# Patient Record
Sex: Female | Born: 1940
Health system: Southern US, Community
[De-identification: ages and names within clinical notes are randomized; demographics above are authoritative.]

## PROBLEM LIST (undated history)

## (undated) DIAGNOSIS — Z860101 Personal history of adenomatous and serrated colon polyps: Secondary | ICD-10-CM

## (undated) DIAGNOSIS — K5010 Crohn's disease of large intestine without complications: Secondary | ICD-10-CM

## (undated) DIAGNOSIS — Z85820 Personal history of malignant melanoma of skin: Secondary | ICD-10-CM

## (undated) DIAGNOSIS — M1990 Unspecified osteoarthritis, unspecified site: Secondary | ICD-10-CM

## (undated) DIAGNOSIS — T148XXA Other injury of unspecified body region, initial encounter: Secondary | ICD-10-CM

## (undated) DIAGNOSIS — K859 Acute pancreatitis without necrosis or infection, unspecified: Secondary | ICD-10-CM

## (undated) DIAGNOSIS — K625 Hemorrhage of anus and rectum: Secondary | ICD-10-CM

## (undated) DIAGNOSIS — H269 Unspecified cataract: Secondary | ICD-10-CM

## (undated) DIAGNOSIS — I779 Disorder of arteries and arterioles, unspecified: Secondary | ICD-10-CM

## (undated) DIAGNOSIS — D126 Benign neoplasm of colon, unspecified: Secondary | ICD-10-CM

## (undated) DIAGNOSIS — I219 Acute myocardial infarction, unspecified: Secondary | ICD-10-CM

## (undated) DIAGNOSIS — F32A Depression, unspecified: Secondary | ICD-10-CM

## (undated) DIAGNOSIS — J449 Chronic obstructive pulmonary disease, unspecified: Secondary | ICD-10-CM

## (undated) DIAGNOSIS — F329 Major depressive disorder, single episode, unspecified: Secondary | ICD-10-CM

## (undated) DIAGNOSIS — I2510 Atherosclerotic heart disease of native coronary artery without angina pectoris: Secondary | ICD-10-CM

## (undated) DIAGNOSIS — Z86010 Personal history of colonic polyps: Secondary | ICD-10-CM

## (undated) DIAGNOSIS — G459 Transient cerebral ischemic attack, unspecified: Secondary | ICD-10-CM

## (undated) DIAGNOSIS — J338 Other polyp of sinus: Secondary | ICD-10-CM

## (undated) DIAGNOSIS — I10 Essential (primary) hypertension: Secondary | ICD-10-CM

## (undated) DIAGNOSIS — I82409 Acute embolism and thrombosis of unspecified deep veins of unspecified lower extremity: Secondary | ICD-10-CM

## (undated) DIAGNOSIS — Z8619 Personal history of other infectious and parasitic diseases: Secondary | ICD-10-CM

## (undated) DIAGNOSIS — Z8719 Personal history of other diseases of the digestive system: Secondary | ICD-10-CM

## (undated) DIAGNOSIS — M810 Age-related osteoporosis without current pathological fracture: Secondary | ICD-10-CM

## (undated) DIAGNOSIS — E782 Mixed hyperlipidemia: Secondary | ICD-10-CM

## (undated) DIAGNOSIS — K760 Fatty (change of) liver, not elsewhere classified: Secondary | ICD-10-CM

## (undated) DIAGNOSIS — F419 Anxiety disorder, unspecified: Secondary | ICD-10-CM

## (undated) DIAGNOSIS — I739 Peripheral vascular disease, unspecified: Secondary | ICD-10-CM

## (undated) HISTORY — DX: Age-related osteoporosis without current pathological fracture: M81.0

## (undated) HISTORY — DX: Personal history of adenomatous and serrated colon polyps: Z86.0101

## (undated) HISTORY — DX: Acute embolism and thrombosis of unspecified deep veins of unspecified lower extremity: I82.409

## (undated) HISTORY — DX: Essential (primary) hypertension: I10

## (undated) HISTORY — DX: Personal history of malignant melanoma of skin: Z85.820

## (undated) HISTORY — DX: Other injury of unspecified body region, initial encounter: T14.8XXA

## (undated) HISTORY — PX: CATARACT EXTRACTION: SUR2

## (undated) HISTORY — DX: Hemorrhage of anus and rectum: K62.5

## (undated) HISTORY — DX: Fatty (change of) liver, not elsewhere classified: K76.0

## (undated) HISTORY — DX: Chronic obstructive pulmonary disease, unspecified: J44.9

## (undated) HISTORY — DX: Acute myocardial infarction, unspecified: I21.9

## (undated) HISTORY — DX: Personal history of other diseases of the digestive system: Z87.19

## (undated) HISTORY — DX: Other polyp of sinus: J33.8

## (undated) HISTORY — DX: Mixed hyperlipidemia: E78.2

## (undated) HISTORY — DX: Transient cerebral ischemic attack, unspecified: G45.9

## (undated) HISTORY — DX: Atherosclerotic heart disease of native coronary artery without angina pectoris: I25.10

## (undated) HISTORY — DX: Acute pancreatitis without necrosis or infection, unspecified: K85.90

## (undated) HISTORY — DX: Personal history of other infectious and parasitic diseases: Z86.19

## (undated) HISTORY — DX: Major depressive disorder, single episode, unspecified: F32.9

## (undated) HISTORY — DX: Crohn's disease of large intestine without complications: K50.10

## (undated) HISTORY — DX: Unspecified osteoarthritis, unspecified site: M19.90

## (undated) HISTORY — DX: Personal history of colonic polyps: Z86.010

## (undated) HISTORY — DX: Depression, unspecified: F32.A

## (undated) HISTORY — DX: Anxiety disorder, unspecified: F41.9

## (undated) HISTORY — DX: Benign neoplasm of colon, unspecified: D12.6

## (undated) HISTORY — DX: Peripheral vascular disease, unspecified: I73.9

## (undated) HISTORY — DX: Unspecified cataract: H26.9

## (undated) HISTORY — DX: Disorder of arteries and arterioles, unspecified: I77.9

---

## 1994-08-26 HISTORY — PX: OTHER SURGICAL HISTORY: SHX169

## 2002-07-12 ENCOUNTER — Other Ambulatory Visit: Admission: RE | Admit: 2002-07-12 | Discharge: 2002-07-12 | Payer: Self-pay | Admitting: Obstetrics & Gynecology

## 2003-06-20 ENCOUNTER — Inpatient Hospital Stay (HOSPITAL_COMMUNITY): Admission: AD | Admit: 2003-06-20 | Discharge: 2003-06-25 | Payer: Self-pay | Admitting: Cardiology

## 2003-06-21 ENCOUNTER — Encounter: Payer: Self-pay | Admitting: Cardiology

## 2003-06-22 ENCOUNTER — Encounter: Payer: Self-pay | Admitting: Cardiology

## 2004-09-19 ENCOUNTER — Ambulatory Visit: Payer: Self-pay | Admitting: Cardiology

## 2004-10-03 ENCOUNTER — Ambulatory Visit: Payer: Self-pay | Admitting: Internal Medicine

## 2004-10-11 ENCOUNTER — Ambulatory Visit: Payer: Self-pay | Admitting: Internal Medicine

## 2004-10-26 ENCOUNTER — Encounter: Admission: RE | Admit: 2004-10-26 | Discharge: 2004-10-26 | Payer: Self-pay | Admitting: Orthopaedic Surgery

## 2004-11-04 ENCOUNTER — Encounter: Admission: RE | Admit: 2004-11-04 | Discharge: 2004-11-04 | Payer: Self-pay | Admitting: Orthopaedic Surgery

## 2005-03-25 ENCOUNTER — Ambulatory Visit: Payer: Self-pay | Admitting: Cardiology

## 2005-04-17 ENCOUNTER — Ambulatory Visit: Payer: Self-pay | Admitting: Internal Medicine

## 2005-04-23 ENCOUNTER — Ambulatory Visit: Payer: Self-pay | Admitting: Cardiology

## 2005-05-02 ENCOUNTER — Ambulatory Visit: Payer: Self-pay | Admitting: Cardiology

## 2005-05-03 ENCOUNTER — Ambulatory Visit: Payer: Self-pay | Admitting: Cardiology

## 2005-05-08 ENCOUNTER — Ambulatory Visit: Payer: Self-pay | Admitting: Internal Medicine

## 2005-05-15 ENCOUNTER — Ambulatory Visit: Payer: Self-pay | Admitting: Cardiology

## 2005-05-15 ENCOUNTER — Ambulatory Visit: Payer: Self-pay | Admitting: Internal Medicine

## 2005-07-12 ENCOUNTER — Ambulatory Visit: Payer: Self-pay | Admitting: Cardiology

## 2005-11-20 ENCOUNTER — Ambulatory Visit: Payer: Self-pay | Admitting: Internal Medicine

## 2005-11-28 ENCOUNTER — Ambulatory Visit: Payer: Self-pay | Admitting: Cardiology

## 2006-05-07 ENCOUNTER — Ambulatory Visit: Payer: Self-pay | Admitting: Internal Medicine

## 2006-06-20 ENCOUNTER — Ambulatory Visit: Payer: Self-pay | Admitting: Internal Medicine

## 2006-07-28 ENCOUNTER — Ambulatory Visit: Payer: Self-pay | Admitting: Cardiology

## 2006-07-30 ENCOUNTER — Ambulatory Visit: Payer: Self-pay | Admitting: Internal Medicine

## 2006-08-12 ENCOUNTER — Ambulatory Visit: Payer: Self-pay

## 2006-10-07 ENCOUNTER — Ambulatory Visit: Payer: Self-pay | Admitting: Internal Medicine

## 2006-11-04 ENCOUNTER — Ambulatory Visit: Payer: Self-pay | Admitting: Internal Medicine

## 2006-12-31 ENCOUNTER — Ambulatory Visit: Payer: Self-pay | Admitting: Vascular Surgery

## 2007-01-21 ENCOUNTER — Ambulatory Visit: Payer: Self-pay | Admitting: Internal Medicine

## 2007-05-19 ENCOUNTER — Ambulatory Visit: Payer: Self-pay | Admitting: Internal Medicine

## 2007-05-19 LAB — CONVERTED CEMR LAB
Basophils Absolute: 0 10*3/uL (ref 0.0–0.1)
Eosinophils Relative: 0.9 % (ref 0.0–5.0)
HCT: 34.5 % — ABNORMAL LOW (ref 36.0–46.0)
MCHC: 35.1 g/dL (ref 30.0–36.0)
Monocytes Absolute: 0.1 10*3/uL — ABNORMAL LOW (ref 0.2–0.7)
Monocytes Relative: 1 % — ABNORMAL LOW (ref 3.0–11.0)
Neutro Abs: 10.2 10*3/uL — ABNORMAL HIGH (ref 1.4–7.7)
Neutrophils Relative %: 85.5 % — ABNORMAL HIGH (ref 43.0–77.0)
RBC: 3.96 M/uL (ref 3.87–5.11)
WBC: 11.9 10*3/uL — ABNORMAL HIGH (ref 4.5–10.5)

## 2007-06-10 ENCOUNTER — Ambulatory Visit: Payer: Self-pay | Admitting: Internal Medicine

## 2007-07-09 ENCOUNTER — Encounter: Payer: Self-pay | Admitting: Internal Medicine

## 2007-07-09 ENCOUNTER — Ambulatory Visit: Payer: Self-pay | Admitting: Internal Medicine

## 2007-07-30 ENCOUNTER — Ambulatory Visit: Payer: Self-pay | Admitting: Cardiology

## 2007-07-30 ENCOUNTER — Encounter: Payer: Self-pay | Admitting: Cardiology

## 2007-08-12 ENCOUNTER — Ambulatory Visit: Payer: Self-pay | Admitting: Internal Medicine

## 2007-08-13 ENCOUNTER — Encounter: Payer: Self-pay | Admitting: Cardiology

## 2007-08-14 ENCOUNTER — Ambulatory Visit: Payer: Self-pay | Admitting: Cardiology

## 2007-09-03 ENCOUNTER — Ambulatory Visit: Payer: Self-pay | Admitting: Cardiology

## 2007-09-09 ENCOUNTER — Ambulatory Visit: Payer: Self-pay | Admitting: Vascular Surgery

## 2007-10-27 DIAGNOSIS — G459 Transient cerebral ischemic attack, unspecified: Secondary | ICD-10-CM | POA: Insufficient documentation

## 2007-11-02 ENCOUNTER — Ambulatory Visit: Payer: Self-pay | Admitting: Internal Medicine

## 2008-01-28 ENCOUNTER — Ambulatory Visit: Payer: Self-pay | Admitting: Internal Medicine

## 2008-02-29 ENCOUNTER — Ambulatory Visit: Payer: Self-pay | Admitting: Cardiology

## 2008-03-09 ENCOUNTER — Ambulatory Visit: Payer: Self-pay | Admitting: Vascular Surgery

## 2008-05-11 ENCOUNTER — Ambulatory Visit: Payer: Self-pay | Admitting: Internal Medicine

## 2008-05-17 ENCOUNTER — Telehealth: Payer: Self-pay | Admitting: Internal Medicine

## 2008-08-02 ENCOUNTER — Telehealth: Payer: Self-pay | Admitting: Internal Medicine

## 2008-08-04 ENCOUNTER — Telehealth: Payer: Self-pay | Admitting: Internal Medicine

## 2008-09-21 ENCOUNTER — Encounter: Payer: Self-pay | Admitting: Cardiology

## 2008-09-21 ENCOUNTER — Ambulatory Visit: Payer: Self-pay | Admitting: Vascular Surgery

## 2008-10-06 ENCOUNTER — Encounter: Payer: Self-pay | Admitting: Cardiology

## 2008-10-19 ENCOUNTER — Encounter: Admission: RE | Admit: 2008-10-19 | Discharge: 2008-10-19 | Payer: Self-pay | Admitting: Rheumatology

## 2008-10-19 ENCOUNTER — Encounter: Payer: Self-pay | Admitting: Cardiology

## 2008-10-31 ENCOUNTER — Ambulatory Visit: Payer: Self-pay | Admitting: Cardiology

## 2008-10-31 ENCOUNTER — Encounter: Payer: Self-pay | Admitting: Cardiology

## 2008-10-31 DIAGNOSIS — F17200 Nicotine dependence, unspecified, uncomplicated: Secondary | ICD-10-CM | POA: Insufficient documentation

## 2008-10-31 DIAGNOSIS — I6523 Occlusion and stenosis of bilateral carotid arteries: Secondary | ICD-10-CM | POA: Insufficient documentation

## 2008-10-31 DIAGNOSIS — E782 Mixed hyperlipidemia: Secondary | ICD-10-CM | POA: Insufficient documentation

## 2008-11-23 ENCOUNTER — Encounter: Payer: Self-pay | Admitting: Internal Medicine

## 2008-12-07 ENCOUNTER — Telehealth: Payer: Self-pay | Admitting: Internal Medicine

## 2008-12-26 ENCOUNTER — Ambulatory Visit: Payer: Self-pay | Admitting: Internal Medicine

## 2008-12-26 LAB — CONVERTED CEMR LAB
Lymphs Abs: 1.9 10*3/uL (ref 0.7–4.0)
Monocytes Relative: 8.7 % (ref 3.0–12.0)
Neutro Abs: 3.5 10*3/uL (ref 1.4–7.7)
Neutrophils Relative %: 56.7 % (ref 43.0–77.0)
Platelets: 231 10*3/uL (ref 150.0–400.0)

## 2009-03-22 ENCOUNTER — Ambulatory Visit: Payer: Self-pay | Admitting: Vascular Surgery

## 2009-03-22 ENCOUNTER — Encounter (INDEPENDENT_AMBULATORY_CARE_PROVIDER_SITE_OTHER): Payer: Self-pay | Admitting: *Deleted

## 2009-04-21 ENCOUNTER — Telehealth: Payer: Self-pay | Admitting: Internal Medicine

## 2009-05-02 ENCOUNTER — Telehealth: Payer: Self-pay | Admitting: Internal Medicine

## 2009-05-02 ENCOUNTER — Ambulatory Visit: Payer: Self-pay | Admitting: Gastroenterology

## 2009-05-02 ENCOUNTER — Encounter: Payer: Self-pay | Admitting: Physician Assistant

## 2009-05-02 DIAGNOSIS — J449 Chronic obstructive pulmonary disease, unspecified: Secondary | ICD-10-CM

## 2009-05-03 LAB — CONVERTED CEMR LAB
AST: 25 units/L (ref 0–37)
Albumin: 3.7 g/dL (ref 3.5–5.2)
Basophils Absolute: 0 10*3/uL (ref 0.0–0.1)
Basophils Relative: 0.1 % (ref 0.0–3.0)
Creatinine, Ser: 1.6 mg/dL — ABNORMAL HIGH (ref 0.4–1.2)
Eosinophils Absolute: 0 10*3/uL (ref 0.0–0.7)
Eosinophils Relative: 0.7 % (ref 0.0–5.0)
HCT: 44.8 % (ref 36.0–46.0)
Lymphocytes Relative: 15 % (ref 12.0–46.0)
Lymphs Abs: 1.1 10*3/uL (ref 0.7–4.0)
MCV: 96.2 fL (ref 78.0–100.0)
Platelets: 199 10*3/uL (ref 150.0–400.0)
Potassium: 3.7 meq/L (ref 3.5–5.1)
RBC: 4.65 M/uL (ref 3.87–5.11)
RDW: 13 % (ref 11.5–14.6)
Sodium: 134 meq/L — ABNORMAL LOW (ref 135–145)
Total Protein: 7.8 g/dL (ref 6.0–8.3)
WBC: 7 10*3/uL (ref 4.5–10.5)

## 2009-05-11 ENCOUNTER — Ambulatory Visit (HOSPITAL_COMMUNITY): Admission: RE | Admit: 2009-05-11 | Discharge: 2009-05-11 | Payer: Self-pay | Admitting: Ophthalmology

## 2009-05-15 ENCOUNTER — Ambulatory Visit: Payer: Self-pay | Admitting: Internal Medicine

## 2009-06-07 ENCOUNTER — Ambulatory Visit: Payer: Self-pay | Admitting: Cardiology

## 2009-06-07 DIAGNOSIS — I2510 Atherosclerotic heart disease of native coronary artery without angina pectoris: Secondary | ICD-10-CM

## 2009-06-08 ENCOUNTER — Ambulatory Visit (HOSPITAL_COMMUNITY): Admission: RE | Admit: 2009-06-08 | Discharge: 2009-06-08 | Payer: Self-pay | Admitting: Ophthalmology

## 2009-06-20 ENCOUNTER — Encounter: Payer: Self-pay | Admitting: Cardiology

## 2009-06-26 ENCOUNTER — Encounter (INDEPENDENT_AMBULATORY_CARE_PROVIDER_SITE_OTHER): Payer: Self-pay | Admitting: *Deleted

## 2009-09-18 ENCOUNTER — Ambulatory Visit: Payer: Self-pay | Admitting: Vascular Surgery

## 2009-09-18 ENCOUNTER — Telehealth: Payer: Self-pay | Admitting: Internal Medicine

## 2009-10-04 ENCOUNTER — Telehealth (INDEPENDENT_AMBULATORY_CARE_PROVIDER_SITE_OTHER): Payer: Self-pay | Admitting: *Deleted

## 2009-11-13 ENCOUNTER — Telehealth (INDEPENDENT_AMBULATORY_CARE_PROVIDER_SITE_OTHER): Payer: Self-pay | Admitting: *Deleted

## 2009-11-30 ENCOUNTER — Encounter: Payer: Self-pay | Admitting: Cardiology

## 2009-12-05 ENCOUNTER — Encounter (INDEPENDENT_AMBULATORY_CARE_PROVIDER_SITE_OTHER): Payer: Self-pay | Admitting: *Deleted

## 2009-12-06 ENCOUNTER — Ambulatory Visit: Payer: Self-pay | Admitting: Cardiology

## 2009-12-26 ENCOUNTER — Telehealth: Payer: Self-pay | Admitting: Internal Medicine

## 2010-04-25 ENCOUNTER — Ambulatory Visit: Payer: Self-pay | Admitting: Vascular Surgery

## 2010-04-26 ENCOUNTER — Encounter: Payer: Self-pay | Admitting: Cardiology

## 2010-05-02 ENCOUNTER — Telehealth: Payer: Self-pay | Admitting: Internal Medicine

## 2010-05-04 ENCOUNTER — Encounter (INDEPENDENT_AMBULATORY_CARE_PROVIDER_SITE_OTHER): Payer: Self-pay | Admitting: *Deleted

## 2010-05-31 ENCOUNTER — Encounter: Payer: Self-pay | Admitting: Cardiology

## 2010-06-05 ENCOUNTER — Encounter (INDEPENDENT_AMBULATORY_CARE_PROVIDER_SITE_OTHER): Payer: Self-pay | Admitting: *Deleted

## 2010-06-05 ENCOUNTER — Ambulatory Visit: Payer: Self-pay | Admitting: Cardiology

## 2010-06-07 ENCOUNTER — Encounter: Payer: Self-pay | Admitting: Cardiology

## 2010-06-07 ENCOUNTER — Ambulatory Visit: Payer: Self-pay | Admitting: Cardiology

## 2010-06-13 ENCOUNTER — Ambulatory Visit: Payer: Self-pay | Admitting: Internal Medicine

## 2010-06-28 ENCOUNTER — Ambulatory Visit: Payer: Self-pay | Admitting: Cardiology

## 2010-08-01 ENCOUNTER — Telehealth: Payer: Self-pay | Admitting: Internal Medicine

## 2010-09-26 ENCOUNTER — Encounter: Payer: Self-pay | Admitting: Cardiology

## 2010-09-26 HISTORY — PX: TOTAL KNEE ARTHROPLASTY: SHX125

## 2010-09-27 NOTE — Assessment & Plan Note (Signed)
Summary: f/u//med refills--ch.    History of Present Illness Visit Type: Follow-up Visit Primary GI MD: Delfin Edis MD Primary Provider: Dr. Matthias Hughs Requesting Provider: n/a Chief Complaint: F/u for crohn's/colitis. Pt denies any GI complaints  History of Present Illness:   This is a 70 year old white female with Crohn's disease since 1998 involving her colon. Her last colonoscopy in November 2008 showed colitis and an adenomatous polyp in the left colon. She had gastroenteritis in September 2010 which responded to Flagyl and Cipro. Patient is allergic to 6 MP. She has coronary artery disease on  Plavix. She also has internal carotid disease. She denies any diarrhea, abdominal pain or rectal bleeding. She is currently on sulfasalazine 500 mg 3 tablets twice a day and folic acid 1 mg a day. She takes Robinul Forte 2 mg p.r.n. cramps.   GI Review of Systems      Denies abdominal pain, acid reflux, belching, bloating, chest pain, dysphagia with liquids, dysphagia with solids, heartburn, loss of appetite, nausea, vomiting, vomiting blood, weight loss, and  weight gain.        Denies anal fissure, black tarry stools, change in bowel habit, constipation, diarrhea, diverticulosis, fecal incontinence, heme positive stool, hemorrhoids, irritable bowel syndrome, jaundice, light color stool, liver problems, rectal bleeding, and  rectal pain.    Current Medications (verified): 1)  Aspirin 81 Mg  Tbec (Aspirin) .... .qdtab 2)  Plavix 75 Mg  Tabs (Clopidogrel Bisulfate) .... Take 1 Tablet By Mouth Once A Day 3)  Toprol Xl 25 Mg  Tb24 (Metoprolol Succinate) .... Take 1 Tablet By Mouth Once A Day 4)  Crestor 5 Mg Tabs (Rosuvastatin Calcium) .... One Tablet By Mouth Once Daily 5)  Folic Acid 1 Mg Tabs (Folic Acid) .... Take 1 Tablet By Mouth Once A Day. 6)  Fluoxetine Hcl 40 Mg Caps (Fluoxetine Hcl) .... One Tablet By Mouth Once Daily 7)  Caltrate 600+d Plus 600-400 Mg-Unit  Chew (Calcium  Carbonate-Vit D-Min) .... Once Daily 8)  Vitamin B-6 100 Mg  Tabs (Pyridoxine Hcl) .... Once Daily 9)  Sulfasalazine 500 Mg Tbec (Sulfasalazine) .... Take 3 Tablets By Mouth Two Times A Day (Pharmacy-Please D/c Prescription For Asacol) 10)  Hydrocodone-Acetaminophen 10-650 Mg Tabs (Hydrocodone-Acetaminophen) .... 2-4 By Mouth Once Daily 11)  Fish Oil Double Strength 1200 Mg Caps (Omega-3 Fatty Acids) .... Take 1 Tablet By Mouth Once A Day 12)  Celebrex 200 Mg Caps (Celecoxib) .... Take 1 Tablet By Mouth Once A Day As Needed Bad Days Only 13)  Alprazolam 0.5 Mg Tabs (Alprazolam) .... Take 1/2-1 Tablet By Mouth At Bedtime As Needed 14)  Osteo Bi-Flex Adv Triple St  Tabs (Misc Natural Products) .... Take 1 Tablet By Mouth Once A Day 15)  Nitrostat 0.4 Mg Subl (Nitroglycerin) .Marland Kitchen.. 1 Tablet Under Tongue At Onset of Chest Pain; You May Repeat Every 5 Minutes For Up To 3 Doses. 16)  Robinul-Forte 2 Mg Tabs (Glycopyrrolate) .... As Needed  Allergies (verified): 1)  ! * Mercaptopurine 2)  ! Penicillin  Past History:  Past Medical History: Osteoarthritis Sinus polyp C O P D Colonic polyps Hyperlipidemia Carotid disease - 77-41% RICA and LICA (2/87), Dr. Oneida Alar Myocardial Infarction - IMI 10/04 SALMONELLA INFECTION (ICD-003.9) CROHN'S DISEASE-LARGE INTESTINE (ICD-555.1) RECTAL BLEEDING (ICD-569.3) DIARRHEA (ICD-787.91) ABDOMINAL PAIN -GENERALIZED (ICD-789.07) NAUSEA ALONE (ICD-787.02) TOBACCO ABUSE (ICD-305.1) CAROTID ARTERY DISEASE (ICD-433.10) Hx of CVA (STROKE) (ICD-434.91) PANCREATITIS, ACUTE, HX OF (ICD-V12.70) COLONIC POLYPS (ICD-211.3) FATTY LIVER DISEASE (ICD-571.8) RECTAL BLEEDING (ICD-569.3) CAD (ICD-414.00)  POLYARTHRALGIA (ICD-719.49) APHTHOUS ULCERS (ICD-528.2) COLITIS (ICD-558.9) OSTEOARTHRITIS (ICD-715.90) TIA (ICD-435.9) Hypertension  Past Surgical History: Reviewed history from 12/06/2009 and no changes required. Melanoma resection - 1996 Cataract Surgery - left  Eye  Family History: Reviewed history from 12/06/2009 and no changes required. Family History of Diabetes: Mother No FH of Colon Cancer  Social History: Retired Alcohol Use - no Daily Caffeine Use Tobacco Use - Yes.   Review of Systems       The patient complains of arthritis/joint pain.  The patient denies allergy/sinus, anemia, anxiety-new, back pain, blood in urine, breast changes/lumps, change in vision, confusion, cough, coughing up blood, depression-new, fainting, fatigue, fever, headaches-new, hearing problems, heart murmur, heart rhythm changes, itching, menstrual pain, muscle pains/cramps, night sweats, nosebleeds, pregnancy symptoms, shortness of breath, skin rash, sleeping problems, sore throat, swelling of feet/legs, swollen lymph glands, thirst - excessive , urination - excessive , urination changes/pain, urine leakage, vision changes, and voice change.         Pertinent positive and negative review of systems were noted in the above HPI. All other ROS was otherwise negative.   Vital Signs:  Patient profile:   70 year old female Height:      66 inches Weight:      236 pounds BMI:     38.23 BSA:     2.15 Pulse rate:   64 / minute Pulse rhythm:   regular BP sitting:   110 / 60  (left arm) Cuff size:   regular  Vitals Entered By: Hope Pigeon CMA (June 13, 2010 1:58 PM)  Physical Exam  Eyes:  PERRLA, no icterus. Mouth:  No deformity or lesions, dentition normal.   Impression & Recommendations:  Problem # 1:  CROHN'S DISEASE-LARGE INTESTINE (ICD-555.1) Patient has Crohn's colitis in symptomatic remission. She is to continue sulfasalazine 3 g a day and folic acid 1 mg a day. We will refill her Robinul Forte 2 mg p.r.n. A recall colonoscopy will be due in November 2013. Start Diflucan 100 mg p.o. q.d. for vaginal itching. Pyridium 100 mg 3 times a day for 2 days for urinary frequency  Problem # 2:  SALMONELLA INFECTION (ICD-003.9) This is not an active  problem.  Problem # 3:  PANCREATITIS, ACUTE, HX OF (ICD-V12.70) pancreatitis was caused  by 6-MP. She no longer takes it  Patient Instructions: 1)  Please pick up your diflucan 100 mg 1 tablet daily x 3 days. 2)  Pick up Pyridium 100 mg. Take 1 tablet by mouth three times a day x 3 days. 3)  Pick up your sulfasalazine. We have given you 10 refills. 4)  We have given you a prescription to take to your pharmacy for glycopyrolate. 5)  The medication list was reviewed and reconciled.  All changed / newly prescribed medications were explained.  A complete medication list was provided to the patient / caregiver. Prescriptions: PYRIDIUM 100 MG TABS (PHENAZOPYRIDINE HCL) Take 1 tablet by mouth three times a day x 3 days  #9 tablets x 0   Entered by:   Madlyn Frankel CMA (AAMA)   Authorized by:   Lafayette Dragon MD   Signed by:   Madlyn Frankel CMA ([place]) on 06/13/2010   Method used:   Electronically to        [facility] (retail)       [address]       [place], [place]  [address]  Ph: [phone]       Fax: [phone]   RxID:   [id number] DIFLUCAN 100 MG TABS (FLUCONAZOLE) Take 1 tablet by mouth once a day x 3 days  #3 x 0   Entered by:   Madlyn Frankel CMA (AAMA)   Authorized by:   Lafayette Dragon MD   Signed by:   Madlyn Frankel CMA ([place]) on 06/13/2010   Method used:   Electronically to        [facility] (retail)       [address]       [place], [place]  [address]       Ph: [phone]       Fax: [phone]   RxID:   [id number] ROBINUL-FORTE 2 MG TABS (GLYCOPYRROLATE) Take 1 tablet by mouth two times a day  #60 x 2   Entered by:   Madlyn Frankel CMA ([place])   Authorized by:   Lafayette Dragon MD   Signed by:   Madlyn Frankel CMA ([place]) on 06/13/2010   Method used:   Print then Give to Patient   RxID:   [id number] SULFASALAZINE 500 MG TBEC (SULFASALAZINE) Take 3 tablets by mouth two times a day   #180 x 10   Entered by:   Madlyn Frankel CMA ([place])   Authorized by:   Lafayette Dragon MD   Signed by:   Madlyn Frankel CMA ([place]) on 06/13/2010   Method used:   Electronically to        [facility] (retail)       [address]       [place], [place]  [address]       Ph: [phone]       Fax: [phone]   RxID:   [id number]

## 2010-09-27 NOTE — Assessment & Plan Note (Signed)
Summary: 6 MO FU PER APRIL REMINDER-SRS      Allergies Added:   Visit Type:  Follow-up Primary Provider:  Dr. Matthias Hughs   History of Present Illness: 70 year old Shelley [name] presents for a followup visit. She reports no significant angina. She continues to smoke cigarettes and we talked about smoking cessation strategies today.  Followup labs from 7 April reveal AST 19, ALT 12, total cholesterol 150, triglycerides104, LDL 59, HDL 70. She is tolerating low-dose Crestor.  Last ischemic assessment was via adenosine Cardiolite in December 2008, revealing LVEF Shelley% with normal wall motion, medium partially reversible apical to basal inferior defect consistent with scar and peri-infarct ischemia.  She does report recent trouble with cold and allergies. Otherwise no unusual shortness of breath.  Preventive Screening-Counseling & Management  Alcohol-Tobacco     Smoking Status: current     Smoking Cessation Counseling: yes     Packs/Day: <1/2 PPD  Current Medications (verified): 1)  Aspirin 81 Mg  Tbec (Aspirin) .... .qdtab 2)  Plavix 75 Mg  Tabs (Clopidogrel Bisulfate) .... Take 1 Tablet By Mouth Once A Day 3)  Toprol Xl 25 Mg  Tb24 (Metoprolol Succinate) .... Take 1 Tablet By Mouth Once A Day 4)  Crestor 10 Mg Tabs (Rosuvastatin Calcium) .... Take One Tablet By Mouth Every Other Day 5)  Folic Acid 1 Mg Tabs (Folic Acid) .... Take 1 Tablet By Mouth Once A Day. 6)  Fluoxetine Hcl 40 Mg Caps (Fluoxetine Hcl) .... One Tablet By Mouth Once Daily 7)  Caltrate 600+d Plus 600-400 Mg-Unit  Chew (Calcium Carbonate-Vit D-Min) .... Once Daily 8)  Vitamin B-6 100 Mg  Tabs (Pyridoxine Hcl) .... Once Daily 9)  Sulfasalazine 500 Mg Tabs (Sulfasalazine) .... Take 3 Tablets By Mouth Twice A Day 10)  Hydrocodone-Acetaminophen 10-650 Mg Tabs (Hydrocodone-Acetaminophen) .... 2-4 By Mouth Once Daily 11)  Fish Oil 1000 Mg Caps (Omega-3 Fatty Acids) .... Take 1 Tablet By Mouth Once A Day 12)  Celebrex 200 Mg Caps  (Celecoxib) .... Take 1 Tablet By Mouth Once A Day 13)  Alprazolam 0.5 Mg Tabs (Alprazolam) .... Take 1/2-1 Tablet By Mouth At Bedtime As Needed  Allergies (verified): 1)  ! * Mercaptopurine 2)  ! Penicillin  Comments:  Nurse/Medical Assistant: The patient is currently on medications but does not know the name or dosage at this time. Instructed to contact our office with details. Will update medication list at that time. Patient stated all meds are the same.  Past History:  Social History: Last updated: 12/06/2009 Alcohol Use - no Daily Caffeine Use Tobacco Use - Yes.   Past Medical History: CAD - DES RCA 10/04 Crohn's colitis Osteoarthritis Previous TIA Sinus polyp C O P D Colonic polyps Hyperlipidemia Carotid disease - 56-43% RICA and LICA (3/29), Dr. Oneida Alar Myocardial Infarction - IMI 10/04  Past Surgical History: Melanoma resection - 1996 Cataract Surgery - left Eye  Family History: Family History of Diabetes: Mother No FH of Colon Cancer  Social History: Alcohol Use - no Daily Caffeine Use Tobacco Use - Yes.  Packs/Day:  <1/2 PPD  Review of Systems  The patient denies anorexia, fever, chest pain, syncope, peripheral edema, prolonged cough, melena, and hematochezia.         Otherwise reviewed and negative.  Vital Signs:  Patient profile:   70 year old female Height:      66 inches Weight:      236 pounds O2 Sat:      96 % Pulse rate:  60 / minute BP sitting:   123 / 73  (left arm) Cuff size:   large  Vitals Entered By: Georgina Peer (December 06, 2009 2:28 PM)  Physical Exam  Additional Exam:  Comfortable in no acute distress. HEENT: Conjuctivae and lids normal, oropharynx clear with moist mucosa. Neck: Supple, no elevated JVP, soft right carotid bruit, no thyromegaly or tenderness. Lungs: Nonlabored breathing at rest. CTA without rales or wheezes. Cor: PMI nondisplaced. RRR, normal S1/S2. No pathologic systolic murmurs. No S3 or rub. Ext: No  CCE. Distal pulses 2+.  Skin: Warm and dry. Musculoskeletal: No gross deformities. Neuropsychiatric: Alert and oriented x3, affect appropriate.   EKG  Procedure date:  12/06/2009  Findings:      Normal sinus rhythm at 60 beats per minute with evidence of previous inferior wall infarct.  Impression & Recommendations:  Problem # 1:  CORONARY ATHEROSCLEROSIS, NATIVE VESSEL (ICD-414.01)  Symptomatically stable on medical therapy. Will continue observation, and plan followup in 6 months.  Her updated medication list for this problem includes:    Aspirin 81 Mg Tbec (Aspirin) ..... Marland Kitchenqdtab    Plavix 75 Mg Tabs (Clopidogrel bisulfate) .Marland Kitchen... Take 1 tablet by mouth once a day    Toprol Xl 25 Mg Tb24 (Metoprolol succinate) .Marland Kitchen... Take 1 tablet by mouth once a day  Orders: EKG w/ Interpretation (93[address])  Problem # 2:  CAROTID ARTERY DISEASE (ICD-433.10)  Follow with Dr. Oneida Alar, overall moderate.  Her updated medication list for this problem includes:    Aspirin 81 Mg Tbec (Aspirin) ..... Marland Kitchenqdtab    Plavix 75 Mg Tabs (Clopidogrel bisulfate) .Marland Kitchen... Take 1 tablet by mouth once a day  Problem # 3:  TOBACCO ABUSE (ICD-305.1)  We discussed smoking cessation today.  Problem # 4:  MIXED HYPERLIPIDEMIA (ICD-272.2)  LDL is at goal with normal liver function tests on low-dose Crestor. The patient is tolerating this well.  Her updated medication list for this problem includes:    Crestor 10 Mg Tabs (Rosuvastatin calcium) .Marland Kitchen... Take one tablet by mouth every other day  Patient Instructions: 1)  Labs:  FLP/LFT just before next visit  2)  Follow up in  6 months

## 2010-09-27 NOTE — Progress Notes (Signed)
Summary: Medication   Phone Note Call from Patient Call back at Home Phone [phone]   Caller: Patient Call For: Dr. [name] Reason for Call: Talk to Nurse Summary of Call: Needs her Folic Acid sent to [facility] Initial call taken by: Webb Laws,  August 01, 2010 11:07 AM  Follow-up for Phone Call        prescription sent. Follow-up by: Madlyn Frankel CMA (AAMA),  August 01, 2010 11:18 AM    Prescriptions: FOLIC ACID 1 MG TABS (FOLIC ACID) Take 1 tablet by mouth once a day.  #90 x 1   Entered by:   Madlyn Frankel CMA (AAMA)   Authorized by:   Lafayette Dragon MD   Signed by:   [place] ([place]) on 08/01/2010   Method used:   Electronically to        [facility] (retail)       [address]       [place], [place]  [address]       Ph: [phone]       Fax: [phone]   RxID:   [id number]

## 2010-09-27 NOTE — Assessment & Plan Note (Signed)
Summary: 3 WK F/U PER REMINDER-JM    Visit Type:  Follow-up Primary Provider:  Dr. Matthias Hughs   History of Present Illness: 70 year old woman presents for followup. I saw her back in October. Followup stress testing was arranged, outlined below. She has evidence of inferior wall scar consistent with her previous infarct, although no progressive ischemia. LVEF 50%. Discussed this today.  At this point would recommend continued medical therapy and observation for symptom control. We talked about trying to exercise although she is limited with arthritic pain. No other changes at this time.  Preventive Screening-Counseling & Management  Alcohol-Tobacco     Smoking Status: current     Smoking Cessation Counseling: yes     Packs/Day: 1/2 PPD  Allergies: 1)  ! * Mercaptopurine 2)  ! Penicillin  Past History:  Social History: Last updated: 06/13/2010 Retired Alcohol Use - no Daily Caffeine Use Tobacco Use - Yes.   Past Medical History: Osteoarthritis Sinus polyp C O P D Colonic polyps Hyperlipidemia Carotid disease - 16-10% RICA and LICA (9/60), Dr. Oneida Alar Myocardial Infarction - IMI 10/04 Crohn's disease History of rectal bleeding History of pancreatitis Colonic polyps Fatty liver disease Aphthous ulcers TIA Hypertension  Review of Systems  The patient denies anorexia, fever, weight loss, chest pain, syncope, dyspnea on exertion, melena, and hematochezia.         Otherwise reviewed and negative except as outlined.  Vital Signs:  Patient profile:   70 year old female Height:      66 inches Weight:      237 pounds Pulse rate:   59 / minute BP sitting:   114 / 69  (left arm) Cuff size:   large  Vitals Entered By: Georgina Peer (June 28, 2010 9:40 AM)  Physical Exam  Additional Exam:  Comfortable in no acute distress. HEENT: Conjuctivae and lids normal, oropharynx clear with moist mucosa. Neck: Supple, no elevated JVP, soft right carotid bruit, no  thyromegaly or tenderness. Lungs: Nonlabored breathing at rest. CTA without rales or wheezes. Cor: PMI nondisplaced. RRR, normal S1/S2. No pathologic systolic murmurs. No S3 or rub. Ext: No CCE. Distal pulses 2+.  Skin: Warm and dry. Musculoskeletal: No gross deformities. Neuropsychiatric: Alert and oriented x3, affect appropriate.   Nuclear Study  Procedure date:  06/07/2010  Findings:      Lexiscan Cardiolite without diagnostic ST segment changes. LVEF 50% with global hypokinesis. Fixed mid to basal inferior defect consistent with scar, although no frank ischemia.  Impression & Recommendations:  Problem # 1:  CORONARY ATHEROSCLEROSIS, NATIVE VESSEL (ICD-414.01)  Plan to continue medical therapy at this point, with recent Cardiolite showing no active ischemia. Followup scheduled for 6 months.  Her updated medication list for this problem includes:    Aspirin 81 Mg Tbec (Aspirin) ..... Marland Kitchenqdtab    Plavix 75 Mg Tabs (Clopidogrel bisulfate) .Marland Kitchen... Take 1 tablet by mouth once a day    Toprol Xl 25 Mg Tb24 (Metoprolol succinate) .Marland Kitchen... Take 1 tablet by mouth once a day    Nitrostat 0.4 Mg Subl (Nitroglycerin) .Marland Kitchen... 1 tablet under tongue at onset of chest pain; you may repeat every 5 minutes for up to 3 doses.  Problem # 2:  HYPERLIPIDEMIA (AVW-098.4)  Continue Crestor.  Her updated medication list for this problem includes:    Crestor 5 Mg Tabs (Rosuvastatin calcium) ..... One tablet by mouth once daily  Problem # 3:  TOBACCO ABUSE (ICD-305.1)  Continue to discuss smoking cessation.  Patient Instructions: 1)  Your physician wants you to follow-up in: 6 months. You will receive a reminder letter in the mail one-two months in advance. If you don't receive a letter, please call our office to schedule the follow-up appointment. 2)  Your physician recommends that you continue on your current medications as directed. Please refer to the Current Medication list given to you  today. Prescriptions: TOPROL XL 25 MG  TB24 (METOPROLOL SUCCINATE) Take 1 tablet by mouth once a day  #30 x 11   Entered by:   Gurney Maxin, RN, BSN   Authorized by:   Beckie Salts, MD, [facility]   Signed by:   Gurney Maxin, RN, BSN on 06/28/2010   Method used:   Electronically to        [facility] (retail)       [address]       [place], [place]  [address]       Ph: [phone]       Fax: [phone]   RxID:   [id number]

## 2010-09-27 NOTE — Progress Notes (Signed)
Summary: Medication refill  Medications Added FOLIC ACID 1 MG TABS (FOLIC ACID) Take 1 tablet by mouth once a day. SULFASALAZINE 500 MG TBEC (SULFASALAZINE) Take 2 tablets by mouth two times a day (pharmacy-please d/c prescription for asacol)       Phone Note Call from Patient Call back at [facility] Phone [phone]   Caller: Patient Call For: Dr. [name] Reason for Call: Refill Medication Summary of Call: Needs refills sent to 2 diffrent pharmacies...Marland KitchenMarland KitchenFolic Acid [facility] [place] [phone]....Marland KitchenMarland KitchenSulfasalazine 519[phone]10 Initial call taken by: CWebb Laws  May 02, 2010 12:01 PM  Follow-up for Phone Call        Dr BOlevia Perches Patient requests refills on her sulfasalazine and folic acid. However, she called back in May to say that she was having a rash from the sulfasalazine so we switched her to asacol and had her stop the folic acid. She states that she has since been to the dermatologist who does not think that the rash was from the sulfasalazine but rather from "making too many antihistamines in the body." She states she was given a topical medication and the rash resolved soon after. She is scheduled for an office visit on 05-30-10. She would like to switch back to sulfasalazine due to the cost. Are you comfortable with her switching back or would you like to see her in the office first to discuss? Follow-up by: DMadlyn FrankelCMA (Deborra Medina,  May 02, 2010 1:43 PM  Additional Follow-up for Phone Call Additional follow up Details #1::        It is OK for her to switch back to Folic acid 1 mg by mouth once daily, azulfadine 500 mg 2 by mouth two times a day  Additional Follow-up by: DLafayette DragonMD,  May 02, 2010 5:10 PM    Additional Follow-up for Phone Call Additional follow up Details #2::    Prescriptions sent to pharmacies of patient choice. Follow-up by: DMadlyn FrankelCMA (AAMA),  May 03, 2010 8:25  AM  New/Updated Medications: FOLIC ACID 1 MG TABS (FOLIC ACID) Take 1 tablet by mouth once a day. SULFASALAZINE 500 MG TBEC (SULFASALAZINE) Take 2 tablets by mouth two times a day (pharmacy-please d/c prescription for asacol) Prescriptions: SULFASALAZINE 500 MG TBEC (SULFASALAZINE) Take 2 tablets by mouth two times a day (pharmacy-please d/c prescription for asacol)  #120 x 0   Entered by:   DMadlyn FrankelCMA (AAMA)   Authorized by:   DLafayette DragonMD   Signed by:   DViera [place](AC[place] on 05/03/2010   Method used:   [facility]        [facility]       [address] [place]  [address]      Ph: [phone]      Fax: [phone]  RxID:   [id number] ACID 1 MG TABS (FOLIC ACID) Take 1 tablet by mouth once a day.  #90 x 0   Entered by:   DMadlyn FrankelCMA (AAMA)   Authorized by:   DLafayette DragonMD   Signed by:   DHutch[place](AB[place] on 05/03/2010   Method used:   [facility]        [facility]       [address]  [place], [place]  [address]       Ph: [phone]       Fax: [phone]   RxID:   [id number]

## 2010-09-27 NOTE — Progress Notes (Signed)
Summary: Unable to afford Crestor  Medications Added CRESTOR 10 MG TABS (ROSUVASTATIN CALCIUM) Take one tablet by mouth every other day       Phone Note Call from Patient Call back at [facility] Phone [phone]   Summary of Call: Spoke with pt who is asking for prescription for Crestor 57m once daily be called into her pharmacy. She states she take 1/4 tablet daily. Notified pt that our records indicate she is taking 560monce daily so we will not be able to send in prescription for 2046mablets with instructions to take daily. Pt states "I won't take it then." She states she has tried simvastatin in the past but wasn't able to take this. She states, however, that she cannot remember why she was unable to take the simvastatin. She has Medicare and will not qualify for the Lipitor $4 co-pay card. Initial call taken by: JenGurney MaxinN, BSN,  October 04, 2009 11:19 AM  Follow-up for Phone Call        Could she take Crestor 10 mg every other day?  We could write for that. Follow-up by: SamBeckie SaltsD, FACNorth Adams Regional HospitalFebruary  9, 2011 11:26 AM  Additional Follow-up for Phone Call Additional follow up Details #1::        Left message to call back on machine. JenGurney MaxinN, BSN  October 05, 2009 11:56 AM Pt notified and agreeable.  Additional Follow-up by: JenGurney MaxinN, BSN,  October 05, 2009 4:35 PM    New/Updated Medications: CRESTOR 10 MG TABS (ROSUVASTATIN CALCIUM) Take one tablet by mouth every other day Prescriptions: CRESTOR 10 MG TABS (ROSUVASTATIN CALCIUM) Take one tablet by mouth every other day  #15 x 6   Entered by:   JenGurney MaxinN, BSN   Authorized by:   SamBeckie SaltsD, FACHeart Of The Rockies Regional Medical CenterSigned by:   JenGurney MaxinN, BSN on 10/05/2009   Method used:   [facility]        [facility]       [id number]    [place]  [id number]    Ph: [phone]    Fax: [phone]:    [id number]

## 2010-09-27 NOTE — Progress Notes (Signed)
Summary: Question regarding labs   Phone Note Call from Patient Call back at Home Phone [phone]   Summary of Call: Pt left message on voicemail asking for return call stating she has appt with Dr. Domenic Polite on 4/13. She states he usually wants labs done before her appts and wants to know if she needs to do this. Last FLP/LFT done in October. She states she is only occasionally taking her Crestor. Notified pt to have FLP/LFT done a couple of days before her appt with Dr. Domenic Polite. Pt verbalized understanding.  Initial call taken by: Gurney Maxin, RN, BSN,  November 13, 2009 4:27 PM

## 2010-09-27 NOTE — Miscellaneous (Signed)
Summary: Orders Update  Clinical Lists Changes  Orders: Added new Test order of T-Lipid Profile [phone]) - Signed Added new Test order of T-Hepatic Function [phone]) - Signed

## 2010-09-27 NOTE — Progress Notes (Signed)
Summary: TRIAGE-RASH/ITCHING   Phone Note Call from Patient Call back at Home Phone [phone]   Caller: Patient Call For: Dr. [name] Reason for Call: Talk to Nurse Summary of Call: pt thinks she is having a reaction to Sulfasalazine... would like to be switched back to Asacol Initial call taken by: Lucien Mons,  Dec 26, 2009 9:59 AM  Follow-up for Phone Call        Last OV 05-15-09. Has been on Sulfasalazine "For a long time" Since last week she has a rash and itching on her arms. Pt. feels this is a reaction to the Sulfasalazine, states the same thing happened several years ago. Wants to be switched to Asacol.  [place] PLEASE ADVISE  Follow-up by: Vivia Ewing LPN,  Dec 26, 628 16:01 AM  Additional Follow-up for Phone Call Additional follow up Details #1::        OK, Start Asacal 443m, #240, 2 by mouth three times a day or 3 by mouth two times a day, 6 refills. She can stop taking the Folic acid. Additional Follow-up by: DLafayette DragonMD,  Dec 26, 2009 12:47 PM     Appended Document: Med Update Above MD orders reviewed with patient. Pt. instructed to call back as needed.    Clinical Lists Changes  Medications: Changed medication from SULFASALAZINE 500 MG TABS (SULFASALAZINE) Take 3 tablets by mouth twice a day to ASACOL 400 MG  TBEC (MESALAMINE) Take 2 three times daily or 3 two times daily. - Signed Rx of ASACOL 400 MG  TBEC (MESALAMINE) Take 2 three times daily or 3 two times daily.;  #240 x 6;  Signed;  Entered by: DVivia EwingLPN;  Authorized by: DLafayette DragonMD;  Method used: [facility] [place] [facility] [address] [place]  [address] Ph: [phone] Fax: [phone]   Prescriptions: ASACOL 400 MG  TBEC (MESALAMINE) Take 2 three times daily or 3 two times daily.  #240 x 6   Entered by:   DVivia EwingLPN   Authorized by:   DLafayette DragonMD   Signed by:   DVivia EwingLPN on 031/51/7616  Method used:   Electronically to         [facility]       [address]      [place]  [address]      Ph: [phone]      Fax: [phone]  RxID:   [id number]

## 2010-09-27 NOTE — Progress Notes (Signed)
Summary: refill several meds  Medications Added FOLIC ACID 1 MG TABS (FOLIC ACID) Take 1 tablet by mouth once a day. CELEBREX 200 MG CAPS (CELECOXIB) Take 1 tablet by mouth once a day ALPRAZOLAM 0.5 MG TABS (ALPRAZOLAM) Take 1/2-1 tablet by mouth at bedtime as needed       Phone Note Call from Patient Call back at [facility] Phone [phone]   Caller: Patient Call For: Dr. [name] Reason for Call: Refill Medication, Talk to Nurse Summary of Call: pt would like refills on these four drugs... Folic Acid 40m - [facility] [place] Celebrex  2046m - [facility] Alprazolam - [facility] celebrex - [facility] pt wanted to be sure that the correct pharmacies were called regarding the correct drug... [facility]  pt also wanted to know if Dr. BrOlevia Perchesants to see her in February, that Dr. BrOlevia Perchessually sees her every 6 months Initial call taken by: AlLucien Mons September 18, 2009 9:15 AM  Follow-up for Phone Call        Per Dr BrOlevia Perchesokay to send all medications. Follow-up by: DoAwilda BillMA (ADeborra Medina  September 18, 2009 9:37 AM    New/Updated Medications: FOLIC ACID 1 MG TABS (FOLIC ACID) Take 1 tablet by mouth once a day. CELEBREX 200 MG CAPS (CELECOXIB) Take 1 tablet by mouth once a day ALPRAZOLAM 0.5 MG TABS (ALPRAZOLAM) Take 1/2-1 tablet by mouth at bedtime as needed Prescriptions: ALPRAZOLAM 0.5 MG TABS (ALPRAZOLAM) Take 1/2-1 tablet by mouth at bedtime as needed  #30 x 1   Entered by:   DoAwilda BillMA (AAN[place]  Authorized by:   DoLafayette DragonD   Signed by:   DoAwilda BillMA (AAKohleron 09/18/2009   Method used:   Printed then faxed to ...       [facility]* (retail)       [place]  [address]     Ph: [phone]     Fax: [phone] RxID:   [id number] 200 MG CAPS (CELECOXIB) Take 1 tablet by mouth once a day  #30 x 6   Entered by:   DoAwilda BillMA (AAM[place] Authorized by:   DoLafayette DragonD   Signed by:   DoAwilda BillMA (AAHopelandon 09/18/2009   Method used:   [facility]        [facility]       [place]  [address]     Ph: [phone]     Fax: [phone] RxID:   [id number] ACID 1 MG TABS (FOLIC ACID) Take 1 tablet by mouth once a day.  #90 x 1   Entered by:   DoAwilda BillMA (AA[place]  Authorized by:   DoLafayette DragonD   Signed by:   DoAwilda BillMA (AABentleyvilleon 09/18/2009   Method used:   [facility] to        [facility]       [address]     [place]  [address]  Ph: [phone]       Fax: [phone]   RxID:   [id number] FOLIC ACID 1 MG TABS (FOLIC ACID) Take 1 tablet by mouth once a day.  #90 x 1   Entered by:   Awilda Bill CMA ([place])   Authorized by:   Lafayette Dragon MD   Signed by:   Awilda Bill CMA ([place]) on 09/18/2009   Method used:   Electronically to        [facility] (retail)       [place], [place]  [address]       Ph: [phone]       Fax: [phone]   RxID:   [id number] SULFASALAZINE 500 MG TABS (SULFASALAZINE) Take 3 tablets by mouth twice a day  #180 x 6   Entered by:   Awilda Bill CMA ([place])   Authorized by:   Lafayette Dragon MD   Signed by:   Awilda Bill CMA ([place]) on 09/18/2009   Method used:   Electronically to        [facility] (retail)       [address]       [place], [place]  [address]       Ph: [phone]       Fax: [phone]   RxID:   [id number]  of note: rx for folic acid d/c'ed at [facility]. It was [facility] to [facility]. Awilda Bill CMA Deborra Medina)  September 18, 2009 9:42 AM

## 2010-09-27 NOTE — Letter (Signed)
Summary: Risk analyst at [facility]. [address]   [place], [place] [address]   Phone: [phone]  Fax: [phone]        December 05, 2009 MRN: [id number]    [place], [place]  [address]    Dear Ms. Mallie Mussel,  Your test ordered by Rande Lawman has been reviewed by your physician (or physician assistant) and was found to be normal or stable. Your physician (or physician assistant) felt no changes were needed at this time.  ____ Echocardiogram  ____ Cardiac Stress Test  __X__ Lab Work-Liver function labs are good and LDL (bad cholesterol) is at goal.  ____ Peripheral vascular study of arms, legs or neck  ____ CT scan or X-ray  ____ Lung or Breathing test  ____ Other:   Thank you.   Gurney Maxin, RN, BSN    Bryon Lions, M.D., F.A.C.C. Maceo Pro, M.D., F.A.C.C. Cammy Copa, M.D., F.A.C.C. Vonda Antigua, M.D., F.A.C.C. Vita Barley, M.D., F.A.C.C. Mare Ferrari, M.D., F.A.C.C. Emi Belfast, PA-C

## 2010-09-27 NOTE — Assessment & Plan Note (Signed)
Summary: 6 MO FU PR OCT REMINDER  Medications Added CRESTOR 10 MG TABS (ROSUVASTATIN CALCIUM) Take one-fourth  tablet by mouth every day CRESTOR 10 MG TABS (ROSUVASTATIN CALCIUM) Take 1 tab by mouth at bedtime SULFASALAZINE 500 MG TBEC (SULFASALAZINE) Take 3 tablets by mouth two times a day (pharmacy-please d/c prescription for asacol) FISH OIL DOUBLE STRENGTH 1200 MG CAPS (OMEGA-3 FATTY ACIDS) Take 1 tablet by mouth once a day CELEBREX 200 MG CAPS (CELECOXIB) Take 1 tablet by mouth once a day as needed bad days only OSTEO BI-FLEX ADV TRIPLE ST  TABS (MISC NATURAL PRODUCTS) Take 1 tablet by mouth once a day NITROSTAT 0.4 MG SUBL (NITROGLYCERIN) 1 tablet under tongue at onset of chest pain; you may repeat every 5 minutes for up to 3 doses.      Allergies Added:   Visit Type:  Follow-up Primary Provider:  Dr. Matthias Hughs   History of Present Illness: 70 year old woman presents for follow-up. She was seen back in April. She reports occasional right ear and shoulder discomfort, although relates it possibly to a fall that she had injuring that side several months ago. Symptoms are somewhat reminiscent of her prior angina however. Last stress testing is reviewed below. She has not used any sublingual nitroglycerin, and does not endorse any specific exertional chest discomfort or unusual shortness of breath.  Labs from 6 October showed normal AST 23, ALT 16, cholesterol 131, triglycerides 105, HDL 66, LDL 44. Most recent carotid Dopplers are noted below. We discussed these today.  She reports compliance with her medications, taking Crestor at very low dose, essentially 2.5 mg daily.  We also discussed smoking cessation strategies today. She has tried to quit in the past and failed. She has not tried any prior medical therapy such as nicotine therapies or Wellbutrin SR. She is not at the point of considering another quit date. We also discussed the [employer].  She reports being under a lot of  stress.   Preventive Screening-Counseling & Management  Alcohol-Tobacco     Smoking Status: current     Smoking Cessation Counseling: yes     Packs/Day: 1/2 PPD  Current Medications (verified): 1)  Aspirin 81 Mg  Tbec (Aspirin) .... .qdtab 2)  Plavix 75 Mg  Tabs (Clopidogrel Bisulfate) .... Take 1 Tablet By Mouth Once A Day 3)  Toprol Xl 25 Mg  Tb24 (Metoprolol Succinate) .... Take 1 Tablet By Mouth Once A Day 4)  Crestor 10 Mg Tabs (Rosuvastatin Calcium) .... Take One-Fourth  Tablet By Mouth Every Day 5)  Folic Acid 1 Mg Tabs (Folic Acid) .... Take 1 Tablet By Mouth Once A Day. 6)  Fluoxetine Hcl 40 Mg Caps (Fluoxetine Hcl) .... One Tablet By Mouth Once Daily 7)  Caltrate 600+d Plus 600-400 Mg-Unit  Chew (Calcium Carbonate-Vit D-Min) .... Once Daily 8)  Vitamin B-6 100 Mg  Tabs (Pyridoxine Hcl) .... Once Daily 9)  Sulfasalazine 500 Mg Tbec (Sulfasalazine) .... Take 3 Tablets By Mouth Two Times A Day (Pharmacy-Please D/c Prescription For Asacol) 10)  Hydrocodone-Acetaminophen 10-650 Mg Tabs (Hydrocodone-Acetaminophen) .... 2-4 By Mouth Once Daily 11)  Fish Oil Double Strength 1200 Mg Caps (Omega-3 Fatty Acids) .... Take 1 Tablet By Mouth Once A Day 12)  Celebrex 200 Mg Caps (Celecoxib) .... Take 1 Tablet By Mouth Once A Day As Needed Bad Days Only 13)  Alprazolam 0.5 Mg Tabs (Alprazolam) .... Take 1/2-1 Tablet By Mouth At Bedtime As Needed 14)  Osteo Bi-Flex Adv Triple  St  Tabs (Misc Natural Products) .... Take 1 Tablet By Mouth Once A Day  Allergies (verified): 1)  ! * Mercaptopurine 2)  ! Penicillin  Comments:  Nurse/Medical Assistant: The patient's medication list and allergies were reviewed with the patient and were updated in the Medication and Allergy Lists.  Past History:  Past Medical History: Last updated: 12/06/2009 CAD - DES RCA 10/04 Crohn's colitis Osteoarthritis Previous TIA Sinus polyp C O P D Colonic polyps Hyperlipidemia Carotid disease - 88-41% RICA  and LICA (6/60), Dr. Oneida Alar Myocardial Infarction - IMI 10/04  Social History: Last updated: 12/06/2009 Alcohol Use - no Daily Caffeine Use Tobacco Use - Yes.   Social History: Packs/Day:  1/2 PPD  Review of Systems  The patient denies anorexia, fever, weight loss, chest pain, syncope, dyspnea on exertion, peripheral edema, headaches, melena, hematochezia, and severe indigestion/heartburn.         Otherwise reviewed and negative except as outlined above.  Vital Signs:  Patient profile:   70 year old female Height:      66 inches Weight:      236 pounds BMI:     38.23 Pulse rate:   61 / minute BP sitting:   103 / 67  (left arm) Cuff size:   large  Vitals Entered By: Shelley [name] (June 05, 2010 1:40 PM)  Nutrition Counseling: Patient's BMI is greater than 25 and therefore counseled on weight management options.  Physical Exam  Additional Exam:  Comfortable in no acute distress. HEENT: Conjuctivae and lids normal, oropharynx clear with moist mucosa. Neck: Supple, no elevated JVP, soft right carotid bruit, no thyromegaly or tenderness. Lungs: Nonlabored breathing at rest. CTA without rales or wheezes. Cor: PMI nondisplaced. RRR, normal S1/S2. No pathologic systolic murmurs. No S3 or rub. Ext: No CCE. Distal pulses 2+.  Skin: Warm and dry. Musculoskeletal: No gross deformities. Neuropsychiatric: Alert and oriented x3, affect appropriate.   Carotid Doppler  Procedure date:  04/25/2010  Findings:      IMPRESSION:   1. Doppler velocity suggests a 60%-79% stenosis of the right proximal       internal carotid artery and a 40%-59% stenosis of the left proximal       to mid internal carotid artery.   2. No significant change in Doppler velocities when compared to       previous exams.      ___________________________________________   Shelley Oto Fields, MD  Prior Report Reviewed for Nuclear Study:  Findings: 08/13/2007 [place] Cardiolite:  No diagnostic ST  segment changes during adenosine infusion. Left ventricular ejection fraction of 52%. Partially reversible inferior wall defect suggestive of scar with peri-infarct ischemia.  EKG  Procedure date:  06/05/2010  Findings:      Sinus rhythm at 63 beats per minute with decreased R-wave progression anteriorly, inferior Q waves as well.  Impression & Recommendations:  Problem # 1:  CORONARY ATHEROSCLEROSIS, NATIVE VESSEL (ICD-414.01)  We discussed proceeding with a followup stress test, specifically a Lexiscan cardiolite on medical therapy. Last evaluation was in 2008. She is having some symptoms, although fairly atypical. Followup office visit scheduled for review. Refill for nitroglycerine given.  Her updated medication list for this problem includes:    Aspirin 81 Mg Tbec (Aspirin) ..... Marland Kitchenqdtab    Plavix 75 Mg Tabs (Clopidogrel bisulfate) .Marland Kitchen... Take 1 tablet by mouth once a day    Toprol Xl 25 Mg Tb24 (Metoprolol succinate) .Marland Kitchen... Take 1 tablet by mouth once a day  Problem #  2:  TOBACCO ABUSE (ICD-305.1)  We again discussed smoking cessation, as reviewed above.  Problem # 3:  HYPERLIPIDEMIA (OHY-073.4)  Well controlled.  Refill for Crestor provided.  Her updated medication list for this problem includes:    Crestor 10 Mg Tabs (Rosuvastatin calcium) .Marland Kitchen... Take 1 tab by mouth at bedtime  Her updated medication list for this problem includes:    Crestor 10 Mg Tabs (Rosuvastatin calcium) .Marland Kitchen... Take 1 tab by mouth at bedtime  Problem # 4:  CAROTID ARTERY DISEASE (ICD-433.10)  Followed by Dr. Oneida Alar.  Her updated medication list for this problem includes:    Aspirin 81 Mg Tbec (Aspirin) ..... Marland Kitchenqdtab    Plavix 75 Mg Tabs (Clopidogrel bisulfate) .Marland Kitchen... Take 1 tablet by mouth once a day  Her updated medication list for this problem includes:    Aspirin 81 Mg Tbec (Aspirin) ..... Marland Kitchenqdtab    Plavix 75 Mg Tabs (Clopidogrel bisulfate) .Marland Kitchen... Take 1 tablet by mouth once a day  Other  Orders: EKG w/ Interpretation (93000) Nuclear Med (Nuc Med)  Patient Instructions: 1)  Follow up appt with Dr. Domenic Polite on Thursday, November 3rd at 9:20am. 2)  Crestor 56m by mouth at bedtime. 3)  Your physician recommended you take 1 tablet (or 1 spray) under tongue at onset of chest pain; you may repeat every 5 minutes for up to 3 doses. If 3 or more doses are required, call 911 and proceed to the ER immediately. 4)  Your physician has requested that you have an LAgricultural consultant  For further information please visit [email or web address]  Please follow instruction sheet, as given. Prescriptions: NITROSTAT 0.4 MG SUBL (NITROGLYCERIN) 1 tablet under tongue at onset of chest pain; you may repeat every 5 minutes for up to 3 doses.  #25 x 3   Entered by:   JGurney Maxin RN, BSN   Authorized by:   SBeckie Salts MD, F[facility]  Signed by:   JGurney Maxin RN, BSN on 06/05/2010   Method used:   [facility]ronically to        [facility]       [address]  [address]      Ph: [phone]      Fax: [phone]  RxID:   [id number] 10 MG TABS (ROSUVASTATIN CALCIUM) Take 1 tab by mouth at bedtime  #30 x 6   Entered by:   JGurney Maxin RN, BSN   Authorized by:   SBeckie Salts MD, FD[facility]  Signed by:   JGurney Maxin RN, BSN on 06/05/2010   Method used:   [facility]ronically to        [facility]       [address]      [place]  [address]      Ph: [phone]      Fax: [phone]  RxID:   [id number]

## 2010-09-27 NOTE — Letter (Signed)
Summary: Lexiscan or Dobutamine Adult nurse at [facility]. [address]   [place], [place] [address]   Phone: [phone]  Fax: [phone]      Calypso or Dobutamine Cardiolite Strss Test    Edmonia Lynch  Appointment Date:_  Appointment Time:_  Your doctor has ordered a CARDIOLITE STRESS TEST using a medication to stimulate exercise so that you will not have to walk on the treadmill to determine the condition of your heart during stress. If you take blood pressure medication, ask your doctor if you should take it the day of your test. You should not have anything to eat or drink at least 4 hours before your test is scheduled, and no caffeine, including decaffeinated tea and coffee, chocolate, and soft drinks for 24 hours before your test.  You will need to register at the Outpatient/Main Entrance at the hospital 15 minutes before your appointment time. It is a good idea to bring a copy of your order with you. They will direct you to the Diagnostic Imaging (Radiology) Department.  You will be asked to undress from the waist up and given a hospital gown to wear, so dress comfortably from the waist down for example: Sweat pants, shorts, or skirt Rubber soled lace up shoes (tennis shoes)  Plan on about three hours from registration to release from the hospital   You may take all of your medications with water the morning of your test.

## 2010-10-03 ENCOUNTER — Encounter (HOSPITAL_COMMUNITY)
Admission: RE | Admit: 2010-10-03 | Discharge: 2010-10-03 | Disposition: A | Discharge status: Home / Self Care | Payer: Medicare Other | Source: Ambulatory Visit | Attending: Orthopaedic Surgery | Admitting: Orthopaedic Surgery

## 2010-10-03 ENCOUNTER — Other Ambulatory Visit (HOSPITAL_COMMUNITY): Payer: Self-pay | Admitting: Orthopaedic Surgery

## 2010-10-03 DIAGNOSIS — Z01811 Encounter for preprocedural respiratory examination: Secondary | ICD-10-CM

## 2010-10-03 DIAGNOSIS — Z01818 Encounter for other preprocedural examination: Secondary | ICD-10-CM | POA: Insufficient documentation

## 2010-10-03 DIAGNOSIS — Z01812 Encounter for preprocedural laboratory examination: Secondary | ICD-10-CM | POA: Insufficient documentation

## 2010-10-03 LAB — CBC
MCH: 31.8 pg (ref 26.0–34.0)
MCV: 97.1 fL (ref 78.0–100.0)
Platelets: 233 10*3/uL (ref 150–400)
RDW: 13.9 % (ref 11.5–15.5)
WBC: 7.7 10*3/uL (ref 4.0–10.5)

## 2010-10-03 LAB — DIFFERENTIAL
Eosinophils Absolute: 0.2 10*3/uL (ref 0.0–0.7)
Eosinophils Relative: 2 % (ref 0–5)
Lymphs Abs: 2.2 10*3/uL (ref 0.7–4.0)

## 2010-10-03 LAB — URINALYSIS, ROUTINE W REFLEX MICROSCOPIC
Bilirubin Urine: NEGATIVE
Hgb urine dipstick: NEGATIVE
Ketones, ur: NEGATIVE mg/dL
Specific Gravity, Urine: 1.013 (ref 1.005–1.030)
Urine Glucose, Fasting: NEGATIVE mg/dL
pH: 7 (ref 5.0–8.0)

## 2010-10-03 LAB — COMPREHENSIVE METABOLIC PANEL
ALT: 18 U/L (ref 0–35)
AST: 22 U/L (ref 0–37)
Alkaline Phosphatase: 64 U/L (ref 39–117)
CO2: 27 mEq/L (ref 19–32)
Chloride: 103 mEq/L (ref 96–112)
GFR calc Af Amer: >60 mL/min (ref >60)
GFR calc non Af Amer: >60 mL/min (ref >60)
Sodium: 139 mEq/L (ref 135–145)
Total Bilirubin: 0.6 mg/dL (ref 0.3–1.2)

## 2010-10-03 LAB — TYPE AND SCREEN

## 2010-10-03 NOTE — Letter (Signed)
Summary: Pre-Op Clearance-Dr. Durward Fortes [facility]  Pre-Op [place]   Imported By: Gurney Maxin, RN, BSN 09/26/2010 10:24:28  _____________________________________________________________________  External Attachment:    Type:   Image     Comment:   External Document  Appended Document: Pre-Op Clearance-Dr. Christain [place] Preoperative cardiac clearance is requested prior to elective right total knee replacement. Please refer to my most recent office visit from November 2011, at which time the patient was clinically stable. She had just recently in fact undergone a followup Cardiolite which demonstrated evidence of inferior wall scar consistent with previous infarct, although no progressive ischemia, LVEF 50%. If she remains clinically stable, she should not require any additional cardiac testing and should be able to proceed with planned surgery at an acceptable cardiac risk. Note that she is on both aspirin and Plavix. These medicines could be held temporarily around the time of surgery. She had a drug-eluting stent placed in the RCA back in 2004.  Appended Document: Pre-Op Clearance-Dr. Durward Fortes [facility] Faxed with a copy of last office note to [facility]

## 2010-10-04 LAB — URINE CULTURE: Colony Count: 30000

## 2010-10-09 ENCOUNTER — Inpatient Hospital Stay (HOSPITAL_COMMUNITY)
Admission: RE | Admit: 2010-10-09 | Discharge: 2010-10-12 | DRG: 470 | Disposition: A | Discharge status: Skilled Nursing Facility | Payer: Medicare Other | Source: Ambulatory Visit | Attending: Orthopaedic Surgery | Admitting: Orthopaedic Surgery

## 2010-10-09 DIAGNOSIS — K5090 Crohn's disease, unspecified, without complications: Secondary | ICD-10-CM | POA: Diagnosis present

## 2010-10-09 DIAGNOSIS — IMO0002 Reserved for concepts with insufficient information to code with codable children: Principal | ICD-10-CM | POA: Diagnosis present

## 2010-10-09 DIAGNOSIS — I6529 Occlusion and stenosis of unspecified carotid artery: Secondary | ICD-10-CM | POA: Diagnosis present

## 2010-10-09 DIAGNOSIS — E669 Obesity, unspecified: Secondary | ICD-10-CM | POA: Diagnosis present

## 2010-10-09 DIAGNOSIS — Z8673 Personal history of transient ischemic attack (TIA), and cerebral infarction without residual deficits: Secondary | ICD-10-CM

## 2010-10-09 DIAGNOSIS — Z01812 Encounter for preprocedural laboratory examination: Secondary | ICD-10-CM

## 2010-10-09 DIAGNOSIS — Z6838 Body mass index (BMI) 38.0-38.9, adult: Secondary | ICD-10-CM

## 2010-10-09 DIAGNOSIS — E871 Hypo-osmolality and hyponatremia: Secondary | ICD-10-CM | POA: Diagnosis present

## 2010-10-09 DIAGNOSIS — Z9861 Coronary angioplasty status: Secondary | ICD-10-CM

## 2010-10-09 DIAGNOSIS — M1710 Unilateral primary osteoarthritis, unspecified knee: Principal | ICD-10-CM | POA: Diagnosis present

## 2010-10-09 DIAGNOSIS — D62 Acute posthemorrhagic anemia: Secondary | ICD-10-CM | POA: Diagnosis present

## 2010-10-09 DIAGNOSIS — I252 Old myocardial infarction: Secondary | ICD-10-CM

## 2010-10-09 DIAGNOSIS — Z01818 Encounter for other preprocedural examination: Secondary | ICD-10-CM

## 2010-10-09 DIAGNOSIS — I658 Occlusion and stenosis of other precerebral arteries: Secondary | ICD-10-CM | POA: Diagnosis present

## 2010-10-09 LAB — URINALYSIS, MICROSCOPIC ONLY
Leukocytes, UA: NEGATIVE
Protein, ur: NEGATIVE mg/dL
Urobilinogen, UA: 0.2 mg/dL (ref 0.0–1.0)

## 2010-10-10 LAB — CBC
HCT: 32.1 % — ABNORMAL LOW (ref 36.0–46.0)
MCH: 31.5 pg (ref 26.0–34.0)
MCV: 98.2 fL (ref 78.0–100.0)
Platelets: 190 10*3/uL (ref 150–400)
RBC: 3.27 MIL/uL — ABNORMAL LOW (ref 3.87–5.11)

## 2010-10-10 LAB — BASIC METABOLIC PANEL WITH GFR
BUN: 11 mg/dL (ref 6–23)
CO2: 28 meq/L (ref 19–32)
Calcium: 8.1 mg/dL — ABNORMAL LOW (ref 8.4–10.5)
Chloride: 101 meq/L (ref 96–112)
Creatinine, Ser: 0.79 mg/dL (ref 0.4–1.2)
GFR calc non Af Amer: >60 mL/min
Glucose, Bld: 126 mg/dL — ABNORMAL HIGH (ref 70–99)
Potassium: 4.6 meq/L (ref 3.5–5.1)
Sodium: 133 meq/L — ABNORMAL LOW (ref 135–145)

## 2010-10-10 LAB — URINE CULTURE
Colony Count: NO GROWTH
Culture  Setup Time: 201202141839
Culture: NO GROWTH

## 2010-10-11 DIAGNOSIS — M79609 Pain in unspecified limb: Secondary | ICD-10-CM

## 2010-10-11 LAB — BASIC METABOLIC PANEL
CO2: 26 mEq/L (ref 19–32)
Chloride: 100 mEq/L (ref 96–112)
GFR calc Af Amer: >60 mL/min (ref >60)
Potassium: 3.7 mEq/L (ref 3.5–5.1)
Sodium: 133 mEq/L — ABNORMAL LOW (ref 135–145)

## 2010-10-11 LAB — CBC
Hemoglobin: 9.7 g/dL — ABNORMAL LOW (ref 12.0–15.0)
MCH: 31.4 pg (ref 26.0–34.0)
RBC: 3.09 MIL/uL — ABNORMAL LOW (ref 3.87–5.11)
WBC: 11.1 10*3/uL — ABNORMAL HIGH (ref 4.0–10.5)

## 2010-10-12 LAB — BASIC METABOLIC PANEL
Calcium: 8.6 mg/dL (ref 8.4–10.5)
GFR calc Af Amer: >60 mL/min (ref >60)
GFR calc non Af Amer: >60 mL/min (ref >60)
Glucose, Bld: 105 mg/dL — ABNORMAL HIGH (ref 70–99)
Sodium: 136 mEq/L (ref 135–145)

## 2010-10-12 LAB — CBC
HCT: 27.5 % — ABNORMAL LOW (ref 36.0–46.0)
Hemoglobin: 9.1 g/dL — ABNORMAL LOW (ref 12.0–15.0)
MCH: 31.5 pg (ref 26.0–34.0)
MCV: 95.2 fL (ref 78.0–100.0)
Platelets: 183 10*3/uL (ref 150–400)
RBC: 2.89 MIL/uL — ABNORMAL LOW (ref 3.87–5.11)
RDW: 13.4 % (ref 11.5–15.5)
WBC: 9.3 10*3/uL (ref 4.0–10.5)

## 2010-10-23 NOTE — Discharge Summary (Signed)
NAME:  Shelley [name], Shelley [name]             ACCOUNT NO.:  [id number]  MEDICAL RECORD NO.:  [id number]           PATIENT TYPE:  I  LOCATION:  9485                         FACILITY:  [facility]  PHYSICIAN:  Vonna Kotyk. Yulieth Carrender, M.D.DATE OF BIRTH:  [birth year]  DATE OF ADMISSION:  10/09/2010 DATE OF DISCHARGE:  10/12/2010                        DISCHARGE SUMMARY - REFERRING   ADMISSION DIAGNOSIS:  Osteoarthritis of the right knee.  DISCHARGE DIAGNOSES: 1. Osteoarthritis of the right knee. 2. Acute blood loss anemia. 3. Hyponatremia. 4. OA left knee. 5. History of myocardial infarction with stents. 6. Atherosclerotic cardiovascular disease of the carotids 7. History of Crohn's. 8. History of cerebrovascular accident. 9. Obesity.  PROCEDURE:  Right total knee arthroplasty.  HISTORY:  Shelley [name] is a 70 year old white female with chronic bilateral knee pain, right greater than left.  She has had symptoms dating back to as last 6 years.  At that time, she was told that she needed to have a total joint replacement, but she has suffered with this until most recently.  Now she is having significant pain in the right knee, which is moderately severe.  She must use a cane for ambulation. She has failed conservative treatment including cortisone, Hyalgan, and limited activity.  She has bone-on-bone OA radiographically.  She is indicated for right total knee arthroplasty.  HOSPITAL COURSE:  A 70 year old white female, admitted October 09, 2010, after appropriate laboratory studies were obtained as well as 20 grams of Ancef IV on-call to the operating room.  She was taken to the operating room where she underwent a right total knee arthroplasty by Dr. Joni Fears assisted by Dr. Biagio Borg, PA-C.  This involved a DePuy LCS standard plus femoral component #3 with a rotating keel tibial tray, a 12.5-mm polyethylene bridging bearing, and metal-backed three peg rotating patella, and all  components were secured with polymethylmethacrylate.  She tolerated the procedure well.  She was placed on Dilaudid reduced dose PCA pump.  Started on Xarelto 10 mg p.o. daily starting at 7 a.m. on October 10, 2010.  SCDs were ordered and thigh-high TED hose were used.  Foley was placed intraoperatively.  CPM was placed postoperatively 0 to 60 degrees for 8 hours per day increasing by 5 degrees per day.  She is to be 50% partial weightbearing.  Consultation with PT and Social Service was ordered. She was allowed out of bed to a chair the following day.  She was weaned off her PCA and the IV was saline locked.  She was weaned off her O2 keeping sats greater than 92%.  Cepacol throat lozenges was used intermittently for sore throat status post intubation.  Nicotine patch was also used.  On the October 11, 2010, her dressing was changed.  Her wound was in good repair and no signs of infection.  She did have some calf pain.  A Doppler was ordered and was negative.  Remainder of her hospital course was uneventful and she is being discharged on October 12, 2010, to return back to the office in follow up.  LABORATORY DATA:  Laboratory studies reveals admission hemoglobin 13.4, hematocrit 40.9%.  White count 7,1100 and  platelets were 233,000. Hemoglobin on October 11, 2010, was 9.7, hematocrit 30.1%, white count 11,100 and platelets was 196,000.  Preop sodium was 139, potassium 4.8, chloride 103, CO2 of 27, glucose 74, BUN 9, creatinine 0.76.  GFR was greater than 60.  Bilirubin was 0.6, alk phos 64, SGOT 22, SGPT 18, total protein 6.6, albumin 4.0, and calcium was 9.7.  Her sodium on October 11, 2010, was 133, potassium 3.7, chloride 100, CO2 of 26, glucose 133, BUN 8, creatinine 0.69, GFR was greater than 60, calcium was 8.5.  Urinalysis preoperatively was benign; however, on culture, there was 30,000 colonies of multi-bacterial morphotypes.  At that time, a Foley was placed, urine  revealed squamous, 0 to 2 white, and 0 to 2 red, and there was no growth on her urine culture that day.  Blood type was O+.  Antibody screen negative.  Radiographic studies revealed no active disease on chest x-ray.  There is chronic pulmonary scarring.  DISCHARGE INSTRUCTIONS:  She can have diet as tolerated as she had prior to her hospitalization.  No lifting or driving for 6 weeks.  She may shower without dressing once there is no drainage.  She is not to wash over the wound.  If drainage remains, the wound should be covered with plastic wrapper then shower.  She is to be 50% weightbearing as taught in physical therapy.  Use a walker as instructed.  She is to use her TED hose for at least 3 weeks mainly on the right leg.  She may remove at night time for sleeping.  She needs to follow back up in the office on October 24, 2010, and an appointment must be made by calling [phone] and she will be seen by Dr. Biagio Borg, PA.  Dressing may be changed on a daily basis starting on Saturday after discharge.  Use a sterile 4 x 4 gauze and then apply the TED hose.  May clean the incision with alcohol prior to redressing.  She is to use CPM from 0  to 70 degrees for 8 hours per day.  She may increase by 5 to 10 degrees per day as tolerated.  She will need to use CPM for at least 3 weeks.  She is not put a pillow under the knee, but place it under the heel causing extension.  She is to have physical therapy as per protocol for range of motion and strengthening.  Med reconciliation sheet has been printed and will follow her to the facility.  She was discharged in improved condition.     Mike Craze Petrarca, P.A.-C.   ______________________________ Vonna Kotyk. Durward Fortes, M.D.    BDP/MEDQ  D:  10/11/2010  T:  10/11/2010  Job:  [id number]  Electronically Signed by Biagio Borg P.A.-C. on 10/16/2010 11:22:21 AM Electronically Signed by Joni Fears M.D. on 10/23/2010 11:16:50 AM

## 2010-10-23 NOTE — Op Note (Signed)
NAME:  Shelley [name], Shelley [name]             ACCOUNT NO.:  [id number]  MEDICAL RECORD NO.:  [id number]           PATIENT TYPE:  I  LOCATION:  2876                         FACILITY:  [facility]  PHYSICIAN:  Vonna Kotyk. Navpreet Szczygiel, M.D.DATE OF BIRTH:  07/18/1941  DATE OF PROCEDURE:  10/09/2010 DATE OF DISCHARGE:                              OPERATIVE REPORT   PREOPERATIVE DIAGNOSES: 1. End-stage osteoarthritis, right knee. 2. Obesity (body mass index 38).  POSTOPERATIVE DIAGNOSES: 1. End-stage osteoarthritis, right knee. 2. Obesity (body mass index 38).  PROCEDURE:  Right total knee replacement.  SURGEON:  Vonna Kotyk. Durward Fortes, MD  ASSISTANT:  Mike Craze. Petrarca, PA-C  ANESTHESIA:  General with supplemental femoral nerve block.  COMPLICATIONS:  None.  COMPONENTS:  DePuy LCS standard plus femoral component #3, rotating keeled tibial tray, a 12.5-mm polyethylene bridging bearing, a metal backed three-peg rotating patella.  Components were secured with polymethyl methacrylate.  PROCEDURE:  Shelley [name] was met in the holding area, identified and marked the right knee as the appropriate operative site.  She received preoperative femoral nerve block.  The patient was then transported to room #1, placed under general orotracheal anesthesia without difficulty.  Nursing staff inserted a Foley catheter.  Urine was dark yellow.  We did obtain a specimen for culture and sensitivity.  Preoperatively, she had 30,000 colonies of bacteria that was asymptomatic.  Tourniquet was then applied to the right thigh.  The leg was prepped with Betadine scrub and DuraPrep.  From the tourniquet to the midfoot, sterile draping was performed.  The patient did receive Ancef preoperatively.  With extremity elevated, it was Esmarch exsanguinated with a proximal tourniquet to 350 mmHg.  The midline longitudinal incision was made centered about the patella, extending to the superior pouch of the tibial tubercle.  Via  sharp dissection, incision was carried down to subcutaneous tissue.  There was abundant adipose tissue that was incised, the first layer of capsule was incised in the midline.  A medial parapatellar incision was then made through the deep capsule with the Bovie.  The joint was entered.  There was a clear yellow joint effusion.  Patella was then everted to 180 degrees laterally and knee flexed to 90 degrees.  There were large osteophytes along the medial and lateral femoral condyle.  There was moderate chronic and somewhat semi-acute synovitis. Synovectomy was performed, but there was very poor skin turgor and tissue consistency, I could easily peel the synovium from the underlying adipose tissue.  There was almost complete loss of articular cartilage along the medial compartment.  There was a preoperative varus but it was not fixed.  I could easily correct the knee in neutral at that point.  We measured a standard plus femoral component.  Initial cut was made transversely in the proximal tibia with the external tibial guide.  Subsequent cuts were then made on the femur using a 12.5-mm flexion/extension gap.  A 4-degree distal femoral valgus cut was made. MCL and LCL remained intact throughout the procedure.  Lamina spreaders were then placed along the medial and lateral compartments and medial, lateral meniscectomy was performed.  ACL and PCL were removed  with the Bovie.  Osteophytes posteriorly removed with a three-quarter inch curved osteotome.  There was a Baker cyst posteriorly.  I did debride the synovium and decompress any fluid.  The finishing jig was then applied to the femur, tapered cuts were performed followed by the centralized femoral peg holes.  Retractor was then placed about the tibia, it was advanced anteriorly and measured a #3 rotating keeled component.  The trial jig was then applied, the central hole made followed by the keeled cut.  With the tibial jig in  place, the 12.5-mm bridging bearing was then inserted followed by the standard plus femoral component.  Through a full range of motion, I had full extension, no opening with varus or valgus stress. With flexion, there was no malrotation of the tibial tray.  The patella was then prepared by removing 10 mm of bone, leaving about 12.5 mm of patella thickness.  The three-pegged jig was applied, three holes made, and the trial patella applied.  It was then reduced and through a full range of motion was not unstable.  The trial components were then removed.  The joint was copiously irrigated with saline solution.  The final components were then secured with polymethyl methacrylate.  I initially applied the metallic tibial tray.  Extraneous methacrylate was removed from its periphery.  The surface was cleaned and the 12.5-mm bridging bearing was applied.  The standard plus femoral component was then secured with polymethyl methacrylate.  Knee was placed in extension, and extraneous methacrylate was removed from around the periphery of both components.  The patella was then applied with methacrylate and patella jig.  The wound was again irrigated with saline solution.  The deep capsule was infiltrated with 0.25% Marcaine with epinephrine.  Sterile bulky dressing was applied.  The tourniquet was deflated.  Gross bleeders were Bovie coagulated. Bone wax was applied to bleeding bone surface, I had a nice dry field. Wound was again irrigated with saline solution.  The deep capsule was closed with interrupted #1 Ethibond, superficial capsule with a running 0 Vicryl, subcu with 2-0 Vicryl and 3-0 Monocryl.  The sterile bulky dressing was applied followed by the patient's support stocking.  The patient tolerated the procedure well without complications.     Vonna Kotyk. Durward Fortes, M.D.     PWW/MEDQ  D:  10/09/2010  T:  10/10/2010  Job:  [id number]  Electronically Signed by Joni Fears M.D.  on 10/23/2010 11:16:55 AM

## 2010-10-25 ENCOUNTER — Other Ambulatory Visit: Payer: Self-pay

## 2010-11-29 ENCOUNTER — Other Ambulatory Visit: Payer: Self-pay

## 2010-11-30 LAB — BASIC METABOLIC PANEL
BUN: 22 mg/dL (ref 6–23)
GFR calc Af Amer: 51 mL/min — ABNORMAL LOW (ref >60)
GFR calc non Af Amer: 42 mL/min — ABNORMAL LOW (ref >60)
Potassium: 4.1 mEq/L (ref 3.5–5.1)
Sodium: 135 mEq/L (ref 135–145)

## 2010-11-30 LAB — HEMOGLOBIN AND HEMATOCRIT, BLOOD: Hemoglobin: 14 g/dL (ref 12.0–15.0)

## 2010-12-18 ENCOUNTER — Telehealth: Payer: Self-pay | Admitting: *Deleted

## 2010-12-18 DIAGNOSIS — E782 Mixed hyperlipidemia: Secondary | ICD-10-CM

## 2010-12-18 DIAGNOSIS — Z79899 Other long term (current) drug therapy: Secondary | ICD-10-CM

## 2010-12-18 NOTE — Telephone Encounter (Signed)
Pt left message asking for return call regarding possible labs before upcoming appt.  Spoke with pt who states she usually has labs done before appt. She thinks this is cholesterol but isn't sure if anything else is done. Pt has not had cholesterol done (according to our records) since October. She will do this before office visit.

## 2010-12-26 ENCOUNTER — Other Ambulatory Visit (INDEPENDENT_AMBULATORY_CARE_PROVIDER_SITE_OTHER): Payer: Medicare Other

## 2010-12-26 DIAGNOSIS — I6529 Occlusion and stenosis of unspecified carotid artery: Secondary | ICD-10-CM

## 2010-12-28 NOTE — Procedures (Unsigned)
CAROTID DUPLEX EXAM  INDICATION:  Follow up carotid disease.  HISTORY: Diabetes:  No. Cardiac:  No. Hypertension:  Yes. Smoking:  Yes. Previous Surgery:  No. CV History: Amaurosis Fugax No, Paresthesias No, Hemiparesis No.                                      RIGHT             LEFT Brachial systolic pressure:         140               152 Brachial Doppler waveforms:         WNL               WNL Vertebral direction of flow:        Antegrade         Antegrade DUPLEX VELOCITIES (cm/sec) CCA peak systolic                   113               79 ECA peak systolic                   87                444 ICA peak systolic                   314               619 ICA end diastolic                   107               74 PLAQUE MORPHOLOGY:                  Heterogenous      Heterogenous PLAQUE AMOUNT:                      Moderate-to-severe                  Moderate PLAQUE LOCATION:                    CCA/ICA           CCA/ECA/ICA  IMPRESSION: 1. High-end 60% to 79% right internal carotid artery stenosis.  This     is an increase in velocity since prior examination. 2. 60% to 79% left internal carotid artery stenosis.  This is an     increase in category since previous examination. 3. Antegrade vertebral arteries bilaterally. 4. Disease progression bilaterally since prior examination of     04/25/2010.  ___________________________________________ Jessy Oto. Fields, MD  LT/MEDQ  D:  12/26/2010  T:  12/26/2010  Job:  [id number]

## 2011-01-08 ENCOUNTER — Encounter: Payer: Self-pay | Admitting: *Deleted

## 2011-01-08 ENCOUNTER — Encounter: Payer: Self-pay | Admitting: Cardiology

## 2011-01-08 NOTE — Assessment & Plan Note (Signed)
[facility] OFFICE NOTE   ADRIANNAH, STEINKAMP                    MRN:          [id number]  DATE:09/03/2007                            DOB:          May 30, 1941    PRIMARY CARDIOLOGIST:  Dr. Johnny Bridge.   PRIMARY CARE PHYSICIAN:  Dr. Matthias Hughs.   REASON FOR VISIT:  One-month followup.   HISTORY OF PRESENT ILLNESS:  Ms. Bas is a 70 year old female patient  with a history of coronary disease who I saw on July 30, 2007 in  followup.  Please see that note for complete details.  Briefly, Ms.  Scholten had reported some interscapular back pain that she had had off  and on since her myocardial infarction in 2004.  This was without  change.  We set her up for an adenosine Cardiolite.  This revealed an EF  of 52% with normal wall motion and medium, partially reversible apical  basal inferior defect consistent with ischemia in addition to prior  myocardial infarction.  She also had followup carotid Dopplers  performed.  This revealed 50-70% right ICA stenosis and 50% left ICA  stenosis.  The results were faxed to Dr. Oneida Alar, who has seen her in the  past for evaluation of her carotid stenosis.  She had some lab work done  secondary to episodic diaphoresis.  A 24-hour urine was unremarkable for  pheochromocytoma.  Her TSH was normal and her CBC was essentially normal  except for a mildly elevated white count in a patient who is on chronic  prednisone.   The patient returns to the office today for followup.  She has had no  change in her symptoms.  In talking to her further, the patient notes  that she has continued to have this interscapular back pain off and on  since her myocardial infarction without any change what so ever.  She  denies significant dyspnea exertion.  Denies orthopnea, PND, pedal  edema.  Denies any syncope.  She denies any chest pain.  She denies any  exertional symptoms.  Her symptoms  last probably a minute or 2, but no  more.   CURRENT MEDICATIONS:  1. Plavix 75 mg daily.  2. Metoprolol ER 25 mg daily.  3. Aspirin 81 mg daily.  4. Prozac 20 mg daily.  5. Crestor 5 mg daily.  6. Prednisone 10 mg daily.  7. Fish oil 1200 mg daily.  8. Caltrate.  9. Doxycycline 200 mg daily.  10.Lialda 1.2 mg 2 tablets daily.   PHYSICAL EXAM:  She is a well-nourished developed female in no acute  distress.  Blood pressure 121/66, pulse 64, weight 249.6 pounds.  Oxygen saturation  98% on room air.  HEENT is normal.  NECK:  Without JVD at 90 degrees.  CARDIAC:  Normal S1, S2.  Regular rate and rhythm without murmur.  LUNGS:  Clear to auscultation bilaterally without wheezing, rhonchi or  rales.  ABDOMEN:  Soft, nontender.  EXTREMITIES:  Without edema.  NEUROLOGIC:  She is alert and oriented x3, Cranial nerves 2-12 grossly  intact.   IMPRESSION:  1.  Bilateral shoulder pain.  Suspect noncardiac.  2. Episodic diaphoresis.      a.     Resolved on doxycycline therapy for acute sinusitis.  3. Coronary artery disease.      a.     Status post inferior ST elevation myocardial infarction       October 2004 treated with a Taxus drug-eluting stent to the right       coronary artery.      b.     Low risk adenosine Cardiolite December 2008.  4. Good left ventricular function.  5. Chronic obstructive pulmonary disease.  6. Hyperlipidemia followed by Dr. Scotty Court.  7. Cerebrovascular disease with 50-70% right internal carotid artery      stenosis and 50% left internal carotid artery stenosis by carotid      Dopplers August 14, 2007.      a.     Followup pending with Dr. Oneida Alar.  8. Crohn's disease.   PLAN:  The patient presents to the office for followup.  I have reviewed  her Cardiolite study with Dr. Domenic Polite.  We have compared it to her  previous Cardiolite done in 2006.  There does not seem to be a  significant change and there has been no significant change her  symptoms.   Therefore, at this point we plan to proceed with medical  therapy.  I offered her the addition of low-dose antianginal but she  prefers to hold off on this for now.   She started on doxycycline recently for sinusitis.  She has not had a  further episodic diaphoresis.  Workup I initiated has been fairly  negative.  She can follow up Dr. Scotty Court for this.   She has been contacted by Dr. Oneida Alar' office and they plan to see her  back in followup for her cerebrovascular disease.  We will provide her  with a copy of the recent carotid Dopplers so that she can take those  into her appointment.   We will see her back in 6 months or sooner p.r.n.      Richardson Dopp, PA-C  Electronically Signed      Satira Sark, MD  Electronically Signed   SW/MedQ  DD: 09/03/2007  DT: 09/03/2007  Job #: [id number]   cc:   Mingo Amber. Oneida Alar, MD

## 2011-01-08 NOTE — Assessment & Plan Note (Signed)
[facility]                         GASTROENTEROLOGY OFFICE NOTE   NEEMA, BARREIRA                    MRN:          [id number]  DATE:11/02/2007                            DOB:          1941/04/21    HISTORY:  Ms. Zadrozny is a 70 year old white female with Crohn's colitis  since September 1998, a history of a melanoma, gastroesophageal reflux  disease, status post herpes zoster in the left chest in 2001.  She had  6MP-induced pancreatitis in 2003.  Her last colonoscopy in November  2008, showed active Crohn's disease with a small cecal polyp which was  removed.  The patient has been on a slow taper of prednisone by 2.5 mg  q.4 weeks.  She is currently down to 5 mg daily.  She will go to 2.5 mg  next weeks.  She also was put on Lialda 1.2 grams, four tablets daily,  but decreased this to three tablets daily because she saw one pill pass  through the stool.  The patient continues Plavix for her coronary artery  disease and arrhythmia and on aspirin 81 mg daily.  The patient has no  complaints today.  She denies rectal bleeding, abdominal pain or  diarrhea.   PHYSICAL EXAMINATION:  VITAL SIGNS:  Blood pressure 134/70, pulse 62,  weight 255 pounds.  GENERAL:  The patient is alert and oriented, in no distress.  LUNGS:  Clear to auscultation.  COR:  S1 normal.  S2 normal.  ABDOMEN:  Soft, nontender.   IMPRESSION:  A 70 year old white female with Crohn's colitis, currently  in remission, on a tapering dose of prednisone.   PLAN:  1. Continue the taper as suggested, 2.5 mg for four weeks, then 2.5 mg      every other day for four weeks, then discontinue.  2. Continue Lialda at three tab daily.  3. First visit in three months.  4. Refill for tramadol and prednisone.     [name]. [name], MD  Electronically Signed    DMB/MedQ  DD: 11/02/2007  DT: 11/02/2007  Job #: [id number]   cc:   Matthias Hughs

## 2011-01-08 NOTE — Assessment & Plan Note (Signed)
OFFICE VISIT   Shelley [name], Shelley [name]  DOB:  June 30, 1941                                       09/09/2007  KDPTE#:[id number]   The patient returns for followup today.  She was last seen in January  2008 for a symptomatic moderate carotid stenosis.  She recently had a  carotid duplex exam performed at [facility] in December of 2008.  This again showed a moderate carotid stenosis on the right side of 50-  70%.  The left side is approximately 50%.  She has reported no TIA,  amaurosis, or stroke symptoms in the past year.  She recently switched  her simvastatin to Crestor for cholesterol control.  Unfortunately, she  continues to smoke 1/2 to 1 pack of cigarettes per day.  She apparently  has been under some stress recently with some divorce proceedings and  has been unable to quit due to this excess stress.  She also has a  history of coronary artery disease and previous stenting.  She denies  history of diabetes or hypertension.  Medications continue to include  aspirin and Plavix.   PHYSICAL EXAM:  Blood pressure 139/69, pulse 64 and regular.  HEENT is  unremarkable.  She has 2+ carotid pulses without bruit.  Chest is clear  to auscultation.  Cardiac exam is regular rate and rhythm.  Abdomen is  soft, nontender, nondistended.  Obese.   The patient has a moderate carotid stenosis, which is essentially  unchanged over the last year, or she may have had some slight  progression.  Overall, she is asymptomatic.  I believe the best  management for her is continued risk factor modification.  She will  return in 6 months' time for repeat carotid duplex exam.  If, at that  time, her stenosis is unchanged, we may switch to once yearly carotid  duplex scans.   Jessy Oto. Fields, MD  Electronically Signed   CEF/MEDQ  D:  09/09/2007  T:  09/10/2007  Job:  [id number]   cc:   Isabella Stalling, MD,FACC

## 2011-01-08 NOTE — Assessment & Plan Note (Signed)
OFFICE VISIT   Shelley [name], Shelley [name]  DOB:  05/06/1941                                       03/09/2008  TAVWP#:[id number]   The patient is a 70 year old female that we have been following for  asymptomatic moderate carotid stenosis.  She continues to deny any  symptoms of TIA, amaurosis or stroke.  Unfortunately she continues to  smoke one pack of cigarettes per day.  She also complains of some  numbness in her feet at night time.  She denies any claudication  symptoms.  Atherosclerotic risk factors continue to include smoking and  coronary artery disease.  She also has a history of elevated  cholesterol.   MEDICATIONS:  1. Include aspirin 81 mg once a day.  2. Plavix 75 mg once a day.  3. Toprol 25 mg once a day.  4. Prozac 20 mg once a day.  5. Asacol 400 mg once a day.  6. Fish oil once a day.  7. Caltrate D once a day.  8. Vitamin B6 100 mg once a day.  9. Crestor 5 mg once a day.  10.Tramadol p.r.n.   PHYSICAL EXAMINATION:  Vital signs:  On physical exam today blood  pressure is 138/70 in the left arm, 148/57 in the right arm, pulse is 60  and regular.  HEENT:  Unremarkable.  She has 2+ carotid pulses without  bruit.  Chest:  Clear to auscultation.  Cardiac:  Regular rate and  rhythm.  Neurological:  Exam shows symmetric upper extremity and lower  extremity motor strength.  She has 2+ dorsalis pedis pulses in her feet  bilaterally.   As far as the patient's carotid disease she had a repeat carotid duplex  exam today which showed no significant change from over a year ago.  She  still has 60-80% right internal carotid stenosis and 40-60% left  internal carotid artery stenosis.  She needs a followup duplex exam in  six months' time.  Her feet numbness is most likely secondary to  neuropathy.  She has palpable pulses in both feet and I do not believe  this is related to arterial occlusive disease.  She is reassured  regarding this today.  I will  see her again in six months' time.   Jessy Oto. Fields, MD  Electronically Signed   CEF/MEDQ  D:  03/09/2008  T:  03/10/2008  Job:  [id number]

## 2011-01-08 NOTE — Assessment & Plan Note (Signed)
[place] OFFICE NOTE   LORETTE, PETERKIN                    MRN:          [id number]  DATE:01/21/2007                            DOB:          [birth year]    PROGRESS NOTE.   Ms. Egge is a 70 year old female with Crohn's colitis, last  colonoscopy September, 2003.  She is due for recall colonoscopy in  September, 2008.  She has just finished prednisone taper after  exacerbation of her colitis.  The last dose was 2.5 mg a day for 2 weeks  and this was discontinued 1 week ago.  She remains asymptomatic.  Her  bowel movements are formed.  She has about 2 or 3 bowel movements in the  mornings.  She denies crampy abdominal pain, she denies rectal bleeding.  Since the prednisone has been tapered off she has complained of  arthralgia for which she takes tramadol 50 mg a day and Motrin 200 mg  three times a day.  She has been going through a difficult divorce from  her husband who essentially abandoned her.  But despite of all the  stress she has done quite well, using fluoxetine 20 mg p.o. daily.   MEDICATIONS:  1. Asacol 400 mg three b.i.d.  2. Plavix 75 mg p.o. daily.  3. Toprol-XL 25 mg p.o. daily.  4. Crestor 5 mg p.o. daily.  5. Fish Oil.  6. Osteo Bi-Flex.  7. Motrin one p.o. t.i.d.   PHYSICAL EXAM:  Blood pressure 132/68, pulse 68, weight 250 pounds, last  weight was 246 pounds.  She was alert, oriented, no distress.  LUNGS:  Clear to auscultation.  COR:  Normal S1, normal S2.  ABDOMEN:  Obese, soft, nontender.  Normoactive bowel sounds, no  fullness.  Liver edge at costal margin.  RECTAL EXAM:  Not repeated.  EXTREMITIES:  No edema.   IMPRESSION:  A 70 year old white female with Crohn's colitis, currently  in symptomatic remission after a flare-up.  She responded to oral  prednisone.   PLAN:  1. Because of the expense of the Asacol, we will switch her to Lialda      1.2 g, she takes two  tablets every morning.  We gave her samples as      well as a discount card which she can use for 5 refills.  2. Colonoscopy, September, 2008; she will call to schedule.  3. Continue all other medications.   I would like to keep her on Plavix while she has colonoscopy.     [name]. [name], MD  Electronically Signed    DMB/MedQ  DD: 01/21/2007  DT: 01/21/2007  Job #: [id number]   cc:   Matthias Hughs

## 2011-01-08 NOTE — Procedures (Signed)
CAROTID DUPLEX EXAM   INDICATION:  Follow up known carotid artery disease.   HISTORY:  Diabetes:  No.  Cardiac:  No.  Hypertension:  No.  Smoking:  Yes.  Previous Surgery:  CV History:  Amaurosis Fugax No, Paresthesias No, Hemiparesis No.                                       RIGHT             LEFT  Brachial systolic pressure:         168               150  Brachial Doppler waveforms:         Biphasic          Biphasic  Vertebral direction of flow:        Antegrade         Antegrade  DUPLEX VELOCITIES (cm/sec)  CCA peak systolic                   89                244  ECA peak systolic                   167               975  ICA peak systolic                   285               300  ICA end diastolic                   74                55  PLAQUE MORPHOLOGY:                  Heterogenous      Heterogenous  PLAQUE AMOUNT:                      Moderate-to-severe                  Moderate  PLAQUE LOCATION:                    ICA, ECA          ICA, ECA   IMPRESSION:  1. 60-79% stenosis noted in bilateral internal carotid arteries.  2. Antegrade bilateral vertebral arteries.     ___________________________________________  Jessy Oto Fields, MD   MG/MEDQ  D:  09/18/2009  T:  09/19/2009  Job:  [id number]

## 2011-01-08 NOTE — Assessment & Plan Note (Signed)
[place] OFFICE NOTE   Shelley, [name]                    MRN:          [id number]  DATE:06/10/2007                            DOB:          11-04-1940    Shelley [name] is a 70 year old white female with Crohn's colitis.  She has  had 2 flairs up this year, both of them coincided with extreme stress.  First one when her husband asked her for separation, the second one when  they went to court about 4 weeks ago.  We have put her on prednisone 30  mg a day which she has decreased to 20 mg two weeks later and she is  currently starting on 15 mg a day.  Her symptoms have somewhat improved  but are not completely resolved.  She still have diffuse abdominal  tenderness and bloating.  The bleeding has stopped completely as well as  the diarrhea.   MEDICATIONS:  Lialda 1.2 grams two every morning and one every night.   PHYSICAL EXAMINATION:  Blood pressure 122/62, pulse 60 and weight 245  pounds.  She was alert and oriented, no distress.  LUNGS:  Clear to auscultation.  COR:  With normal S1, normal S2.  ABDOMEN:  Soft, obese with diffuse tenderness in the left lower quadrant  and also right lower quadrant.  Normoactive bowel sounds, no fluid wave.  Also some epigastric tenderness.  EXTREMITIES:  Trace edema with some hyperpigmentation of lower  extremities.   IMPRESSION:  A 70 year old white female with Crohn's colitis with recent  flair up, somewhat improved but not well.  She is due for colonoscopy,  last colon exam 2003.   PLAN:  1. Continue to taper prednisone down to 15 mg a day.  2. Lialda 1.2 grams two q.a.m.  3. Resume Alprazolam 0.25 mg daily.  4. Low residue diet.  5. Colonoscopy scheduled.  She is to stay on low residue diet.  We      will also do the colonoscopy while she is on Plavix.     [name]. [name], MD  Electronically Signed    DMB/MedQ  DD: 06/10/2007  DT: 06/11/2007  Job #:  [id number]   cc:   Matthias Hughs

## 2011-01-08 NOTE — Procedures (Signed)
CAROTID DUPLEX EXAM   INDICATION:  Follow up carotid artery disease.   HISTORY:  Diabetes:  No.  Cardiac:  CAD.  Hypertension:  No.  Smoking:  Yes.  Previous Surgery:  No.  CV History:  No.  Amaurosis Fugax No, Paresthesias No, Hemiparesis No.                                       RIGHT             LEFT  Brachial systolic pressure:         180               174  Brachial Doppler waveforms:         Normal            Normal  Vertebral direction of flow:        Antegrade         Antegrade  DUPLEX VELOCITIES (cm/sec)  CCA peak systolic                   71                72  ECA peak systolic                   101               69  ICA peak systolic                   238               902  ICA end diastolic                   64                38  PLAQUE MORPHOLOGY:                  Mixed             Mixed  PLAQUE AMOUNT:                      Moderate          Mild/moderate  PLAQUE LOCATION:                    ICA/CCA           ICA/CCA   IMPRESSION:  1. 60-79% stenosis of the right internal carotid artery.  2. 40-59% stenosis of the left internal carotid artery.  3. No significant change noted when compared to the previous      examination on 12/31/06.   ___________________________________________  Jessy Oto. Fields, MD   CH/MEDQ  D:  03/09/2008  T:  03/09/2008  Job:  [id number]

## 2011-01-08 NOTE — Assessment & Plan Note (Signed)
OFFICE VISIT   Shelley [name], Shelley [name]  DOB:  [birth year]                                       09/21/2008  ZNBVA#:[id number]   The patient is a 70 year old female who was last seen in July of 2009.  We have been following her for an asymptomatic moderate carotid  stenosis.  Her atherosclerotic risk factors continue to include obesity  and tobacco abuse.  She also has a history of coronary artery disease  and elevated cholesterol.   She continues to deny any symptoms of amaurosis, TIA or stroke.   Vital signs:  On physical exam today blood pressure is 119/70 in the  left arm, 121/74 in the right arm, pulse is 70 and regular.  Temperature  is 97.9.  HEENT:  Unremarkable.  She has 2+ carotid pulses.  There is no  bruit.  Chest:  Clear to auscultation.  Cardiac:  Regular rate and  rhythm.  Abdomen:  Soft, nontender, obese, no mass.   I again counseled the patient that she should stop smoking.  She  currently is on aspirin 81 mg once a day and Plavix 75 mg once a day.  I  believe she should continue these as well.  Her carotid duplex exam  today showed no significant change with a 60-80% right internal carotid  artery stenosis and a 40-60% left internal carotid artery stenosis.  I  believe risk factor modification is still the best management for her.  If she develops any symptoms we would consider carotid endarterectomy.  Otherwise we will continue carotid surveillance and repeat her carotid  duplex exam in six months' time.   Jessy Oto. Fields, MD  Electronically Signed   CEF/MEDQ  D:  09/22/2008  T:  09/22/2008  Job:  [id number]

## 2011-01-08 NOTE — Assessment & Plan Note (Signed)
[place]                         GASTROENTEROLOGY OFFICE NOTE   Shelley, [name]                    MRN:          [id number]  DATE:08/12/2007                            DOB:          April 16, 1941    SUBJECTIVE:  [name] is a 70 year old white female with Crohn's  colitis, confirmed on recent colonoscopy on July 09, 2007, which  showed multiple aphthous ulcers throughout the colon, with some bleeding  and granularity in the distribution from the rectum to the cecum.  She  has responded to low-dose prednisone 20 mg daily, which she has been  able to decrease to 10 mg daily.  She is asymptomatic.  Her bowel habits  are regular.  No bleeding.  No crampy abdominal pain.   MEDICATIONS:  1. Lialda 4.8 grams daily.  2. Prednisone 10 mg daily.   PHYSICAL EXAMINATION:  VITAL SIGNS:  Blood pressure 124/68, pulse 60,  weight 251 pounds.  The patient was not examined today.   IMPRESSION:  A 70 year old white female with Crohn's colitis, currently  asymptomatic.   PLAN:  1. Continue to take the prednisone by 2.5 mg q.4 weeks.  She should be      off her prednisone in about three months.  2. Continue Lialda 4.8 grams daily.   FOLLOWUP:  I will see her again in three months.     [name]. [name], MD  Electronically Signed    DMB/MedQ  DD: 08/12/2007  DT: 08/12/2007  Job #: [id number]   cc:   Matthias Hughs

## 2011-01-08 NOTE — Assessment & Plan Note (Signed)
[place] OFFICE NOTE   STEWART, PIMENTA                    MRN:          [id number]  DATE:07/30/2007                            DOB:          06-24-1941    PRIMARY CARDIOLOGIST:  Satira Sark, M.D.   PRIMARY CARE PHYSICIAN:  Matthias Hughs, M.D.   REASON FOR VISIT:  Annual followup.   HISTORY OF PRESENT ILLNESS:  [name] is a 70 year old female patient  with a history of coronary artery disease status post inferior ST  elevation myocardial infarction in October of 2004 treated with a Taxus  drug-eluting stent to the RCA.  She presents to the office today for  annual followup.   The patient notes today that she is concerned that she may have had  another heart attack.  She says that she has continued to have bilateral  shoulder and interscapular back pain off and on since her myocardial  infarction.  This continues to occur, and she is concerned by it.  It  has really not changed in its pattern since her myocardial infarction.  When she had her myocardial infarction, she had these symptoms and they  continued until she had angioplasty.  She has not had anything to this  degree since then.  The pain lasts only a couple of minutes.  It is  really not brought on by exertion, and there is no associated shortness  of breath, nausea, or diaphoresis.  She does note some right-sided jaw  discomfort associated with it.  She does note some chest discomfort when  she moves her arms a certain way as well.   She notes chronic dyspnea with exertion.  She attributes this to her  smoking.  She describes NYHA class IIB symptoms.  She is somewhat  limited by her knee arthritis.  She denies orthopnea, PND, or pedal  edema.  She denies any syncope.   The patient is also concerned about episodic diaphoresis.  She is  concerned that she may be going through menopause again.  She has  discussed this with Dr.  Scotty Court who has suggested that she would not be a  candidate for hormone replacement.  She does note some flushing and  lightheadedness with this.  It is uncertain whether or not she has had  palpitations.  She is unsure whether or not her blood pressure goes up  with this.   The patient did see Dr. Oneida Alar of vascular surgery back in January of  2008 for further evaluation of her cerebrovascular disease.  She has 60-  79% bilateral ICA stenosis by carotid Dopplers.  No apparent followup  has been made.  She denies any symptoms consistent with amaurosis fugax  or TIAs.   CURRENT MEDICATIONS:  1. Plavix 75 mg daily.  2. Metoprolol ER 25 mg daily.  3. Aspirin 81 mg daily.  4. Prozac 20 mg daily.  5. Crestor 5 mg daily.  6. Prednisone 10 mg daily.  7. Lialda 1.2 mg 2 tablets daily.  8. Fish oil.  9. Caltrate.  10.Tramadol p.r.n.  11.Nitroglycerin p.r.n.  ALLERGIES:  PENICILLIN.   PHYSICAL EXAMINATION:  GENERAL:  She is a well-developed, well-nourished  female in no acute distress.  VITAL SIGNS:  Blood pressure 117/61, pulse 60.  Weight 248 pounds.  HEENT:  Normal.  NECK:  Without JVD at 90 degrees.  CARDIAC:  Normal S1 and S2.  Regular rate and rhythm without murmurs.  LUNGS:  Faint expiratory wheezes noted diffusely.  Otherwise, clear  without rales.  ABDOMEN:  Soft, nontender.  EXTREMITIES:  Without edema.  NEUROLOGIC:  She is alert and oriented x3.  Cranial nerves II-XII  grossly intact.  ENDOCRINE:  Without thyromegaly.   Electrocardiogram reveals sinus rhythm at a rate of 63.  Normal axis.  Poor R wave progression.  No significant change since tracing done in  2006.   LABORATORY DATA:  Labs done at Dr. Rayna Sexton office in October of 2008  revealed a total cholesterol of 158, triglycerides 64, HDL 75, LDL 70.  Hemoglobin A1c 5.9.  Sodium 140, potassium 4, BUN 15, creatinine 0.76.  ALT 17.   IMPRESSION:  1. Bilateral shoulder pain and chest pain.  2. Episodic  diaphoresis.  3. Dyspnea.  4. Coronary artery disease.      a.     Status post inferior ST elevation myocardial infarction in       October of 2004 treated with a Taxus drug-eluting stent to the       right coronary artery.      b.     Residual coronary artery disease at the time of       catheterization:  30-40% mid left anterior descending artery, 40%       diagonal and second obtuse marginal, 30% proximal.      c.     Nonischemic adenosine Cardiolite with ejection fraction of       50-60% in September of 2006.  5. Preserved left ventricular function.  6. Chronic obstructive pulmonary disease with ongoing tobacco abuse.  7. Hyperlipidemia followed by Dr. Scotty Court.  8. Cereborvascular disease.      a.     60-79% bilateral internal carotid artery stenosis by       Dopplers done in December of 2007.  9. Osteoarthritis.  10.Crohn's disease.  11.Obesity.   PLAN:  The patient presents to the office today for followup.  She is  concerned about her bilateral shoulder pain, as this was a symptom she  had with her myocardial infarction.  It is atypical in that she has  continued to have this since her myocardial infarction without much  change.  It is possible but not probable that she has some microvascular  disease contributing to the symptoms.  She also has symptoms of episodic  diaphoresis that have recently begun.  She is also concerned about this.  She has dyspnea which is probably multifactorial and related to her  obesity, decreased exercise tolerance from her arthritis, and probable  underlying COPD.  She has cerebrovascular disease and needs followup on  that as well.  At this point in time, we plan to:  1. Schedule carotid Dopplers in our [facility] office to follow up on      her cerebrovascular disease.  She has routinely had these done in      our [place] office in the past.  We will await the results of      those and then decide on followup with Dr. Oneida Alar.  2. Schedule  an adenosine Myoview in our [place] office at the time  of her carotid Dopplers to rule out ischemia as the cause for some      of her symptoms.  3. Initiate some additional lab work with her episodic diaphoresis.      We will do a CBC and TSH and free T4.  We will also check a 24-hour      urine to rule out pheochromocytoma (this seems quite unlikely).  4. We will bring the patient back in followup in the next 3-4 weeks      with Dr. Johnny Bridge.  At that point in time, we could consider      initiating a long-acting nitrate or calcium channel blocker as an      antianginal to see if this will alleviate some of her symptoms,      depending on the results of her functional study.  We will also      make a decision regarding followup with Dr. Oneida Alar based upon the      results of her carotid Dopplers.  If the workup for her diaphoresis      is unrevealing, she will likely need to return to see Dr. Scotty Court or      a gynecologist.      Richardson Dopp, PA-C  Electronically Signed      Ernestine Mcmurray, MD,FACC  Electronically Signed   SW/MedQ  DD: 07/30/2007  DT: 07/30/2007  Job #: [id number]   cc:   Matthias Hughs

## 2011-01-08 NOTE — Procedures (Signed)
CAROTID DUPLEX EXAM   INDICATION:  Followup evaluation of known carotid artery disease.   HISTORY:  Diabetes:  No.  Cardiac:  Coronary artery disease.  Hypertension:  No.  Smoking:  Yes.  Previous Surgery:  No.  CV History:  The patient reports no cerebrovascular symptoms at this  time.  Previous duplex on 09/21/2008 revealed 60-79% right ICA stenosis  and a 40-59% left ICA stenosis.  The patient has a history of left arm  paresthesia in January of 2008.  Amaurosis Fugax No, Paresthesias No, Hemiparesis No                                       RIGHT             LEFT  Brachial systolic pressure:         152               160  Brachial Doppler waveforms:         Triphasic         Triphasic  Vertebral direction of flow:        Antegrade         Antegrade  DUPLEX VELOCITIES (cm/sec)  CCA peak systolic                   113               81  ECA peak systolic                   94                86  ICA peak systolic                   288               042  ICA end diastolic                   89                42  PLAQUE MORPHOLOGY:                  Mixed irregular   Calcified  irregular  PLAQUE AMOUNT:                      Moderate          Moderate  PLAQUE LOCATION:                    Proximal ICA      Proximal to mid  ICA   IMPRESSION:  1. 60-79% right ICA stenosis.  2. 40-59% left ICA stenosis.  3. No significant change from previous study performed 09/21/2008.   ___________________________________________  Jessy Oto. Fields, MD   MC/MEDQ  D:  03/22/2009  T:  03/22/2009  Job:  [id number]

## 2011-01-08 NOTE — Procedures (Signed)
CAROTID DUPLEX EXAM   INDICATION:  Follow up carotid artery disease.   HISTORY:  Diabetes:  No.  Cardiac:  CAD.  Hypertension:  No.  Smoking:  Yes.  Previous Surgery:  No.  CV History:  Current asymptomatic.  Amaurosis Fugax No, Paresthesias No, Hemiparesis No.                                       RIGHT             LEFT  Brachial systolic pressure:         178               160  Brachial Doppler waveforms:         Normal            Normal  Vertebral direction of flow:        Antegrade         Antegrade  DUPLEX VELOCITIES (cm/sec)  CCA peak systolic                   125               81  ECA peak systolic                   124               94  ICA peak systolic                   281               846  ICA end diastolic                   84                41  PLAQUE MORPHOLOGY:                  Mixed             Mixed  PLAQUE AMOUNT:                      Moderate          Moderate  PLAQUE LOCATION:                    ICA/ECA/CCA       ICA/ECA/CCA   IMPRESSION:  1. High-end 60-79% stenosis of the right internal carotid artery.  2. 40-59% stenosis of the left internal carotid artery.  3. No significant change noted when compared to the previous      examination on 03/08/08.   ___________________________________________  Jessy Oto. Fields, MD   CH/MEDQ  D:  09/21/2008  T:  09/21/2008  Job:  [id number]

## 2011-01-08 NOTE — Assessment & Plan Note (Signed)
[facility] OFFICE NOTE   DICY, SMIGEL                    MRN:          [id number]  DATE:02/29/2008                            DOB:          09-Nov-1940    PRIMARY CARE PHYSICIAN:  Dr. Matthias Hughs.   REASON FOR VISIT:  Routine followup.   HISTORY OF PRESENT ILLNESS:  Ms. Basley comes back in for a routine  visit.  She reports not having any problems with exertional chest pain  or back pain.  She has moderate bilateral carotid artery disease and is  due to see Dr. Oneida Alar for followup carotid duplex in the near future.  Her most significant stenosis was a 50-70% right internal carotid artery  stenosis in the past.  From a cardiac perspective, she is not reporting  any angina.  She asked me today about Crestor.  She stopped this  medicine and was switched to Pravachol for trial of a generic, although  did not tolerate this, with myalgias within a week.  She is interested  in going back on Crestor and had tolerated this well in the past.   ALLERGIES:  1. PENICILLIN.  2. STATIN intolerance including PRAVASTATIN and SIMVASTATIN at this      point.   MEDICATIONS:  1. Plavix 75 mg p.o. daily.  2. Metoprolol ER 25 mg p.o. daily.  3. Aspirin 81 mg p.o. daily.  4. Omega-3 supplements 1200 mg daily.  5. Caltrate 600 mg p.o. daily.  6. Doxycycline 100 mg p.o. daily.  7. Asacol 400 mg 4 pills p.o. t.i.d.  8. Fluoxetine 20 mg p.o. daily.  9. Vitamin B6 100 mg p.o. daily.  10.Sublingual nitroglycerin p.r.n.   REVIEW OF SYSTEMS:  As per the history of present illness.  She has NYHA  class II dyspnea on exertion.  She is not exercising regularly.  No  palpitations or syncope.  Otherwise negative.   PHYSICAL EXAMINATION:  VITAL SIGNS:  Blood pressure 165/74, heart rate  is 65, weight is 247 pounds.  GENERAL:  This is an obese woman in no acute distress.  HEENT:  Conjunctivae are normal.  Oropharynx is  clear.  NECK:  Supple.  No elevated jugular venous pressure.  No loud bruits.  No thyromegaly is noted.  LUNGS:  Clear with diminished breath sounds, nonlabored breathing at  rest.  CARDIAC:  Regular rate and rhythm.  No S3, gallop, or loud murmur.  ABDOMEN:  Soft, nontender.  Normoactive bowel sounds.  EXTREMITIES:  No significant pitting edema.  MUSCULOSKELETAL:  No kyphosis noted.  NEUROPSYCHIATRIC:  Patient is alert and oriented x3.  Affect is normal.   IMPRESSION AND RECOMMENDATIONS:  1. History of coronary artery disease, status post inferior wall      myocardial infarction in October 2004, treated with a drug-eluting      stent of the right coronary artery.  The patient remains on dual      antiplatelet therapy and is not having any angina at this time.      She had a Myoview study in December 2008, which was overall low  risk demonstrating a partially reversible inferior wall defect that      is being managed medically at this time with an ejection fraction      of 52% and good symptom control.  We will plan to see her back in 6      months in this respect.  2. Bilateral carotid artery disease, moderate, and followed by Dr.      Oneida Alar.  She is due to see him back in the near future for a      followup carotid artery duplex.  Most significant stenosis was a      50% to 70% right internal carotid artery stenosis.  She is on dual      antiplatelet therapy and statin therapy, although does continue to      smoke.  3. Hyperlipidemia.  We have planned to resume Crestor at 5 mg daily.      She will need a followup lipid profile and liver function tests in      12 weeks.     Satira Sark, MD  Electronically Signed    SGM/MedQ  DD: 02/29/2008  DT: 03/01/2008  Job #: [id number]   cc:   Matthias Hughs

## 2011-01-08 NOTE — Procedures (Signed)
CAROTID DUPLEX EXAM   INDICATION:  Carotid stenosis.   HISTORY:  Diabetes:  No.  Cardiac:  No.  Hypertension:  No.  Smoking:  Yes.  Previous Surgery:  No.  CV History:  Currently asymptomatic.  Amaurosis Fugax No, Paresthesias No, Hemiparesis No                                       RIGHT             LEFT  Brachial systolic pressure:         156               152  Brachial Doppler waveforms:         Normal            Normal  Vertebral direction of flow:        Antegrade         Antegrade  DUPLEX VELOCITIES (cm/sec)  CCA peak systolic                   100               75  ECA peak systolic                   93                83  ICA peak systolic                   224               446  ICA end diastolic                   66                46  PLAQUE MORPHOLOGY:                  Mixed             Mixed  PLAQUE AMOUNT:                      Moderate          Moderate  PLAQUE LOCATION:                    ICA / ECA / CCA   ICA / ECA / CCA   IMPRESSION:  1. Doppler velocity suggests a 60%-79% stenosis of the right proximal      internal carotid artery and a 40%-59% stenosis of the left proximal      to mid internal carotid artery.  2. No significant change in Doppler velocities when compared to      previous exams.   ___________________________________________  Jessy Oto Fields, MD   CH/MEDQ  D:  04/26/2010  T:  04/26/2010  Job:  [id number]

## 2011-01-10 ENCOUNTER — Ambulatory Visit: Payer: Medicare Other | Admitting: Cardiology

## 2011-01-10 ENCOUNTER — Ambulatory Visit (INDEPENDENT_AMBULATORY_CARE_PROVIDER_SITE_OTHER): Payer: Medicare Other | Admitting: Cardiology

## 2011-01-10 ENCOUNTER — Encounter: Payer: Self-pay | Admitting: Cardiology

## 2011-01-10 VITALS — BP 118/72 | HR 69 | Ht 66.0 in | Wt 233.0 lb

## 2011-01-10 DIAGNOSIS — E782 Mixed hyperlipidemia: Secondary | ICD-10-CM

## 2011-01-10 DIAGNOSIS — F17200 Nicotine dependence, unspecified, uncomplicated: Secondary | ICD-10-CM

## 2011-01-10 DIAGNOSIS — I10 Essential (primary) hypertension: Secondary | ICD-10-CM

## 2011-01-10 DIAGNOSIS — I6529 Occlusion and stenosis of unspecified carotid artery: Secondary | ICD-10-CM

## 2011-01-10 DIAGNOSIS — I2510 Atherosclerotic heart disease of native coronary artery without angina pectoris: Secondary | ICD-10-CM

## 2011-01-10 NOTE — Assessment & Plan Note (Signed)
Continue to work on smoking cessation.

## 2011-01-10 NOTE — Patient Instructions (Signed)
Continue all current medications. Your physician recommends that you go to the [facility] for lab work just before your next office visit for fasting lipid panel & liver function test.  A reminder will be mailed. Your physician wants you to follow up in: 6 months.  You will receive a reminder letter in the mail one-two months in advance.  If you don't receive a letter, please call our office to schedule the follow up appointment

## 2011-01-10 NOTE — Assessment & Plan Note (Signed)
Lipid numbers look actually quite good.

## 2011-01-10 NOTE — Assessment & Plan Note (Signed)
Symptomatically stable on medical therapy. Encouraged regular exercise as tolerated. Recent ischemic testing within the last year.

## 2011-01-10 NOTE — Assessment & Plan Note (Signed)
Bilateral moderate disease with continued followup per vascular surgery.

## 2011-01-10 NOTE — Progress Notes (Signed)
Clinical Summary Shelley [name] is a 70 y.o.female presenting for followup. Since last visit she underwent right total knee arthroplasty with Dr. Durward Fortes back in mid February. Discharge summary reviewed. Most recent cardiac testing is noted below.  Followup labs from May 11 showed AST 16, ALT 13, cholesterol 146, triglycerides 111, HDL 65, LDL 59. Reviewed these today.  She did spend a few weeks at the [facility] for rehabilitation, still doing home rehabilitation once a week. She uses a cane to ambulate. Reports gradual improvement in stamina.  Reports appetite has been decreased somewhat. She was able to stop smoking for 6 weeks, however resumed in the setting of psychosocial stress  Allergies  Allergen Reactions  . Mercaptopurine     REACTION: pancreatitis  . Penicillins     REACTION: rash  . Sulfa Antibiotics     Current outpatient prescriptions:aspirin 81 MG tablet, Take 81 mg by mouth daily.  , Disp: , Rfl: ;  Calcium 600-200 MG-UNIT per tablet, Take 1 tablet by mouth daily.  , Disp: , Rfl: ;  clopidogrel (PLAVIX) 75 MG tablet, Take 75 mg by mouth daily.  , Disp: , Rfl: ;  FLUoxetine (PROZAC) 40 MG capsule, Take 40 mg by mouth daily.  , Disp: , Rfl: ;  folic acid (FOLVITE) 1 MG tablet, Take 1 mg by mouth daily.  , Disp: , Rfl:  glycopyrrolate (ROBINUL) 2 MG tablet, Take 2 mg by mouth 2 (two) times daily as needed.  , Disp: , Rfl: ;  HYDROcodone-acetaminophen (LORCET) 10-650 MG per tablet, Take 1 tablet by mouth every 12 (twelve) hours as needed.  , Disp: , Rfl: ;  metoprolol succinate (TOPROL-XL) 25 MG 24 hr tablet, Take 25 mg by mouth daily.  , Disp: , Rfl: ;  Misc Natural Products (OSTEO BI-FLEX ADV TRIPLE ST) TABS, Take 1 tablet by mouth daily.  , Disp: , Rfl:  Omega-3 Fatty Acids (FISH OIL) 1200 MG CAPS, Take 1 capsule by mouth daily.  , Disp: , Rfl: ;  rosuvastatin (CRESTOR) 10 MG tablet, Take 2.5 mg by mouth daily.  , Disp: , Rfl: ;  sulfaSALAzine (AZULFIDINE) 500 MG  tablet, Take 1,500 mg by mouth 2 (two) times daily. , Disp: , Rfl: ;  DISCONTD: FLUoxetine (PROZAC) 10 MG capsule, Take 10 mg by mouth daily.  , Disp: , Rfl:  DISCONTD: glucosamine-chondroitin 500-400 MG tablet, Take 1 tablet by mouth daily.  , Disp: , Rfl: ;  DISCONTD: HYDROcodone-acetaminophen (VICODIN) 5-500 MG per tablet, Take 1 tablet by mouth every 6 (six) hours as needed.  , Disp: , Rfl: ;  DISCONTD: methocarbamol (ROBAXIN) 500 MG tablet, Take 500 mg by mouth 3 (three) times daily as needed.  , Disp: , Rfl:  DISCONTD: nicotine (NICODERM CQ - DOSED IN MG/24 HOURS) 14 mg/24hr patch, Place 1 patch onto the skin daily.  , Disp: , Rfl: ;  DISCONTD: oxyCODONE-acetaminophen (PERCOCET) 5-325 MG per tablet, Take 1 tablet by mouth every 4 (four) hours as needed.  , Disp: , Rfl: ;  DISCONTD: pyridOXINE (VITAMIN B-6) 100 MG tablet, Take 100 mg by mouth daily.  , Disp: , Rfl:  DISCONTD: rivaroxaban (XARELTO) 10 MG TABS tablet, Take 10 mg by mouth daily.  , Disp: , Rfl:   Past Medical History  Diagnosis Date  . COPD (chronic obstructive pulmonary disease)   . Osteoarthritis   . Sinus polyp   . Mixed hyperlipidemia   . Myocardial infarction     IMI 10/04  .  Crohn's disease   . Pancreatitis   . Rectal bleeding   . Hepatic steatosis   . History of oral aphthous ulcers   . Essential hypertension, benign   . TIA (transient ischemic attack)   . History of colonic polyps   . Carotid artery disease     64-40% RICA and LICA 3/47 - Dr. Oneida Alar  . Coronary atherosclerosis of native coronary artery     Managed medically    Social History Shelley [name] reports that she has been smoking Cigarettes.  She has a 20 pack-year smoking history. She has never used smokeless tobacco. Shelley [name] reports that she does not drink alcohol.  Review of Systems Otherwise reviewed and negative.  Physical Examination Filed Vitals:   01/10/11 1506  BP: 118/72  Pulse: 69   Comfortable in no acute distress.  HEENT:  Conjuctivae and lids normal, oropharynx clear with moist mucosa.  Neck: Supple, no elevated JVP, soft right carotid bruit, no thyromegaly or tenderness.  Lungs: Nonlabored breathing at rest. CTA without rales or wheezes.  Cor: PMI nondisplaced. RRR, normal S1/S2. No pathologic systolic murmurs. No S3 or rub.  Ext: No CCE. Distal pulses 2+.  Skin: Warm and dry.  Musculoskeletal: No gross deformities.  Neuropsychiatric: Alert and oriented x3, affect appropriate.   ECG Sinus rhythm with evidence of previous inferior wall infarct.  Studies Lexiscan Cardiolite 06/07/2010: No diagnostic ST segment changes. LVEF 50% with global hypokinesis. Fixed mid to basal inferior defect consistent with scar, no frank ischemia.  Problem List and Plan

## 2011-01-11 NOTE — Assessment & Plan Note (Signed)
[name] OFFICE NOTE   Shelley [name], Shelley [name]                      MRN:          [id number]  DATE:07/28/2006                            DOB:          1941/02/27    PRIMARY CARDIOLOGIST:  Dr. Johnny Bridge.   REASON FOR OFFICE VISIT:  Call to schedule six month followup.   Since last seen here in the clinic in April of this year by Roque Cash,  PA-C the patient continues to do well from a cardiovascular standpoint  with no interim development of signs/symptoms suggestive of unstable  angina pectoris. In fact she states that she has never had chest pain.  She does have history of myocardial infarction and states that this was  associated with bilateral shoulder discomfort. She has not had any such  exertional symptoms. The patient has become limited in her mobility.  However, secondary to development of severe arthritis of the knee she is  considering total knee replacement, but has not decided to do so at this  time.   The patient does have history of carotid artery disease and was to have  had followup carotid Dopplers as scheduled at time of her last office  visit. These were not scheduled, and she has not had any studies done  since September of last year at which time she was seen in followup by  Dr. Sabino Snipes. Continued medical management was recommended at that  time.   The patient was recently placed on low dose Zocor given the high cost of  Crestor, and this is being closely followed by Dr. Scotty Court. Her most  recent lipid profile of a few days ago shows an increase in LDL from 150  to 175 as well as an increase in total cholesterol from 204 to 252.   The patient also continues to smoke. She was given a prescription for  Zyban by Dr. Albertine Patricia last year, but never had the prescription filled. At  this time she is not interested in trying Chantix or other modalities.   CURRENT MEDICATIONS:  1.  Plavix.  2. Coated aspirin 81 daily.  3. Toprol XL 25 daily.  4. Prozac 20 daily.  5. Prednisone 2.5 daily.  6. Asacol 800 t.i.d.  7. Tramadol 50 b.i.d.  8. Simvastatin 20 daily.   PHYSICAL EXAMINATION:  VITAL SIGNS:  Blood pressure 142/80, pulse 64 and  regular, weight 253.  GENERAL:  A 70 year old female in no apparent distress.  NECK:  Palpable bilateral carotid pulses with bilateral bruits, no JVD.  LUNGS:  Faint late inspiratory crackles without wheezes.  HEART:  Regular rate and rhythm (S1, S2). No significant murmurs, rubs  or gallops.  ABDOMEN:  Protuberant, nontender.  EXTREMITIES:  Minimally palpable peripheral pulses with 1+ pedal edema  and venous varicosity noted.  NEUROLOGICAL:  No focal deficits.   IMPRESSION:  1. Coronary artery disease.      a.     Status post acute inferior myocardial infarction/[place]       stenting of right coronary artery October 2004.      b.  Preserved left ventricular function.      c.     Nonischemic adenosine stress Cardiolite with large inferior       defect suggestive of scar; EF 56% September 2006.  2. Chronic obstructive pulmonary disease/ongoing tobacco.  3. Hyperlipidemia.      a.     Followed by Dr. Scotty Court.  4. Moderate cerebrovascular disease.      a.     50% right ICA/60% left ICA stenosis by CT angiogram       September 2006.  5. Severe arthritis of the knees.  6. Crohn's disease.  7. Obesity.   PLAN:  1. Schedule followup Dopplers in [facility] with Dr. Sabino Snipes as      previously recommended.  2. Continue current medication regimen. We spoke at length regarding      whether or not to remain on Plavix, and the patient is more      comfortable to do so given her prior conversations with Dr. Johnny Bridge regarding the subject.  3. I discussed the importance of smoking cessation and suggested      trying Chantix; however, at this point in time the patient is not      prepared to stop smoking tobacco.  4.  Resume cardiology followup with Dr. Johnny Bridge with return visit      in one year.      Gene Serpe, PA-C  Electronically Signed      Ernestine Mcmurray, MD,FACC  Electronically Signed   GS/MedQ  DD: 07/28/2006  DT: 07/29/2006  Job #: [id number]   cc:   Matthias Hughs

## 2011-01-11 NOTE — H&P (Signed)
NAME:  Shelley [name], Shelley [name]                       ACCOUNT NO.:  [id number]   MEDICAL RECORD NO.:  [id number]                   PATIENT TYPE:  INP   LOCATION:  2304                                 FACILITY:  [facility]   PHYSICIAN:  Loretha Brasil. Lia Foyer, M.D.             DATE OF BIRTH:  [birth year]   DATE OF ADMISSION:  06/20/2003  DATE OF DISCHARGE:                                HISTORY & PHYSICAL   HISTORY:  Ms. Ostroff is a 70 year old female, with no previous history of  heart disease, who presents via transfer from [facility], for  evaluation and management of acute inferior myocardial infarction.   The patient presented with a two day history of bilateral scapular pain,  which developed Friday evening. She also noted some associated soreness in  the left chest, but otherwise denied any associated dyspnea or diaphoresis.  There was also some radiation to the jaws. Her symptoms were not exacerbated  by activity over the weekend. However, they continue to persist and she  decided to go to the emergency room on Sunday afternoon.   Admission electrocardiogram revealed ST coving in the inferior leads with  small T-waves. Initial CPK was elevated at 202 with a troponin I of 1.54.  The patient was treated with aspirin, beta blocker, intravenous  nitroglycerin, Lovenox, and Integrilin.   Following transfer this morning she developed nausea, vomiting, and a cool  sweat. She reported that her shoulder and jaw pain had resolved, but she  continued to have left chest soreness. A repeat electrocardiogram more  pronounced ST elevation in the inferior leads with the symmetric T-wave  inversion and 0.5-1 mm ST depression in leads v2 and v3.   ALLERGIES:  Penicillin, Ciprofloxacin.   MEDICATIONS PRIOR TO ADMISSION:  1. Asacol.  2. Celebrex.  3. Prozac.  4. Aspirin 325 mg daily.   PAST MEDICAL HISTORY:  1. Crohn's disease (followed by Dr. Delfin Edis).  2. Osteoarthritis.  3. Melanoma of  the back (status post resection).  4. History of pancreatitis.  5. Questionable history of TIA approximately five years ago.   REVIEW OF SYSTEMS:  Denies any recent exertional chest discomfort or dyspnea  over the past few months; denies orthopnea or paroxysmal nocturnal dyspnea.  Has occasional edema. Denies any previous history of myocardial infarction  or congestive heart failure. Has symptoms of acid reflux, but denies history  of peptic ulcer disease. Denies any overt upper or lower gastrointestinal  bleeding. Denies a history of hypertension. Reports a history of borderline  diabetes. Reports a history of dyslipidemia.   SOCIAL HISTORY:  Married. Two children. 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 Smokes an average of 10 cigarettes a  day since age16. Denies alcohol use.   FAMILY HISTORY:  Mother deceased age 42 - question secondary to MI; history  of diabetes. Father deceased age 76.   LABORATORY DATA:  WBC 10.6, hemoglobin  13.1, hematocrit 38, platelets 282,  sodium 142, potassium 4.0. Glucose 125, BUN 17, creatinine 0.8, INR 1.0.  Serial CPKs 202/28, 272/44. Troponin I 1.54, 1.59.   Chest x-ray [facility]):  Cardiomegaly; no congestive heart  failure.   PHYSICAL EXAMINATION:  VITAL SIGNS:  Blood pressure 95/45, pulse 70,  regular; respirations 18; temperature 97.1, SAO2 100% on 2 L.  GENERAL:  A 70 year old female in no apparent distress.  HEENT:  Normocephalic, atraumatic.  NECK:  Bilateral carotid pulses without bruits.  LUNGS:  Mild, late basilar crackles without wheezes.  HEART:  Regular rate and rhythm. (S1, S2). No murmurs, rubs, or gallops.  ABDOMEN:  Soft, nontender. Intact bowel sounds.  EXTREMITIES:  There are good bilateral femoral, dorsalis pedis pulses. No  bruits, no pedal edema.  NEUROLOGIC:  No focal deficit.   IMPRESSION:  A 70 year old female with no previous history of heart disease,  with multiple cardiac risk  factors, who now presents with evolving inferior  myocardial infarction. The patient has been placed on intravenous  nitroglycerin, Integrilin, Lovenox, aspirin, and beta blocker. She  continues, however, to have symptoms or persistent left chest soreness with  new onset nausea and vomiting, cold sweats, and weakness.   PLAN:  The patient will be transported directly to the cardiac  catheterization lab to undergo urgent diagnostic coronary angiography and  possible percutaneous coronary intervention. The risks/benefits of the  procedure were discussed and the patient is agreeable to proceed.      Gene Serpe, P.A. [facility]                      Thomas D. Lia Foyer, M.D.    GS/MEDQ  D:  06/20/2003  T:  06/20/2003  Job:  [id number]   cc:   Matthias Hughs  [address]  [place]  [place] [address]  Fax: [phone]

## 2011-01-11 NOTE — Assessment & Plan Note (Signed)
[name] OFFICE NOTE   MICKALA, Shelley                    MRN:          [id number]  DATE:07/30/2006                            DOB:          16-May-1941    [name] is a 70 year old white female with Crohn's colitis.  She is  currently in remission on tapering dose of prednisone.  She has been on  prednisone 2.5 mg a day now for 2 weeks, and she is ready discontinue  it.  She is also followed by Dr. Domenic Polite for coronary artery disease  status post MI.  She has hyperlipidemia.  We have discussed restarting  her Celebrex with Dr. Domenic Polite because of her polyarthralgia.  Her joint  pain seems to be the major problem, especially knees.  As the prednisone  is being tapered, her joint pains are flaring up again.  I have put her  on a trial basis on Ultram 50 mg every morning plus 2 Motrin in the  afternoon.  Patient does not feel this is satisfactory.  I had talked to  Dr. Domenic Polite on her last appointment, and we agreed that if the  combination of Ultram and ibuprofen does not work, that we will put her  back on Celebrex, which seems to have controlled her joint pains  effectively.  She will, in the future, need knee replacements.   Today, patient has no GI complaints.  Her stools are regular.  There has  been no blood.  No abdominal pain.  Level of energy is good.   Her GI medications include:  1. Asacol 400 mg twice a day.  2. Tramadol 50 mg p.o. q.a.m.  3. Aspirin 81 mg p.o. daily.  She is also on:  1. Plavix 75 mg daily.  2. Fluoxetine 20 mg p.o. daily.  3. Toprol XL 25 mg p.o. daily.  4. She has not had to use any of her [place]asa suppositories.  5. Prednisone 2.5 mg p.o. daily.   IMPRESSION:  A 70 year old white female with Crohn's colitis, currently  in remission.   PLAN:  1. Restart Celebrex 200 mg p.o. daily.  2. Colonoscopy in September 2008.  This will be 5-year interval.  3. Continue  Ultram 50 mg p.r.n.  4. Stop prednisone after patient has started taking the Celebrex.  I      would like for these      medications to overlap about 3 days.  5. I will see her again in 3 months.     Shelley [name]. [name], MD  Electronically Signed    DMB/MedQ  DD: 07/30/2006  DT: 07/30/2006  Job #: [id number]   cc:   Satira Sark, MD  Matthias Hughs

## 2011-01-11 NOTE — Assessment & Plan Note (Signed)
[place] OFFICE NOTE   Shelley, [name]                    MRN:          [id number]  DATE:10/07/2006                            DOB:          25-May-1941    Shelley [name] is a 70 year old white female with Crohn's colitis who has  had exacerbation of diarrhea in last several weeks.  Two of her family  members also have been having diarrhea and she is suspecting a viral  gastroenteritis being the cause of this diarrhea.  At the same time, she  has been under extreme stress.  Her husband of 40 years asked her to  write separation papers on January 14, which is 3 weeks ago and she has  been quite distressed about the whole thing.  She denies seeing any  blood per rectum but her stools have been very soft and occurring about  7 or 8 times a day, even twice at night.  She has not been eating well  and has lost about 6 pounds.   MEDICATIONS:  1. Asacol 400 mg 3 p.o. b.i.d.  2. Tramadol 50 mg daily.  3. Aspirin 81 mg p.o. daily.  4. Plavix 75 mg p.o. daily.  5. Fluoxetine 20 mg p.o. daily.  6. Toprol XL 25 mg p.o. daily.  7. Celebrex 200 mg p.o. daily.  8. Crestor 5 mg p.o. daily.   PHYSICAL EXAMINATION:  Blood pressure 118/62, pulse 60, weight 243  pounds.  She was mildly distressed, upset.  LUNGS:  Clear to auscultation.  CARDIAC:  Normal S1, S2.  ABDOMEN:  Obese, soft, tender in left lower quadrant and diffusely in  all quadrants.  Normoactive bowel sounds.  No rebound.  No palpable  mass.  Normal perianal area. Rectal tone was normal with tender anal  canal.  Stool was hemoccult negative.   IMPRESSION:  56. A 70 year old white female with history of Crohn's colitis, now      with exacerbation of diarrhea, in the setting of family members      having viral or bacterial gastroenteritis, rule out stress related      irritable bowel syndrome due to separation from her husband.  2. Rule out exacerbation  of Crohn's colitis.  The patient does not      want to get back on prednisone at this time and wants to try other      options.   PLAN:  1. Cipro 250 p.o. b.i.d. for 10 days.  2. Increase Asacol to 12 tablets a day.  Samples given.  3. Robinul Forte 2 mg p.o. b.i.d. as an anti-spasmodic.  4. Alprazolam .25 mg dispense 61 p.o. b.i.d. p.r.n.  5. If no improvement, consider use of steroids and also colonoscopy.      Last colon exam was done in September of 2003.     Shelley [name]. Shelley Perches, MD  Electronically Signed    DMB/MedQ  DD: 10/07/2006  DT: 10/07/2006  Job #: [id number]   cc:   Shelley [name]

## 2011-01-11 NOTE — Discharge Summary (Signed)
NAME:  Shelley [name], Shelley [name]                       ACCOUNT NO.:  [id number]   MEDICAL RECORD NO.:  [id number]                   PATIENT TYPE:  INP   LOCATION:  2019                                 FACILITY:  [facility]   PHYSICIAN:  Kirk Ruths, M.D.                DATE OF BIRTH:  1941/06/28   DATE OF ADMISSION:  06/20/2003  DATE OF DISCHARGE:  06/25/2003                           DISCHARGE SUMMARY - REFERRING   SUMMARY OF HISTORY:  Shelley [name] is a 70 year old white female who was  transferred from [facility] with chest discomfort.  She developed  bilateral scapular discomfort with radiation into her left chest and jaw,  which she describes as a soreness.  She denied any associated nausea,  diaphoresis, or shortness of breath, and since Friday night when the  symptoms started, the symptoms have waxed and waned.  She presented to the  emergency room at [facility] on October 24, at approximately 4 p.m.  secondary to symptoms.  Her EKG, at that time, showed evidence of an  inferior myocardial infarction with CK and troponin already elevated.  She  was treated with IV nitroglycerin, Lovenox, Integrilin, and morphine, and IV  Lopressor.  She continued to have residual soreness.   Her history is notable for Crohn's and she is followed by Dr. Delfin Edis,  as well as osteoarthritis, melanoma on her back, pancreatitis,  hyperlipidemia, tobacco use, and early family history.   LABORATORY DATA:  At [facility] initial H&H was 13.1 and 38.0 with  normal indices, platelets 282, WBC 10.6.  Sodium was 142, potassium 4.0, BUN  17, creatinine 0.8, glucose 125.  Initial total CK was 202 with an MB of  28.4, relative index of 14.0, and a troponin of 1.54.  The second CK was 272  with an MB of 44.4, relative index 16.3, and troponin 1.59.  On transfer to  [facility], TSH was 2.708, H&H was 11.8 and 36.3, normal indices, platelets of  233, WBC 9.6.  Sodium 135, potassium 4.8, BUN 21,  creatinine 0.7, glucose  128.  Hemoglobin A1c was 5.8.  Subsequent hematologies and chemistries were  essentially unchanged.  Subsequent CK total, MB, at [facility] was 2251  with an MB of 392.6, relative index 17.4.  Subsequent MBs and relative  indexes were declining.  Fasting lipids on October 26, showed a total  cholesterol 160, triglycerides 119, HDL 47, LDL 89.  Chest x-ray, at  [facility], showed stable cardiomegaly.  Echocardiogram showed EF of  40-50%, akinesis of the anteroseptal wall, mildly decreased LV function,  mild MAC with mild MR.   The EKGs at [facility] showed normal sinus rhythm, persistent inferior ST-  segment elevation with T-wave inversion and Q waves indicative of an  evolving inferior myocardial infarction.   HOSPITAL COURSE:  Shelley [name] was transferred from [facility] to [facility] for cardiac catheterization.  This was performed on October 25,  by  Dr. Lia Foyer.  According to Dr. Maren Beach progress note, her EF was 60% with  inferobasal akinesis, 2 to 3+ MR, 30-40% proximal LAD, 40% diagonal 1, 30%  circumflex.  She had a 100% proximal RCA lesion.  Dr. Lia Foyer, utilizing an  Express stenting x2, reduced the RCA lesion from 100% to 0% without  difficulty.  Dr. Lia Foyer initially noted that she should be on Plavix for  four weeks and ordered an echocardiogram.  On October 26, she had not had  any further discomfort and she felt much better.  Medications were adjusted.  Cardiac rehabilitation began assisting with education and ambulation.  Her  hypoglycemia was felt to be secondary to her myocardial infarction and her  hemoglobin A1c was within normal limits.  She did developed slight cough and  low grade fever; however, there was no evidence of myocardial rupture or  pericarditis.  Discharge planning was started.  She was transferred to  telemetry where she began ambulating.  After review on June 25, 2003, Dr.  Vicenta Aly felt that she  could be discharged home.   DISCHARGE DIAGNOSES:  1. Acute inferior myocardial infarction status post angioplasty stenting, as     previously described, to the proximal right coronary artery.  2. Tobacco use.  3. Hyperglycemia felt to be secondary to myocardial infarction.  4. History as previously described.   DISPOSITION:  Mr. Temkin is discharged home.   DISCHARGE MEDICATIONS:  Her new medications include a prescription for  Plavix 75 mg daily for nine months, per Dr. Vicenta Aly.  She was asked to  continue her coated aspirin 325 mg daily.  She also received new  prescriptions for:  1. Toprol XL 25 mg daily  2. Zocor 40 mg q.h.s.  3. Nitroglycerin as needed.   DISCHARGE INSTRUCTIONS:  Dr. Vicenta Aly told her not to take her Celebrex  unless she absolutely needed it.  She was given permission to continue her  Asacol and Prozac as previously.  She was advised no lifting, driving,  sexual activity, heavy exertion, or working until seen by the physician.  Maintain low-salt-fat-cholesterol diet, and if she had any problems with her  catheterization site to call.  She was advised no smoking or tobacco  products.  She will see Dr. Domenic Polite on November 15, at 11:30 a.m. for  followup.  At the time of followup, cardiac risk factor modification should  be reinforced.  She should be enrolled in Phase II of cardiac  rehabilitation.  Arrangements for a fasting lipid profile and LFTs, in  approximately 6-8 weeks, should be arranged since Zocor was initiated.       Sharyl Nimrod, P.A. [facility]                    Kirk Ruths, M.D.    EW/MEDQ  D:  06/25/2003  T:  06/25/2003  Job:  [id number]   cc:   Dr. Bryson Dames  [address], [address]  [place], [place]  [address]  [place]  [place] [address]  Fax: [phone]   Delfin Edis, M.D. [facility]

## 2011-01-11 NOTE — Assessment & Plan Note (Signed)
[facility] [place] OFFICE NOTE   AVELEEN, NEVERS                    MRN:          [id number]  DATE:05/07/2006                            DOB:          1941-06-15    Ms. Libman is a 70 year old white female with Crohn's colitis, initially  diagnosed in 1998.  She has frequent flare-ups of her disease.  Unfortunately, she developed pancreatitis after using 6-Mercaptopurine and  this had to be discontinued.  She also has coronary artery disease and has  been on Plavix and aspirin.  Ms. Oconnor has severe degenerative joint  disease of both knees and was, in the past, on Celebrex 200 mg twice a day,  but this was discontinued because of possible relationship to her heart  attacks.  In place of it, she started taking Motrin 800 mg 3-4 times a day.  After several months of taking it, she developed two episodes of severe  rectal bleeding, both this summer.  I was out of town and my office handled  this by sending her Canasa suppositories.  We have also put her on  prednisone 20 mg a day about three weeks ago, which seemed to have helped  the arthritic complaints as well as the Crohn's colitis, which has flared  up.  For the past three weeks, the diarrhea has slowed down significantly  and there has been no recurrence of the rectal bleeding.  The patient still  wishes she could be on Celebrex because she had to discontinue the Motrin  and now is dependent solely on the prednisone to control her arthritic  complaints.   PHYSICAL EXAM:  GENERAL:  Blood pressure 122/74, pulse 64, and weight 246  pounds.  She was alert and oriented, no distress.  LUNGS:  Clear to auscultation.  COR:  Normal S1 and S2.  ABDOMEN:  Soft with decreased muscle tone, tender in the left lower  quadrant.  Right lower and upper quadrants were unremarkable.  RECTAL:  Exam shows today soft, hemoccult-negative stool.   IMPRESSION:  72. A  70 year old white female with flare-up of Crohn's colitis, rectal      bleeding aggravated by anticoagulation with Plavix, aspirin, as well as      Motrin.  2. Coronary artery disease.  3. Intolerant to 6-Mercaptopurine.   PLAN:  1. Increase Asacol to 400 mg 6 tablets a day in 2 divided doses.  2. Continue prednisone now at 15 mg daily for two weeks, 10 mg daily for      two weeks, 5 mg daily until she sees me in about 6-8 weeks.  3. CBC and sed rate today.  4. I will discuss with Dr. Johnny Bridge the possibility of her going back      on Celebrex to avoid      using prednisone for control of her joint pains.  She cannot take      Motrin at this time because of the bleeding.  [name]. [name], MD   DMB/MedQ  DD:  05/07/2006  DT:  05/07/2006  Job #:  [id number]   cc:   Brantley Stage, MD

## 2011-01-11 NOTE — Assessment & Plan Note (Signed)
[facility] OFFICE NOTE   Shelley [name], Shelley [name]                    MRN:          [id number]  DATE:11/04/2006                            DOB:          29-Oct-1940    Shelley [name] is a 70 year old patient of Dr. Scotty Court with Crohn's colitis,  recent exacerbation, responding well to increased doses of prednisone  and mesalamine.  Since her last appointment four weeks ago, she has been  on prednisone 30 mg a day with complete resolution of all of her  symptoms, specifically diarrhea, bleeding and abdominal pain.  Her level  of energy has improved.  She has gained only three pounds.  She is ready  to taper  down  her prednisone.   MEDICATIONS:  1. Completed Cipro 250 p.o. b.i.d. for 10 days.  2. Asacol 400 mg, initially 4.8 grams per day, now down to 3.2 grams      per day.  3. Robinul Forte 2 mg.  Patient discontinued it, after her symptoms of      cramping resolved.  4. Alprazolam 0.25 mg, 1/2 tablet several times per week p.r.n.  5. Prednisone 30 mg daily.   PHYSICAL EXAMINATION:  VITAL SIGNS:  Blood pressure 130/70, pulse 60 and  weight 246 pounds.  GENERAL:  She was alert, oriented, in no distress.  Patient was not  examined today.   LABORATORY DATA:  Dr. Scotty Court did lab tests on October 13, 2006 showing  normal hemoglobin of 13.0, hematocrit 38.1, MC of 87.  Her sed rate at  that time was 40, likely related to activity of her colitis.  Her liver  function tests were normal.  Hemoglobin A1c was 6, and her cholesterol  total was 132 with a DL of 67 on Crestor 5 mg daily.   IMPRESSION:  A 70 year old white female with Crohn's colitis currently  under good control on a tapering dose of prednisone.   PLAN:  1. Continue to decrease her prednisone to 20 mg a day for two weeks,      15 mg for two weeks, and then by 5 mg every two weeks until 2-1/2      mg.  She should be off prednisone by mid-May 2008.  2.  Continue Asacol 3.2 grams per day, samples given.  3. Colonoscopy in September 2008.  I will see her again at the end of      May of this year.     Shelley [name]. Shelley Perches, MD  Electronically Signed    DMB/MedQ  DD: 11/04/2006  DT: 11/04/2006  Job #: [id number]   cc:   Matthias Hughs

## 2011-01-11 NOTE — Cardiovascular Report (Signed)
NAME:  Shelley [name], Shelley [name]                       ACCOUNT NO.:  [id number]   MEDICAL RECORD NO.:  [id number]                   PATIENT TYPE:  INP   LOCATION:  2304                                 FACILITY:  [facility]   PHYSICIAN:  Loretha Brasil. Lia Foyer, M.D.             DATE OF BIRTH:  06/06/1941   DATE OF PROCEDURE:  06/20/2003  DATE OF DISCHARGE:                              CARDIAC CATHETERIZATION   INDICATIONS:  Shelley [name] is a pleasant 70 year old woman who presents with  chest pain.  This started on Friday night.  She ultimately came into the  emergency room on Sunday night.  Enzymes were positive and she had some  inferior Qs with ST elevation residually.  She had discomfort radiating to  the back.  She now has nausea.  She was transferred for urgent evaluation.  She was seen in the surgical intensive care unit and cardiac catheterization  recommended.   PROCEDURE:  1. Left heart catheterization.  2. Selective coronary arteriography.  3. Selective left ventriculography.  4. Temporary transvenous pacemaker insertion.  5. Percutaneous coronary intervention of the right coronary artery.   DESCRIPTION OF PROCEDURE:  The patient was brought to the catheter  laboratory and prepped and draped in the usual fashion.  Through an anterior  puncture the right femoral artery was easily entered.  6-French sheath was  then placed.  Views of the left and right coronary arteries were obtained in  multiple angiographic projections.  Ventriculography was performed in the  RAO projection.  I then reviewed the films in detail with Eustace Quail, M.D.  It was our recommendation that despite the time frame there appeared to be  some motion of the inferior wall with also some mitral regurgitation.  It  was felt that an attempt at percutaneous intervention should be undertaken  since the left system did not appear to be severely diseased.  Reperfusion  therapy was then recommended using percutaneous  intervention.  The 6-French  sheath was exchanged for a 7-French sheath.  A femoral vein sheath was  placed and temporary venous pacer placed in the inferior vena cava for  backup pacing if necessary.  A JR4 guiding catheter was utilized and heparin  was given according to the protocol.  The patient was previously on  eptifibatide.  A JR4 with side holes was then used.  A 0.014 high-torque  floppy was taken down to the lesion, but would not cross completely.  With a  2 mm balloon the wire went down distally and reperfusion was achieved.  Inflations were performed with a 2 mm balloon and 2.5 mm balloon.  Subsequently, a 2.75 mm Taxus non drug-eluting stent was placed in the mid  right coronary.  We placed a non drug-eluting stent largely because the  patient has had some recent bleeding related to her Crohn's disease.  She  has some intermittently bleeding and has not been severe but there was  concern about whether or not she would be able to tolerate a long duration  of Plavix therapy.  Based on this the lesion was then stented.  There  appeared to be some disease just beyond the distal stent site so a second  overlapping stent was placed.  The initial stent was post dilated with 3 mm  Quantum Maverick balloon.  In the distal stent the distal stent was deployed  and then the balloon pulled back slightly and then taken up to 17  atmospheres.  There was marked improvement in the appearance of the artery.  We did give some intracoronary verapamil to improve distal perfusion.  Also,  some intracoronary nitroglycerin was administered.  The patient tolerated  the procedure well.  There was some bradycardia with a heart rate of about  50.  There was also some AV block and therefore some atropine was  administered.  We elected to put the temporary pacing wire in as backup  pacing in case the patient needed it.  Subsequently, the pacing wire and the  femoral sheaths were sewn into place.  The  patient was taken to the holding  area in satisfactory clinical condition.   HEMODYNAMIC DATA:  1. Central aorta 116/70, mean 89.  2. Left ventricle 136/32.  3. No aortic to left ventricular gradient on pullback across the aortic     valve.   ANGIOGRAPHIC DATA:  1. Ventriculography was performed in the RAO projection.  The inferobasal     segment appeared to be severely hypokinetic.  However, ejection fraction     was calculated at 60%.  There was 2-3+ mitral regurgitation.  2. The left main coronary artery was free of critical disease.  3. The LAD courses to the apex and there is about 30-40% mid LAD     irregularity.  There is also about 40% involvement of the diagonal.     Neither of these appear to be critical.  4. The circumflex provides two major marginal branches, an atrial     circumflex, and a distal posterolateral branch.  The second marginal     perhaps has 30% proximal narrowing.  5. The right coronary artery is totally occluded in its mid portion.     Following reperfusion therapy this is reduced to 0% residual luminal     narrowing.  There is mild distal irregularity.  PDA and posterolateral     branch are both quite large.   CONCLUSIONS:  1. Acute inferior myocardial infarction greater than 24 hours.  2. Secondary mitral regurgitation thought to be due to ischemic mitral     regurgitation possibly due to papillary muscle dysfunction.  3. Total occlusion of the right coronary artery with successful reperfusion     therapy.   DISPOSITION:  The patient will be treated with aspirin and Plavix.  Cardiac  rehabilitation will be recommended.  I have explained to the patient that  given her delayed presentation that the risks and complications are  increased.  We will also need to monitor the severity of her mitral  regurgitation to see if this improves.  Whether this is ischemic or related  to infarcted papillary muscle will likely determine improvement.  The patient  will need close follow-up and we will be arranging this with Satira Sark, M.D. [facility] in [place].  Loretha Brasil. Lia Foyer, M.D.    TDS/MEDQ  D:  06/20/2003  T:  06/20/2003  Job:  [id number]   cc:   Rory Percy  [place] [address]  Fax: [phone]   Delfin Edis, M.D. [place]  [place]  [place] [address]  Fax: [phone]   Satira Sark, M.D. [facility]

## 2011-01-11 NOTE — Assessment & Plan Note (Signed)
[facility] [place] OFFICE NOTE   JAMACIA, JESTER                    MRN:          [id number]  DATE:06/20/2006                            DOB:          02/13/1941    Mrs. Obi is a 70 year old white female with Crohn's colitis who has  severe degenerative joint disease of her knees. She also has coronary artery  disease, status post diaphragmatic MI and bare metal stent placement in  2004.  She had a flare up of rectal bleeding and colitis after taking large  doses of Motrin for her knee pain after discontinuation of the Celebrex.  The Celebrex was stopped to avoid cardiovascular problems.  She has been on  Isocal 400 mg up to 12 tablets a day and has been able to cut back to six  tablets a day to maintain remission.  Currently has no GI symptoms of  diarrhea, abdominal pain or rectal bleeding.  She came to discuss whether  she could get back on Celebrex.   I have spoken to Dr. Domenic Polite about the risks of cardiovascular disease with  Celebrex which is slightly increased but the exact risk is not known.  It is  preferable not to use Celebrex in patients with ischemic heart disease.  We  have several other options with Mrs. Loveall.  One is to keep her on low dose  prednisone.  She is currently on 7.5 mg a day and is ready to go down to 5  mg in the next two weeks.  Then she will decrease to alternating 5 mg and  2.5 mg a day.  Her knees have been doing quite well on the low dose  prednisone and it is an option for her to day on it.  Also I have given her  a prescription for Ultram 50 mg to be taken on p.r.n. basis on rainy days  when her knees are really bothering her when she has to use a cane.  The  other possibility would be switch her from Asacol to Azulfidine which has  some arthritic benefits.  However, she had a rash with Azulfidine in the  past and in order to try to Azulfidine again, we would have to  desensitize  her with small doses such as 50 mg and slowly increase it up to 500 mg  several times a day.  This could take up to seven months to get there and I  am not sure she could really tolerate Azulfidine.   Other options would be to have her knee replaced.   Mrs. Sax will try taking her low dose prednisone as well as Ultram and  not try Celebrex at this time.  I will see her again in three months.     [name]. [name], MD    DMB/MedQ  DD: 06/20/2006  DT: 06/21/2006  Job #: [id number]   cc:   Satira Sark, MD  Matthias Hughs

## 2011-02-11 ENCOUNTER — Telehealth: Payer: Self-pay | Admitting: Cardiology

## 2011-02-11 MED ORDER — CLOPIDOGREL BISULFATE 75 MG PO TABS: 75.0000 mg | ORAL_TABLET | Freq: Every day | ORAL | Status: DC

## 2011-02-11 NOTE — Telephone Encounter (Signed)
.   Requested Prescriptions   Signed Prescriptions Disp Refills  . clopidogrel (PLAVIX) 75 MG tablet 30 tablet 6    Sig: Take 1 tablet (75 mg total) by mouth daily.    Authorizing Provider: DE Lindwood Qua    Ordering User: Laurence Compton

## 2011-02-21 ENCOUNTER — Other Ambulatory Visit: Payer: Self-pay | Admitting: Internal Medicine

## 2011-05-13 ENCOUNTER — Encounter: Payer: Self-pay | Admitting: Thoracic Diseases

## 2011-05-22 ENCOUNTER — Other Ambulatory Visit: Payer: Self-pay | Admitting: Internal Medicine

## 2011-05-22 MED ORDER — SULFASALAZINE 500 MG PO TABS: 1500.0000 mg | ORAL_TABLET | Freq: Two times a day (BID) | ORAL | Status: DC

## 2011-05-22 NOTE — Telephone Encounter (Signed)
rx sent

## 2011-05-25 ENCOUNTER — Other Ambulatory Visit: Payer: Self-pay | Admitting: Internal Medicine

## 2011-05-27 ENCOUNTER — Telehealth: Payer: Self-pay | Admitting: Internal Medicine

## 2011-05-27 NOTE — Telephone Encounter (Signed)
Advised pt rx was already sent. I have also scheduled her for next available office visit.

## 2011-06-12 ENCOUNTER — Other Ambulatory Visit: Payer: Self-pay | Admitting: *Deleted

## 2011-06-12 DIAGNOSIS — Z79899 Other long term (current) drug therapy: Secondary | ICD-10-CM

## 2011-06-12 DIAGNOSIS — E785 Hyperlipidemia, unspecified: Secondary | ICD-10-CM

## 2011-06-19 ENCOUNTER — Other Ambulatory Visit: Payer: Self-pay | Admitting: Cardiology

## 2011-07-02 ENCOUNTER — Encounter: Payer: Self-pay | Admitting: Internal Medicine

## 2011-07-02 ENCOUNTER — Ambulatory Visit (INDEPENDENT_AMBULATORY_CARE_PROVIDER_SITE_OTHER): Payer: Medicare Other | Admitting: Internal Medicine

## 2011-07-02 VITALS — BP 120/70 | HR 64 | Ht 66.0 in | Wt 235.8 lb

## 2011-07-02 DIAGNOSIS — D689 Coagulation defect, unspecified: Secondary | ICD-10-CM

## 2011-07-02 DIAGNOSIS — K5010 Crohn's disease of large intestine without complications: Secondary | ICD-10-CM

## 2011-07-02 MED ORDER — MESALAMINE 1.2 G PO TBEC: DELAYED_RELEASE_TABLET | ORAL | Status: DC

## 2011-07-02 NOTE — Patient Instructions (Addendum)
Call us when you run out of Lialda and we will give you another sample bottle. CC: Dr Scotty Court, Dr Domenic Polite

## 2011-07-02 NOTE — Progress Notes (Signed)
SUMMERLYN FICKEL 05-03-1941 MRN [id number]   History of Present Illness:  This is a 70 year old white female that has had Crohn's colitis since 1998. The last office visit was in October 2011. She has been in remission. She denies abdominal pain or diarrhea. Her last colonoscopy in November 2008 showed villotubular adenoma and focal active colitis with patchy foci of active neutrophilic inflammation. There were erosions of surface epithelium, minimal crypt distortion and no granulomas. She is allergic to 6-MP which causes pancreatitis. She could not afford mesalamine so she is on sulfasalazine 1 g 3 times a day and folic acid 1 mg daily. She has a history of coronary artery disease and has been on Plavix. She had a total knee replacement in March 2012.   Past Medical History  Diagnosis Date  . COPD (chronic obstructive pulmonary disease)   . Osteoarthritis   . Sinus polyp   . Mixed hyperlipidemia   . Regional enteritis of large intestine   . Pancreatitis   . Rectal bleeding   . Hepatic steatosis   . History of oral aphthous ulcers   . Essential hypertension, benign   . TIA (transient ischemic attack)   . Hx of adenomatous colonic polyps   . Carotid artery disease     96-28% RICA and LICA 3/66 - Dr. Oneida Alar  . Coronary atherosclerosis of native coronary artery     Managed medically  . Myocardial infarction     IMI 10/04  . History of Salmonella gastroenteritis    Past Surgical History  Procedure Date  . Right total knee arthroplasty 2/12  . Melanoma resection 1996  . Cataract extraction     left eye    reports that she has been smoking Cigarettes.  She has a 20 pack-year smoking history. She has never used smokeless tobacco. She reports that she does not drink alcohol or use illicit drugs. family history includes Diabetes type II in her mother and Heart attack (age of onset:64) in her mother.  There is no history of Colon cancer. Allergies  Allergen Reactions  . Mercaptopurine      REACTION: pancreatitis  . Sulfa Antibiotics   . Penicillins Rash    REACTION: rash        Review of Systems: Denies reflux symptoms shortness of breath chest pain  The remainder of the 10 point ROS is negative except as outlined in H&P    Assessment and Plan:  This is a 70 year old white female with Crohn's colitis in remission who is here to refill her medications. I have given her samples of Lialda 1.2 g. She will take 2 a day. She is followed by Dr. Scotty Court and Dr Gwen Her.  We will see her in a year unless she has a flareup.   07/02/2011 Delfin Edis

## 2011-07-03 ENCOUNTER — Ambulatory Visit: Payer: Medicare Other

## 2011-07-03 ENCOUNTER — Other Ambulatory Visit: Payer: Medicare Other

## 2011-07-10 ENCOUNTER — Encounter: Payer: Self-pay | Admitting: Cardiology

## 2011-07-11 ENCOUNTER — Other Ambulatory Visit: Payer: Self-pay | Admitting: *Deleted

## 2011-07-11 ENCOUNTER — Encounter: Payer: Self-pay | Admitting: Physician Assistant

## 2011-07-11 MED ORDER — METOPROLOL SUCCINATE ER 25 MG PO TB24: 25.0000 mg | ORAL_TABLET | Freq: Every day | ORAL | Status: DC

## 2011-07-12 ENCOUNTER — Ambulatory Visit (INDEPENDENT_AMBULATORY_CARE_PROVIDER_SITE_OTHER): Payer: Medicare Other | Admitting: Physician Assistant

## 2011-07-12 ENCOUNTER — Ambulatory Visit (INDEPENDENT_AMBULATORY_CARE_PROVIDER_SITE_OTHER): Payer: Medicare Other | Admitting: *Deleted

## 2011-07-12 ENCOUNTER — Encounter: Payer: Self-pay | Admitting: Physician Assistant

## 2011-07-12 VITALS — BP 140/55 | HR 56 | Resp 16 | Ht 66.0 in | Wt 236.7 lb

## 2011-07-12 DIAGNOSIS — I6529 Occlusion and stenosis of unspecified carotid artery: Secondary | ICD-10-CM

## 2011-07-12 NOTE — Progress Notes (Signed)
[facility] [place] HISTORY AND PHYSICAL   CC:  Follow up carotid duplex scan  Referring Provider:  Deloria Lair., MD  HPI: This is a 70 y.o. female here for f/u carotid duplex scan. She denies any symptoms of stroke such as amaurosis fugax, slurred speech, hemiparesis, or paresthesias.  She does continue to smoke and states that she has too much stress in her life to quit.  Otherwise, her ROS is negative.  Past Medical History  Diagnosis Date  . COPD (chronic obstructive pulmonary disease)   . Osteoarthritis   . Sinus polyp   . Mixed hyperlipidemia   . Regional enteritis of large intestine   . Pancreatitis   . Rectal bleeding   . Hepatic steatosis   . History of oral aphthous ulcers   . Essential hypertension, benign   . TIA (transient ischemic attack)   . Hx of adenomatous colonic polyps   . Carotid artery disease     08-14% RICA and LICA 4/81 - Dr. Oneida Alar  . Coronary atherosclerosis of native coronary artery     Managed medically  . Myocardial infarction     IMI 10/04  . History of Salmonella gastroenteritis    Past Surgical History  Procedure Date  . Right total knee arthroplasty 2/12  . Melanoma resection 1996  . Cataract extraction     left eye    Allergies  Allergen Reactions  . Mercaptopurine     REACTION: pancreatitis  . Sulfa Antibiotics   . Penicillins Rash    REACTION: rash    Current Outpatient Prescriptions  Medication Sig Dispense Refill  . aspirin 81 MG tablet Take 81 mg by mouth daily.        . Calcium 600-200 MG-UNIT per tablet Take 1 tablet by mouth daily.        . clopidogrel (PLAVIX) 75 MG tablet Take 1 tablet (75 mg total) by mouth daily.  30 tablet  6  . CRESTOR 10 MG tablet TAKE ONE TABLET BY MOUTH AT BEDTIME  30 tablet  12  . FLUoxetine (PROZAC) 40 MG capsule Take 40 mg by mouth daily.        Marland Kitchen glycopyrrolate (ROBINUL) 2 MG tablet Take 2 mg by mouth 2 (two) times daily as needed.        Marland Kitchen  HYDROcodone-acetaminophen (LORCET) 10-650 MG per tablet Take 1 tablet by mouth every 12 (twelve) hours as needed.        . mesalamine (LIALDA) 1.2 G EC tablet Take 2 tablets by mouth once daily  90 tablet  0  . metoprolol succinate (TOPROL-XL) 25 MG 24 hr tablet Take 1 tablet (25 mg total) by mouth daily.  30 tablet  6  . Misc Natural Products (OSTEO BI-FLEX ADV TRIPLE ST) TABS Take 1 tablet by mouth daily.        . Omega-3 Fatty Acids (FISH OIL) 1200 MG CAPS Take 1 capsule by mouth daily.        . folic acid (FOLVITE) 1 MG tablet TAKE ONE TABLET BY MOUTH EVERY DAY  100 tablet  0    Family History  Problem Relation Age of Onset  . Diabetes type II Mother   . Heart attack Mother 78    questionable  . Cancer Mother     uterine  . Diabetes Mother   . Heart disease Mother   . Colon cancer Neg Hx   . Other Brother     PVD and hx DVT  History   Social History  . Marital Status: Divorced    Spouse Name: N/A    Number of Children: 2  . Years of Education: N/A   Occupational History  . Retired   .     Social History Main Topics  . Smoking status: Current Everyday Smoker -- 0.5 packs/day for 40 years    Types: Cigarettes  . Smokeless tobacco: Never Used  . Alcohol Use: No  . Drug Use: No  . Sexually Active: Not on file   Other Topics Concern  . Not on file   Social History Narrative  . No narrative on file     ROS: _0  Positive   _1  Negative-see HPI   _2  All sytems reviewed and are negative  General: _3  Weight loss, _4  Fever, _5  chills Neurologic: _6  Dizziness, _7  Blackouts, _8  Seizure _9  Stroke, _10  "Mini stroke", _11  Slurred speech, _12  Temporary blindness; _13  weakness in arms or legs Cardiac: _14  Chest pain/pressure, _15  Shortness of breath at rest _16  Shortness of breath with exertion, _17  Atrial fibrillation or irregular heartbeat Vascular: _18  Pain in legs with walking, _19  Pain in legs at rest, _20  Pain in legs at night,  _21  Non-healing ulcer, _22   Blood clot in vein/DVT,   Pulmonary: _23  Home oxygen, _24  Productive cough, _25  hemoptysis, _26  Asthma,  _27  Wheezing Musculoskeletal:  _28  Arthritis, _29 ; _30  Joint pain Hematologic: _31  Easy Bruising, _32  Anemia; _33  Hepatitis Gastrointestinal: _34  Melena, _35  Hx of stomach ulcers;  _36  Gastroesophageal Reflux/heartburn, _37  Trouble swallowing Urinary: _38  chronic Kidney disease, _39  on HD - _40  MWF or _41  TTHS, _42  Burning with urination, _43  Difficulty urinating; _44  Hematuria Skin: _45  Rashes, _46  Wounds Psychological: _47  Anxiety, _48  Depression   PHYSICAL EXAMINATION:  Filed Vitals:   07/12/11 1414  BP: 140/55  Pulse: 56  Resp: 16   Body mass index is 38.20 kg/(m^2).  General:  WDWN in NAD Gait: Normal HENT: WNL Eyes: PERRL Pulmonary: normal non-labored breathing , without Rales, rhonchi,  wheezing Cardiac: RRR, without  Murmurs, rubs or gallops; + bilateral carotid bruits Abdomen: soft, NT, no masses Skin: no rashes, ulcers noted Vascular Exam/Pulses: 2+ radial pulses bilaterally Extremities: without ischemic changes, no Gangrene , no cellulitis; no open wounds;  Musculoskeletal: no muscle wasting or atrophy; + BLE edema right > left Neurologic: A&O X 3; Appropriate Affect ; SENSATION: normal; MOTOR FUNCTION:  moving all extremities equally. Speech is fluent/normal  Non-Invasive Vascular Imaging: Carotid Duplex Scan: 1.  Stable 60-79% bilateral ICA stenosis  Right >Left 2.  Bilateral Vertebral Arteries WNL  ASSESSMENT: 70 y.o. female here for f/u carotid duplex scan. Doing well and asymptomatic.  Stable exam from May 2012.  PLAN: We will have her return in 6 months to keep a close watch on her carotid occlusive disease since she is in the 60-79% range.  I did discuss briefly with her the importance of tobacco cessation and she is not ready to quit at this time.  Evorn Gong, PA-C [facility] [phone]  Clinic MD:   Bridgett Larsson

## 2011-07-16 ENCOUNTER — Encounter: Payer: Self-pay | Admitting: Cardiology

## 2011-07-16 ENCOUNTER — Ambulatory Visit (INDEPENDENT_AMBULATORY_CARE_PROVIDER_SITE_OTHER): Payer: Medicare Other | Admitting: Cardiology

## 2011-07-16 VITALS — BP 128/68 | HR 64 | Ht 66.0 in | Wt 235.0 lb

## 2011-07-16 DIAGNOSIS — E782 Mixed hyperlipidemia: Secondary | ICD-10-CM

## 2011-07-16 DIAGNOSIS — I2510 Atherosclerotic heart disease of native coronary artery without angina pectoris: Secondary | ICD-10-CM

## 2011-07-16 DIAGNOSIS — F17200 Nicotine dependence, unspecified, uncomplicated: Secondary | ICD-10-CM

## 2011-07-16 DIAGNOSIS — I6529 Occlusion and stenosis of unspecified carotid artery: Secondary | ICD-10-CM

## 2011-07-16 NOTE — Assessment & Plan Note (Signed)
Symptomatically stable on medical therapy. ECG reviewed. Continue observation. We did discuss a basic walking regimen.

## 2011-07-16 NOTE — Assessment & Plan Note (Signed)
Lipids have been well controlled in general.

## 2011-07-16 NOTE — Assessment & Plan Note (Signed)
We continue to review smoking cessation.

## 2011-07-16 NOTE — Assessment & Plan Note (Signed)
Moderate bilateral disease, followed by Dr. Oneida Alar.

## 2011-07-16 NOTE — Progress Notes (Signed)
Clinical Summary Shelley [name] is a 70 y.o.female presenting for followup. She was seen in May.  Recent lab work shows normal AST and ALT, triglycerides 71, cholesterol 125, HDL 58, LDL 53. We reviewed these numbers today.  She just recently had followup for her carotid artery disease with vascular surgery, has 60-79% bilateral ICA stenosis.  She reports compliance with her medications. Still smoking cigarettes, but would like to quit. She sites psychosocial stress as a major limitation. We discussed this some today.  We also addressed a regular walking regimen.   Allergies  Allergen Reactions  . Mercaptopurine     REACTION: pancreatitis  . Sulfa Antibiotics   . Penicillins Rash    REACTION: rash    Medication list reviewed.  Past Medical History  Diagnosis Date  . COPD (chronic obstructive pulmonary disease)   . Osteoarthritis   . Sinus polyp   . Mixed hyperlipidemia   . Regional enteritis of large intestine   . Pancreatitis   . Rectal bleeding   . Hepatic steatosis   . History of oral aphthous ulcers   . Essential hypertension, benign   . TIA (transient ischemic attack)   . Hx of adenomatous colonic polyps   . Carotid artery disease     09-64% RICA and LICA 3/83 - Dr. Oneida Alar  . Coronary atherosclerosis of native coronary artery     Managed medically  . Myocardial infarction     IMI 10/04  . History of Salmonella gastroenteritis     Past Surgical History  Procedure Date  . Right total knee arthroplasty 2/12  . Melanoma resection 1996  . Cataract extraction     left eye    Family History  Problem Relation Age of Onset  . Diabetes type II Mother   . Heart attack Mother 73    questionable  . Cancer Mother     uterine  . Diabetes Mother   . Heart disease Mother   . Colon cancer Neg Hx   . Other Brother     PVD and hx DVT    Social History Ms. Stalker reports that she has been smoking Cigarettes.  She has a 20 pack-year smoking history. She has never used  smokeless tobacco. Ms. [place] reports that she does not drink alcohol.  Review of Systems As outlined above, otherwise negative.  Physical Examination Filed Vitals:   07/16/11 1522  BP: 128/68  Pulse: 64    Comfortable in no acute distress.  HEENT: Conjuctivae and lids normal, oropharynx clear with moist mucosa.  Neck: Supple, no elevated JVP, soft right carotid bruit, no thyromegaly or tenderness.  Lungs: Nonlabored breathing at rest. CTA without rales or wheezes.  Cor: PMI nondisplaced. RRR, normal S1/S2. No pathologic systolic murmurs. No S3 or rub.  Ext: No CCE. Distal pulses 2+.  Skin: Warm and dry.  Musculoskeletal: No gross deformities.  Neuropsychiatric: Alert and oriented x3, affect appropriate.   ECG Sinus rhythm at 60, evidence of prior inferior infarct.  Studies Lexiscan Cardiolite 06/07/2010:  No diagnostic ST segment changes. LVEF 50% with global hypokinesis. Fixed mid to basal inferior defect consistent with scar, no frank ischemia.   Problem List and Plan

## 2011-07-16 NOTE — Patient Instructions (Signed)
Your physician wants you to follow-up in: 6 months. You will receive a reminder letter in the mail one-two months in advance. If you don't receive a letter, please call our office to schedule the follow-up appointment. Your physician recommends that you continue on your current medications as directed. Please refer to the Current Medication list given to you today. Your physician recommends that you go to the [facility] for a FASTING lipid profile and liver function labs. Do not eat or drink after midnight. Do in 6 months before next office visit.

## 2011-07-26 NOTE — Procedures (Unsigned)
CAROTID DUPLEX EXAM  INDICATION:  Follow up carotid disease.  HISTORY: Diabetes:  No. Cardiac:  No. Hypertension:  Yes. Smoking:  Yes. Previous Surgery:  No. CV History: Amaurosis Fugax No, Paresthesias No, Hemiparesis No.                                      RIGHT               LEFT Brachial systolic pressure:         163                 160 Brachial Doppler waveforms:         WNL                 WNL Vertebral direction of flow:        Antegrade           Antegrade DUPLEX VELOCITIES (cm/sec) CCA peak systolic                   84                  76 ECA peak systolic                   91                  96 ICA peak systolic                   297                 016 ICA end diastolic                   92                  70 PLAQUE MORPHOLOGY:                  Heterogenous/calcific                  Heterogenous/calcific PLAQUE AMOUNT:                      Moderate to severe  Moderate to severe PLAQUE LOCATION:                    CCA/ECA/ICA         CCA/ECA/ICA  IMPRESSION: 1. Stable 60% to 79% bilateral internal carotid artery stenosis, right     worse than left. 2. Bilateral vertebral arteries are within normal limits.     ___________________________________________ Jessy Oto Fields, MD  LT/MEDQ  D:  07/12/2011  T:  07/12/2011  Job:  [id number]

## 2011-08-28 ENCOUNTER — Telehealth: Payer: Self-pay | Admitting: Internal Medicine

## 2011-08-28 NOTE — Telephone Encounter (Signed)
Patient out of Lialda. I have put another sample bottle (#90 tablets) at the front desk for pick up. Patient advised.

## 2011-10-11 ENCOUNTER — Other Ambulatory Visit: Payer: Self-pay | Admitting: *Deleted

## 2011-10-11 MED ORDER — CLOPIDOGREL BISULFATE 75 MG PO TABS: 75.0000 mg | ORAL_TABLET | Freq: Every day | ORAL | Status: DC

## 2011-10-22 ENCOUNTER — Telehealth: Payer: Self-pay | Admitting: Internal Medicine

## 2011-10-22 ENCOUNTER — Other Ambulatory Visit: Payer: Self-pay | Admitting: *Deleted

## 2011-10-22 MED ORDER — NITROGLYCERIN 0.4 MG SL SUBL: 0.4000 mg | SUBLINGUAL_TABLET | SUBLINGUAL | Status: DC | PRN

## 2011-10-22 NOTE — Telephone Encounter (Signed)
Patient needs Lialda sample. I have put sample bottle at the front desk for her to pick up and have advised her of this as well.

## 2011-12-23 ENCOUNTER — Telehealth: Payer: Self-pay | Admitting: Internal Medicine

## 2011-12-23 ENCOUNTER — Other Ambulatory Visit: Payer: Self-pay | Admitting: *Deleted

## 2011-12-23 DIAGNOSIS — E782 Mixed hyperlipidemia: Secondary | ICD-10-CM

## 2011-12-23 DIAGNOSIS — Z79899 Other long term (current) drug therapy: Secondary | ICD-10-CM

## 2011-12-23 NOTE — Telephone Encounter (Signed)
I have placed 2 Lialda bottles at the front desk for patient to pick up. She verbalizes understanding.

## 2012-01-10 ENCOUNTER — Other Ambulatory Visit (INDEPENDENT_AMBULATORY_CARE_PROVIDER_SITE_OTHER): Payer: Medicare Other | Admitting: *Deleted

## 2012-01-10 DIAGNOSIS — I6529 Occlusion and stenosis of unspecified carotid artery: Secondary | ICD-10-CM

## 2012-01-14 ENCOUNTER — Other Ambulatory Visit: Payer: Self-pay | Admitting: Cardiology

## 2012-01-15 ENCOUNTER — Telehealth: Payer: Self-pay | Admitting: *Deleted

## 2012-01-15 NOTE — Telephone Encounter (Signed)
Patient informed. 

## 2012-01-15 NOTE — Telephone Encounter (Signed)
Message copied by Merlene Laughter on Wed Jan 15, 2012 10:44 AM ------      Message from: MCDOWELL, Aloha Gell      Created: Wed Jan 15, 2012  9:21 AM       Normal LFTs, LDL at goal at 75.

## 2012-01-15 NOTE — Telephone Encounter (Signed)
Left message for patient to call office.  

## 2012-01-15 NOTE — Telephone Encounter (Signed)
Message copied by Merlene Laughter on Wed Jan 15, 2012 11:04 AM ------      Message from: MCDOWELL, Aloha Gell      Created: Wed Jan 15, 2012  8:39 AM       Normal hemoglobin and platelets.

## 2012-01-17 ENCOUNTER — Other Ambulatory Visit: Payer: Self-pay | Admitting: *Deleted

## 2012-01-17 DIAGNOSIS — I6529 Occlusion and stenosis of unspecified carotid artery: Secondary | ICD-10-CM

## 2012-01-21 ENCOUNTER — Encounter: Payer: Self-pay | Admitting: Vascular Surgery

## 2012-01-21 NOTE — Procedures (Unsigned)
CAROTID DUPLEX EXAM  INDICATION:  Follow up carotid artery disease.  HISTORY: Diabetes:  No Cardiac:  Yes, two stents in 2004 Hypertension:  Yes Smoking:  Yes Previous Surgery:  No CV History:  Asymptomatic Amaurosis Fugax No, Paresthesias No, Hemiparesis No                                      RIGHT             LEFT Brachial systolic pressure:         150               140 Brachial Doppler waveforms:         Triphasic         Triphasic Vertebral direction of flow:        Antegrade         Antegrade DUPLEX VELOCITIES (cm/sec) CCA peak systolic                   143               032 ECA peak systolic                   118               122 ICA peak systolic                   292               482 ICA end diastolic                   70                47 PLAQUE MORPHOLOGY:                  Heterogeneous     Heterogeneous PLAQUE AMOUNT:                      Moderate to severe                  Moderate to severe PLAQUE LOCATION:                    CCA, ECA and ICA  CCA, ECA and ICA  IMPRESSION:  Stable bilateral 60% to 79% internal carotid artery stenosis.  Vertebral artery flow antegrade.      ___________________________________________ Jessy Oto. Fields, MD  SS/MEDQ  D:  01/10/2012  T:  01/10/2012  Job:  [id number]

## 2012-01-24 ENCOUNTER — Encounter: Payer: Self-pay | Admitting: Cardiology

## 2012-01-24 ENCOUNTER — Ambulatory Visit (INDEPENDENT_AMBULATORY_CARE_PROVIDER_SITE_OTHER): Payer: Medicare Other | Admitting: Cardiology

## 2012-01-24 VITALS — BP 101/56 | HR 54 | Ht 66.0 in | Wt 224.0 lb

## 2012-01-24 DIAGNOSIS — I2510 Atherosclerotic heart disease of native coronary artery without angina pectoris: Secondary | ICD-10-CM

## 2012-01-24 DIAGNOSIS — F17200 Nicotine dependence, unspecified, uncomplicated: Secondary | ICD-10-CM

## 2012-01-24 DIAGNOSIS — E782 Mixed hyperlipidemia: Secondary | ICD-10-CM

## 2012-01-24 DIAGNOSIS — I6529 Occlusion and stenosis of unspecified carotid artery: Secondary | ICD-10-CM

## 2012-01-24 NOTE — Assessment & Plan Note (Signed)
Moderate bilateral ICA disease, followed by Dr. Oneida Alar.

## 2012-01-24 NOTE — Assessment & Plan Note (Signed)
We continue to address smoking cessation, she has not been able to quit.

## 2012-01-24 NOTE — Assessment & Plan Note (Signed)
Stable angina on medical therapy. Continue observation, 6 month followup unless her symptoms progress. No changes to current regimen with good heart rate and blood pressure control.

## 2012-01-24 NOTE — Assessment & Plan Note (Addendum)
Lipid numbers reviewed, LDL and HDL are at goal.

## 2012-01-24 NOTE — Patient Instructions (Signed)
Continue all current medications. Fasting lipid and liver panel prior to next office visit. Your physician wants you to follow up in: 6 months.  You will receive a reminder letter in the mail one-two months in advance.  If you don't receive a letter, please call our office to schedule the follow up appointment

## 2012-01-24 NOTE — Progress Notes (Signed)
Clinical Summary Shelley [name] is a 71 y.o.female presenting for followup. She was seen in November 2012.  Recent lab work reviewed showing normal LFTs, triglycerides 73, cholesterol 158, HDL 60, LDL 83. She reports compliance with Crestor. We discussed the lipid numbers today.  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 Some symptoms when she sweeps. These have been otherwise stable, no rest symptoms.  Lexiscan Cardiolite from 10/11 showed LVEF 50% with global hypokinesis, fixed inferior defect consistent with scar, no frank ischemia. We have been managing her medically.  She had a recent carotid Doppler with Dr. Oneida Alar showing stable 60-79% ICA stenoses bilaterally.   Allergies  Allergen Reactions  . Mercaptopurine     REACTION: pancreatitis  . Sulfa Antibiotics   . Penicillins Rash    REACTION: rash    Current Outpatient Prescriptions  Medication Sig Dispense Refill  . aspirin 81 MG tablet Take 81 mg by mouth daily.        . Calcium 600-200 MG-UNIT per tablet Take 1 tablet by mouth daily.        . Cholecalciferol (VITAMIN D3) 1000 UNITS CAPS Take 1 capsule by mouth daily.      . clopidogrel (PLAVIX) 75 MG tablet Take 1 tablet (75 mg total) by mouth daily.  30 tablet  5  . FLUoxetine (PROZAC) 40 MG capsule Take 40 mg by mouth daily.      Marland Kitchen glycopyrrolate (ROBINUL) 2 MG tablet Take 2 mg by mouth 2 (two) times daily as needed.        Marland Kitchen HYDROcodone-acetaminophen (LORCET) 10-650 MG per tablet Take 1 tablet by mouth every 12 (twelve) hours as needed.        . mesalamine (LIALDA) 1.2 G EC tablet Take 1 tablets by mouth twice daily      . metoprolol succinate (TOPROL-XL) 25 MG 24 hr tablet Take 1 tablet (25 mg total) by mouth daily.  30 tablet  6  . Misc Natural Products (OSTEO BI-FLEX ADV TRIPLE ST) TABS Take 1 tablet by mouth daily.        . nitroGLYCERIN (NITROSTAT) 0.4 MG SL tablet Place 1 tablet (0.4 mg total) under the tongue every 5 (five) minutes as  needed.  25 tablet  2  . Omega-3 Fatty Acids (FISH OIL) 1200 MG CAPS Take 1 capsule by mouth daily.        . rosuvastatin (CRESTOR) 10 MG tablet Take 10 mg by mouth daily.       Marland Kitchen DISCONTD: mesalamine (LIALDA) 1.2 G EC tablet Take 2 tablets by mouth once daily  90 tablet  0    Past Medical History  Diagnosis Date  . COPD (chronic obstructive pulmonary disease)   . Osteoarthritis   . Sinus polyp   . Mixed hyperlipidemia   . Regional enteritis of large intestine   . Pancreatitis   . Rectal bleeding   . Hepatic steatosis   . History of oral aphthous ulcers   . Essential hypertension, benign   . TIA (transient ischemic attack)   . Hx of adenomatous colonic polyps   . Carotid artery disease     22-63% RICA and LICA 3/35 - Dr. Oneida Alar  . Coronary atherosclerosis of native coronary artery     Managed medically  . Myocardial infarction     IMI 10/04  . History of Salmonella gastroenteritis     Social History Shelley [name] reports that she has been smoking Cigarettes.  She has a  20 pack-year smoking history. She has never used smokeless tobacco. Shelley [name] reports that she does not drink alcohol.  Review of Systems No palpitations or syncope. No reported bleeding problems. Continues to followup with Dr. [name] with GI. Otherwise negative.  Physical Examination Filed Vitals:   01/24/12 0903  BP: 101/56  Pulse: 54    Comfortable in no acute distress.  HEENT: Conjuctivae and lids normal, oropharynx clear with moist mucosa.  Neck: Supple, no elevated JVP, soft right carotid bruit, no thyromegaly or tenderness.  Lungs: Nonlabored breathing at rest. CTA without rales or wheezes.  Cor: PMI nondisplaced. RRR, normal S1/S2. No pathologic systolic murmurs. No S3 or rub.  Ext: No CCE. Distal pulses 2+.  Skin: Warm and dry.  Musculoskeletal: No gross deformities.  Neuropsychiatric: Alert and oriented x3, affect appropriate.   ECG Reviewed in EMR.  Problem List and Plan   CORONARY  ATHEROSCLEROSIS, NATIVE VESSEL Stable angina on medical therapy. Continue observation, 6 month followup unless her symptoms progress. No changes to current regimen with good heart rate and blood pressure control.  MIXED HYPERLIPIDEMIA Lipid numbers reviewed, LDL and HDL are at goal.  CAROTID ARTERY DISEASE Moderate bilateral ICA disease, followed by Dr. Oneida Alar.  TOBACCO ABUSE We continue to address smoking cessation, she has not been able to quit.     Satira Sark, M.D., F.A.C.C.

## 2012-04-10 ENCOUNTER — Other Ambulatory Visit: Payer: Self-pay | Admitting: Cardiology

## 2012-04-14 ENCOUNTER — Telehealth: Payer: Self-pay | Admitting: Internal Medicine

## 2012-04-14 DIAGNOSIS — K5010 Crohn's disease of large intestine without complications: Secondary | ICD-10-CM

## 2012-04-14 NOTE — Telephone Encounter (Signed)
Has been getting Lialda samples. She has a week's worth left. Wants to know if she should stay on Lialda or should she go back on Sulfasalzine.  She would like another bottle of samples if she is to stay on Lialda. Please, advise.

## 2012-04-14 NOTE — Telephone Encounter (Signed)
She can come pick up samples of Lialda, I have them in my office

## 2012-04-15 MED ORDER — MESALAMINE 1.2 G PO TBEC: DELAYED_RELEASE_TABLET | ORAL | Status: DC

## 2012-04-15 NOTE — Telephone Encounter (Signed)
One bottle sample up front for pick up. Left a message for patient that medication is up front for pick up.

## 2012-05-25 ENCOUNTER — Encounter: Payer: Self-pay | Admitting: *Deleted

## 2012-06-01 ENCOUNTER — Encounter: Payer: Self-pay | Admitting: Internal Medicine

## 2012-06-18 ENCOUNTER — Telehealth: Payer: Self-pay | Admitting: Internal Medicine

## 2012-06-18 DIAGNOSIS — K5010 Crohn's disease of large intestine without complications: Secondary | ICD-10-CM

## 2012-06-18 MED ORDER — MESALAMINE 1.2 G PO TBEC: DELAYED_RELEASE_TABLET | ORAL | Status: DC

## 2012-06-18 NOTE — Telephone Encounter (Signed)
Patient does need to continue Lialda. I have put a sample bottle at the front desk for her to pick up. She has been scheduled for a routine follow up visit.

## 2012-07-09 ENCOUNTER — Other Ambulatory Visit: Payer: Medicare Other

## 2012-07-09 ENCOUNTER — Ambulatory Visit: Payer: Medicare Other | Admitting: Neurosurgery

## 2012-07-29 ENCOUNTER — Encounter: Payer: Self-pay | Admitting: Neurosurgery

## 2012-07-30 ENCOUNTER — Ambulatory Visit (INDEPENDENT_AMBULATORY_CARE_PROVIDER_SITE_OTHER): Payer: Medicare Other | Admitting: Neurosurgery

## 2012-07-30 ENCOUNTER — Encounter: Payer: Self-pay | Admitting: Neurosurgery

## 2012-07-30 ENCOUNTER — Ambulatory Visit (INDEPENDENT_AMBULATORY_CARE_PROVIDER_SITE_OTHER): Payer: Medicare Other | Admitting: Vascular Surgery

## 2012-07-30 VITALS — BP 96/47 | HR 60 | Resp 16 | Ht 66.0 in | Wt 220.0 lb

## 2012-07-30 DIAGNOSIS — I6529 Occlusion and stenosis of unspecified carotid artery: Secondary | ICD-10-CM

## 2012-07-30 NOTE — Progress Notes (Signed)
[facility] Carotid Office Note  CC: Carotid surveillance Referring Physician: Fields  History of Present Illness: 71 year old female patient of Dr. Oneida Alar with no history of carotid intervention. The patient does have a history of MI and CVA in the past. The patient currently denies any signs or symptoms of CVA, TIA, amaurosis fugax or any neural deficit.  Past Medical History  Diagnosis Date  . COPD (chronic obstructive pulmonary disease)   . Osteoarthritis   . Sinus polyp   . Mixed hyperlipidemia   . Regional enteritis of large intestine   . Pancreatitis   . Rectal bleeding   . Hepatic steatosis   . History of oral aphthous ulcers   . Essential hypertension, benign   . TIA (transient ischemic attack)   . Hx of adenomatous colonic polyps   . Carotid artery disease     22-02% RICA and LICA 5/42 - Dr. Oneida Alar  . Coronary atherosclerosis of native coronary artery     Managed medically  . Myocardial infarction     IMI 10/04  . History of Salmonella gastroenteritis   . Cancer     Melanoma    ROS: _0  Positive   _1  Denies    General: _2  Weight loss, _3  Fever, _4  chills Neurologic: _5  Dizziness, _6  Blackouts, _7  Seizure _8  Stroke, _9  "Mini stroke", _10  Slurred speech, _11  Temporary blindness; _12  weakness in arms or legs, _13  Hoarseness Cardiac: _14  Chest pain/pressure, _15  Shortness of breath at rest _16  Shortness of breath with exertion, _17  Atrial fibrillation or irregular heartbeat Vascular: _18  Pain in legs with walking, _19  Pain in legs at rest, _20  Pain in legs at night,  _21  Non-healing ulcer, _22  Blood clot in vein/DVT,   Pulmonary: _23  Home oxygen, _24  Productive cough, _25  Coughing up blood, _26  Asthma,  _27  Wheezing Musculoskeletal:  _28  Arthritis, _29  Low back pain, _30  Joint pain Hematologic: _31  Easy Bruising, _32  Anemia; _33  Hepatitis Gastrointestinal: _34  Blood in stool, _35  Gastroesophageal Reflux/heartburn, _36  Trouble  swallowing Urinary: _37  chronic Kidney disease, _38  on HD - _39  MWF or _40  TTHS, _41  Burning with urination, _42  Difficulty urinating Skin: _43  Rashes, _44  Wounds Psychological: _45  Anxiety, _46  Depression   Social History History  Substance Use Topics  . Smoking status: Current Every Day Smoker -- 0.5 packs/day for 40 years    Types: Cigarettes  . Smokeless tobacco: Never Used  . Alcohol Use: No    Family History Family History  Problem Relation Age of Onset  . Diabetes type II Mother   . Heart attack Mother 39    questionable  . Uterine cancer Mother   . Diabetes Mother   . Heart disease Mother   . Colon cancer Neg Hx   . Other Brother     PVD and hx DVT    Allergies  Allergen Reactions  . Mercaptopurine     REACTION: pancreatitis  . Sulfa Antibiotics   . Penicillins Rash    REACTION: rash    Current Outpatient Prescriptions  Medication Sig Dispense Refill  . aspirin 81 MG tablet Take 81 mg by mouth daily.        . Calcium 600-200 MG-UNIT per tablet Take 1 tablet by mouth daily.        Marland Kitchen  Cholecalciferol (VITAMIN D3) 1000 UNITS CAPS Take 1 capsule by mouth daily.      . clopidogrel (PLAVIX) 75 MG tablet TAKE 1 TABLET BY MOUTH EVERY DAY  30 tablet  6  . FLUoxetine (PROZAC) 40 MG capsule Take 40 mg by mouth daily.      Marland Kitchen glycopyrrolate (ROBINUL) 2 MG tablet Take 2 mg by mouth 2 (two) times daily as needed.        Marland Kitchen HYDROcodone-acetaminophen (LORCET) 10-650 MG per tablet Take 1 tablet by mouth every 12 (twelve) hours as needed.        . mesalamine (LIALDA) 1.2 G EC tablet Take 1 tablets by mouth twice daily Lot [id number]    Exp- 02/2014 # 90 tabs  [id number] EXP 09/2014 #48 TABS  138 tablet  0  . metoprolol succinate (TOPROL-XL) 25 MG 24 hr tablet TAKE 1 TABLET BY MOUTH DAILY. EMERGENCY REFILL FAXED DR  30 tablet  5  . nitroGLYCERIN (NITROSTAT) 0.4 MG SL tablet Place 1 tablet (0.4 mg total) under the tongue every 5 (five) minutes as needed.  25 tablet  2  . Omega-3  Fatty Acids (FISH OIL) 1200 MG CAPS Take 1 capsule by mouth daily.        . rosuvastatin (CRESTOR) 10 MG tablet Take 10 mg by mouth daily.       . Misc Natural Products (OSTEO BI-FLEX ADV TRIPLE ST) TABS Take 1 tablet by mouth daily.          Physical Examination  Filed Vitals:   07/30/12 1515  BP: 96/47  Pulse: 60  Resp:     Body mass index is 35.51 kg/(m^2).  General:  WDWN in NAD Gait: Normal HEENT: WNL Eyes: Pupils equal Pulmonary: normal non-labored breathing , without Rales, rhonchi,  wheezing Cardiac: RRR, without  Murmurs, rubs or gallops; Abdomen: soft, NT, no masses Skin: no rashes, ulcers noted  Vascular Exam Pulses: 3+ radial pulses Carotid bruits: Mild carotid bruits heard bilaterally Extremities without ischemic changes, no Gangrene , no cellulitis; no open wounds;  Musculoskeletal: no muscle wasting or atrophy   Neurologic: A&O X 3; Appropriate Affect ; SENSATION: normal; MOTOR FUNCTION:  moving all extremities equally. Speech is fluent/normal  Non-Invasive Vascular Imaging CAROTID DUPLEX 07/30/2012  Right ICA 60 - 79 % stenosis Left ICA 60 - 79 % stenosis   ASSESSMENT/PLAN: Asymptomatic patient with unchanged carotid duplex, still in the 60- 79% range bilaterally. The patient will followup in 6 months with repeat carotid duplex, her questions were encouraged and answered, she is in agreement with this plan.  Beatris Ship ANP   Clinic MD: Oneida Alar

## 2012-07-30 NOTE — Progress Notes (Signed)
Carotid duplex performed @ [facility] 07/30/2012

## 2012-07-31 NOTE — Addendum Note (Signed)
Addended by: Mena Goes on: 07/31/2012 08:15 AM   Modules accepted: Orders

## 2012-08-11 ENCOUNTER — Other Ambulatory Visit: Payer: Self-pay | Admitting: *Deleted

## 2012-08-11 ENCOUNTER — Other Ambulatory Visit (INDEPENDENT_AMBULATORY_CARE_PROVIDER_SITE_OTHER): Payer: Medicare Other

## 2012-08-11 ENCOUNTER — Telehealth: Payer: Self-pay | Admitting: *Deleted

## 2012-08-11 ENCOUNTER — Encounter: Payer: Self-pay | Admitting: Internal Medicine

## 2012-08-11 ENCOUNTER — Ambulatory Visit (INDEPENDENT_AMBULATORY_CARE_PROVIDER_SITE_OTHER): Payer: Medicare Other | Admitting: Internal Medicine

## 2012-08-11 VITALS — BP 120/64 | HR 60 | Ht 66.0 in | Wt 219.0 lb

## 2012-08-11 DIAGNOSIS — K5090 Crohn's disease, unspecified, without complications: Secondary | ICD-10-CM

## 2012-08-11 DIAGNOSIS — K5010 Crohn's disease of large intestine without complications: Secondary | ICD-10-CM

## 2012-08-11 DIAGNOSIS — D689 Coagulation defect, unspecified: Secondary | ICD-10-CM

## 2012-08-11 DIAGNOSIS — E538 Deficiency of other specified B group vitamins: Secondary | ICD-10-CM

## 2012-08-11 LAB — CBC WITH DIFFERENTIAL/PLATELET
Basophils Absolute: 0.1 10*3/uL (ref 0.0–0.1)
Basophils Relative: 1 % (ref 0.0–3.0)
Eosinophils Absolute: 0.2 10*3/uL (ref 0.0–0.7)
Eosinophils Relative: 3.3 % (ref 0.0–5.0)
HCT: 39.9 % (ref 36.0–46.0)
Hemoglobin: 13.3 g/dL (ref 12.0–15.0)
Lymphocytes Relative: 23.1 % (ref 12.0–46.0)
Lymphs Abs: 1.7 10*3/uL (ref 0.7–4.0)
MCHC: 33.3 g/dL (ref 30.0–36.0)
MCV: 89.1 fl (ref 78.0–100.0)
Monocytes Absolute: 0.6 10*3/uL (ref 0.1–1.0)
Monocytes Relative: 8.1 % (ref 3.0–12.0)
Neutro Abs: 4.8 10*3/uL (ref 1.4–7.7)
Neutrophils Relative %: 64.5 % (ref 43.0–77.0)
Platelets: 247 10*3/uL (ref 150.0–400.0)
RBC: 4.48 Mil/uL (ref 3.87–5.11)
RDW: 15.2 % — ABNORMAL HIGH (ref 11.5–14.6)
WBC: 7.4 10*3/uL (ref 4.5–10.5)

## 2012-08-11 LAB — SEDIMENTATION RATE: Sed Rate: 29 mm/hr — ABNORMAL HIGH (ref 0–22)

## 2012-08-11 LAB — IBC PANEL
Iron: 65 ug/dL (ref 42–145)
Saturation Ratios: 16.1 % — ABNORMAL LOW (ref 20.0–50.0)
Transferrin: 289.2 mg/dL (ref 212.0–360.0)

## 2012-08-11 MED ORDER — MOVIPREP 100 G PO SOLR: 1.0000 | Freq: Once | ORAL | Status: DC

## 2012-08-11 MED ORDER — GLYCOPYRROLATE 2 MG PO TABS: 2.0000 mg | ORAL_TABLET | Freq: Two times a day (BID) | ORAL | Status: DC | PRN

## 2012-08-11 NOTE — Telephone Encounter (Signed)
Spoke with patient and scheduled 08/13/12 at 9:00 AM for first B 12 injection. Patient will start iron supplements.

## 2012-08-11 NOTE — Progress Notes (Signed)
Shelley [name] 1940/11/27 MRN [id number]   History of Present Illness:  This is a 71 year old white female with Crohn's colitis since 1998. Her last colonoscopy in November 2008 showed a tubulovillous adenoma and focal active colitis. Biopsies showed erosions of surface epithelium with neutrophilic inflammation and crypt distortion. She has been on Lialda 1.2 g twice a day with complete control of her colitis. She denies abdominal pain, diarrhea or rectal bleeding. She is allergic to 6 MP which caused her to have pancreatitis. She is followed by Dr. Domenic Polite for coronary artery disease and she has been on Plavix. She has hepatic steatosis and carotid artery disease followed by Dr. Oneida Alar. She had an episode of diarrhea yesterday after drinking blueberry moonshine.   Past Medical History  Diagnosis Date  . COPD (chronic obstructive pulmonary disease)   . Osteoarthritis   . Sinus polyp   . Mixed hyperlipidemia   . Regional enteritis of large intestine   . Pancreatitis   . Rectal bleeding   . Hepatic steatosis   . History of oral aphthous ulcers   . Essential hypertension, benign   . TIA (transient ischemic attack)   . Hx of adenomatous colonic polyps   . Carotid artery disease     29-51% RICA and LICA 8/84 - Dr. Oneida Alar  . Coronary atherosclerosis of native coronary artery     Managed medically  . Myocardial infarction     IMI 10/04  . History of Salmonella gastroenteritis   . Cancer     Melanoma   Past Surgical History  Procedure Date  . Right total knee arthroplasty 2/12  . Melanoma resection 1996  . Cataract extraction     left eye  . Eye surgery   . Joint replacement Feb 2012    Right Knee    reports that she has been smoking Cigarettes.  She has a 20 pack-year smoking history. She has never used smokeless tobacco. She reports that she does not drink alcohol or use illicit drugs. family history includes Breast cancer in her sister; Diabetes in her mother; Diabetes type  II in her mother; Heart attack (age of onset:64) in her mother; Heart disease in her mother; Other in her brother; and Uterine cancer in her mother.  There is no history of Colon cancer. Allergies  Allergen Reactions  . Mercaptopurine     REACTION: pancreatitis  . Sulfa Antibiotics   . Penicillins Rash    REACTION: rash        Review of Systems: Negative for heartburn dysphagia weight loss  The remainder of the 10 point ROS is negative except as outlined in H&P   Physical Exam: General appearance  Well developed, in no distress. Eyes- non icteric. HEENT nontraumatic, normocephalic. Mouth no lesions, tongue papillated, no cheilosis. Neck supple without adenopathy, thyroid not enlarged, no carotid bruits, no JVD. Lungs Clear to auscultation bilaterally. Cor normal S1, normal S2, regular rhythm, no murmur,  quiet precordium. Abdomen: Soft obese nontender. Normoactive bowel sounds. No distention. Rectal: Not done. Extremities no pedal edema. Skin no lesions. Neurological alert and oriented x 3. Psychological normal mood and affect.  Assessment and Plan:  Problem #1 Crohn's colitis since 1998. Patient is currently in remission on mesalamine 2.4 g daily. Se is due for a colonoscopy because of her personal history of a tubulovillous adenoma in 2008. We will schedule a colonoscopy at this time. We will also check a CBC, B12 and iron levels today. I have given her a sample bottle  of Lialda. She will continue to take Plavix for the procedure.   08/11/2012 Delfin Edis

## 2012-08-11 NOTE — Addendum Note (Signed)
Addended by: Larina Bras on: 08/11/2012 01:51 PM   Modules accepted: Orders

## 2012-08-11 NOTE — Telephone Encounter (Signed)
-----  Message -----   From: Lafayette Dragon, MD   Sent: 08/11/2012 1:52 PM   To: Larina Bras, CMA      Please call pt with low iron and B 12, normal Hgb. Please start B 12 weekly x4, give 1000 ug im, then monthly, repeat B 12 level in 6 months.Iron supplements may be OTC preparation 1 x /day.      Per family, call patient at cell number [phone]. Tried to reach patient but her mail box was not set up. Will try again later.

## 2012-08-11 NOTE — Patient Instructions (Addendum)
You have been given a separate informational sheet regarding your tobacco use, the importance of quitting and local resources to help you quit.  You have been scheduled for a colonoscopy with propofol. Please follow written instructions given to you at your visit today.  Please pick up your prep kit at the pharmacy within the next 1-3 days. If you use inhalers (even only as needed) or a CPAP machine, please bring them with you on the day of your procedure.  Your physician has requested that you go to the basement for the following lab work before leaving today: CBC, IBC, B12, Sed Rate  CC: Dr Matthias Hughs

## 2012-08-12 ENCOUNTER — Encounter: Payer: Medicare Other | Admitting: Internal Medicine

## 2012-08-13 ENCOUNTER — Telehealth: Payer: Self-pay | Admitting: Internal Medicine

## 2012-08-13 ENCOUNTER — Ambulatory Visit (INDEPENDENT_AMBULATORY_CARE_PROVIDER_SITE_OTHER): Payer: Medicare Other | Admitting: Internal Medicine

## 2012-08-13 DIAGNOSIS — E538 Deficiency of other specified B group vitamins: Secondary | ICD-10-CM

## 2012-08-13 MED ORDER — CYANOCOBALAMIN 1000 MCG/ML IJ SOLN
1000.0000 ug | Freq: Once | INTRAMUSCULAR | Status: AC
  Administered 2012-08-13: 1000 ug via INTRAMUSCULAR

## 2012-08-13 NOTE — Telephone Encounter (Signed)
error

## 2012-08-26 DIAGNOSIS — D126 Benign neoplasm of colon, unspecified: Secondary | ICD-10-CM

## 2012-08-26 HISTORY — DX: Benign neoplasm of colon, unspecified: D12.6

## 2012-09-22 ENCOUNTER — Ambulatory Visit (AMBULATORY_SURGERY_CENTER): Payer: Medicare Other | Admitting: Internal Medicine

## 2012-09-22 ENCOUNTER — Encounter: Payer: Self-pay | Admitting: Internal Medicine

## 2012-09-22 VITALS — BP 165/71 | HR 56 | Temp 96.2°F | Resp 19 | Ht 66.0 in | Wt 220.0 lb

## 2012-09-22 DIAGNOSIS — D126 Benign neoplasm of colon, unspecified: Secondary | ICD-10-CM

## 2012-09-22 DIAGNOSIS — K5090 Crohn's disease, unspecified, without complications: Secondary | ICD-10-CM

## 2012-09-22 MED ORDER — SODIUM CHLORIDE 0.9 % IV SOLN: 500.0000 mL | INTRAVENOUS | Status: DC

## 2012-09-22 NOTE — Progress Notes (Signed)
Lidocaine-40mg IV prior to Propofol InductionPropofol given over incremental dosages 

## 2012-09-22 NOTE — Patient Instructions (Addendum)
YOU HAD AN ENDOSCOPIC PROCEDURE TODAY AT THE [facility]: Refer to the procedure report that was given to you for any specific questions about what was found during the examination.  If the procedure report does not answer your questions, please call your gastroenterologist to clarify.  If you requested that your care partner not be given the details of your procedure findings, then the procedure report has been included in a sealed envelope for you to review at your convenience later.  YOU SHOULD EXPECT: Some feelings of bloating in the abdomen. Passage of more gas than usual.  Walking can help get rid of the air that was put into your GI tract during the procedure and reduce the bloating. If you had a lower endoscopy (such as a colonoscopy or flexible sigmoidoscopy) you may notice spotting of blood in your stool or on the toilet paper. If you underwent a bowel prep for your procedure, then you may not have a normal bowel movement for a few days.  DIET: Your first meal following the procedure should be a light meal and then it is ok to progress to your normal diet.  A half-sandwich or bowl of soup is an example of a good first meal.  Heavy or fried foods are harder to digest and may make you feel nauseous or bloated.  Likewise meals heavy in dairy and vegetables can cause extra gas to form and this can also increase the bloating.  Drink plenty of fluids but you should avoid alcoholic beverages for 24 hours.  ACTIVITY: Your care partner should take you home directly after the procedure.  You should plan to take it easy, moving slowly for the rest of the day.  You can resume normal activity the day after the procedure however you should NOT DRIVE or use heavy machinery for 24 hours (because of the sedation medicines used during the test).    SYMPTOMS TO REPORT IMMEDIATELY: A gastroenterologist can be reached at any hour.  During normal business hours, 8:30 AM to 5:00 PM Monday through Friday,  call [phone].  After hours and on weekends, please call the GI answering service at [phone] who will take a message and have the physician on call contact you.   Following lower endoscopy (colonoscopy or flexible sigmoidoscopy):  Excessive amounts of blood in the stool  Significant tenderness or worsening of abdominal pains  Swelling of the abdomen that is new, acute  Fever of 100F or higher  FOLLOW UP: If any biopsies were taken you will be contacted by phone or by letter within the next 1-3 weeks.  Call your gastroenterologist if you have not heard about the biopsies in 3 weeks.  Our staff will call the home number listed on your records the next business day following your procedure to check on you and address any questions or concerns that you may have at that time regarding the information given to you following your procedure. This is a courtesy call and so if there is no answer at the home number and we have not heard from you through the emergency physician on call, we will assume that you have returned to your regular daily activities without incident.  SIGNATURES/CONFIDENTIALITY: You and/or your care partner have signed paperwork which will be entered into your electronic medical record.  These signatures attest to the fact that that the information above on your After Visit Summary has been reviewed and is understood.  Full responsibility of the confidentiality of this   discharge information lies with you and/or your care-partner.   Thank-you for choosing [place] for your medical care.

## 2012-09-22 NOTE — Op Note (Signed)
[facility]  [address] [place], [address]   COLONOSCOPY PROCEDURE REPORT  PATIENT: Shelley [name], Shelley [name]  MR#: [id number] BIRTHDATE: 16-Feb-1941 , 60  yrs. old GENDER: Female ENDOSCOPIST: Lafayette Dragon, MD REFERRED BY:  Matthias Hughs, M.D. PROCEDURE DATE:  09/22/2012 PROCEDURE:   Colonoscopy, surveillance ASA CLASS:   Class III INDICATIONS:Crohn's disease since 1998, last colon 06/2007- tubovillous adenoma, active colitis, she is on Plavix MEDICATIONS: MAC sedation, administered by CRNA and propofol (Diprivan) 275m IV  DESCRIPTION OF PROCEDURE:   After the risks and benefits and of the procedure were explained, informed consent was obtained.  A digital rectal exam revealed no abnormalities of the rectum.    The endoscope was introduced through the anus and advanced to the cecum, which was identified by both the appendix and ileocecal valve .  The quality of the prep was good, using MoviPrep .  The instrument was then slowly withdrawn as the colon was fully examined.   Patches of erythema and superficial abrasions in the sigmoid colon, no aphthous ulcers, no pseudopolyps,  extent of the colitis 0-30 cm, rest of the colon appears normal, including ileo-cecal valve, unable to enter TI,  few scattered diverticuli          Retroflexed views revealed no abnormalities. 5 mm polyp at 20 cm  was removed with cold snare    The scope was then withdrawn from the patient and the procedure completed.Biopsies taken from the ascendind, descending and sigmoid colon  COMPLICATIONS: There were no complications. ENDOSCOPIC IMPRESSION:  Minimally active colitis sigmoid colon, c/w Crohn's colitis ( patchy distribution), s/p multiple biopsies scattered diverticuli 5 mm sessile polyp removed with cold snare from 20 cm   RECOMMENDATIONS: Await pathology results continue Lialda 4.8 gms/day continue Plavix   REPEAT EXAM: In 7 year(s)  for  Colonoscopy.  cc:  _______________________________ eSigned:Lafayette Dragon MD 09/22/2012 11:28 AM     PATIENT NAME:  Shelley [name], MajerMR#: [id number]

## 2012-09-22 NOTE — Progress Notes (Signed)
Called to room to assist during endoscopic procedure.  Patient ID and intended procedure confirmed with present staff. Received instructions for my participation in the procedure from the performing physician.

## 2012-09-22 NOTE — Progress Notes (Addendum)
Patient did not have preoperative order for IV antibiotic SSI prophylaxis. [phone])  Patient did not experience any of the following events: a burn prior to discharge; a fall within the facility; wrong site/side/patient/procedure/implant event; or a hospital transfer or hospital admission upon discharge from the facility. [phone])

## 2012-09-23 ENCOUNTER — Telehealth: Payer: Self-pay | Admitting: *Deleted

## 2012-09-23 NOTE — Telephone Encounter (Signed)
  Follow up Call-  Call back number 09/22/2012  Post procedure Call Back phone  # [phone]  Permission to leave phone message Yes     Patient questions:  Do you have a fever, pain , or abdominal swelling? no Pain Score  0 *  Have you tolerated food without any problems? yes  Have you been able to return to your normal activities? yes  Do you have any questions about your discharge instructions: Diet   no Medications  no Follow up visit  no  Do you have questions or concerns about your Care? no  Actions: * If pain score is 4 or above: No action needed, pain <4.

## 2012-09-28 ENCOUNTER — Encounter: Payer: Self-pay | Admitting: Internal Medicine

## 2012-09-30 ENCOUNTER — Other Ambulatory Visit: Payer: Self-pay | Admitting: Cardiology

## 2012-09-30 NOTE — Telephone Encounter (Signed)
[place] pt, please advise

## 2012-10-09 ENCOUNTER — Other Ambulatory Visit: Payer: Self-pay | Admitting: Cardiology

## 2012-10-12 NOTE — Telephone Encounter (Signed)
Appears to be past due for follow up

## 2012-10-14 ENCOUNTER — Telehealth: Payer: Self-pay | Admitting: Cardiology

## 2012-10-14 DIAGNOSIS — Z79899 Other long term (current) drug therapy: Secondary | ICD-10-CM

## 2012-10-14 NOTE — Telephone Encounter (Signed)
Called home number to let patient know that she needs fasting lab work just before her visit. Lab orders faxed to [facility] lab.

## 2012-10-14 NOTE — Telephone Encounter (Signed)
Is she suppose to have lab work done before next appointment.

## 2012-10-15 NOTE — Telephone Encounter (Signed)
Patient informed. 

## 2012-11-02 ENCOUNTER — Telehealth: Payer: Self-pay | Admitting: *Deleted

## 2012-11-02 NOTE — Telephone Encounter (Signed)
Notes Recorded by Laurine Blazer, LPN on 2/58/3462 at 1:94 PM Patient notified. Already has follow up scheduled for April with SM.

## 2012-11-02 NOTE — Telephone Encounter (Signed)
Message copied by Laurine Blazer on Mon Nov 02, 2012  5:17 PM ------      Message from: MCDOWELL, Aloha Gell      Created: Mon Nov 02, 2012  4:39 PM       Reviewed. Normal LFTs and LDL 68. Continue same. ------

## 2012-11-11 ENCOUNTER — Telehealth: Payer: Self-pay | Admitting: Internal Medicine

## 2012-11-11 MED ORDER — SULFASALAZINE 500 MG PO TABS: 1500.0000 mg | ORAL_TABLET | Freq: Two times a day (BID) | ORAL | Status: DC

## 2012-11-11 NOTE — Telephone Encounter (Signed)
We do not have samples of Lialda available to give to patient (we have been providing samples for about 1 year for her). I have spoken to Dr [name]. She would like patient to go back on sulfasalazine 3 grams daily. I have advised patient of this and she verbalizes understanding.

## 2012-12-04 ENCOUNTER — Ambulatory Visit: Payer: Medicare Other | Admitting: Cardiology

## 2012-12-07 ENCOUNTER — Other Ambulatory Visit: Payer: Self-pay | Admitting: Cardiology

## 2012-12-24 ENCOUNTER — Ambulatory Visit (INDEPENDENT_AMBULATORY_CARE_PROVIDER_SITE_OTHER): Payer: Medicare Other | Admitting: Cardiology

## 2012-12-24 ENCOUNTER — Encounter: Payer: Self-pay | Admitting: Cardiology

## 2012-12-24 VITALS — BP 115/57 | HR 56 | Ht 66.0 in | Wt 224.0 lb

## 2012-12-24 DIAGNOSIS — F17200 Nicotine dependence, unspecified, uncomplicated: Secondary | ICD-10-CM

## 2012-12-24 DIAGNOSIS — I2510 Atherosclerotic heart disease of native coronary artery without angina pectoris: Secondary | ICD-10-CM

## 2012-12-24 DIAGNOSIS — E782 Mixed hyperlipidemia: Secondary | ICD-10-CM

## 2012-12-24 DIAGNOSIS — I6529 Occlusion and stenosis of unspecified carotid artery: Secondary | ICD-10-CM

## 2012-12-24 NOTE — Assessment & Plan Note (Signed)
She has not been able to quit smoking.

## 2012-12-24 NOTE — Patient Instructions (Addendum)

## 2012-12-24 NOTE — Assessment & Plan Note (Signed)
Well controlled on statin therapy. No changes made. Followup FLP and LFT for next visit.

## 2012-12-24 NOTE — Assessment & Plan Note (Signed)
Followed by Dr. Oneida Alar.

## 2012-12-24 NOTE — Progress Notes (Signed)
Clinical Summary Ms. Hemmelgarn is a 72 y.o.female last seen in May 2013. She reports no angina symptoms. Has been under a lot of stress, worries about her granddaughter who has cystic fibrosis and has been in the hospital. She reports compliance with her medications, no bleeding episodes, tolerating Crestor.  Recent lab work in March showed normal LFTs, triglycerides 89, cholesterol 154, HDL 68, LDL 68. We reviewed these today.   Allergies  Allergen Reactions  . Mercaptopurine     REACTION: pancreatitis  . Sulfa Antibiotics   . Penicillins Rash    REACTION: rash    Current Outpatient Prescriptions  Medication Sig Dispense Refill  . aspirin 81 MG tablet Take 81 mg by mouth daily.        . Calcium 600-200 MG-UNIT per tablet Take 1 tablet by mouth daily.        . Cholecalciferol (VITAMIN D3) 1000 UNITS CAPS Take 1 capsule by mouth daily.      . clopidogrel (PLAVIX) 75 MG tablet TAKE 1 TABLET BY MOUTH EVERY DAY  30 tablet  6  . CRESTOR 10 MG tablet TAKE ONE TABLET BY MOUTH AT BEDTIME  30 each  1  . FLUoxetine (PROZAC) 40 MG capsule Take 40 mg by mouth daily.      Marland Kitchen glycopyrrolate (ROBINUL) 2 MG tablet Take 1 tablet (2 mg total) by mouth 2 (two) times daily as needed.  60 tablet  2  . HYDROcodone-acetaminophen (LORCET) 10-650 MG per tablet Take 1 tablet by mouth every 12 (twelve) hours as needed.        . metoprolol succinate (TOPROL-XL) 25 MG 24 hr tablet TAKE 1 TABLET BY MOUTH DAILY  30 tablet  2  . nitroGLYCERIN (NITROSTAT) 0.4 MG SL tablet Place 1 tablet (0.4 mg total) under the tongue every 5 (five) minutes as needed.  25 tablet  2  . Omega-3 Fatty Acids (FISH OIL) 1200 MG CAPS Take 1 capsule by mouth daily.        Marland Kitchen sulfaSALAzine (AZULFIDINE) 500 MG tablet Take 3 tablets (1,500 mg total) by mouth 2 (two) times daily.  180 tablet  2   No current facility-administered medications for this visit.    Past Medical History  Diagnosis Date  . COPD (chronic obstructive pulmonary disease)    . Osteoarthritis   . Sinus polyp   . Mixed hyperlipidemia   . Regional enteritis of large intestine   . Pancreatitis   . Rectal bleeding   . Hepatic steatosis   . History of oral aphthous ulcers   . Essential hypertension, benign   . TIA (transient ischemic attack)   . Hx of adenomatous colonic polyps   . Carotid artery disease     42-59% RICA and LICA 5/63 - Dr. Oneida Alar  . Coronary atherosclerosis of native coronary artery     Managed medically  . Myocardial infarction     IMI 10/04  . History of Salmonella gastroenteritis   . Cancer     Melanoma  . Anxiety   . Depression   . Cataract   . Osteoporosis     Social History Ms. Dazey reports that she has been smoking Cigarettes.  She has a 10 pack-year smoking history. She has never used smokeless tobacco. Ms. Labarbera reports that she does not drink alcohol.  Review of Systems No palpitations or syncope. No claudication. Stable appetite. Otherwise negative.  Physical Examination Filed Vitals:   12/24/12 1045  BP: 115/57  Pulse: 56   Filed  Weights   12/24/12 1045  Weight: 224 lb (101.606 kg)    Comfortable in no acute distress.  HEENT: Conjuctivae and lids normal, oropharynx clear with moist mucosa.  Neck: Supple, no elevated JVP, soft right carotid bruit, no thyromegaly or tenderness.  Lungs: Nonlabored breathing at rest. CTA without rales or wheezes.  Cor: PMI nondisplaced. RRR, normal S1/S2. No pathologic systolic murmurs. No S3 or rub.  Ext: No CCE. Distal pulses 2+.  Skin: Warm and dry.  Musculoskeletal: No gross deformities.  Neuropsychiatric: Alert and oriented x3, affect appropriate.   Problem List and Plan   CORONARY ATHEROSCLEROSIS, NATIVE VESSEL Continue medical therapy and observation. No active angina symptoms.  CAROTID ARTERY DISEASE Followed by Dr. Oneida Alar.  MIXED HYPERLIPIDEMIA Well controlled on statin therapy. No changes made. Followup FLP and LFT for next visit.  TOBACCO ABUSE She has  not been able to quit smoking.    Satira Sark, M.D., F.A.C.C.

## 2012-12-24 NOTE — Assessment & Plan Note (Signed)
Continue medical therapy and observation. No active angina symptoms.

## 2013-01-01 ENCOUNTER — Ambulatory Visit: Payer: Medicare Other | Admitting: Cardiology

## 2013-01-05 ENCOUNTER — Telehealth: Payer: Self-pay | Admitting: Internal Medicine

## 2013-01-05 NOTE — Telephone Encounter (Signed)
error

## 2013-01-28 ENCOUNTER — Other Ambulatory Visit (INDEPENDENT_AMBULATORY_CARE_PROVIDER_SITE_OTHER): Payer: Medicare Other | Admitting: *Deleted

## 2013-01-28 ENCOUNTER — Ambulatory Visit: Payer: Medicare Other | Admitting: Cardiology

## 2013-01-28 ENCOUNTER — Ambulatory Visit: Payer: Medicare Other | Admitting: Neurosurgery

## 2013-01-28 DIAGNOSIS — I6529 Occlusion and stenosis of unspecified carotid artery: Secondary | ICD-10-CM

## 2013-02-04 ENCOUNTER — Telehealth: Payer: Self-pay | Admitting: *Deleted

## 2013-02-04 ENCOUNTER — Other Ambulatory Visit (INDEPENDENT_AMBULATORY_CARE_PROVIDER_SITE_OTHER): Payer: Medicare Other

## 2013-02-04 ENCOUNTER — Other Ambulatory Visit: Payer: Self-pay | Admitting: *Deleted

## 2013-02-04 DIAGNOSIS — E538 Deficiency of other specified B group vitamins: Secondary | ICD-10-CM

## 2013-02-04 LAB — VITAMIN B12: Vitamin B-12: 338 pg/mL (ref 211–911)

## 2013-02-04 NOTE — Telephone Encounter (Signed)
Left a message for patient to call me. 

## 2013-02-04 NOTE — Telephone Encounter (Signed)
Message copied by Hulan Saas on Thu Feb 04, 2013 10:24 AM ------      Message from: Hulan Saas      Created: Tue Aug 11, 2012  4:24 PM       Call and remind patient needs b 12 level on 02/08/13 for DB ------

## 2013-02-04 NOTE — Telephone Encounter (Signed)
Patient came in for labs.

## 2013-02-05 ENCOUNTER — Encounter: Payer: Self-pay | Admitting: Vascular Surgery

## 2013-02-05 ENCOUNTER — Other Ambulatory Visit: Payer: Self-pay | Admitting: Cardiology

## 2013-02-05 ENCOUNTER — Other Ambulatory Visit: Payer: Self-pay | Admitting: *Deleted

## 2013-02-05 DIAGNOSIS — E538 Deficiency of other specified B group vitamins: Secondary | ICD-10-CM

## 2013-02-05 MED ORDER — CYANOCOBALAMIN 1000 MCG/ML IJ SOLN: 1000.0000 ug | INTRAMUSCULAR | Status: DC

## 2013-02-05 NOTE — Telephone Encounter (Signed)
Medication sent via escribe.  

## 2013-03-17 ENCOUNTER — Telehealth: Payer: Self-pay | Admitting: Internal Medicine

## 2013-03-17 MED ORDER — SULFASALAZINE 500 MG PO TABS: 1500.0000 mg | ORAL_TABLET | Freq: Two times a day (BID) | ORAL | Status: DC

## 2013-03-17 NOTE — Telephone Encounter (Signed)
rx sent

## 2013-04-13 ENCOUNTER — Telehealth: Payer: Self-pay | Admitting: *Deleted

## 2013-04-13 NOTE — Telephone Encounter (Signed)
Patient informed via message machine that she didn't have any outstanding request for lab work and if cardiologist request labs, she will be informed during her office visit.

## 2013-04-13 NOTE — Telephone Encounter (Signed)
Message copied by Merlene Laughter on Tue Apr 13, 2013 11:25 AM ------      Message from: Delfino Lovett T      Created: Mon Apr 12, 2013 11:21 AM       Has appt . Nov with Dr. Domenic Polite. States she needs bloodwork      Before visit. Please advise ------

## 2013-05-13 ENCOUNTER — Telehealth: Payer: Self-pay | Admitting: Cardiology

## 2013-05-13 ENCOUNTER — Other Ambulatory Visit: Payer: Self-pay | Admitting: Cardiology

## 2013-05-13 MED ORDER — ROSUVASTATIN CALCIUM 10 MG PO TABS: 10.0000 mg | ORAL_TABLET | Freq: Every day | ORAL | Status: DC

## 2013-05-13 NOTE — Telephone Encounter (Signed)
Needs refill on CRESTOR 10 MG tablet [[id number]]  [facility]. Patient has appointment with Dr. Domenic Polite in November 2014. She is out of Her medication.

## 2013-06-09 ENCOUNTER — Encounter: Payer: Self-pay | Admitting: Family

## 2013-06-10 ENCOUNTER — Ambulatory Visit (INDEPENDENT_AMBULATORY_CARE_PROVIDER_SITE_OTHER): Payer: Medicare Other | Admitting: Family

## 2013-06-10 ENCOUNTER — Other Ambulatory Visit: Payer: Medicare Other

## 2013-06-10 ENCOUNTER — Ambulatory Visit (HOSPITAL_COMMUNITY)
Admission: RE | Admit: 2013-06-10 | Discharge: 2013-06-10 | Disposition: A | Discharge status: Home / Self Care | Payer: Medicare Other | Source: Ambulatory Visit | Attending: Family | Admitting: Family

## 2013-06-10 ENCOUNTER — Encounter: Payer: Self-pay | Admitting: Family

## 2013-06-10 ENCOUNTER — Ambulatory Visit: Payer: Medicare Other | Admitting: Vascular Surgery

## 2013-06-10 DIAGNOSIS — I6529 Occlusion and stenosis of unspecified carotid artery: Secondary | ICD-10-CM | POA: Insufficient documentation

## 2013-06-10 NOTE — Patient Instructions (Addendum)
Stroke Prevention Some medical conditions and behaviors are associated with an increased chance of having a stroke. You may prevent a stroke by making healthy choices and managing medical conditions. Reduce your risk of having a stroke by:  Staying physically active. Get at least 30 minutes of activity on most or all days.  Not smoking. It may also be helpful to avoid exposure to secondhand smoke.  Limiting alcohol use. Moderate alcohol use is considered to be:  No more than 2 drinks per day for men.  No more than 1 drink per day for nonpregnant women.  Eating healthy foods.  Include 5 or more servings of fruits and vegetables a day.  Certain diets may be prescribed to address high blood pressure, high cholesterol, diabetes, or obesity.  Managing your cholesterol levels.  A low-saturated fat, low-trans fat, low-cholesterol, and high-fiber diet may control cholesterol levels.  Take any prescribed medicines to control cholesterol as directed by your caregiver.  Managing your diabetes.  A controlled-carbohydrate, controlled-sugar diet is recommended to manage diabetes.  Take any prescribed medicines to control diabetes as directed by your caregiver.  Controlling your high blood pressure (hypertension).  A low-salt (sodium), low-saturated fat, low-trans fat, and low-cholesterol diet is recommended to manage high blood pressure.  Take any prescribed medicines to control hypertension as directed by your caregiver.  Maintaining a healthy weight.  A reduced-calorie, low-sodium, low-saturated fat, low-trans fat, low-cholesterol diet is recommended to manage weight.  Stopping drug abuse.  Avoiding birth control pills.  Talk to your caregiver about the risks of taking birth control pills if you are over 30 years old, smoke, get migraines, or have ever had a blood clot.  Getting evaluated for sleep disorders (sleep apnea).  Talk to your caregiver about getting a sleep evaluation  if you snore a lot or have excessive sleepiness.  Taking medicines as directed by your caregiver.  For some people, aspirin or blood thinners (anticoagulants) are helpful in reducing the risk of forming abnormal blood clots that can lead to stroke. If you have the irregular heart rhythm of atrial fibrillation, you should be on a blood thinner unless there is a good reason you cannot take them.  Understand all your medicine instructions. SEEK IMMEDIATE MEDICAL CARE IF:   You have sudden weakness or numbness of the face, arm, or leg, especially on one side of the body.  You have sudden confusion.  You have trouble speaking (aphasia) or understanding.  You have sudden trouble seeing in one or both eyes.  You have sudden trouble walking.  You have dizziness.  You have a loss of balance or coordination.  You have a sudden, severe headache with no known cause.  You have new chest pain or an irregular heartbeat. Any of these symptoms may represent a serious problem that is an emergency. Do not wait to see if the symptoms will go away. Get medical help right away. Call your local emergency services (911 in [place] Do not drive yourself to the hospital. Document Released: 09/19/2004 Document Revised: 11/04/2011 Document Reviewed: 04/01/2011 [facility] Patient Information 2014 [place], [place].  Smoking Cessation Quitting smoking is important to your health and has many advantages. However, it is not always easy to quit since nicotine is a very addictive drug. Often times, people try 3 times or more before being able to quit. This document explains the best ways for you to prepare to quit smoking. Quitting takes hard work and a lot of effort, but you can do it.  ADVANTAGES OF QUITTING SMOKING You will live longer, feel better, and live better. Your body will feel the impact of quitting smoking almost immediately. Within 20 minutes, blood pressure decreases. Your pulse returns to its normal  level. After 8 hours, carbon monoxide levels in the blood return to normal. Your oxygen level increases. After 24 hours, the chance of having a heart attack starts to decrease. Your breath, hair, and body stop smelling like smoke. After 48 hours, damaged nerve endings begin to recover. Your sense of taste and smell improve. After 72 hours, the body is virtually free of nicotine. Your bronchial tubes relax and breathing becomes easier. After 2 to 12 weeks, lungs can hold more air. Exercise becomes easier and circulation improves. The risk of having a heart attack, stroke, cancer, or lung disease is greatly reduced. After 1 year, the risk of coronary heart disease is cut in half. After 5 years, the risk of stroke falls to the same as a nonsmoker. After 10 years, the risk of lung cancer is cut in half and the risk of other cancers decreases significantly. After 15 years, the risk of coronary heart disease drops, usually to the level of a nonsmoker. If you are pregnant, quitting smoking will improve your chances of having a healthy baby. The people you live with, especially any children, will be healthier. You will have extra money to spend on things other than cigarettes. QUESTIONS TO THINK ABOUT BEFORE ATTEMPTING TO QUIT You may want to talk about your answers with your caregiver. Why do you want to quit? If you tried to quit in the past, what helped and what did not? What will be the most difficult situations for you after you quit? How will you plan to handle them? Who can help you through the tough times? Your family? Friends? A caregiver? What pleasures do you get from smoking? What ways can you still get pleasure if you quit? Here are some questions to ask your caregiver: How can you help me to be successful at quitting? What medicine do you think would be best for me and how should I take it? What should I do if I need more help? What is smoking withdrawal like? How can I get information  on withdrawal? GET READY Set a quit date. Change your environment by getting rid of all cigarettes, ashtrays, matches, and lighters in your home, car, or work. Do not let people smoke in your home. Review your past attempts to quit. Think about what worked and what did not. GET SUPPORT AND ENCOURAGEMENT You have a better chance of being successful if you have help. You can get support in many ways. Tell your family, friends, and co-workers that you are going to quit and need their support. Ask them not to smoke around you. Get individual, group, or telephone counseling and support. Programs are available at [employer] and health centers. Call your local health department for information about programs in your area. Spiritual beliefs and practices may help some smokers quit. Download a "quit meter" on your computer to keep track of quit statistics, such as how long you have gone without smoking, cigarettes not smoked, and money saved. Get a self-help book about quitting smoking and staying off of tobacco. [place] yourself from urges to smoke. Talk to someone, go for a walk, or occupy your time with a task. Change your normal routine. Take a different route to work. Drink tea instead of coffee. Eat breakfast  in a different place. Reduce your stress. Take a hot bath, exercise, or read a book. Plan something enjoyable to do every day. Reward yourself for not smoking. Explore interactive web-based programs that specialize in helping you quit. GET MEDICINE AND USE IT CORRECTLY Medicines can help you stop smoking and decrease the urge to smoke. Combining medicine with the above behavioral methods and support can greatly increase your chances of successfully quitting smoking. Nicotine replacement therapy helps deliver nicotine to your body without the negative effects and risks of smoking. Nicotine replacement therapy includes nicotine gum, lozenges, inhalers, nasal  sprays, and skin patches. Some may be available over-the-counter and others require a prescription. Antidepressant medicine helps people abstain from smoking, but how this works is unknown. This medicine is available by prescription. Nicotinic receptor partial agonist medicine simulates the effect of nicotine in your brain. This medicine is available by prescription. Ask your caregiver for advice about which medicines to use and how to use them based on your health history. Your caregiver will tell you what side effects to look out for if you choose to be on a medicine or therapy. Carefully read the information on the package. Do not use any other product containing nicotine while using a nicotine replacement product.  RELAPSE OR DIFFICULT SITUATIONS Most relapses occur within the first 3 months after quitting. Do not be discouraged if you start smoking again. Remember, most people try several times before finally quitting. You may have symptoms of withdrawal because your body is used to nicotine. You may crave cigarettes, be irritable, feel very hungry, cough often, get headaches, or have difficulty concentrating. The withdrawal symptoms are only temporary. They are strongest when you first quit, but they will go away within 10 14 days. To reduce the chances of relapse, try to: Avoid drinking alcohol. Drinking lowers your chances of successfully quitting. Reduce the amount of caffeine you consume. Once you quit smoking, the amount of caffeine in your body increases and can give you symptoms, such as a rapid heartbeat, sweating, and anxiety. Avoid smokers because they can make you want to smoke. Do not let weight gain distract you. Many smokers will gain weight when they quit, usually less than 10 pounds. Eat a healthy diet and stay active. You can always lose the weight gained after you quit. Find ways to improve your mood other than smoking. FOR MORE INFORMATION  www.smokefree.gov  Document Released:  08/06/2001 Document Revised: 02/11/2012 Document Reviewed: 11/21/2011 [facility] Patient Information 2014 [place], [place].

## 2013-06-10 NOTE — Progress Notes (Signed)
Established Carotid Patient  Previous Carotid surgery: No  History of Present Illness  ELLEIGH CASSETTA is a 72 y.o. female patient of Dr. Oneida Alar with no history of carotid intervention. The patient does have a history of MI and 2 cardiac stents placed in 2004 and TIA symptoms many years ago manifested as transient right arm weakness, but denies history of stroke. She has had no further stroke or TIA symptoms.   The patient denies amaurosis fugax or monocular blindness.  The patient  denies facial drooping.  Pt. denies current hemiplegia.  The patient denies receptive or expressive aphasia.   Patient denies non-healing wounds.   Patient denies New Medical or Surgical History. She lives in a 3 story house and climbs stairs several times/day, but her ambulation is otherwise limited by arthritis and pain from lumbar spine problems: DDD, HNP, arthritis, left leg sciatica.   Pt Diabetic: No Pt smoker: smoker  (1/2 ppd x since high school, quit several times in these yrs)  Pt meds include: Statin : Yes Betablocker: Yes ASA: Yes Other anticoagulants/antiplatelets: Plavix   Past Medical History  Diagnosis Date  . COPD (chronic obstructive pulmonary disease)   . Osteoarthritis   . Sinus polyp   . Mixed hyperlipidemia   . Regional enteritis of large intestine   . Pancreatitis   . Rectal bleeding   . Hepatic steatosis   . History of oral aphthous ulcers   . Essential hypertension, benign   . TIA (transient ischemic attack)   . Hx of adenomatous colonic polyps   . Carotid artery disease     88-41% RICA and LICA 6/60 - Dr. Oneida Alar  . Coronary atherosclerosis of native coronary artery     Managed medically  . Myocardial infarction     IMI 10/04  . History of Salmonella gastroenteritis   . Cancer     Melanoma  . Anxiety   . Depression   . Cataract   . Osteoporosis     Social History History  Substance Use Topics  . Smoking status: Current Every Day Smoker -- 0.25 packs/day  for 40 years    Types: Cigarettes  . Smokeless tobacco: Never Used  . Alcohol Use: No    Family History Family History  Problem Relation Age of Onset  . Diabetes type II Mother   . Heart attack Mother 59    questionable  . Uterine cancer Mother   . Diabetes Mother   . Heart disease Mother   . Colon cancer Neg Hx   . Other Brother     PVD and hx DVT  . Breast cancer Sister     Surgical History Past Surgical History  Procedure Laterality Date  . Right total knee arthroplasty  2/12  . Melanoma resection  1996  . Cataract extraction      left eye  . Eye surgery    . Joint replacement  Feb 2012    Right Knee    Allergies  Allergen Reactions  . Mercaptopurine     REACTION: pancreatitis  . Sulfa Antibiotics   . Penicillins Rash    REACTION: rash    Current Outpatient Prescriptions  Medication Sig Dispense Refill  . aspirin 81 MG tablet Take 81 mg by mouth daily.        . Calcium 600-200 MG-UNIT per tablet Take 1 tablet by mouth daily.        . Cholecalciferol (VITAMIN D3) 1000 UNITS CAPS Take 1 capsule by mouth daily.      Marland Kitchen  clopidogrel (PLAVIX) 75 MG tablet TAKE 1 TABLET BY MOUTH EVERY DAY  30 tablet  6  . cyanocobalamin (,VITAMIN B-12,) 1000 MCG/ML injection Inject 1 mL (1,000 mcg total) into the muscle every 30 (thirty) days.  12 mL  0  . FLUoxetine (PROZAC) 40 MG capsule Take 40 mg by mouth daily.      Marland Kitchen glycopyrrolate (ROBINUL) 2 MG tablet Take 1 tablet (2 mg total) by mouth 2 (two) times daily as needed.  60 tablet  2  . HYDROcodone-acetaminophen (LORCET) 10-650 MG per tablet Take 1 tablet by mouth every 12 (twelve) hours as needed.        . loratadine (CLARITIN) 10 MG tablet Take 10 mg by mouth daily.      . metoprolol succinate (TOPROL-XL) 25 MG 24 hr tablet TAKE 1 TABLET BY MOUTH EVERY DAY  60 tablet  2  . nitroGLYCERIN (NITROSTAT) 0.4 MG SL tablet Place 1 tablet (0.4 mg total) under the tongue every 5 (five) minutes as needed.  25 tablet  2  . Omega-3 Fatty  Acids (FISH OIL) 1200 MG CAPS Take 1 capsule by mouth daily.        . rosuvastatin (CRESTOR) 10 MG tablet Take 1 tablet (10 mg total) by mouth daily.  30 tablet  1  . sulfaSALAzine (AZULFIDINE) 500 MG tablet Take 3 tablets (1,500 mg total) by mouth 2 (two) times daily.  180 tablet  2   No current facility-administered medications for this visit.    Review of Systems : _0  Positive   _1  Denies  General:_2  Weight loss,  _3  Weight gain, _4  Loss of appetite, _5  Fever, _6  chills  Neurologic: _7  Dizziness, _8  Blackouts, _9  Headaches, _10  Seizure _11  Stroke, _12  "Mini stroke", _13  Slurred speech, _14  Temporary blindness;  _15 weakness,  Ear/Nose/Throat: _16  Change in hearing, _17  Nose bleeds, _18  Hoarseness  Vascular:_19  Pain in legs with walking, _20  Pain in feet while lying flat , _21   Non-healing ulcer, _22  Blood clot in vein,    Pulmonary: _23  Home oxygen, _24   Productive cough, _25  Bronchitis, _26  Coughing up blood,  _27  Asthma, _28  Wheezing  Musculoskeletal:  _29  Arthritis, _30  Joint pain, _31  low back pain  Cardiac: _32  Chest pain, _33  Shortness of breath when lying flat, _34  Shortness of breath with exertion, _35  Palpitations, _36  Heart murmur, _37   Atrial fibrillation  Hematologic:_38  Easy Bruising, _39  Anemia; _40  Hepatitis  Psychiatric: _41   Depression, _42  Anxiety   Gastrointestinal: _43  Black stool, _44  Blood in stool, _45  Peptic ulcer disease,  _46  Gastroesophageal Reflux, _47  Trouble swallowing, _48  Diarrhea, _49  Constipation  Urinary: _50  chronic Kidney disease, _51  on HD, _52  Burning with urination, _53  Frequent urination, _54  Difficulty urinating;   Skin: _55  Rashes, _56  Wounds    Physical Examination  Filed Vitals:   06/10/13 1321  BP: 127/74  Pulse: 56  Resp:    Filed Weights   06/10/13 1318  Weight: 226 lb (102.513 kg)   Body mass index is 37.02 kg/(m^2).  General: WDWN female in NAD, obese. GAIT: uses cane, antalgic. Eyes: PERRLA Pulmonary:   CTAB, Negative  Rales, Negative rhonchi, & Negative wheezing.  Cardiac: regular Rhythm ,  Negative  Murmurs.  VASCULAR EXAM Carotid Bruits Left Right   Negative Negative    Aorta is not palpable. Radial pulses are 2+ palpable and equal.                                                                                                                            LE Pulses LEFT RIGHT       POPLITEAL  not palpable   not palpable       POSTERIOR TIBIAL  not palpable   not palpable        DORSALIS PEDIS      ANTERIOR TIBIAL  palpable   palpable     Gastrointestinal: soft, nontender, BS WNL, no r/g,  negative masses.  Musculoskeletal: Negative muscle atrophy/wasting. M/S 5/5 throughout except left lower extremity is 4/5, Extremities without ischemic changes.  Neurologic: A&O X 3; Appropriate Affect ; SENSATION ;normal;  Speech is normal CN 2-12 intact  except, Pain and light touch intact in extremities, Motor exam as listed above.   Non-Invasive Vascular Imaging CAROTID DUPLEX 06/10/2013   Right ICA: 60 - 79 % stenosis. Left ICA: 60 - 79 % stenosis.  These findings are Unchanged from previous exam.  Assessment: OMNI DUNSWORTH is a 72 y.o. female who presents with asymptomatic 60 - 79 % Bilateral ICA  Stenosis. The  ICA stenosis is  Unchanged from previous exam. Brachial pressures are equal. Her risk factors for atherosclerosis progression remain smoking and obesity.  Plan: Follow-up in 6 months with Carotid Duplex scan.   I discussed in depth with the patient the nature of atherosclerosis, and emphasized the importance of maximal medical management including strict control of blood pressure, blood glucose, and lipid levels, obtaining regular exercise, and cessation of smoking.  The patient is aware that without maximal medical management the underlying atherosclerotic disease process will progress, limiting the benefit of any interventions. The patient was given information  about stroke prevention and what symptoms should prompt the patient to seek immediate medical care. The patient was also counseled re smoking cessation and given printed material re this. Thank you for allowing [place] to participate in this patient's care.  Clemon Chambers, RN, MSN, FNP-C [facility] Office: [phone]  Clinic Physician: Oneida Alar  06/10/2013 1:21 PM

## 2013-06-16 ENCOUNTER — Other Ambulatory Visit: Payer: Self-pay | Admitting: Internal Medicine

## 2013-06-16 NOTE — Telephone Encounter (Signed)
rx sent. Patient needs office visit for further refills.

## 2013-06-30 ENCOUNTER — Ambulatory Visit (INDEPENDENT_AMBULATORY_CARE_PROVIDER_SITE_OTHER): Payer: Medicare Other | Admitting: Cardiology

## 2013-06-30 ENCOUNTER — Encounter: Payer: Self-pay | Admitting: Cardiology

## 2013-06-30 VITALS — BP 115/70 | HR 60 | Ht 65.0 in | Wt 224.8 lb

## 2013-06-30 DIAGNOSIS — I6529 Occlusion and stenosis of unspecified carotid artery: Secondary | ICD-10-CM

## 2013-06-30 DIAGNOSIS — I2510 Atherosclerotic heart disease of native coronary artery without angina pectoris: Secondary | ICD-10-CM

## 2013-06-30 DIAGNOSIS — F17200 Nicotine dependence, unspecified, uncomplicated: Secondary | ICD-10-CM

## 2013-06-30 DIAGNOSIS — Z79899 Other long term (current) drug therapy: Secondary | ICD-10-CM

## 2013-06-30 DIAGNOSIS — E782 Mixed hyperlipidemia: Secondary | ICD-10-CM

## 2013-06-30 NOTE — Assessment & Plan Note (Signed)
Due for followup FLP and LFT. These will be arranged. She continues on Crestor.

## 2013-06-30 NOTE — Progress Notes (Signed)
Clinical Summary Shelley [name] is a 72 y.o.female last seen in May of this year. She reports no progressive angina symptoms or unusual shortness of breath.  Interval visit with [facility] in October noted with asymptomatic 60-79% bilateral ICA stenoses. Lab work in September showed BUN 13, creatinine 0.7, potassium 4.3, hemoglobin A1c 5.5. She has not yet had followup liver or lipid testing.  Cardiolite from October 2011 demonstrated evidence of probable mid to basal inferior scar without ischemia, LVEF 50%. She has been managed medically.  ECG today shows sinus rhythm with low voltage, no significant ST segment changes.   Allergies  Allergen Reactions  . Mercaptopurine     REACTION: pancreatitis  . Sulfa Antibiotics   . Penicillins Rash    REACTION: rash    Current Outpatient Prescriptions  Medication Sig Dispense Refill  . aspirin 81 MG tablet Take 81 mg by mouth daily.        . Calcium 600-200 MG-UNIT per tablet Take 1 tablet by mouth daily.        . Cholecalciferol (VITAMIN D3) 1000 UNITS CAPS Take 1 capsule by mouth daily.      . clopidogrel (PLAVIX) 75 MG tablet TAKE 1 TABLET BY MOUTH EVERY DAY  30 tablet  6  . cyanocobalamin (,VITAMIN B-12,) 1000 MCG/ML injection Inject 1 mL (1,000 mcg total) into the muscle every 30 (thirty) days.  12 mL  0  . FLUoxetine (PROZAC) 40 MG capsule Take 40 mg by mouth daily.      Marland Kitchen glycopyrrolate (ROBINUL) 2 MG tablet Take 1 tablet (2 mg total) by mouth 2 (two) times daily as needed.  60 tablet  2  . HYDROcodone-acetaminophen (LORCET) 10-650 MG per tablet Take 1 tablet by mouth every 12 (twelve) hours as needed.        . loratadine (CLARITIN) 10 MG tablet Take 10 mg by mouth daily.      . metoprolol succinate (TOPROL-XL) 25 MG 24 hr tablet TAKE 1 TABLET BY MOUTH EVERY DAY  60 tablet  2  . nitroGLYCERIN (NITROSTAT) 0.4 MG SL tablet Place 1 tablet (0.4 mg total) under the tongue every 5 (five) minutes as needed.  25 tablet  2  . Omega-3 Fatty Acids (FISH  OIL) 1200 MG CAPS Take 1 capsule by mouth daily.        . rosuvastatin (CRESTOR) 10 MG tablet Take 1 tablet (10 mg total) by mouth daily.  30 tablet  1  . sulfaSALAzine (AZULFIDINE) 500 MG tablet TAKE 3 TABLETS BY MOUTH TWICE DAILY  180 tablet  0   No current facility-administered medications for this visit.    Past Medical History  Diagnosis Date  . COPD (chronic obstructive pulmonary disease)   . Osteoarthritis   . Sinus polyp   . Mixed hyperlipidemia   . Regional enteritis of large intestine   . Pancreatitis   . Rectal bleeding   . Hepatic steatosis   . History of oral aphthous ulcers   . Essential hypertension, benign   . TIA (transient ischemic attack)   . Hx of adenomatous colonic polyps   . Carotid artery disease     95-09% RICA and LICA 3/26 - Dr. Oneida Alar  . Coronary atherosclerosis of native coronary artery     Managed medically  . Myocardial infarction     IMI 10/04  . History of Salmonella gastroenteritis   . Cancer     Melanoma  . Anxiety   . Depression   . Cataract   .  Osteoporosis     Social History Shelley [name] reports that she has been smoking Cigarettes.  She has a 10 pack-year smoking history. She has never used smokeless tobacco. Shelley [name] reports that she does not drink alcohol.  Review of Systems No palpitations or dizziness. Stable appetite. No bleeding problems. No syncope. Otherwise negative.  Physical Examination Filed Vitals:   06/30/13 0839  BP: 115/70  Pulse: 60   Filed Weights   06/30/13 0839  Weight: 224 lb 12.8 oz (101.969 kg)    Comfortable in no acute distress.  HEENT: Conjuctivae and lids normal, oropharynx clear with moist mucosa.  Neck: Supple, no elevated JVP, soft right carotid bruit, no thyromegaly or tenderness.  Lungs: Nonlabored breathing at rest. CTA without rales or wheezes.  Cor: PMI nondisplaced. RRR, normal S1/S2. No pathologic systolic murmurs. No S3 or rub.  Ext: No CCE. Distal pulses 2+.  Skin: Warm and dry.    Musculoskeletal: No gross deformities.  Neuropsychiatric: Alert and oriented x3, affect appropriate.   Problem List and Plan   CORONARY ATHEROSCLEROSIS, NATIVE VESSEL Symptomatically stable on medical therapy. ECG reviewed. Continue observation.  Mixed hyperlipidemia Due for followup FLP and LFT. These will be arranged. She continues on Crestor.  CAROTID ARTERY DISEASE Bilateral carotid artery disease, followed by Dr. Oneida Alar. Recent followup Dopplers noted. She continues on aspirin, Plavix, and statin.  TOBACCO ABUSE Has not been able to stop smoking over the years despite some attempts.    Satira Sark, M.D., F.A.C.C.

## 2013-06-30 NOTE — Assessment & Plan Note (Signed)
Has not been able to stop smoking over the years despite some attempts.

## 2013-06-30 NOTE — Assessment & Plan Note (Signed)
Symptomatically stable on medical therapy. ECG reviewed. Continue observation.

## 2013-06-30 NOTE — Patient Instructions (Signed)
Your physician recommends that you schedule a follow-up appointment in: 6 months. You will receive a reminder letter in the mail in about 4 months reminding you to call and schedule your appointment. If you don't receive this letter, please contact our office. Your physician recommends that you continue on your current medications as directed. Please refer to the Current Medication list given to you today. Your physician recommends that you return for a FASTING lipid/liver profile: Please have this done at [facility]. Do not eat or drink at least 8 hours prior to having this done.

## 2013-06-30 NOTE — Assessment & Plan Note (Signed)
Bilateral carotid artery disease, followed by Dr. Oneida Alar. Recent followup Dopplers noted. She continues on aspirin, Plavix, and statin.

## 2013-07-02 ENCOUNTER — Other Ambulatory Visit: Payer: Self-pay | Admitting: Cardiology

## 2013-07-02 MED ORDER — CLOPIDOGREL BISULFATE 75 MG PO TABS: 75.0000 mg | ORAL_TABLET | Freq: Every day | ORAL | Status: DC

## 2013-07-09 ENCOUNTER — Telehealth: Payer: Self-pay | Admitting: *Deleted

## 2013-07-09 NOTE — Telephone Encounter (Signed)
Notes Recorded by Laurine Blazer, LPN on 63/81/7711 at 3:15 PM Patient notified and verbalized understanding.

## 2013-07-09 NOTE — Telephone Encounter (Signed)
Message copied by Laurine Blazer on Fri Jul 09, 2013  3:15 PM ------      Message from: MCDOWELL, Aloha Gell      Created: Fri Jul 09, 2013 12:12 PM       Reviewed. LFTs and hemoglobin are normal. Cholesterol looks good with LDL 60. Continue current regimen. ------

## 2013-07-15 ENCOUNTER — Telehealth: Payer: Self-pay | Admitting: Internal Medicine

## 2013-07-15 MED ORDER — SULFASALAZINE 500 MG PO TABS: ORAL_TABLET | ORAL | Status: DC

## 2013-07-15 NOTE — Telephone Encounter (Signed)
rx sent

## 2013-08-02 ENCOUNTER — Other Ambulatory Visit: Payer: Self-pay | Admitting: Cardiology

## 2013-08-05 ENCOUNTER — Telehealth: Payer: Self-pay | Admitting: *Deleted

## 2013-08-05 NOTE — Telephone Encounter (Signed)
Message copied by Hulan Saas on Thu Aug 05, 2013  9:07 AM ------      Message from: Hulan Saas      Created: Fri Feb 05, 2013  8:45 AM       Call and remind patient due for B12 on week of 08/09/13 for DB ------

## 2013-08-05 NOTE — Telephone Encounter (Signed)
Patient will have labs done on Tuesday with OV due to the distance she lives from our office.

## 2013-08-10 ENCOUNTER — Encounter: Payer: Self-pay | Admitting: Internal Medicine

## 2013-08-10 ENCOUNTER — Other Ambulatory Visit (INDEPENDENT_AMBULATORY_CARE_PROVIDER_SITE_OTHER): Payer: Medicare Other

## 2013-08-10 ENCOUNTER — Ambulatory Visit (INDEPENDENT_AMBULATORY_CARE_PROVIDER_SITE_OTHER): Payer: Medicare Other | Admitting: Internal Medicine

## 2013-08-10 VITALS — BP 128/60 | HR 64 | Ht 60.0 in | Wt 229.0 lb

## 2013-08-10 DIAGNOSIS — K5010 Crohn's disease of large intestine without complications: Secondary | ICD-10-CM

## 2013-08-10 DIAGNOSIS — D689 Coagulation defect, unspecified: Secondary | ICD-10-CM

## 2013-08-10 DIAGNOSIS — R160 Hepatomegaly, not elsewhere classified: Secondary | ICD-10-CM

## 2013-08-10 LAB — CBC WITH DIFFERENTIAL/PLATELET
Basophils Relative: 0.2 % (ref 0.0–3.0)
Eosinophils Relative: 3.1 % (ref 0.0–5.0)
HCT: 37.6 % (ref 36.0–46.0)
Lymphs Abs: 1.2 10*3/uL (ref 0.7–4.0)
MCV: 92.8 fl (ref 78.0–100.0)
Monocytes Absolute: 0.7 10*3/uL (ref 0.1–1.0)
Monocytes Relative: 9.3 % (ref 3.0–12.0)
Platelets: 235 10*3/uL (ref 150.0–400.0)
RBC: 4.05 Mil/uL (ref 3.87–5.11)
RDW: 14.3 % (ref 11.5–14.6)
WBC: 7.2 10*3/uL (ref 4.5–10.5)

## 2013-08-10 LAB — SEDIMENTATION RATE: Sed Rate: 33 mm/hr — ABNORMAL HIGH (ref 0–22)

## 2013-08-10 LAB — VITAMIN B12: Vitamin B-12: 1110 pg/mL — ABNORMAL HIGH (ref 211–911)

## 2013-08-10 MED ORDER — FOLIC ACID 1 MG PO TABS: 1.0000 mg | ORAL_TABLET | Freq: Every day | ORAL | Status: DC

## 2013-08-10 MED ORDER — SULFASALAZINE 500 MG PO TABS: ORAL_TABLET | ORAL | Status: DC

## 2013-08-10 NOTE — Patient Instructions (Addendum)
You have been given a separate informational sheet regarding your tobacco use, the importance of quitting and local resources to help you quit. We have sent the following medications to your pharmacy for you to pick up at your convenience: Sulfasalazine  Folic Acid  Your physician has requested that you go to the basement for the following lab work before leaving today: B12, CBC, Sed Rate  You have been scheduled for an abdominal ultrasound at [facility] (1st floor of hospital) on 08/16/13 at 8:00 am. Please arrive 15 minutes prior to your appointment for registration. Make certain not to have anything to eat or drink 6 hours prior to your appointment. Should you need to reschedule your appointment, please contact radiology at [phone]. This test typically takes about 30 minutes to perform.  Dr D.Tapper, Dr Johnny Bridge

## 2013-08-10 NOTE — Progress Notes (Signed)
Shelley [name] 09/26/1940 [id number]   History of Present Illness:  This is a 72 year old white female with Crohn's colitis since 1998. Her last colonoscopy in January 2014 showed active colitis in the left colon, diverticulosis and a tubular adenoma. Random biopsies, however, showed no evidence of colitis. She is followed by Dr. Domenic Polite for coronary artery disease. She has been on Plavix. Patient has allergies to 6-MP which caused pancreatitis. She is currently in remission. She denies rectal bleeding, abdominal pain or diarrhea. She has taken sulfasalazine 500 mg 6 tablets a day.  Past Medical History  Diagnosis Date  . COPD (chronic obstructive pulmonary disease)   . Osteoarthritis   . Sinus polyp   . Mixed hyperlipidemia   . Regional enteritis of large intestine   . Pancreatitis   . Rectal bleeding   . Hepatic steatosis   . History of oral aphthous ulcers   . Essential hypertension, benign   . TIA (transient ischemic attack)   . Hx of adenomatous colonic polyps   . Carotid artery disease     55-37% RICA and LICA 4/82 - Dr. Oneida Alar  . Coronary atherosclerosis of native coronary artery     Managed medically  . Myocardial infarction     IMI 10/04  . History of Salmonella gastroenteritis   . Cancer     Melanoma  . Anxiety   . Depression   . Cataract   . Osteoporosis   . Tubular adenoma of colon 08/2012    Past Surgical History  Procedure Laterality Date  . Right total knee arthroplasty  2/12  . Melanoma resection  1996  . Cataract extraction      left eye  . Eye surgery    . Joint replacement  Feb 2012    Right Knee    Allergies  Allergen Reactions  . Mercaptopurine     REACTION: pancreatitis  . Sulfa Antibiotics   . Penicillins Rash    REACTION: rash    Review of Systems: Denies heartburn, abdominal pain, diarrhea  The remainder of the 10 point ROS is negative except as outlined in the H&P  Physical Exam: General Appearance Well developed, in no  distress, overweight Eyes  Non icteric  HEENT  Non traumatic, normocephalic  Mouth No lesion, tongue papillated, no cheilosis Neck Supple without adenopathy, thyroid not enlarged, no carotid bruits, no JVD Lungs Clear to auscultation bilaterally COR Normal S1, normal S2, regular rhythm, no murmur, quiet precordium Abdomen Soft, nontender with normoactive bowel sounds, hepatomegaly. The left lobe of the liver crosses into left upper quadrant Rectal not done Extremities trace pedal edema Skin No lesions Neurological Alert and oriented x 3 Psychological Normal mood and affect  Assessment and Plan:   Problem #1 Crohn's colitis in remission. We will refill her sulfasalazine and folic acid 1 mg daily. She will be due for a recall colonoscopy in January 2019.  Problem #2 Hepatomegaly. Patient has a history of fatty liver. Her last liver function tests in November 2014 were normal. We will proceed with an upper abdominal ultrasound for evaluation of the hepatomegaly.  Problem #3 History of B12 deficiency. She has been on B12 supplements monthly and will have repeat B12 levels today.    Delfin Edis 08/10/2013

## 2013-08-11 ENCOUNTER — Other Ambulatory Visit: Payer: Self-pay | Admitting: *Deleted

## 2013-08-11 DIAGNOSIS — E538 Deficiency of other specified B group vitamins: Secondary | ICD-10-CM

## 2013-08-16 ENCOUNTER — Ambulatory Visit (HOSPITAL_COMMUNITY)
Admission: RE | Admit: 2013-08-16 | Discharge: 2013-08-16 | Disposition: A | Discharge status: Home / Self Care | Payer: Medicare Other | Source: Ambulatory Visit | Attending: Internal Medicine | Admitting: Internal Medicine

## 2013-08-16 DIAGNOSIS — Z8719 Personal history of other diseases of the digestive system: Secondary | ICD-10-CM | POA: Insufficient documentation

## 2013-08-16 DIAGNOSIS — N281 Cyst of kidney, acquired: Secondary | ICD-10-CM | POA: Insufficient documentation

## 2013-08-16 DIAGNOSIS — R160 Hepatomegaly, not elsewhere classified: Secondary | ICD-10-CM

## 2013-12-13 ENCOUNTER — Telehealth: Payer: Self-pay | Admitting: Internal Medicine

## 2013-12-13 MED ORDER — MESALAMINE 1.2 G PO TBEC: 2.4000 g | DELAYED_RELEASE_TABLET | Freq: Every day | ORAL | Status: DC

## 2013-12-13 NOTE — Telephone Encounter (Signed)
Left message for patient to call back.

## 2013-12-13 NOTE — Telephone Encounter (Signed)
Dr Glenetta Borg advise

## 2013-12-13 NOTE — Telephone Encounter (Signed)
I have spoken to patient and have advised her that we are glad to switch her back to Lialda (patient states that it will cost $45 monthly). However, we will be unable to provide her with samples. She verbalizes understanding and states that she has been itching for a few months with the sulfasalazine and would like to see if it subsides with Lialda. Rx sent to pharmacy.

## 2013-12-13 NOTE — Telephone Encounter (Signed)
OK to switch to Lialda 1.2 gm, 2 po qd, #60 or #180. 3 refills.We used to give her samples but we cannot give any more.

## 2013-12-14 ENCOUNTER — Telehealth: Payer: Self-pay | Admitting: Internal Medicine

## 2013-12-14 NOTE — Telephone Encounter (Signed)
Patient is switching from Sulfasalazine to Lialda. She was on Folic Acid for Sulfasalazine but was not sure if she needed to stay on it with Lialda. Please, advise.

## 2013-12-15 ENCOUNTER — Encounter: Payer: Self-pay | Admitting: Family

## 2013-12-15 MED ORDER — SULFASALAZINE 500 MG PO TABS
ORAL_TABLET | ORAL | Status: DC
Start: 2013-12-15 — End: 2014-02-08

## 2013-12-15 MED ORDER — FOLIC ACID 1 MG PO TABS: 1.0000 mg | ORAL_TABLET | Freq: Every day | ORAL | Status: DC

## 2013-12-15 NOTE — Telephone Encounter (Signed)
OK, also will need Folic acid 39m po qd.

## 2013-12-15 NOTE — Telephone Encounter (Signed)
No Folic acid needed if she is on Lialda.

## 2013-12-15 NOTE — Telephone Encounter (Signed)
Spoke with patient and her Doristine Johns was going to cost $145 not $45. She wants to continue on Sulfasalazine(she c/o itching with this) Is it ok for her to continue?

## 2013-12-15 NOTE — Telephone Encounter (Signed)
Rx's sent to pharmacy.

## 2013-12-16 ENCOUNTER — Encounter: Payer: Self-pay | Admitting: Family

## 2013-12-16 ENCOUNTER — Ambulatory Visit (INDEPENDENT_AMBULATORY_CARE_PROVIDER_SITE_OTHER): Payer: Medicare Other | Admitting: Family

## 2013-12-16 ENCOUNTER — Ambulatory Visit (HOSPITAL_COMMUNITY)
Admission: RE | Admit: 2013-12-16 | Discharge: 2013-12-16 | Disposition: A | Discharge status: Home / Self Care | Payer: Medicare Other | Source: Ambulatory Visit | Attending: Family | Admitting: Family

## 2013-12-16 VITALS — BP 113/56 | HR 53 | Resp 14 | Ht 61.0 in | Wt 231.0 lb

## 2013-12-16 DIAGNOSIS — I6529 Occlusion and stenosis of unspecified carotid artery: Secondary | ICD-10-CM

## 2013-12-16 DIAGNOSIS — I658 Occlusion and stenosis of other precerebral arteries: Secondary | ICD-10-CM | POA: Insufficient documentation

## 2013-12-16 NOTE — Patient Instructions (Signed)
Stroke Prevention Some medical conditions and behaviors are associated with an increased chance of having a stroke. You may prevent a stroke by making healthy choices and managing medical conditions. HOW CAN I REDUCE MY RISK OF HAVING A STROKE?   Stay physically active. Get at least 30 minutes of activity on most or all days.  Do not smoke. It may also be helpful to avoid exposure to secondhand smoke.  Limit alcohol use. Moderate alcohol use is considered to be:  No more than 2 drinks per day for men.  No more than 1 drink per day for nonpregnant women.  Eat healthy foods. This involves  Eating 5 or more servings of fruits and vegetables a day.  Following a diet that addresses high blood pressure (hypertension), high cholesterol, diabetes, or obesity.  Manage your cholesterol levels.  A diet low in saturated fat, trans fat, and cholesterol and high in fiber may control cholesterol levels.  Take any prescribed medicines to control cholesterol as directed by your health care provider.  Manage your diabetes.  A controlled-carbohydrate, controlled-sugar diet is recommended to manage diabetes.  Take any prescribed medicines to control diabetes as directed by your health care provider.  Control your hypertension.  A low-salt (sodium), low-saturated fat, low-trans fat, and low-cholesterol diet is recommended to manage hypertension.  Take any prescribed medicines to control hypertension as directed by your health care provider.  Maintain a healthy weight.  A reduced-calorie, low-sodium, low-saturated fat, low-trans fat, low-cholesterol diet is recommended to manage weight.  Stop drug abuse.  Avoid taking birth control pills.  Talk to your health care provider about the risks of taking birth control pills if you are over 101 years old, smoke, get migraines, or have ever had a blood clot.  Get evaluated for sleep disorders (sleep apnea).  Talk to your health care provider about  getting a sleep evaluation if you snore a lot or have excessive sleepiness.  Take medicines as directed by your health care provider.  For some people, aspirin or blood thinners (anticoagulants) are helpful in reducing the risk of forming abnormal blood clots that can lead to stroke. If you have the irregular heart rhythm of atrial fibrillation, you should be on a blood thinner unless there is a good reason you cannot take them.  Understand all your medicine instructions.  Make sure that other other conditions (such as anemia or atherosclerosis) are addressed. SEEK IMMEDIATE MEDICAL CARE IF:   You have sudden weakness or numbness of the face, arm, or leg, especially on one side of the body.  Your face or eyelid droops to one side.  You have sudden confusion.  You have trouble speaking (aphasia) or understanding.  You have sudden trouble seeing in one or both eyes.  You have sudden trouble walking.  You have dizziness.  You have a loss of balance or coordination.  You have a sudden, severe headache with no known cause.  You have new chest pain or an irregular heartbeat. Any of these symptoms may represent a serious problem that is an emergency. Do not wait to see if the symptoms will go away. Get medical help at once. Call your local emergency services  (911 in [place] Do not drive yourself to the hospital. Document Released: 09/19/2004 Document Revised: 06/02/2013 Document Reviewed: 02/12/2013 [facility] Patient Information 2014 [place].   Smoking Cessation Quitting smoking is important to your health and has many advantages. However, it is not always easy to quit since nicotine is a  very addictive drug. Often times, people try 3 times or more before being able to quit. This document explains the best ways for you to prepare to quit smoking. Quitting takes hard work and a lot of effort, but you can do it. ADVANTAGES OF QUITTING SMOKING  You will live longer, feel better,  and live better.  Your body will feel the impact of quitting smoking almost immediately.  Within 20 minutes, blood pressure decreases. Your pulse returns to its normal level.  After 8 hours, carbon monoxide levels in the blood return to normal. Your oxygen level increases.  After 24 hours, the chance of having a heart attack starts to decrease. Your breath, hair, and body stop smelling like smoke.  After 48 hours, damaged nerve endings begin to recover. Your sense of taste and smell improve.  After 72 hours, the body is virtually free of nicotine. Your bronchial tubes relax and breathing becomes easier.  After 2 to 12 weeks, lungs can hold more air. Exercise becomes easier and circulation improves.  The risk of having a heart attack, stroke, cancer, or lung disease is greatly reduced.  After 1 year, the risk of coronary heart disease is cut in half.  After 5 years, the risk of stroke falls to the same as a nonsmoker.  After 10 years, the risk of lung cancer is cut in half and the risk of other cancers decreases significantly.  After 15 years, the risk of coronary heart disease drops, usually to the level of a nonsmoker.  If you are pregnant, quitting smoking will improve your chances of having a healthy baby.  The people you live with, especially any children, will be healthier.  You will have extra money to spend on things other than cigarettes. QUESTIONS TO THINK ABOUT BEFORE ATTEMPTING TO QUIT You may want to talk about your answers with your caregiver.  Why do you want to quit?  If you tried to quit in the past, what helped and what did not?  What will be the most difficult situations for you after you quit? How will you plan to handle them?  Who can help you through the tough times? Your family? Friends? A caregiver?  What pleasures do you get from smoking? What ways can you still get pleasure if you quit? Here are some questions to ask your caregiver:  How can you  help me to be successful at quitting?  What medicine do you think would be best for me and how should I take it?  What should I do if I need more help?  What is smoking withdrawal like? How can I get information on withdrawal? GET READY  Set a quit date.  Change your environment by getting rid of all cigarettes, ashtrays, matches, and lighters in your home, car, or work. Do not let people smoke in your home.  Review your past attempts to quit. Think about what worked and what did not. GET SUPPORT AND ENCOURAGEMENT You have a better chance of being successful if you have help. You can get support in many ways.  Tell your family, friends, and co-workers that you are going to quit and need their support. Ask them not to smoke around you.  Get individual, group, or telephone counseling and support. Programs are available at [employer] and health centers. Call your local health department for information about programs in your area.  Spiritual beliefs and practices may help some smokers quit.  Download a "quit meter" on your computer  to keep track of quit statistics, such as how long you have gone without smoking, cigarettes not smoked, and money saved.  Get a self-help book about quitting smoking and staying off of tobacco. Cary yourself from urges to smoke. Talk to someone, go for a walk, or occupy your time with a task.  Change your normal routine. Take a different route to work. Drink tea instead of coffee. Eat breakfast in a different place.  Reduce your stress. Take a hot bath, exercise, or read a book.  Plan something enjoyable to do every day. Reward yourself for not smoking.  Explore interactive web-based programs that specialize in helping you quit. GET MEDICINE AND USE IT CORRECTLY Medicines can help you stop smoking and decrease the urge to smoke. Combining medicine with the above behavioral methods and support can greatly increase  your chances of successfully quitting smoking.  Nicotine replacement therapy helps deliver nicotine to your body without the negative effects and risks of smoking. Nicotine replacement therapy includes nicotine gum, lozenges, inhalers, nasal sprays, and skin patches. Some may be available over-the-counter and others require a prescription.  Antidepressant medicine helps people abstain from smoking, but how this works is unknown. This medicine is available by prescription.  Nicotinic receptor partial agonist medicine simulates the effect of nicotine in your brain. This medicine is available by prescription. Ask your caregiver for advice about which medicines to use and how to use them based on your health history. Your caregiver will tell you what side effects to look out for if you choose to be on a medicine or therapy. Carefully read the information on the package. Do not use any other product containing nicotine while using a nicotine replacement product.  RELAPSE OR DIFFICULT SITUATIONS Most relapses occur within the first 3 months after quitting. Do not be discouraged if you start smoking again. Remember, most people try several times before finally quitting. You may have symptoms of withdrawal because your body is used to nicotine. You may crave cigarettes, be irritable, feel very hungry, cough often, get headaches, or have difficulty concentrating. The withdrawal symptoms are only temporary. They are strongest when you first quit, but they will go away within 10 14 days. To reduce the chances of relapse, try to:  Avoid drinking alcohol. Drinking lowers your chances of successfully quitting.  Reduce the amount of caffeine you consume. Once you quit smoking, the amount of caffeine in your body increases and can give you symptoms, such as a rapid heartbeat, sweating, and anxiety.  Avoid smokers because they can make you want to smoke.  Do not let weight gain distract you. Many smokers will gain  weight when they quit, usually less than 10 pounds. Eat a healthy diet and stay active. You can always lose the weight gained after you quit.  Find ways to improve your mood other than smoking. FOR MORE INFORMATION  www.smokefree.gov  Document Released: 08/06/2001 Document Revised: 02/11/2012 Document Reviewed: 11/21/2011 [facility] Patient Information 2014 [place], [place].   Venous Stasis or Chronic Venous Insufficiency Chronic venous insufficiency, also called venous stasis, is a condition that affects the veins in the legs. The condition prevents blood from being pumped through these veins effectively. Blood may no longer be pumped effectively from the legs back to the heart. This condition can range from mild to severe. With proper treatment, you should be able to continue with an active life. CAUSES  Chronic venous insufficiency occurs when the vein walls become  stretched, weakened, or damaged or when valves within the vein are damaged. Some common causes of this include:  High blood pressure inside the veins (venous hypertension).  Increased blood pressure in the leg veins from long periods of sitting or standing.  A blood clot that blocks blood flow in a vein (deep vein thrombosis).  Inflammation of a superficial vein (phlebitis) that causes a blood clot to form. RISK FACTORS Various things can make you more likely to develop chronic venous insufficiency, including:  Family history of this condition.  Obesity.  Pregnancy.  Sedentary lifestyle.  Smoking.  Jobs requiring long periods of standing or sitting in one place.  Being a certain age. Women in their 26s and 57s and men in their 59s are more likely to develop this condition. SIGNS AND SYMPTOMS  Symptoms may include:   Varicose veins.  Skin breakdown or ulcers.  Reddened or discolored skin on the leg.  Brown, smooth, tight, and painful skin just above the ankle, usually on the inside surface  (lipodermatosclerosis).  Swelling. DIAGNOSIS  To diagnose this condition, your health care provider will take a medical history and do a physical exam. The following tests may be ordered to confirm the diagnosis:  Duplex ultrasound A procedure that produces a picture of a blood vessel and nearby organs and also provides information on blood flow through the blood vessel.  Plethysmography A procedure that tests blood flow.  A venogram, or venography A procedure used to look at the veins using X-ray and dye. TREATMENT The goals of treatment are to help you return to an active life and to minimize pain or disability. Treatment will depend on the severity of the condition. Medical procedures may be needed for severe cases. Treatment options may include:   Use of compression stockings. These can help with symptoms and lower the chances of the problem getting worse, but they do not cure the problem.  Sclerotherapy A procedure involving an injection of a material that "dissolves" the damaged veins. Other veins in the network of blood vessels take over the function of the damaged veins.  Surgery to remove the vein or cut off blood flow through the vein (vein stripping or laser ablation surgery).  Surgery to repair a valve. HOME CARE INSTRUCTIONS   Wear compression stockings as directed by your health care provider.  Only take over-the-counter or prescription medicines for pain, discomfort, or fever as directed by your health care provider.  Follow up with your health care provider as directed. SEEK MEDICAL CARE IF:   You have redness, swelling, or increasing pain in the affected area.  You see a red streak or line that extends up or down from the affected area.  You have a breakdown or loss of skin in the affected area, even if the breakdown is small.  You have an injury to the affected area. SEEK IMMEDIATE MEDICAL CARE IF:   You have an injury and open wound in the affected  area.  Your pain is severe and does not improve with medicine.  You have sudden numbness or weakness in the foot or ankle below the affected area, or you have trouble moving your foot or ankle.  You have a fever or persistent symptoms for more than 2 3 days.  You have a fever and your symptoms suddenly get worse. MAKE SURE YOU:   Understand these instructions.  Will watch your condition.  Will get help right away if you are not doing well or get worse.  Document Released: 12/16/2006 Document Revised: 06/02/2013 Document Reviewed: 04/19/2013 [facility] Patient Information 2014 [place].

## 2013-12-16 NOTE — Addendum Note (Signed)
Addended by: Dorthula Rue L on: 12/16/2013 04:14 PM   Modules accepted: Orders

## 2013-12-16 NOTE — Progress Notes (Signed)
Established Carotid Patient   History of Present Illness  Shelley [name] is a 73 y.o. female patient of Dr. Oneida Alar with no history of carotid intervention.  She returns for surveillance.  The patient does have a history of MI and 2 cardiac stents placed in 2004 and TIA symptoms many years ago manifested as transient right arm weakness, but denies history of stroke.  She has had no further stroke or TIA symptoms.  The patient denies amaurosis fugax or monocular blindness. The patient denies facial drooping.  Pt. denies current hemiplegia. The patient denies receptive or expressive aphasia.  Patient denies non-healing wounds.  Patient denies New Medical or Surgical History.  She lives in a 3 story house and climbs stairs several times/day, but her ambulation is otherwise limited by arthritis and pain from lumbar spine problems: DDD, HNP, arthritis, left leg sciatica.  Patient states current sinus issues with pollen.  Pt Diabetic: No, she is checked every 6 months Pt smoker: smoker (1/2 ppd x since high school, quit several times in these yrs)  Pt meds include:  Statin : Yes  Betablocker: Yes  ASA: Yes  Other anticoagulants/antiplatelets: Plavix   Past Medical History  Diagnosis Date  . COPD (chronic obstructive pulmonary disease)   . Osteoarthritis   . Sinus polyp   . Mixed hyperlipidemia   . Regional enteritis of large intestine   . Pancreatitis   . Rectal bleeding   . Hepatic steatosis   . History of oral aphthous ulcers   . Essential hypertension, benign   . TIA (transient ischemic attack)   . Hx of adenomatous colonic polyps   . Carotid artery disease     36-14% RICA and LICA 4/31 - Dr. Oneida Alar  . Coronary atherosclerosis of native coronary artery     Managed medically  . Myocardial infarction     IMI 10/04  . History of Salmonella gastroenteritis   . Cancer     Melanoma  . Anxiety   . Depression   . Cataract   . Osteoporosis   . Tubular adenoma of colon  08/2012  . Stroke 2000    mini  . DVT (deep venous thrombosis)     Social History History  Substance Use Topics  . Smoking status: Current Some Day Smoker -- 0.25 packs/day for 40 years    Types: Cigarettes  . Smokeless tobacco: Never Used  . Alcohol Use: No    Family History Family History  Problem Relation Age of Onset  . Diabetes type II Mother   . Heart attack Mother 59    questionable  . Uterine cancer Mother   . Diabetes Mother   . Heart disease Mother     before age 11  . Cancer Mother   . Varicose Veins Mother   . Colon cancer Neg Hx   . Other Brother     PVD and hx DVT  . Deep vein thrombosis Brother   . Breast cancer Sister   . Cancer Sister     Breast  . Hyperlipidemia Sister   . Hypertension Sister     Surgical History Past Surgical History  Procedure Laterality Date  . Right total knee arthroplasty  2/12  . Melanoma resection  1996  . Cataract extraction      left eye  . Eye surgery    . Joint replacement  Feb 2012    Right Knee    Allergies  Allergen Reactions  . Mercaptopurine     REACTION:  pancreatitis  . Sulfa Antibiotics   . Penicillins Rash    REACTION: rash    Current Outpatient Prescriptions  Medication Sig Dispense Refill  . aspirin 81 MG tablet Take 81 mg by mouth daily.        . Calcium 600-200 MG-UNIT per tablet Take 1 tablet by mouth daily.        . Cholecalciferol (VITAMIN D3) 1000 UNITS CAPS Take 1 capsule by mouth daily.      . clopidogrel (PLAVIX) 75 MG tablet Take 1 tablet (75 mg total) by mouth daily with breakfast.  30 tablet  6  . cyanocobalamin (,VITAMIN B-12,) 1000 MCG/ML injection Inject 1 mL (1,000 mcg total) into the muscle every 30 (thirty) days.  12 mL  0  . Fexofenadine HCl (ALLEGRA ALLERGY PO) Take 1 tablet by mouth as needed.      Marland Kitchen FLUoxetine (PROZAC) 40 MG capsule Take 40 mg by mouth daily.      . folic acid (FOLVITE) 1 MG tablet Take 1 tablet (1 mg total) by mouth daily.  100 tablet  1  . glycopyrrolate  (ROBINUL) 2 MG tablet Take 1 tablet (2 mg total) by mouth 2 (two) times daily as needed.  60 tablet  2  . HYDROcodone-acetaminophen (LORCET) 10-650 MG per tablet Take 1 tablet by mouth every 12 (twelve) hours as needed.        . loratadine (CLARITIN) 10 MG tablet Take 10 mg by mouth as needed.       . metoprolol succinate (TOPROL-XL) 25 MG 24 hr tablet TAKE 1 TABLET BY MOUTH EVERY DAY  60 tablet  3  . nitroGLYCERIN (NITROSTAT) 0.4 MG SL tablet Place 1 tablet (0.4 mg total) under the tongue every 5 (five) minutes as needed.  25 tablet  2  . Omega-3 Fatty Acids (FISH OIL) 1200 MG CAPS Take 1 capsule by mouth daily.        . rosuvastatin (CRESTOR) 10 MG tablet Take 1 tablet (10 mg total) by mouth daily.  30 tablet  1  . sulfaSALAzine (AZULFIDINE) 500 MG tablet Take 3 tablets BID  180 tablet  1   No current facility-administered medications for this visit.    Review of Systems : See HPI for pertinent positives and negatives.  Physical Examination  Filed Vitals:   12/16/13 1156  BP: 113/56  Pulse: 53  Resp: 14   Filed Weights   12/16/13 1156  Weight: 231 lb (104.781 kg)   Body mass index is 43.67 kg/(m^2).  General: WDWN female in NAD, morbidly obese.  GAIT: uses cane, antalgic.  Eyes: PERRLA  Pulmonary: no dyspnea, some Rales in bases, Negative rhonchi, & few scattered inspiratory wheezes.  Cardiac: regular Rhythm , Negative Murmurs.   VASCULAR EXAM  Carotid Bruits  Left  Right    Negative  Negative   Aorta is not palpable.  Radial pulses are 2+ palpable and equal.   LE Pulses  LEFT  RIGHT   POPLITEAL  not palpable  not palpable   POSTERIOR TIBIAL  not palpable  not palpable   DORSALIS PEDIS  ANTERIOR TIBIAL  palpable  palpable    Gastrointestinal: soft, nontender, BS WNL, no r/g, negative masses.  Musculoskeletal: Negative muscle atrophy/wasting. M/S 5/5 throughout except left lower extremity is 4/5, Extremities without ischemic changes. Chronic venous stasis changes in  lower legs with 1+ bilateral pitting edema, leathery skin, no ulcerations.  Neurologic: A&O X 3; Appropriate Affect ; SENSATION ;normal;  Speech is normal  CN 2-12 intact except, Pain and light touch intact in extremities, Motor exam as listed above.   Non-Invasive Vascular Imaging  CAROTID DUPLEX 06/10/2013  Right ICA: 60 - 79 % stenosis.  Left ICA: 60 - 79 % stenosis.   Non-Invasive Vascular Imaging CAROTID DUPLEX 12/16/2013   CEREBROVASCULAR DUPLEX EVALUATION    INDICATION: Carotid artery disease    PREVIOUS INTERVENTION(S): None    DUPLEX EXAM: Carotid duplex    RIGHT  LEFT  Peak Systolic Velocities (cm/s) End Diastolic Velocities (cm/s) Plaque LOCATION Peak Systolic Velocities (cm/s) End Diastolic Velocities (cm/s) Plaque  128 24 HT CCA PROXIMAL 93 24 HT  165 42 HT CCA MID 104 24 HT  153 35 HT CCA DISTAL 107 25 HT  111 9 HT ECA 154 11 HT  355 105 HT ICA PROXIMAL 248 66 HT  122 31 - ICA MID 163 39 -  87 28 - ICA DISTAL 142 28 -    2.1 ICA / CCA Ratio (PSV) 2.3  Antegrade Vertebral Flow Antegrade  367 Brachial Systolic Pressure (mmHg) 255  Triphasic Brachial Artery Waveforms Triphasic    Plaque Morphology:  HM = Homogeneous, HT = Heterogeneous, CP = Calcific Plaque, SP = Smooth Plaque, IP = Irregular Plaque     ADDITIONAL FINDINGS: Diffuse disease of the bilateral common carotid artery .    IMPRESSION: 1. 60 - 70% bilateral internal carotid artery stenosis    Compared to the previous exam:  No change.      Assessment: Shelley [name] is a 73 y.o. female who presents with asymptomatic moderate/severe bilateral ICA stenosis. The  ICA stenosis is  Unchanged from previous exam six months ago. She also has chronic venous insufficiency in her lower legs. Unfortunately she continues to smoke. Another atherosclerotic risk factor for her is morbid obesity.  Plan:  Patient was counseled re smoking cessation. She was advised to use her stationary bike at least 10  minutes daily. Follow-up in 6 months with Carotid Duplex scan.   I discussed in depth with the patient the nature of atherosclerosis, and emphasized the importance of maximal medical management including strict control of blood pressure, blood glucose, and lipid levels, obtaining regular exercise, and cessation of smoking.  The patient is aware that without maximal medical management the underlying atherosclerotic disease process will progress, limiting the benefit of any interventions. The patient was given information about stroke prevention and what symptoms should prompt the patient to seek immediate medical care. Thank you for allowing [place] to participate in this patient's care.  Clemon Chambers, RN, MSN, FNP-C [facility] Office: [phone]  Clinic Physician: Oneida Alar  12/16/2013 12:31 PM

## 2014-01-10 ENCOUNTER — Encounter: Payer: Self-pay | Admitting: Cardiology

## 2014-01-10 ENCOUNTER — Ambulatory Visit (INDEPENDENT_AMBULATORY_CARE_PROVIDER_SITE_OTHER): Payer: Medicare Other | Admitting: Cardiology

## 2014-01-10 ENCOUNTER — Other Ambulatory Visit: Payer: Self-pay | Admitting: *Deleted

## 2014-01-10 VITALS — BP 138/64 | HR 78 | Ht 66.0 in | Wt 228.1 lb

## 2014-01-10 DIAGNOSIS — J4489 Other specified chronic obstructive pulmonary disease: Secondary | ICD-10-CM

## 2014-01-10 DIAGNOSIS — J449 Chronic obstructive pulmonary disease, unspecified: Secondary | ICD-10-CM

## 2014-01-10 DIAGNOSIS — E782 Mixed hyperlipidemia: Secondary | ICD-10-CM

## 2014-01-10 DIAGNOSIS — I2510 Atherosclerotic heart disease of native coronary artery without angina pectoris: Secondary | ICD-10-CM

## 2014-01-10 DIAGNOSIS — I6529 Occlusion and stenosis of unspecified carotid artery: Secondary | ICD-10-CM

## 2014-01-10 MED ORDER — NITROGLYCERIN 0.4 MG SL SUBL: 0.4000 mg | SUBLINGUAL_TABLET | SUBLINGUAL | Status: DC | PRN

## 2014-01-10 MED ORDER — CLOPIDOGREL BISULFATE 75 MG PO TABS: 75.0000 mg | ORAL_TABLET | Freq: Every day | ORAL | Status: DC

## 2014-01-10 MED ORDER — METOPROLOL SUCCINATE ER 25 MG PO TB24: 25.0000 mg | ORAL_TABLET | Freq: Every day | ORAL | Status: DC

## 2014-01-10 MED ORDER — ROSUVASTATIN CALCIUM 10 MG PO TABS: 10.0000 mg | ORAL_TABLET | Freq: Every day | ORAL | Status: DC

## 2014-01-10 NOTE — Assessment & Plan Note (Signed)
Symptomatically stable on medical therapy. Most recent ischemic workup noted above. She denies any angina or nitroglycerin requirement. Followup arranged.

## 2014-01-10 NOTE — Assessment & Plan Note (Signed)
Recent followup with [facility] noted in April.

## 2014-01-10 NOTE — Patient Instructions (Signed)

## 2014-01-10 NOTE — Assessment & Plan Note (Signed)
Continues to smoke. She has had some recent respiratory symptoms in the setting of pollen and allergies. Also reportedly some mild wheezing. I recommended that she follow up with Dr. Scotty Court, consider a chest x-ray.

## 2014-01-10 NOTE — Assessment & Plan Note (Signed)
LDL has been well controlled on Crestor.

## 2014-01-10 NOTE — Progress Notes (Signed)
Clinical Summary Ms. Humann is a 73 y.o.female last seen in November 2014. She reports no angina symptoms. Has been having a lot of trouble with pollen this Spring, has had intermittent wheezing and shortness of breath as well. Headaches and sinus pressure. She reports compliance with her cardiac medications.  Cardiolite from October 2011 demonstrated evidence of probable mid to basal inferior scar without ischemia, LVEF 50%. She has been managed medically.  Followup with [facility] noted in April of this year for followup of 60-70% bilateral internal carotid artery stenoses.  Lab work from November 2014 showed normal LFTs, cholesterol 150, triglycerides 85, HDL 73, and LDL 60. She had repeat lab work done in March of this year with total cholesterol 146, triglycerides 117, HDL 64, and LDL 59.   Allergies  Allergen Reactions  . Mercaptopurine     REACTION: pancreatitis  . Sulfa Antibiotics   . Penicillins Rash    REACTION: rash    Current Outpatient Prescriptions  Medication Sig Dispense Refill  . aspirin 81 MG tablet Take 81 mg by mouth daily.        . Calcium 600-200 MG-UNIT per tablet Take 1 tablet by mouth daily.        . Cholecalciferol (VITAMIN D3) 1000 UNITS CAPS Take 1 capsule by mouth daily.      . clopidogrel (PLAVIX) 75 MG tablet Take 1 tablet (75 mg total) by mouth daily with breakfast.  30 tablet  6  . Fexofenadine HCl (ALLEGRA ALLERGY PO) Take 1 tablet by mouth as needed.      Marland Kitchen FLUoxetine (PROZAC) 40 MG capsule Take 40 mg by mouth daily.      . folic acid (FOLVITE) 1 MG tablet Take 1 tablet (1 mg total) by mouth daily.  100 tablet  1  . glycopyrrolate (ROBINUL) 2 MG tablet Take 1 tablet (2 mg total) by mouth 2 (two) times daily as needed.  60 tablet  2  . HYDROcodone-acetaminophen (LORCET) 10-650 MG per tablet Take 1 tablet by mouth every 12 (twelve) hours as needed.        . loratadine (CLARITIN) 10 MG tablet Take 10 mg by mouth as needed.       . metoprolol succinate  (TOPROL-XL) 25 MG 24 hr tablet Take 1 tablet (25 mg total) by mouth daily.  60 tablet  3  . nitroGLYCERIN (NITROSTAT) 0.4 MG SL tablet Place 1 tablet (0.4 mg total) under the tongue every 5 (five) minutes as needed.  25 tablet  2  . Omega-3 Fatty Acids (FISH OIL) 1200 MG CAPS Take 1 capsule by mouth daily.        . rosuvastatin (CRESTOR) 10 MG tablet Take 1 tablet (10 mg total) by mouth daily.  30 tablet  1  . sulfaSALAzine (AZULFIDINE) 500 MG tablet Take 3 tablets BID  180 tablet  1   No current facility-administered medications for this visit.    Past Medical History  Diagnosis Date  . COPD (chronic obstructive pulmonary disease)   . Osteoarthritis   . Sinus polyp   . Mixed hyperlipidemia   . Regional enteritis of large intestine   . Pancreatitis   . Rectal bleeding   . Hepatic steatosis   . History of oral aphthous ulcers   . Essential hypertension, benign   . TIA (transient ischemic attack)   . Hx of adenomatous colonic polyps   . Carotid artery disease     96-04% RICA and LICA 5/40 - Dr. Oneida Alar  .  Coronary atherosclerosis of native coronary artery     Managed medically  . Myocardial infarction     IMI 10/04  . History of Salmonella gastroenteritis   . Cancer     Melanoma  . Anxiety   . Depression   . Cataract   . Osteoporosis   . Tubular adenoma of colon 08/2012  . Stroke 2000    mini  . DVT (deep venous thrombosis)     Social History Ms. Mcglothen reports that she has been smoking Cigarettes.  She has a 10 pack-year smoking history. She has never used smokeless tobacco. Ms. Strada reports that she does not drink alcohol.  Review of Systems Negative except as outlined above.  Physical Examination Filed Vitals:   01/10/14 1010  BP: 138/64  Pulse: 78   Filed Weights   01/10/14 1010  Weight: 228 lb 1.9 oz (103.475 kg)    Comfortable in no acute distress.  HEENT: Conjuctivae and lids normal, oropharynx clear with moist mucosa.  Neck: Supple, no elevated JVP,  soft right carotid bruit, no thyromegaly or tenderness.  Lungs: Somewhat prolonged expiratory phase with scattered rhonchi, no frank wheezing. Nonlabored at rest. Cor: RRR, no pathologic systolic murmurs. No S3 or rub.  Ext: Mild edema and venous stasis. Distal pulses 1-2+.  Skin: Warm and dry.  Musculoskeletal: No gross deformities.  Neuropsychiatric: Alert and oriented x3, affect appropriate.   Problem List and Plan   CORONARY ATHEROSCLEROSIS, NATIVE VESSEL Symptomatically stable on medical therapy. Most recent ischemic workup noted above. She denies any angina or nitroglycerin requirement. Followup arranged.  CAROTID ARTERY DISEASE Recent followup with [facility] noted in April.  COPD Continues to smoke. She has had some recent respiratory symptoms in the setting of pollen and allergies. Also reportedly some mild wheezing. I recommended that she follow up with Dr. Scotty Court, consider a chest x-ray.  Mixed hyperlipidemia LDL has been well controlled on Crestor.    Satira Sark, M.D., F.A.C.C.

## 2014-02-03 ENCOUNTER — Telehealth: Payer: Self-pay | Admitting: *Deleted

## 2014-02-03 NOTE — Telephone Encounter (Signed)
Spoke with patient and she will come for labs. 

## 2014-02-03 NOTE — Telephone Encounter (Signed)
Message copied by Hulan Saas on Thu Feb 03, 2014 11:13 AM ------      Message from: Hulan Saas      Created: Wed Aug 11, 2013  2:50 PM       Call and remind patient due for B12 level for DB 02/07/14. ------

## 2014-02-07 ENCOUNTER — Other Ambulatory Visit (INDEPENDENT_AMBULATORY_CARE_PROVIDER_SITE_OTHER): Payer: Medicare Other

## 2014-02-07 DIAGNOSIS — E538 Deficiency of other specified B group vitamins: Secondary | ICD-10-CM

## 2014-02-07 LAB — VITAMIN B12: VITAMIN B 12: 915 pg/mL — AB (ref 211–911)

## 2014-02-08 ENCOUNTER — Other Ambulatory Visit: Payer: Self-pay | Admitting: *Deleted

## 2014-02-08 ENCOUNTER — Telehealth: Payer: Self-pay | Admitting: *Deleted

## 2014-02-08 DIAGNOSIS — E538 Deficiency of other specified B group vitamins: Secondary | ICD-10-CM

## 2014-02-08 MED ORDER — MESALAMINE 1.2 G PO TBEC: DELAYED_RELEASE_TABLET | ORAL | Status: DC

## 2014-02-08 NOTE — Telephone Encounter (Signed)
Spoke with patient and she is asking if she can switch back to [place]. She thinks she felt better on it. She is currently on Sulfasalazine 3 tablets BID. Please, advise.

## 2014-02-08 NOTE — Telephone Encounter (Signed)
Rx sent to pharmacy. Left a message for patient to call me.

## 2014-02-08 NOTE — Telephone Encounter (Signed)
Please resume Lialda 1.2 gm 2 po qd, #60, 10 refills.

## 2014-02-08 NOTE — Telephone Encounter (Signed)
Patient notified that Rx has been sent to pharmacy.

## 2014-05-31 ENCOUNTER — Telehealth: Payer: Self-pay | Admitting: Internal Medicine

## 2014-05-31 NOTE — Telephone Encounter (Signed)
OK to switch to Sulfasalazine 576m, 2 po bid, #120, or #360 for 90 day supply.., Folic acid 153m #[id number] po qd..Marland Kitchen

## 2014-05-31 NOTE — Telephone Encounter (Signed)
Dr [name], please advise.Marland KitchenMarland KitchenSee phone note from 02/08/14.

## 2014-06-01 MED ORDER — SULFASALAZINE 500 MG PO TABS
1000.0000 mg | ORAL_TABLET | Freq: Two times a day (BID) | ORAL | Status: DC
Start: 2014-06-01 — End: 2014-08-29

## 2014-06-01 MED ORDER — FOLIC ACID 1 MG PO TABS: 1.0000 mg | ORAL_TABLET | Freq: Every day | ORAL | Status: DC

## 2014-06-01 NOTE — Telephone Encounter (Signed)
Rx for sulfasalazine sent in place of Lialda. Patient advised.

## 2014-06-01 NOTE — Telephone Encounter (Signed)
Left message for patient to call back.

## 2014-06-16 ENCOUNTER — Encounter: Payer: Self-pay | Admitting: Family

## 2014-06-17 ENCOUNTER — Ambulatory Visit (INDEPENDENT_AMBULATORY_CARE_PROVIDER_SITE_OTHER): Payer: Medicare Other | Admitting: Family

## 2014-06-17 ENCOUNTER — Encounter: Payer: Self-pay | Admitting: Family

## 2014-06-17 ENCOUNTER — Ambulatory Visit (HOSPITAL_COMMUNITY)
Admission: RE | Admit: 2014-06-17 | Discharge: 2014-06-17 | Disposition: A | Discharge status: Home / Self Care | Payer: Medicare Other | Source: Ambulatory Visit | Attending: Family | Admitting: Family

## 2014-06-17 VITALS — BP 113/62 | HR 54 | Resp 16 | Ht 66.5 in | Wt 219.0 lb

## 2014-06-17 DIAGNOSIS — I6529 Occlusion and stenosis of unspecified carotid artery: Secondary | ICD-10-CM | POA: Insufficient documentation

## 2014-06-17 DIAGNOSIS — I6523 Occlusion and stenosis of bilateral carotid arteries: Secondary | ICD-10-CM | POA: Insufficient documentation

## 2014-06-17 NOTE — Patient Instructions (Signed)
Stroke Prevention Some medical conditions and behaviors are associated with an increased chance of having a stroke. You may prevent a stroke by making healthy choices and managing medical conditions. HOW CAN I REDUCE MY RISK OF HAVING A STROKE?   Stay physically active. Get at least 30 minutes of activity on most or all days.  Do not smoke. It may also be helpful to avoid exposure to secondhand smoke.  Limit alcohol use. Moderate alcohol use is considered to be:  No more than 2 drinks per day for men.  No more than 1 drink per day for nonpregnant women.  Eat healthy foods. This involves:  Eating 5 or more servings of fruits and vegetables a day.  Making dietary changes that address high blood pressure (hypertension), high cholesterol, diabetes, or obesity.  Manage your cholesterol levels.  Making food choices that are high in fiber and low in saturated fat, trans fat, and cholesterol may control cholesterol levels.  Take any prescribed medicines to control cholesterol as directed by your health care provider.  Manage your diabetes.  Controlling your carbohydrate and sugar intake is recommended to manage diabetes.  Take any prescribed medicines to control diabetes as directed by your health care provider.  Control your hypertension.  Making food choices that are low in salt (sodium), saturated fat, trans fat, and cholesterol is recommended to manage hypertension.  Take any prescribed medicines to control hypertension as directed by your health care provider.  Maintain a healthy weight.  Reducing calorie intake and making food choices that are low in sodium, saturated fat, trans fat, and cholesterol are recommended to manage weight.  Stop drug abuse.  Avoid taking birth control pills.  Talk to your health care provider about the risks of taking birth control pills if you are over 35 years old, smoke, get migraines, or have ever had a blood clot.  Get evaluated for sleep  disorders (sleep apnea).  Talk to your health care provider about getting a sleep evaluation if you snore a lot or have excessive sleepiness.  Take medicines only as directed by your health care provider.  For some people, aspirin or blood thinners (anticoagulants) are helpful in reducing the risk of forming abnormal blood clots that can lead to stroke. If you have the irregular heart rhythm of atrial fibrillation, you should be on a blood thinner unless there is a good reason you cannot take them.  Understand all your medicine instructions.  Make sure that other conditions (such as anemia or atherosclerosis) are addressed. SEEK IMMEDIATE MEDICAL CARE IF:   You have sudden weakness or numbness of the face, arm, or leg, especially on one side of the body.  Your face or eyelid droops to one side.  You have sudden confusion.  You have trouble speaking (aphasia) or understanding.  You have sudden trouble seeing in one or both eyes.  You have sudden trouble walking.  You have dizziness.  You have a loss of balance or coordination.  You have a sudden, severe headache with no known cause.  You have new chest pain or an irregular heartbeat. Any of these symptoms may represent a serious problem that is an emergency. Do not wait to see if the symptoms will go away. Get medical help at once. Call your local emergency services (911 in [place] Do not drive yourself to the hospital. Document Released: 09/19/2004 Document Revised: 12/27/2013 Document Reviewed: 02/12/2013 [employer] Patient Information 2015 [employer]. This information is not intended to replace advice given   to you by your health care provider. Make sure you discuss any questions you have with your health care provider.   Smoking Cessation Quitting smoking is important to your health and has many advantages. However, it is not always easy to quit since nicotine is a very addictive drug. Oftentimes, people try 3 times or more  before being able to quit. This document explains the best ways for you to prepare to quit smoking. Quitting takes hard work and a lot of effort, but you can do it. ADVANTAGES OF QUITTING SMOKING  You will live longer, feel better, and live better.  Your body will feel the impact of quitting smoking almost immediately.  Within 20 minutes, blood pressure decreases. Your pulse returns to its normal level.  After 8 hours, carbon monoxide levels in the blood return to normal. Your oxygen level increases.  After 24 hours, the chance of having a heart attack starts to decrease. Your breath, hair, and body stop smelling like smoke.  After 48 hours, damaged nerve endings begin to recover. Your sense of taste and smell improve.  After 72 hours, the body is virtually free of nicotine. Your bronchial tubes relax and breathing becomes easier.  After 2 to 12 weeks, lungs can hold more air. Exercise becomes easier and circulation improves.  The risk of having a heart attack, stroke, cancer, or lung disease is greatly reduced.  After 1 year, the risk of coronary heart disease is cut in half.  After 5 years, the risk of stroke falls to the same as a nonsmoker.  After 10 years, the risk of lung cancer is cut in half and the risk of other cancers decreases significantly.  After 15 years, the risk of coronary heart disease drops, usually to the level of a nonsmoker.  If you are pregnant, quitting smoking will improve your chances of having a healthy baby.  The people you live with, especially any children, will be healthier.  You will have extra money to spend on things other than cigarettes. QUESTIONS TO THINK ABOUT BEFORE ATTEMPTING TO QUIT You may want to talk about your answers with your health care provider.  Why do you want to quit?  If you tried to quit in the past, what helped and what did not?  What will be the most difficult situations for you after you quit? How will you plan to  handle them?  Who can help you through the tough times? Your family? Friends? A health care provider?  What pleasures do you get from smoking? What ways can you still get pleasure if you quit? Here are some questions to ask your health care provider:  How can you help me to be successful at quitting?  What medicine do you think would be best for me and how should I take it?  What should I do if I need more help?  What is smoking withdrawal like? How can I get information on withdrawal? GET READY  Set a quit date.  Change your environment by getting rid of all cigarettes, ashtrays, matches, and lighters in your home, car, or work. Do not let people smoke in your home.  Review your past attempts to quit. Think about what worked and what did not. GET SUPPORT AND ENCOURAGEMENT You have a better chance of being successful if you have help. You can get support in many ways.  Tell your family, friends, and coworkers that you are going to quit and need their support. Ask them not  to smoke around you.  Get individual, group, or telephone counseling and support. Programs are available at [employer] and health centers. Call your local health department for information about programs in your area.  Spiritual beliefs and practices may help some smokers quit.  Download a "quit meter" on your computer to keep track of quit statistics, such as how long you have gone without smoking, cigarettes not smoked, and money saved.  Get a self-help book about quitting smoking and staying off tobacco. Cheriton yourself from urges to smoke. Talk to someone, go for a walk, or occupy your time with a task.  Change your normal routine. Take a different route to work. Drink tea instead of coffee. Eat breakfast in a different place.  Reduce your stress. Take a hot bath, exercise, or read a book.  Plan something enjoyable to do every day. Reward yourself for not  smoking.  Explore interactive web-based programs that specialize in helping you quit. GET MEDICINE AND USE IT CORRECTLY Medicines can help you stop smoking and decrease the urge to smoke. Combining medicine with the above behavioral methods and support can greatly increase your chances of successfully quitting smoking.  Nicotine replacement therapy helps deliver nicotine to your body without the negative effects and risks of smoking. Nicotine replacement therapy includes nicotine gum, lozenges, inhalers, nasal sprays, and skin patches. Some may be available over-the-counter and others require a prescription.  Antidepressant medicine helps people abstain from smoking, but how this works is unknown. This medicine is available by prescription.  Nicotinic receptor partial agonist medicine simulates the effect of nicotine in your brain. This medicine is available by prescription. Ask your health care provider for advice about which medicines to use and how to use them based on your health history. Your health care provider will tell you what side effects to look out for if you choose to be on a medicine or therapy. Carefully read the information on the package. Do not use any other product containing nicotine while using a nicotine replacement product.  RELAPSE OR DIFFICULT SITUATIONS Most relapses occur within the first 3 months after quitting. Do not be discouraged if you start smoking again. Remember, most people try several times before finally quitting. You may have symptoms of withdrawal because your body is used to nicotine. You may crave cigarettes, be irritable, feel very hungry, cough often, get headaches, or have difficulty concentrating. The withdrawal symptoms are only temporary. They are strongest when you first quit, but they will go away within 10-14 days. To reduce the chances of relapse, try to:  Avoid drinking alcohol. Drinking lowers your chances of successfully quitting.  Reduce the  amount of caffeine you consume. Once you quit smoking, the amount of caffeine in your body increases and can give you symptoms, such as a rapid heartbeat, sweating, and anxiety.  Avoid smokers because they can make you want to smoke.  Do not let weight gain distract you. Many smokers will gain weight when they quit, usually less than 10 pounds. Eat a healthy diet and stay active. You can always lose the weight gained after you quit.  Find ways to improve your mood other than smoking. FOR MORE INFORMATION  www.smokefree.gov  Document Released: 08/06/2001 Document Revised: 12/27/2013 Document Reviewed: 11/21/2011 [facility] Patient Information 2015 [place], [place]. This information is not intended to replace advice given to you by your health care provider. Make sure you discuss any questions you have with your health care  provider.

## 2014-06-17 NOTE — Progress Notes (Signed)
Established Carotid Patient   History of Present Illness  Shelley [name] is a 73 y.o. female patient of Dr. Oneida Alar with a history of carotid artery stenosis and no history of carotid intervention.  She returns for surveillance.  The patient does have a history of MI and 2 cardiac stents placed in 2004 and TIA symptoms many years ago manifested as transient right arm weakness, but denies history of stroke.  She has had no further stroke or TIA symptoms.  The patient denies amaurosis fugax or monocular blindness. The patient denies facial drooping.  Pt. denies current hemiplegia. The patient denies receptive or expressive aphasia.  Patient denies non-healing wounds.  Patient denies New Medical or Surgical History.  She lives in a 3 story house and climbs stairs several times/day, but her ambulation is otherwise limited by arthritis and pain from lumbar spine problems: DDD, HNP, arthritis, left leg sciatica.  Patient states current sinus issues with pollen.   Pt Diabetic: No, she is checked every 6 months, last A1C was 5.5, per pt Pt smoker: smoker (1/2 ppd x since high school, quit several times in these yrs)   Pt meds include:  Statin : Yes  Betablocker: Yes  ASA: Yes  Other anticoagulants/antiplatelets: Plavix   Past Medical History  Diagnosis Date  . COPD (chronic obstructive pulmonary disease)   . Osteoarthritis   . Sinus polyp   . Mixed hyperlipidemia   . Regional enteritis of large intestine   . Pancreatitis   . Rectal bleeding   . Hepatic steatosis   . History of oral aphthous ulcers   . Essential hypertension, benign   . TIA (transient ischemic attack)   . Hx of adenomatous colonic polyps   . Carotid artery disease     92-42% RICA and LICA 6/83 - Dr. Oneida Alar  . Coronary atherosclerosis of native coronary artery     Managed medically  . Myocardial infarction     IMI 10/04  . History of Salmonella gastroenteritis   . Cancer     Melanoma  . Anxiety   .  Depression   . Cataract   . Osteoporosis   . Tubular adenoma of colon 08/2012  . Stroke 2000    mini  . DVT (deep venous thrombosis)     Social History History  Substance Use Topics  . Smoking status: Light Tobacco Smoker -- 0.25 packs/day for 40 years    Types: Cigarettes  . Smokeless tobacco: Never Used     Comment: trying to quit  . Alcohol Use: No    Family History Family History  Problem Relation Age of Onset  . Diabetes type II Mother   . Heart attack Mother 37    questionable  . Uterine cancer Mother   . Diabetes Mother   . Heart disease Mother     before age 80  . Cancer Mother   . Varicose Veins Mother   . Colon cancer Neg Hx   . Other Brother     PVD and hx DVT  . Deep vein thrombosis Brother   . Breast cancer Sister   . Cancer Sister     Breast  . Hyperlipidemia Sister   . Hypertension Sister     Surgical History Past Surgical History  Procedure Laterality Date  . Right total knee arthroplasty  2/12  . Melanoma resection  1996  . Cataract extraction      left eye  . Eye surgery    . Joint replacement  Feb 2012    Right Knee    Allergies  Allergen Reactions  . Mercaptopurine     REACTION: pancreatitis  . Sulfa Antibiotics   . Penicillins Rash    REACTION: rash    Current Outpatient Prescriptions  Medication Sig Dispense Refill  . aspirin 81 MG tablet Take 81 mg by mouth daily.        . Calcium 600-200 MG-UNIT per tablet Take 1 tablet by mouth daily.        . Cholecalciferol (VITAMIN D3) 1000 UNITS CAPS Take 1 capsule by mouth daily.      . clopidogrel (PLAVIX) 75 MG tablet Take 1 tablet (75 mg total) by mouth daily with breakfast.  30 tablet  6  . FLUoxetine (PROZAC) 40 MG capsule Take 40 mg by mouth daily.      . folic acid (FOLVITE) 1 MG tablet Take 1 tablet (1 mg total) by mouth daily.  100 tablet  1  . glycopyrrolate (ROBINUL) 2 MG tablet Take 1 tablet (2 mg total) by mouth 2 (two) times daily as needed.  60 tablet  2  .  HYDROcodone-acetaminophen (LORCET) 10-650 MG per tablet Take 1 tablet by mouth every 12 (twelve) hours as needed.        . loratadine (CLARITIN) 10 MG tablet Take 10 mg by mouth as needed.       . metoprolol succinate (TOPROL-XL) 25 MG 24 hr tablet Take 1 tablet (25 mg total) by mouth daily.  60 tablet  3  . nitroGLYCERIN (NITROSTAT) 0.4 MG SL tablet Place 1 tablet (0.4 mg total) under the tongue every 5 (five) minutes as needed.  25 tablet  3  . Omega-3 Fatty Acids (FISH OIL) 1200 MG CAPS Take 1 capsule by mouth daily.        . rosuvastatin (CRESTOR) 10 MG tablet Take 1 tablet (10 mg total) by mouth daily.  30 tablet  6  . sulfaSALAzine (AZULFIDINE) 500 MG tablet Take 2 tablets (1,000 mg total) by mouth 2 (two) times daily.  120 tablet  2  . Fexofenadine HCl (ALLEGRA ALLERGY PO) Take 1 tablet by mouth as needed.       No current facility-administered medications for this visit.    Review of Systems : See HPI for pertinent positives and negatives.  Physical Examination  Filed Vitals:   06/17/14 1057 06/17/14 1101  BP: 120/61 113/62  Pulse: 52 54  Resp:  16  Height:  5' 6.5" (1.689 m)  Weight:  219 lb (99.338 kg)  SpO2:  95%   Body mass index is 34.82 kg/(m^2).  General: WDWN female in NAD, morbidly obese.  GAIT: uses cane, antalgic.  Eyes: PERRLA  Pulmonary: no dyspnea, some Rales in bases, Negative rhonchi, & few scattered inspiratory wheezes.  Cardiac: regular Rhythm, no detected murmur.   VASCULAR EXAM  Carotid Bruits  Left  Right    Negative  Negative   Aorta is not palpable.  Radial pulses are 2+ palpable and equal.   LE Pulses  LEFT  RIGHT   POPLITEAL  not palpable  not palpable   POSTERIOR TIBIAL  not palpable  not palpable   DORSALIS PEDIS  ANTERIOR TIBIAL  palpable  palpable    Gastrointestinal: soft, nontender, BS WNL, no r/g, no palpated masses.  Musculoskeletal: Negative muscle atrophy/wasting. M/S 5/5 throughout except left lower extremity is 4/5,  Extremities without ischemic changes. Chronic venous stasis changes in lower legs with 1+ bilateral pitting edema, leathery skin, no  ulcerations.  Neurologic: A&O X 3; Appropriate Affect ; SENSATION ;normal;  Speech is normal  CN 2-12 intact except, Pain and light touch intact in extremities, Motor exam as listed above.    Non-Invasive Vascular Imaging CAROTID DUPLEX 06/17/2014   CEREBROVASCULAR DUPLEX EVALUATION    INDICATION: Carotid artery stenosis    PREVIOUS INTERVENTION(S): N/A    DUPLEX EXAM:     RIGHT  LEFT  Peak Systolic Velocities (cm/s) End Diastolic Velocities (cm/s) Plaque LOCATION Peak Systolic Velocities (cm/s) End Diastolic Velocities (cm/s) Plaque  127 16  CCA PROXIMAL 88 15   69/156 17/27 HT CCA MID 98 17 HT  130 18 HT CCA DISTAL 115 20 HT  121 9 HT ECA 114 11 HT  279 73 HT ICA PROXIMAL 224 51 HT  100 16  ICA MID 112 18   74 19  ICA DISTAL 75 16     4.04 ICA / CCA Ratio (PSV) 2.29  Antegrade Vertebral Flow Antegrade  295 Brachial Systolic Pressure (mmHg) 284  Triphasic Brachial Artery Waveforms Triphasic    Plaque Morphology:  HM = Homogeneous, HT = Heterogeneous, CP = Calcific Plaque, SP = Smooth Plaque, IP = Irregular Plaque     ADDITIONAL FINDINGS:     IMPRESSION: Right common carotid artery stenosis of at least 50% at the mid segment. Right internal carotid artery stenosis present in the 60%-79% range. Left internal carotid artery stenosis present in the 40%-59% range, however may be underestimated due to calcific plaque present making Doppler interrogation difficult.    Compared to the previous exam:  Essentially unchanged since previous study on 12/16/2013.     Assessment: Shelley [name] is a 73 y.o. female who presents with asymptomatic right common carotid artery stenosis of at least 50% at the mid segment. Right internal carotid artery stenosis present in the 60%-79% range. Left internal carotid artery stenosis present in the 40%-59%  range, however may be underestimated due to calcific plaque present making Doppler interrogation difficult. Essentially unchanged since previous study on 12/16/2013. Fortunately she does not have DM but unfortunately she continues to smoke and was counseled re this and given several free resources re smoking cessation.  Plan: Follow-up in 6 months with Carotid Duplex scan.   I discussed in depth with the patient the nature of atherosclerosis, and emphasized the importance of maximal medical management including strict control of blood pressure, blood glucose, and lipid levels, obtaining regular exercise, and cessation of smoking.  The patient is aware that without maximal medical management the underlying atherosclerotic disease process will progress, limiting the benefit of any interventions. The patient was given information about stroke prevention and what symptoms should prompt the patient to seek immediate medical care. Thank you for allowing [place] to participate in this patient's care.  Clemon Chambers, RN, MSN, FNP-C [facility] Office: [phone]  Clinic Physician: Bridgett Larsson  06/17/2014 11:24 AM

## 2014-07-18 ENCOUNTER — Telehealth: Payer: Self-pay | Admitting: *Deleted

## 2014-07-18 DIAGNOSIS — N281 Cyst of kidney, acquired: Secondary | ICD-10-CM

## 2014-07-18 NOTE — Telephone Encounter (Signed)
I have spoken to patient and have advised of time, date, location and prep instructions for abdominal ultrasound. She verbalizes understanding.

## 2014-07-18 NOTE — Telephone Encounter (Signed)
-----  Message from Shelley [name], [place] sent at 08/17/2013  9:27 AM EST ----- Patient needs repeat abdominal ultrasound around 07/2014 (f/u kidney cysts)... See ultrasound report from 08/16/13.Marland KitchenMarland KitchenMarland Kitchen

## 2014-07-18 NOTE — Telephone Encounter (Signed)
Patient is scheduled for abdominal ultrasound on Monday 08/22/14 @ 9 am. Left message for patient to call back.

## 2014-07-19 ENCOUNTER — Encounter: Payer: Self-pay | Admitting: Cardiology

## 2014-07-19 ENCOUNTER — Ambulatory Visit (INDEPENDENT_AMBULATORY_CARE_PROVIDER_SITE_OTHER): Payer: Medicare Other | Admitting: Cardiology

## 2014-07-19 VITALS — BP 120/50 | HR 64 | Ht 66.0 in | Wt 218.0 lb

## 2014-07-19 DIAGNOSIS — I2510 Atherosclerotic heart disease of native coronary artery without angina pectoris: Secondary | ICD-10-CM

## 2014-07-19 DIAGNOSIS — Z136 Encounter for screening for cardiovascular disorders: Secondary | ICD-10-CM

## 2014-07-19 DIAGNOSIS — I6523 Occlusion and stenosis of bilateral carotid arteries: Secondary | ICD-10-CM

## 2014-07-19 NOTE — Assessment & Plan Note (Signed)
Continues to follow with [facility], recent carotid Dopplers noted above. She continues on antiplatelet therapy and statin.

## 2014-07-19 NOTE — Patient Instructions (Signed)

## 2014-07-19 NOTE — Assessment & Plan Note (Signed)
As outlined above, we will continue medical therapy for now, consider follow-up Lexiscan Cardiolite around the time of her next visit for reassessment of ischemic burden.

## 2014-07-19 NOTE — Progress Notes (Signed)
Reason for visit: CAD, carotid artery disease  Clinical Summary Shelley [name] is a 73 y.o.female last seen in May. She presents for a routine visit. States that she feels fatigued and stressed. No specific angina symptoms. Chronically short of breath at NYHA class 2-3. She functions in her ADLs but otherwise has no other exercise plan. She continues on aspirin, Plavix, beta blocker, and statin. Cardiolite from October 2011 demonstrated evidence of probable mid to basal inferior scar without ischemia, LVEF 50%. She has been managed medically.  Recent follow-up with [facility] noted in October, 60-79% RICA stenosis, and 38-25% LICA stenosis.  Today we reviewed her medications and discussed the possibility of a follow-up ischemic evaluation since her last assessment was 4 years ago. For now she wanted to hold off until after the holidays at least.  Allergies  Allergen Reactions  . Mercaptopurine     REACTION: pancreatitis  . Sulfa Antibiotics   . Penicillins Rash    REACTION: rash    Current Outpatient Prescriptions  Medication Sig Dispense Refill  . aspirin 81 MG tablet Take 81 mg by mouth daily.      . Calcium 600-200 MG-UNIT per tablet Take 1 tablet by mouth daily.      . Cholecalciferol (VITAMIN D3) 1000 UNITS CAPS Take 1 capsule by mouth daily.    . clopidogrel (PLAVIX) 75 MG tablet Take 1 tablet (75 mg total) by mouth daily with breakfast. 30 tablet 6  . Fexofenadine HCl (ALLEGRA ALLERGY PO) Take 1 tablet by mouth as needed.    Marland Kitchen FLUoxetine (PROZAC) 40 MG capsule Take 40 mg by mouth daily.    . folic acid (FOLVITE) 1 MG tablet Take 1 tablet (1 mg total) by mouth daily. 100 tablet 1  . glycopyrrolate (ROBINUL) 2 MG tablet Take 1 tablet (2 mg total) by mouth 2 (two) times daily as needed. 60 tablet 2  . HYDROcodone-acetaminophen (LORCET) 10-650 MG per tablet Take 1 tablet by mouth every 12 (twelve) hours as needed.      . loratadine (CLARITIN) 10 MG tablet Take 10 mg by mouth as needed.       . metoprolol succinate (TOPROL-XL) 25 MG 24 hr tablet Take 1 tablet (25 mg total) by mouth daily. 60 tablet 3  . nitroGLYCERIN (NITROSTAT) 0.4 MG SL tablet Place 1 tablet (0.4 mg total) under the tongue every 5 (five) minutes as needed. 25 tablet 3  . Omega-3 Fatty Acids (FISH OIL) 1200 MG CAPS Take 1 capsule by mouth daily.      . rosuvastatin (CRESTOR) 10 MG tablet Take 1 tablet (10 mg total) by mouth daily. 30 tablet 6  . sulfaSALAzine (AZULFIDINE) 500 MG tablet Take 2 tablets (1,000 mg total) by mouth 2 (two) times daily. 120 tablet 2   No current facility-administered medications for this visit.    Past Medical History  Diagnosis Date  . COPD (chronic obstructive pulmonary disease)   . Osteoarthritis   . Sinus polyp   . Mixed hyperlipidemia   . Regional enteritis of large intestine   . Pancreatitis   . Rectal bleeding   . Hepatic steatosis   . History of oral aphthous ulcers   . Essential hypertension, benign   . TIA (transient ischemic attack)   . Hx of adenomatous colonic polyps   . Carotid artery disease     05-39% RICA and LICA 7/67 - Dr. Oneida Alar  . Coronary atherosclerosis of native coronary artery     BMS to RCA 2004  .  Myocardial infarction     IMI 10/04  . History of Salmonella gastroenteritis   . Cancer     Melanoma  . Anxiety   . Depression   . Cataract   . Osteoporosis   . Tubular adenoma of colon 08/2012  . Stroke 2000    mini  . DVT (deep venous thrombosis)     Social History Shelley [name] reports that she has been smoking Cigarettes.  She started smoking about 57 years ago. She has a 10 pack-year smoking history. She has never used smokeless tobacco. Shelley [name] reports that she does not drink alcohol.  Review of Systems Complete review of systems negative except as otherwise outlined in the clinical summary.  Physical Examination Filed Vitals:   07/19/14 1050  BP: 120/50  Pulse: 64   Filed Weights   07/19/14 1050  Weight: 218 lb (98.884 kg)     Comfortable in no acute distress.  HEENT: Conjuctivae and lids normal, oropharynx clear with moist mucosa.  Neck: Supple, no elevated JVP, soft right carotid bruit, no thyromegaly or tenderness.  Lungs: Somewhat prolonged expiratory phase with scattered rhonchi, no frank wheezing. Nonlabored at rest. Cor: RRR, no pathologic systolic murmurs. No S3 or rub.  Ext: Mild edema and venous stasis. Distal pulses 1-2+.  Skin: Warm and dry.  Musculoskeletal: No gross deformities.  Neuropsychiatric: Alert and oriented x3, affect appropriate.   Problem List and Plan   CORONARY ATHEROSCLEROSIS, NATIVE VESSEL As outlined above, we will continue medical therapy for now, consider follow-up Lexiscan Cardiolite around the time of her next visit for reassessment of ischemic burden.  Bilateral carotid artery occlusion Continues to follow with [facility], recent carotid Dopplers noted above. She continues on antiplatelet therapy and statin.    Satira Sark, M.D., F.A.C.C.

## 2014-08-22 ENCOUNTER — Ambulatory Visit (HOSPITAL_COMMUNITY)
Admission: RE | Admit: 2014-08-22 | Discharge: 2014-08-22 | Disposition: A | Discharge status: Home / Self Care | Payer: Medicare Other | Source: Ambulatory Visit | Attending: Internal Medicine | Admitting: Internal Medicine

## 2014-08-22 DIAGNOSIS — N281 Cyst of kidney, acquired: Secondary | ICD-10-CM | POA: Insufficient documentation

## 2014-08-24 ENCOUNTER — Other Ambulatory Visit: Payer: Self-pay | Admitting: Cardiology

## 2014-08-29 ENCOUNTER — Other Ambulatory Visit: Payer: Self-pay | Admitting: Internal Medicine

## 2014-09-30 ENCOUNTER — Other Ambulatory Visit: Payer: Self-pay | Admitting: Internal Medicine

## 2014-10-03 ENCOUNTER — Telehealth: Payer: Self-pay | Admitting: Internal Medicine

## 2014-10-03 MED ORDER — FOLIC ACID 1 MG PO TABS: 1.0000 mg | ORAL_TABLET | Freq: Every day | ORAL | Status: DC

## 2014-10-03 MED ORDER — SULFASALAZINE 500 MG PO TABS: 500.0000 mg | ORAL_TABLET | Freq: Two times a day (BID) | ORAL | Status: DC

## 2014-10-03 NOTE — Telephone Encounter (Signed)
Rx sent to [facility] for Sulfasalazine (Azulfidine) 500 mg, 2 tablets, bid, po and Folic Acid (Folvite), 1 mg, po, every day.

## 2014-11-09 ENCOUNTER — Encounter: Payer: Self-pay | Admitting: Internal Medicine

## 2014-11-09 ENCOUNTER — Ambulatory Visit (INDEPENDENT_AMBULATORY_CARE_PROVIDER_SITE_OTHER): Payer: Medicare Other | Admitting: Internal Medicine

## 2014-11-09 ENCOUNTER — Other Ambulatory Visit (INDEPENDENT_AMBULATORY_CARE_PROVIDER_SITE_OTHER): Payer: Medicare Other

## 2014-11-09 VITALS — BP 130/58 | HR 60 | Ht 66.0 in | Wt 220.1 lb

## 2014-11-09 DIAGNOSIS — K5090 Crohn's disease, unspecified, without complications: Secondary | ICD-10-CM

## 2014-11-09 LAB — IBC PANEL
Iron: 71 ug/dL (ref 42–145)
Saturation Ratios: 19.7 % — ABNORMAL LOW (ref 20.0–50.0)
Transferrin: 258 mg/dL (ref 212.0–360.0)

## 2014-11-09 LAB — CBC WITH DIFFERENTIAL/PLATELET
BASOS ABS: 0 10*3/uL (ref 0.0–0.1)
Basophils Relative: 0.7 % (ref 0.0–3.0)
Eosinophils Absolute: 0.2 10*3/uL (ref 0.0–0.7)
Eosinophils Relative: 3.4 % (ref 0.0–5.0)
HEMATOCRIT: 38.5 % (ref 36.0–46.0)
HEMOGLOBIN: 13.1 g/dL (ref 12.0–15.0)
LYMPHS PCT: 22.3 % (ref 12.0–46.0)
Lymphs Abs: 1.5 10*3/uL (ref 0.7–4.0)
MCHC: 34 g/dL (ref 30.0–36.0)
MCV: 93.1 fl (ref 78.0–100.0)
MONO ABS: 0.6 10*3/uL (ref 0.1–1.0)
MONOS PCT: 8.9 % (ref 3.0–12.0)
NEUTROS ABS: 4.3 10*3/uL (ref 1.4–7.7)
Neutrophils Relative %: 64.7 % (ref 43.0–77.0)
PLATELETS: 236 10*3/uL (ref 150.0–400.0)
RBC: 4.14 Mil/uL (ref 3.87–5.11)
RDW: 14.6 % (ref 11.5–15.5)
WBC: 6.6 10*3/uL (ref 4.0–10.5)

## 2014-11-09 LAB — HEPATIC FUNCTION PANEL
ALK PHOS: 68 U/L (ref 39–117)
ALT: 9 U/L (ref 0–35)
AST: 12 U/L (ref 0–37)
Albumin: 4.1 g/dL (ref 3.5–5.2)
BILIRUBIN DIRECT: 0.1 mg/dL (ref 0.0–0.3)
TOTAL PROTEIN: 6.8 g/dL (ref 6.0–8.3)
Total Bilirubin: 0.5 mg/dL (ref 0.2–1.2)

## 2014-11-09 LAB — VITAMIN B12: Vitamin B-12: 231 pg/mL (ref 211–911)

## 2014-11-09 MED ORDER — SULFASALAZINE 500 MG PO TABS: 500.0000 mg | ORAL_TABLET | Freq: Two times a day (BID) | ORAL | Status: DC

## 2014-11-09 MED ORDER — DICYCLOMINE HCL 10 MG PO CAPS: 10.0000 mg | ORAL_CAPSULE | ORAL | Status: DC | PRN

## 2014-11-09 MED ORDER — ALPRAZOLAM 0.25 MG PO TABS: 0.2500 mg | ORAL_TABLET | Freq: Every evening | ORAL | Status: DC | PRN

## 2014-11-09 NOTE — Patient Instructions (Addendum)
We have sent the following medications to your pharmacy for you to pick up at your convenience:  Sulfasalazine, Xanax, Bentyl  Your physician has requested that you go to the basement for lab work before leaving today  Drr [address]

## 2014-11-09 NOTE — Progress Notes (Signed)
Shelley [name] 1940-10-31 [id number]  Note: This dictation was prepared with Dragon digital system. Any transcriptional errors that result from this procedure are unintentional.   History of Present Illness: This is a 74 year old white female with Crohn's colitis since 1998. Allergic allergic to 6-MP. Currently under good control with sulfasalazine 500 mg 2 tablets twice a day and folic acid 1 mg daily. She denies diarrhea. or ectal bleeding. Last colonoscopy in January 2014 and prior to that in 1998, 2003 and 2008 showed active colitis in the left colon and tubular adenoma which was removed. She also had tubular adenoma in 2008. She has a history of B12 deficiency and is due to have her B12 level rechecked. Patient has fatty liver but normal liver function test and normal upper abdominal ultrasound. She brought with her lab work from Dr. Scotty Court which includes metabolic panel and lipid profile which are normal    Past Medical History  Diagnosis Date  . COPD (chronic obstructive pulmonary disease)   . Osteoarthritis   . Sinus polyp   . Mixed hyperlipidemia   . Regional enteritis of large intestine   . Pancreatitis   . Rectal bleeding   . Hepatic steatosis   . History of oral aphthous ulcers   . Essential hypertension, benign   . TIA (transient ischemic attack)   . Hx of adenomatous colonic polyps   . Carotid artery disease     97-94% RICA and LICA 8/01 - Dr. Oneida Alar  . Coronary atherosclerosis of native coronary artery     BMS to RCA 2004  . Myocardial infarction     IMI 10/04  . History of Salmonella gastroenteritis   . Cancer     Melanoma  . Anxiety   . Depression   . Cataract   . Osteoporosis   . Tubular adenoma of colon 08/2012  . Stroke 2000    mini  . DVT (deep venous thrombosis)     Past Surgical History  Procedure Laterality Date  . Right total knee arthroplasty  2/12  . Melanoma resection  1996  . Cataract extraction      left eye  . Eye surgery    . Joint  replacement  Feb 2012    Right Knee    Allergies  Allergen Reactions  . Mercaptopurine     REACTION: pancreatitis  . Sulfa Antibiotics   . Penicillins Rash    REACTION: rash    Family history and social history have been reviewed.  Review of Systems: Denies nausea vomiting weight loss  The remainder of the 10 point ROS is negative except as outlined in the H&P  Physical Exam: General Appearance Well developed, in no distress, overweight Eyes  Non icteric  HEENT  Non traumatic, normocephalic  Mouth No lesion, tongue papillated, no cheilosis Neck Supple without adenopathy, thyroid not enlarged, no carotid bruits, no JVD Lungs Clear to auscultation bilaterally COR Normal S1, normal S2, regular rhythm, no murmur, quiet precordium Abdomen obese. Soft. Normoactive bowel sounds. No tenderness. Rectal not done Extremities  trace pedal edema Skin No lesions, large hematoma left eyelid sub- orbital secondary to cat scratch Neurological Alert and oriented x 3 Psychological Normal mood and affect  Assessment and Plan:   Large left periorbital hematoma due to cat stretch which happened yesterday. Does not look infected or suspicious. Vision   not involved. She will continue to put ice packs on it . Crohn's colitis under good control with Azulfidine 2 g daily and folic acid  1 mg daily. We will refill all medications diet he relates to stress. Unable to sleep at night we'll refill Xanax 0.25 mg to take at bedtime as needed Check B12 level today and iron studies I will see her in one year       Delfin Edis 11/09/2014

## 2014-11-11 ENCOUNTER — Other Ambulatory Visit: Payer: Self-pay | Admitting: *Deleted

## 2014-11-11 DIAGNOSIS — E538 Deficiency of other specified B group vitamins: Secondary | ICD-10-CM

## 2014-11-11 MED ORDER — CYANOCOBALAMIN 1000 MCG/ML IJ SOLN: 1000.0000 ug | INTRAMUSCULAR | Status: DC

## 2014-11-28 ENCOUNTER — Other Ambulatory Visit: Payer: Self-pay | Admitting: Cardiology

## 2014-11-28 ENCOUNTER — Telehealth: Payer: Self-pay | Admitting: Internal Medicine

## 2014-11-28 MED ORDER — SULFASALAZINE 500 MG PO TABS: 500.0000 mg | ORAL_TABLET | Freq: Two times a day (BID) | ORAL | Status: DC

## 2014-11-28 NOTE — Telephone Encounter (Signed)
Sent Rx for sulfasalazine (AZULFIDINE), 500 mg, to [facility] on 11/28/14. The original Rx for azulfidine was not correct. Re-sent Rx today with correct way to take medication.

## 2014-12-21 ENCOUNTER — Encounter: Payer: Self-pay | Admitting: Family

## 2014-12-22 ENCOUNTER — Encounter: Payer: Self-pay | Admitting: Family

## 2014-12-22 ENCOUNTER — Ambulatory Visit (HOSPITAL_COMMUNITY)
Admission: RE | Admit: 2014-12-22 | Discharge: 2014-12-22 | Disposition: A | Discharge status: Home / Self Care | Payer: Medicare Other | Source: Ambulatory Visit | Attending: Family | Admitting: Family

## 2014-12-22 ENCOUNTER — Ambulatory Visit (INDEPENDENT_AMBULATORY_CARE_PROVIDER_SITE_OTHER): Payer: Medicare Other | Admitting: Family

## 2014-12-22 VITALS — BP 114/59 | HR 60 | Resp 16 | Ht 67.0 in | Wt 219.0 lb

## 2014-12-22 DIAGNOSIS — Z720 Tobacco use: Secondary | ICD-10-CM

## 2014-12-22 DIAGNOSIS — I6523 Occlusion and stenosis of bilateral carotid arteries: Secondary | ICD-10-CM | POA: Diagnosis not present

## 2014-12-22 DIAGNOSIS — F17200 Nicotine dependence, unspecified, uncomplicated: Secondary | ICD-10-CM

## 2014-12-22 NOTE — Patient Instructions (Signed)
Stroke Prevention Some medical conditions and behaviors are associated with an increased chance of having a stroke. You may prevent a stroke by making healthy choices and managing medical conditions. HOW CAN I REDUCE MY RISK OF HAVING A STROKE?   Stay physically active. Get at least 30 minutes of activity on most or all days.  Do not smoke. It may also be helpful to avoid exposure to secondhand smoke.  Limit alcohol use. Moderate alcohol use is considered to be:  No more than 2 drinks per day for men.  No more than 1 drink per day for nonpregnant women.  Eat healthy foods. This involves:  Eating 5 or more servings of fruits and vegetables a day.  Making dietary changes that address high blood pressure (hypertension), high cholesterol, diabetes, or obesity.  Manage your cholesterol levels.  Making food choices that are high in fiber and low in saturated fat, trans fat, and cholesterol may control cholesterol levels.  Take any prescribed medicines to control cholesterol as directed by your health care provider.  Manage your diabetes.  Controlling your carbohydrate and sugar intake is recommended to manage diabetes.  Take any prescribed medicines to control diabetes as directed by your health care provider.  Control your hypertension.  Making food choices that are low in salt (sodium), saturated fat, trans fat, and cholesterol is recommended to manage hypertension.  Take any prescribed medicines to control hypertension as directed by your health care provider.  Maintain a healthy weight.  Reducing calorie intake and making food choices that are low in sodium, saturated fat, trans fat, and cholesterol are recommended to manage weight.  Stop drug abuse.  Avoid taking birth control pills.  Talk to your health care provider about the risks of taking birth control pills if you are over 35 years old, smoke, get migraines, or have ever had a blood clot.  Get evaluated for sleep  disorders (sleep apnea).  Talk to your health care provider about getting a sleep evaluation if you snore a lot or have excessive sleepiness.  Take medicines only as directed by your health care provider.  For some people, aspirin or blood thinners (anticoagulants) are helpful in reducing the risk of forming abnormal blood clots that can lead to stroke. If you have the irregular heart rhythm of atrial fibrillation, you should be on a blood thinner unless there is a good reason you cannot take them.  Understand all your medicine instructions.  Make sure that other conditions (such as anemia or atherosclerosis) are addressed. SEEK IMMEDIATE MEDICAL CARE IF:   You have sudden weakness or numbness of the face, arm, or leg, especially on one side of the body.  Your face or eyelid droops to one side.  You have sudden confusion.  You have trouble speaking (aphasia) or understanding.  You have sudden trouble seeing in one or both eyes.  You have sudden trouble walking.  You have dizziness.  You have a loss of balance or coordination.  You have a sudden, severe headache with no known cause.  You have new chest pain or an irregular heartbeat. Any of these symptoms may represent a serious problem that is an emergency. Do not wait to see if the symptoms will go away. Get medical help at once. Call your local emergency services (911 in [place] Do not drive yourself to the hospital. Document Released: 09/19/2004 Document Revised: 12/27/2013 Document Reviewed: 02/12/2013 [employer] Patient Information 2015 [employer]. This information is not intended to replace advice given   to you by your health care provider. Make sure you discuss any questions you have with your health care provider.    Smoking Cessation Quitting smoking is important to your health and has many advantages. However, it is not always easy to quit since nicotine is a very addictive drug. Oftentimes, people try 3 times or  more before being able to quit. This document explains the best ways for you to prepare to quit smoking. Quitting takes hard work and a lot of effort, but you can do it. ADVANTAGES OF QUITTING SMOKING  You will live longer, feel better, and live better.  Your body will feel the impact of quitting smoking almost immediately.  Within 20 minutes, blood pressure decreases. Your pulse returns to its normal level.  After 8 hours, carbon monoxide levels in the blood return to normal. Your oxygen level increases.  After 24 hours, the chance of having a heart attack starts to decrease. Your breath, hair, and body stop smelling like smoke.  After 48 hours, damaged nerve endings begin to recover. Your sense of taste and smell improve.  After 72 hours, the body is virtually free of nicotine. Your bronchial tubes relax and breathing becomes easier.  After 2 to 12 weeks, lungs can hold more air. Exercise becomes easier and circulation improves.  The risk of having a heart attack, stroke, cancer, or lung disease is greatly reduced.  After 1 year, the risk of coronary heart disease is cut in half.  After 5 years, the risk of stroke falls to the same as a nonsmoker.  After 10 years, the risk of lung cancer is cut in half and the risk of other cancers decreases significantly.  After 15 years, the risk of coronary heart disease drops, usually to the level of a nonsmoker.  If you are pregnant, quitting smoking will improve your chances of having a healthy baby.  The people you live with, especially any children, will be healthier.  You will have extra money to spend on things other than cigarettes. QUESTIONS TO THINK ABOUT BEFORE ATTEMPTING TO QUIT You may want to talk about your answers with your health care provider.  Why do you want to quit?  If you tried to quit in the past, what helped and what did not?  What will be the most difficult situations for you after you quit? How will you plan to  handle them?  Who can help you through the tough times? Your family? Friends? A health care provider?  What pleasures do you get from smoking? What ways can you still get pleasure if you quit? Here are some questions to ask your health care provider:  How can you help me to be successful at quitting?  What medicine do you think would be best for me and how should I take it?  What should I do if I need more help?  What is smoking withdrawal like? How can I get information on withdrawal? GET READY  Set a quit date.  Change your environment by getting rid of all cigarettes, ashtrays, matches, and lighters in your home, car, or work. Do not let people smoke in your home.  Review your past attempts to quit. Think about what worked and what did not. GET SUPPORT AND ENCOURAGEMENT You have a better chance of being successful if you have help. You can get support in many ways.  Tell your family, friends, and coworkers that you are going to quit and need their support. Ask them  not to smoke around you.  Get individual, group, or telephone counseling and support. Programs are available at [employer] and health centers. Call your local health department for information about programs in your area.  Spiritual beliefs and practices may help some smokers quit.  Download a "quit meter" on your computer to keep track of quit statistics, such as how long you have gone without smoking, cigarettes not smoked, and money saved.  Get a self-help book about quitting smoking and staying off tobacco. [place] yourself from urges to smoke. Talk to someone, go for a walk, or occupy your time with a task.  Change your normal routine. Take a different route to work. Drink tea instead of coffee. Eat breakfast in a different place.  Reduce your stress. Take a hot bath, exercise, or read a book.  Plan something enjoyable to do every day. Reward yourself for not  smoking.  Explore interactive web-based programs that specialize in helping you quit. GET MEDICINE AND USE IT CORRECTLY Medicines can help you stop smoking and decrease the urge to smoke. Combining medicine with the above behavioral methods and support can greatly increase your chances of successfully quitting smoking.  Nicotine replacement therapy helps deliver nicotine to your body without the negative effects and risks of smoking. Nicotine replacement therapy includes nicotine gum, lozenges, inhalers, nasal sprays, and skin patches. Some may be available over-the-counter and others require a prescription.  Antidepressant medicine helps people abstain from smoking, but how this works is unknown. This medicine is available by prescription.  Nicotinic receptor partial agonist medicine simulates the effect of nicotine in your brain. This medicine is available by prescription. Ask your health care provider for advice about which medicines to use and how to use them based on your health history. Your health care provider will tell you what side effects to look out for if you choose to be on a medicine or therapy. Carefully read the information on the package. Do not use any other product containing nicotine while using a nicotine replacement product.  RELAPSE OR DIFFICULT SITUATIONS Most relapses occur within the first 3 months after quitting. Do not be discouraged if you start smoking again. Remember, most people try several times before finally quitting. You may have symptoms of withdrawal because your body is used to nicotine. You may crave cigarettes, be irritable, feel very hungry, cough often, get headaches, or have difficulty concentrating. The withdrawal symptoms are only temporary. They are strongest when you first quit, but they will go away within 10-14 days. To reduce the chances of relapse, try to:  Avoid drinking alcohol. Drinking lowers your chances of successfully quitting.  Reduce the  amount of caffeine you consume. Once you quit smoking, the amount of caffeine in your body increases and can give you symptoms, such as a rapid heartbeat, sweating, and anxiety.  Avoid smokers because they can make you want to smoke.  Do not let weight gain distract you. Many smokers will gain weight when they quit, usually less than 10 pounds. Eat a healthy diet and stay active. You can always lose the weight gained after you quit.  Find ways to improve your mood other than smoking. FOR MORE INFORMATION  www.smokefree.gov  Document Released: 08/06/2001 Document Revised: 12/27/2013 Document Reviewed: 11/21/2011 [facility] Patient Information 2015 [place], [place]. This information is not intended to replace advice given to you by your health care provider. Make sure you discuss any questions you have with your health  care provider.    Smoking Cessation, Tips for Success If you are ready to quit smoking, congratulations! You have chosen to help yourself be healthier. Cigarettes bring nicotine, tar, carbon monoxide, and other irritants into your body. Your lungs, heart, and blood vessels will be able to work better without these poisons. There are many different ways to quit smoking. Nicotine gum, nicotine patches, a nicotine inhaler, or nicotine nasal spray can help with physical craving. Hypnosis, support groups, and medicines help break the habit of smoking. WHAT THINGS CAN I DO TO MAKE QUITTING EASIER?  Here are some tips to help you quit for good:  Pick a date when you will quit smoking completely. Tell all of your friends and family about your plan to quit on that date.  Do not try to slowly cut down on the number of cigarettes you are smoking. Pick a quit date and quit smoking completely starting on that day.  Throw away all cigarettes.   Clean and remove all ashtrays from your home, work, and car.  On a card, write down your reasons for quitting. Carry the card with you and read it  when you get the urge to smoke.  Cleanse your body of nicotine. Drink enough water and fluids to keep your urine clear or pale yellow. Do this after quitting to flush the nicotine from your body.  Learn to predict your moods. Do not let a bad situation be your excuse to have a cigarette. Some situations in your life might tempt you into wanting a cigarette.  Never have "just one" cigarette. It leads to wanting another and another. Remind yourself of your decision to quit.  Change habits associated with smoking. If you smoked while driving or when feeling stressed, try other activities to replace smoking. Stand up when drinking your coffee. Brush your teeth after eating. Sit in a different chair when you read the paper. Avoid alcohol while trying to quit, and try to drink fewer caffeinated beverages. Alcohol and caffeine may urge you to smoke.  Avoid foods and drinks that can trigger a desire to smoke, such as sugary or spicy foods and alcohol.  Ask people who smoke not to smoke around you.  Have something planned to do right after eating or having a cup of coffee. For example, plan to take a walk or exercise.  Try a relaxation exercise to calm you down and decrease your stress. Remember, you may be tense and nervous for the first 2 weeks after you quit, but this will pass.  Find new activities to keep your hands busy. Play with a pen, coin, or rubber band. Doodle or draw things on paper.  Brush your teeth right after eating. This will help cut down on the craving for the taste of tobacco after meals. You can also try mouthwash.   Use oral substitutes in place of cigarettes. Try using lemon drops, carrots, cinnamon sticks, or chewing gum. Keep them handy so they are available when you have the urge to smoke.  When you have the urge to smoke, try deep breathing.  Designate your home as a nonsmoking area.  If you are a heavy smoker, ask your health care provider about a prescription for  nicotine chewing gum. It can ease your withdrawal from nicotine.  Reward yourself. Set aside the cigarette money you save and buy yourself something nice.  Look for support from others. Join a support group or smoking cessation program. Ask someone at home or at work  to help you with your plan to quit smoking.  Always ask yourself, "Do I need this cigarette or is this just a reflex?" Tell yourself, "Today, I choose not to smoke," or "I do not want to smoke." You are reminding yourself of your decision to quit.  Do not replace cigarette smoking with electronic cigarettes (commonly called e-cigarettes). The safety of e-cigarettes is unknown, and some may contain harmful chemicals.  If you relapse, do not give up! Plan ahead and think about what you will do the next time you get the urge to smoke. HOW WILL I FEEL WHEN I QUIT SMOKING? You may have symptoms of withdrawal because your body is used to nicotine (the addictive substance in cigarettes). You may crave cigarettes, be irritable, feel very hungry, cough often, get headaches, or have difficulty concentrating. The withdrawal symptoms are only temporary. They are strongest when you first quit but will go away within 10-14 days. When withdrawal symptoms occur, stay in control. Think about your reasons for quitting. Remind yourself that these are signs that your body is healing and getting used to being without cigarettes. Remember that withdrawal symptoms are easier to treat than the major diseases that smoking can cause.  Even after the withdrawal is over, expect periodic urges to smoke. However, these cravings are generally short lived and will go away whether you smoke or not. Do not smoke! WHAT RESOURCES ARE AVAILABLE TO HELP ME QUIT SMOKING? Your health care provider can direct you to community resources or hospitals for support, which may include:  Group support.  Education.  Hypnosis.  Therapy. Document Released: 05/10/2004 Document  Revised: 12/27/2013 Document Reviewed: 01/28/2013 [facility] Patient Information 2015 [place], [place]. This information is not intended to replace advice given to you by your health care provider. Make sure you discuss any questions you have with your health care provider.

## 2014-12-22 NOTE — Progress Notes (Signed)
Established Carotid Patient   History of Present Illness  Shelley [name] is a 74 y.o. female patient of Dr. Oneida Alar with a history of carotid artery stenosis and no history of carotid intervention.  She returns for surveillance.  The patient does have a history of MI and 2 cardiac stents placed in 2004 and TIA symptoms many years ago manifested as transient right arm weakness, but denies history of stroke.  She has had no further stroke or TIA symptoms.  The patient denies amaurosis fugax or monocular blindness. The patient denies facial drooping.  Pt. denies current hemiplegia. The patient denies receptive or expressive aphasia.  Patient denies non-healing wounds.  Patient reports New Medical or Surgical History: receiving left knee injections and wearing a left knee brace as measure to help severe left knee OA, does not want knee replacement.  She lives in a 3 story house and climbs stairs several times/day, but her ambulation is otherwise limited by arthritis and pain from lumbar spine problems: DDD, HNP, arthritis, left leg sciatica.   Pt Diabetic: No, she is checked every 6 months, last A1C was 5.5, per pt  Pt smoker: smoker (1/2 ppd x since high school, quit several times in these yrs)   Pt meds include:  Statin : Yes  Betablocker: Yes  ASA: Yes  Other anticoagulants/antiplatelets: Plavix   Past Medical History  Diagnosis Date  . COPD (chronic obstructive pulmonary disease)   . Osteoarthritis   . Sinus polyp   . Mixed hyperlipidemia   . Regional enteritis of large intestine   . Pancreatitis   . Rectal bleeding   . Hepatic steatosis   . History of oral aphthous ulcers   . Essential hypertension, benign   . TIA (transient ischemic attack)   . Hx of adenomatous colonic polyps   . Carotid artery disease     92-33% RICA and LICA 0/07 - Dr. Oneida Alar  . Coronary atherosclerosis of native coronary artery     BMS to RCA 2004  . Myocardial infarction     IMI 10/04  . History  of Salmonella gastroenteritis   . Cancer     Melanoma  . Anxiety   . Depression   . Cataract   . Osteoporosis   . Tubular adenoma of colon 08/2012  . Stroke 2000    mini  . DVT (deep venous thrombosis)     Social History History  Substance Use Topics  . Smoking status: Light Tobacco Smoker -- 0.25 packs/day for 40 years    Types: Cigarettes    Start date: 06/02/1957  . Smokeless tobacco: Never Used     Comment: trying to quit  . Alcohol Use: No    Family History Family History  Problem Relation Age of Onset  . Diabetes type II Mother   . Heart attack Mother 60    questionable  . Uterine cancer Mother   . Diabetes Mother   . Heart disease Mother     before age 67  . Cancer Mother   . Varicose Veins Mother   . Colon cancer Neg Hx   . Other Brother     PVD and hx DVT  . Deep vein thrombosis Brother   . Breast cancer Sister   . Cancer Sister     Breast  . Hyperlipidemia Sister   . Hypertension Sister     Surgical History Past Surgical History  Procedure Laterality Date  . Right total knee arthroplasty  2/12  . Melanoma resection  1996  . Cataract extraction      left eye  . Eye surgery    . Joint replacement  Feb 2012    Right Knee    Allergies  Allergen Reactions  . Mercaptopurine     REACTION: pancreatitis  . Sulfa Antibiotics   . Penicillins Rash    REACTION: rash    Current Outpatient Prescriptions  Medication Sig Dispense Refill  . ALPRAZolam (XANAX) 0.25 MG tablet Take 1 tablet (0.25 mg total) by mouth at bedtime as needed for anxiety. 30 tablet 3  . aspirin 81 MG tablet Take 81 mg by mouth daily.      . Calcium 600-200 MG-UNIT per tablet Take 1 tablet by mouth daily.      . Cholecalciferol (VITAMIN D3) 1000 UNITS CAPS Take 1 capsule by mouth daily.    . clopidogrel (PLAVIX) 75 MG tablet TAKE 1 TABLET BY MOUTH DAILY WITH BREAKFAST. 30 tablet 6  . cyanocobalamin (,VITAMIN B-12,) 1000 MCG/ML injection Inject 1 mL (1,000 mcg total) into the  muscle every 30 (thirty) days. 1 mL 6  . dicyclomine (BENTYL) 10 MG capsule Take 1 capsule (10 mg total) by mouth as needed for spasms. 30 capsule 3  . Fexofenadine HCl (ALLEGRA ALLERGY PO) Take 1 tablet by mouth as needed.    Marland Kitchen FLUoxetine (PROZAC) 40 MG capsule Take 40 mg by mouth daily.    . folic acid (FOLVITE) 1 MG tablet Take 1 tablet (1 mg total) by mouth daily. 100 tablet 1  . HYDROcodone-acetaminophen (LORCET) 10-650 MG per tablet Take 1 tablet by mouth every 12 (twelve) hours as needed.      . loratadine (CLARITIN) 10 MG tablet Take 10 mg by mouth as needed.     . metoprolol succinate (TOPROL-XL) 25 MG 24 hr tablet TAKE 1 TABLET BY MOUTH DAILY 60 tablet 3  . nitroGLYCERIN (NITROSTAT) 0.4 MG SL tablet Place 1 tablet (0.4 mg total) under the tongue every 5 (five) minutes as needed. 25 tablet 3  . Omega-3 Fatty Acids (FISH OIL) 1200 MG CAPS Take 1 capsule by mouth daily.      . rosuvastatin (CRESTOR) 10 MG tablet Take 1 tablet (10 mg total) by mouth daily. 30 tablet 6  . sulfaSALAzine (AZULFIDINE) 500 MG tablet Take 1 tablet (500 mg total) by mouth 2 (two) times daily. As of 11/28/14: PT IS TO TAKE: 2 tablets, by mouth, two times a day 120 tablet 3  . glycopyrrolate (ROBINUL) 2 MG tablet Take 1 tablet (2 mg total) by mouth 2 (two) times daily as needed. (Patient not taking: Reported on 12/22/2014) 60 tablet 2   No current facility-administered medications for this visit.    Review of Systems : See HPI for pertinent positives and negatives.  Physical Examination  Filed Vitals:   12/22/14 1116 12/22/14 1124  BP: 110/62 114/59  Pulse: 60 60  Resp:  16  Height:  _0  (1.702 m)  Weight:  219 lb (99.338 kg)  SpO2:  97%   Body mass index is 34.29 kg/(m^2).  General: WDWN obese female in NAD. GAIT: uses cane, antalgic.  Eyes: PERRLA  Pulmonary: no dyspnea, limited air movement, Negative rhonchi, no wheezes.  Cardiac: regular Rhythm, no detected murmur.  VASCULAR EXAM   Carotid Bruits  Left  Right    Negative  Negative   Aorta is not palpable.  Radial pulses are 2+ palpable and equal.   LE Pulses  LEFT  RIGHT   POPLITEAL  not  palpable  not palpable   POSTERIOR TIBIAL  not palpable  not palpable   DORSALIS PEDIS  ANTERIOR TIBIAL  palpable  palpable    Gastrointestinal: soft, nontender, BS WNL, no r/g, no palpated masses.  Musculoskeletal: Negative muscle atrophy/wasting. M/S 5/5 throughout except left lower extremity is 4/5, Extremities without ischemic changes. Chronic venous stasis changes in lower legs with 1+ bilateral pitting edema, leathery skin, no ulcerations.  Neurologic: A&O X 3; Appropriate Affect, Speech is normal  CN 2-12 intact except, Pain and light touch intact in extremities, Motor exam as listed above.          Non-Invasive Vascular Imaging CAROTID DUPLEX 12/22/2014   CEREBROVASCULAR DUPLEX EVALUATION    INDICATION: Carotid disease    PREVIOUS INTERVENTION(S):     DUPLEX EXAM:     RIGHT  LEFT  Peak Systolic Velocities (cm/s) End Diastolic Velocities (cm/s) Plaque LOCATION Peak Systolic Velocities (cm/s) End Diastolic Velocities (cm/s) Plaque  107 20 HT CCA PROXIMAL 81 18   120 29 CP CCA MID 90 22 HT  59/125 16/28 HT CCA DISTAL 113 23 HT  112 14 CP ECA 108 7 CP  238 76 CP ICA PROXIMAL 182 51 CP  78 15  ICA MID 96 26   66 25  ICA DISTAL 74 21     Not Calculated ICA / CCA Ratio (PSV) 1.6  Antegrade Vertebral Flow Antegrade   Brachial Systolic Pressure (mmHg)   Multiphasic (subclavian artery) Brachial Artery Waveforms Multiphasic (subclavian artery)    Plaque Morphology:  HM = Homogeneous, HT = Heterogeneous, CP = Calcific Plaque, SP = Smooth Plaque, IP = Irregular Plaque     ADDITIONAL FINDINGS: No significant stenosis of the bilateral external or left common carotid arteries.    IMPRESSION: 1. Doppler velocities suggest a 60-79% stenosis of the right proximal internal carotid artery and a  borderline greater than 50% stenosis of the right mid common carotid artery. 2. Doppler velocities suggest a 40-59% stenosis of the left proximal internal carotid artery.    Compared to the previous exam:  No significant change noted when compared to the previous exam on 06/17/14.       Assessment: JOHNATHON MITTAL is a 74 y.o. female who had TIA symptoms many years ago manifested as transient right arm weakness, but denies history of stroke.  She has had no further stroke or TIA symptoms.  Today's carotid Duplex suggests  60-79% stenosis of the right proximal internal carotid artery and a borderline greater than 50% stenosis of the right mid common carotid artery, and  40-59% stenosis of the left proximal internal carotid artery. No significant change noted when compared to the previous exam on 06/17/14.  Her atherosclerotic risk factors include current tobacco use and obesity.   Plan: Follow-up in 6 months with Carotid Duplex.  The patient was counseled re smoking cessation and given several free resources re smoking cessation.   I discussed in depth with the patient the nature of atherosclerosis, and emphasized the importance of maximal medical management including strict control of blood pressure, blood glucose, and lipid levels, obtaining regular exercise, and continued cessation of smoking.  The patient is aware that without maximal medical management the underlying atherosclerotic disease process will progress, limiting the benefit of any interventions. The patient was given information about stroke prevention and what symptoms should prompt the patient to seek immediate medical care. Thank you for allowing [place] to participate in this patient's care.  Vinnie Level Nickel, RN, MSN, FNP-C  [facility] Office: [facility] Physician: Oneida Alar  12/22/2014 11:39 AM

## 2015-01-24 ENCOUNTER — Encounter: Payer: Self-pay | Admitting: Cardiology

## 2015-01-24 ENCOUNTER — Ambulatory Visit (INDEPENDENT_AMBULATORY_CARE_PROVIDER_SITE_OTHER): Payer: Medicare Other | Admitting: Cardiology

## 2015-01-24 VITALS — BP 114/66 | HR 61 | Ht 66.0 in | Wt 219.4 lb

## 2015-01-24 DIAGNOSIS — I6523 Occlusion and stenosis of bilateral carotid arteries: Secondary | ICD-10-CM

## 2015-01-24 DIAGNOSIS — I2510 Atherosclerotic heart disease of native coronary artery without angina pectoris: Secondary | ICD-10-CM | POA: Diagnosis not present

## 2015-01-24 DIAGNOSIS — E782 Mixed hyperlipidemia: Secondary | ICD-10-CM

## 2015-01-24 MED ORDER — NITROGLYCERIN 0.4 MG SL SUBL: 0.4000 mg | SUBLINGUAL_TABLET | SUBLINGUAL | Status: DC | PRN

## 2015-01-24 NOTE — Patient Instructions (Signed)
Your physician recommends that you continue on your current medications as directed. Please refer to the Current Medication list given to you today. Your physician recommends that you schedule a follow-up appointment in: 6 months. You will receive a reminder letter in the mail in about 4 months reminding you to call and schedule your appointment. If you don't receive this letter, please contact our office.  Please contact our office to set up your stress test when you are ready. (stress test information will be gotten from [place] at that time.)

## 2015-01-24 NOTE — Progress Notes (Signed)
Cardiology Office Note  Date: 01/24/2015   ID: Shelley [name], DOB 28-Oct-1940, MRN [id number]  PCP: Deloria Lair, MD  Primary Cardiologist: Rozann Lesches, MD   Chief Complaint  Patient presents with  . Coronary Artery Disease    History of Present Illness: Shelley [name] is a 74 y.o. female last seen in November 2015. She presents for a routine follow-up visit. She has been under a lot of stress in her family recently. She reports no overall health change other than a fall since I last saw her, she tripped when walking and cut her head, no reported major injury however.  Follow-up with [facility] noted in April, 60-79% RICA stenosis, and 47-65% LICA stenosis.  Cardiolite from October 2011 demonstrated evidence of probable mid to basal inferior scar without ischemia, LVEF 50%. She has been managed medically. We have discussed follow-up ischemic testing to reevaluate ischemic burden.  Follow-up lab work per Dr. Scotty Court is reviewed below.  Past Medical History  Diagnosis Date  . COPD (chronic obstructive pulmonary disease)   . Osteoarthritis   . Sinus polyp   . Mixed hyperlipidemia   . Regional enteritis of large intestine   . Pancreatitis   . Rectal bleeding   . Hepatic steatosis   . History of oral aphthous ulcers   . Essential hypertension, benign   . TIA (transient ischemic attack)   . Hx of adenomatous colonic polyps   . Carotid artery disease     46-50% RICA and LICA 3/54 - Dr. Oneida Alar  . Coronary atherosclerosis of native coronary artery     BMS to RCA 2004  . Myocardial infarction     IMI 10/04  . History of Salmonella gastroenteritis   . Cancer     Melanoma  . Anxiety   . Depression   . Cataract   . Osteoporosis   . Tubular adenoma of colon 08/2012  . Stroke 2000    mini  . DVT (deep venous thrombosis)     Past Surgical History  Procedure Laterality Date  . Right total knee arthroplasty  2/12  . Melanoma resection  1996  . Cataract extraction     left eye  . Eye surgery    . Joint replacement  Feb 2012    Right Knee    Current Outpatient Prescriptions  Medication Sig Dispense Refill  . ALPRAZolam (XANAX) 0.25 MG tablet Take 1 tablet (0.25 mg total) by mouth at bedtime as needed for anxiety. 30 tablet 3  . aspirin 81 MG tablet Take 81 mg by mouth daily.      . Calcium 600-200 MG-UNIT per tablet Take 1 tablet by mouth daily.      . Cholecalciferol (VITAMIN D3) 1000 UNITS CAPS Take 1 capsule by mouth daily.    . clopidogrel (PLAVIX) 75 MG tablet TAKE 1 TABLET BY MOUTH DAILY WITH BREAKFAST. 30 tablet 6  . cyanocobalamin (,VITAMIN B-12,) 1000 MCG/ML injection Inject 1 mL (1,000 mcg total) into the muscle every 30 (thirty) days. 1 mL 6  . dicyclomine (BENTYL) 10 MG capsule Take 1 capsule (10 mg total) by mouth as needed for spasms. 30 capsule 3  . Fexofenadine HCl (ALLEGRA ALLERGY PO) Take 1 tablet by mouth as needed.    Marland Kitchen FLUoxetine (PROZAC) 40 MG capsule Take 40 mg by mouth daily.    . folic acid (FOLVITE) 1 MG tablet Take 1 tablet (1 mg total) by mouth daily. 100 tablet 1  . glycopyrrolate (ROBINUL) 2 MG tablet  Take 1 tablet (2 mg total) by mouth 2 (two) times daily as needed. 60 tablet 2  . HYDROcodone-acetaminophen (LORCET) 10-650 MG per tablet Take 1 tablet by mouth every 12 (twelve) hours as needed.      . loratadine (CLARITIN) 10 MG tablet Take 10 mg by mouth as needed.     . metoprolol succinate (TOPROL-XL) 25 MG 24 hr tablet TAKE 1 TABLET BY MOUTH DAILY 60 tablet 3  . nitroGLYCERIN (NITROSTAT) 0.4 MG SL tablet Place 1 tablet (0.4 mg total) under the tongue every 5 (five) minutes x 3 doses as needed. 25 tablet 3  . Omega-3 Fatty Acids (FISH OIL) 1200 MG CAPS Take 1 capsule by mouth daily.      . rosuvastatin (CRESTOR) 10 MG tablet Take 1 tablet (10 mg total) by mouth daily. 30 tablet 6  . sulfaSALAzine (AZULFIDINE) 500 MG tablet Take 1 tablet (500 mg total) by mouth 2 (two) times daily. As of 11/28/14: PT IS TO TAKE: 2 tablets, by  mouth, two times a day 120 tablet 3   No current facility-administered medications for this visit.    Allergies:  Mercaptopurine; Sulfa antibiotics; and Penicillins   Social History: The patient  reports that she has been smoking Cigarettes.  She started smoking about 57 years ago. She has a 10 pack-year smoking history. She has never used smokeless tobacco. She reports that she does not drink alcohol or use illicit drugs.   ROS:  Please see the history of present illness. Otherwise, complete review of systems is positive for chronic venous stasis, knee pain.  All other systems are reviewed and negative.   Physical Exam: VS:  BP 114/66 mmHg  Pulse 61  Ht _0  (1.676 m)  Wt 219 lb 6.4 oz (99.519 kg)  BMI 35.43 kg/m2  SpO2 95%, BMI Body mass index is 35.43 kg/(m^2).  Wt Readings from Last 3 Encounters:  01/24/15 219 lb 6.4 oz (99.519 kg)  12/22/14 219 lb (99.338 kg)  11/09/14 220 lb 2 oz (99.848 kg)     Comfortable in no acute distress.  HEENT: Conjuctivae and lids normal, oropharynx clear with moist mucosa.  Neck: Supple, no elevated JVP, soft right carotid bruit, no thyromegaly or tenderness.  Lungs: Somewhat prolonged expiratory phase with scattered rhonchi, no frank wheezing. Nonlabored at rest. Cor: RRR, no pathologic systolic murmurs. No S3 or rub.  Ext: Mild edema and venous stasis. Distal pulses 1-2+.  Skin: Warm and dry.  Musculoskeletal: No gross deformities.  Neuropsychiatric: Alert and oriented x3, affect appropriate.   ECG: ECG is not ordered today.   Recent Labwork:  10/25/2014: BUN 10, creatinine 0.6, potassium 4.2, AST 12, ALT 18, cholesterol 132, HDL 74, LDL 38, triglycerides 101.   Assessment and Plan:  1. CAD status post BMS to the RCA in 2004. Last ischemic testing was in 2011 as outlined above. We have discussed arranging a follow-up Lexiscan Cardiolite, and she will plan to call us back at her convenience to schedule. Otherwise keep her regular  follow-up.  2. Hyperlipidemia, on Crestor, recent LDL 38.  3. Essential hypertension, blood pressure is normal today.  4. Carotid artery disease, keep follow-up with Dr. Oneida Alar.  Current medicines were reviewed with the patient today.   Disposition: FU with me in 6 months.   Signed, Satira Sark, MD, [facility] 01/24/2015 12:20 PM    [facility] [address], [place], [place] [address] Phone: [phone]; Fax: [phone]

## 2015-01-26 ENCOUNTER — Other Ambulatory Visit: Payer: Self-pay | Admitting: Cardiology

## 2015-03-27 ENCOUNTER — Other Ambulatory Visit: Payer: Self-pay | Admitting: Cardiology

## 2015-04-04 ENCOUNTER — Other Ambulatory Visit: Payer: Self-pay | Admitting: Internal Medicine

## 2015-04-27 ENCOUNTER — Telehealth: Payer: Self-pay | Admitting: Internal Medicine

## 2015-04-27 NOTE — Telephone Encounter (Signed)
Pt is sch'd w/Dr. Silverio Decamp for 06-28-15 @ 11:00

## 2015-04-27 NOTE — Telephone Encounter (Signed)
Patient called requesting a refill on Folic Acid. Patient needs to choose another physician since Dr. [name] retired. Then we will send request to that physician or the doc of the day.

## 2015-04-28 MED ORDER — FOLIC ACID 1 MG PO TABS: 1.0000 mg | ORAL_TABLET | Freq: Every day | ORAL | Status: DC

## 2015-04-28 NOTE — Telephone Encounter (Signed)
Ok' d per Dr. Hilarie Fredrickson. Folic Acid 1 mg #094 with 1 refill sent in to [facility].

## 2015-04-28 NOTE — Telephone Encounter (Signed)
Ok to refill folic acid

## 2015-04-28 NOTE — Telephone Encounter (Signed)
Pt is sch'd on 06-28-15 w/ Dr. Silverio Decamp and needs her Folic Acid Refilled Requesting CB once it has been sent in

## 2015-04-28 NOTE — Telephone Encounter (Signed)
Dr. Hilarie Fredrickson  Please advise. Your were the Doc of the Day when the call came in. Thanks.

## 2015-05-03 ENCOUNTER — Telehealth: Payer: Self-pay

## 2015-05-03 DIAGNOSIS — Z79899 Other long term (current) drug therapy: Secondary | ICD-10-CM

## 2015-05-03 NOTE — Telephone Encounter (Signed)
OK to do this x 2 months  She should have a CBC  Diagnosis is long-term use high risk medication and also needs her B12 level rechecked (already an order in there) re dx B12 deficiency  Make sure she is ok on folic acid rx also

## 2015-05-03 NOTE — Telephone Encounter (Signed)
Received fax requesting refill on sulfasalazine 579m.  She is a Dr. BOlevia Perchespatient so will forward to Doc of the day to advise, Dr. CSilvano Rusk  Patient has upcoming appointment with Dr NSilverio Decampon Nov. 2nd.

## 2015-05-04 MED ORDER — SULFASALAZINE 500 MG PO TABS: 1000.0000 mg | ORAL_TABLET | Freq: Two times a day (BID) | ORAL | Status: DC

## 2015-05-04 NOTE — Telephone Encounter (Signed)
Spoke with patient and informed her of labs needed.  She wishes to go to her PCP- Dr Scotty Court in [place] for this.  I spoke to [place] there and will fax the orders to them to draw.  Fax # [phone], ATT: Cindy  .  I refilled her sulfasalazine.  Patient has her folic acid, refilled recently.

## 2015-06-28 ENCOUNTER — Ambulatory Visit (INDEPENDENT_AMBULATORY_CARE_PROVIDER_SITE_OTHER): Payer: Medicare Other | Admitting: Gastroenterology

## 2015-06-28 ENCOUNTER — Encounter: Payer: Self-pay | Admitting: Gastroenterology

## 2015-06-28 VITALS — BP 130/60 | HR 64 | Ht 66.0 in | Wt 222.2 lb

## 2015-06-28 DIAGNOSIS — K5010 Crohn's disease of large intestine without complications: Secondary | ICD-10-CM

## 2015-06-28 MED ORDER — SULFASALAZINE 500 MG PO TABS: 1000.0000 mg | ORAL_TABLET | Freq: Two times a day (BID) | ORAL | Status: DC

## 2015-06-28 MED ORDER — FOLIC ACID 1 MG PO TABS: 1.0000 mg | ORAL_TABLET | Freq: Every day | ORAL | Status: DC

## 2015-06-28 NOTE — Patient Instructions (Signed)
Follow up in 1 year We will send your prescriptions to your pharmacy Stay on B12 as discussed 1 week on 1 week off

## 2015-06-29 NOTE — Progress Notes (Signed)
Shelley [name]    [id number]    1940/12/13  Primary Care Physician:TAPPER,DAVID B, MD  Referring Physician: Zella Richer. [address], [place], [place] [address]  Chief complaint:  Crohn's colitis HPI:  74 year old white female with Crohn's colitis since 1998. Allergic to 6-MP. Currently under good control with sulfasalazine 500 mg 2 tablets twice a day and folic acid 1 mg daily. She denies diarrhea or rectal bleeding. Last colonoscopy in January 2014 showed no active inflammation and tubular adenoma which was removed. She has a history of B12 deficiency in the past and most recent labs show B12 and CBC within normal limits based on lab work from Dr. [address] . Denies any nausea, vomiting, abdominal pain, melena or bright red blood per rectum   Outpatient Encounter Prescriptions as of 06/28/2015  Medication Sig  . ALPRAZolam (XANAX) 0.25 MG tablet Take 1 tablet (0.25 mg total) by mouth at bedtime as needed for anxiety.  Marland Kitchen aspirin 81 MG tablet Take 81 mg by mouth daily.    . Calcium 600-200 MG-UNIT per tablet Take 1 tablet by mouth daily.    . Cholecalciferol (VITAMIN D3) 1000 UNITS CAPS Take 1 capsule by mouth daily.  . clopidogrel (PLAVIX) 75 MG tablet TAKE 1 TABLET BY MOUTH EVERY DAY WITH BREAKFAST  . CRESTOR 10 MG tablet TAKE 1 TABLET BY MOUTH DAILY  . dicyclomine (BENTYL) 10 MG capsule Take 1 capsule (10 mg total) by mouth as needed for spasms.  Marland Kitchen Fexofenadine HCl (ALLEGRA ALLERGY PO) Take 1 tablet by mouth as needed.  Marland Kitchen FLUoxetine (PROZAC) 40 MG capsule Take 40 mg by mouth daily.  . folic acid (FOLVITE) 1 MG tablet Take 1 tablet (1 mg total) by mouth daily.  Marland Kitchen HYDROcodone-acetaminophen (LORCET) 10-650 MG per tablet Take 1 tablet by mouth every 12 (twelve) hours as needed.    . metoprolol succinate (TOPROL-XL) 25 MG 24 hr tablet TAKE 1 TABLET BY MOUTH DAILY  . nitroGLYCERIN (NITROSTAT) 0.4 MG SL tablet Place 1 tablet (0.4 mg total) under the tongue every 5  (five) minutes x 3 doses as needed.  . Omega-3 Fatty Acids (FISH OIL) 1200 MG CAPS Take 1 capsule by mouth daily.    Marland Kitchen sulfaSALAzine (AZULFIDINE) 500 MG tablet Take 2 tablets (1,000 mg total) by mouth 2 (two) times daily.  . [DISCONTINUED] folic acid (FOLVITE) 1 MG tablet Take 1 tablet (1 mg total) by mouth daily.  . [DISCONTINUED] sulfaSALAzine (AZULFIDINE) 500 MG tablet Take 2 tablets (1,000 mg total) by mouth 2 (two) times daily.  . [DISCONTINUED] cyanocobalamin (,VITAMIN B-12,) 1000 MCG/ML injection Inject 1 mL (1,000 mcg total) into the muscle every 30 (thirty) days.  . [DISCONTINUED] glycopyrrolate (ROBINUL) 2 MG tablet Take 1 tablet (2 mg total) by mouth 2 (two) times daily as needed.  . [DISCONTINUED] loratadine (CLARITIN) 10 MG tablet Take 10 mg by mouth as needed.    No facility-administered encounter medications on file as of 06/28/2015.    Allergies as of 06/28/2015 - Review Complete 06/28/2015  Allergen Reaction Noted  . Mercaptopurine  01/27/2008  . Sulfa antibiotics  01/10/2011  . Penicillins Rash     Past Medical History  Diagnosis Date  . COPD (chronic obstructive pulmonary disease) ([place])   . Osteoarthritis   . Sinus polyp   . Mixed hyperlipidemia   . Regional enteritis of large intestine ([place])   . Pancreatitis   . Rectal bleeding   .  Hepatic steatosis   . History of oral aphthous ulcers   . Essential hypertension, benign   . TIA (transient ischemic attack)   . Hx of adenomatous colonic polyps   . Carotid artery disease ([place])     32-76% RICA and LICA 1/47 - Dr. Oneida Alar  . Coronary atherosclerosis of native coronary artery     BMS to RCA 2004  . Myocardial infarction ([place])     IMI 10/04  . History of Salmonella gastroenteritis   . Cancer ([place])     Melanoma  . Anxiety   . Depression   . Cataract   . Osteoporosis   . Tubular adenoma of colon 08/2012  . Stroke ([place]) 2000    mini  . DVT (deep venous thrombosis) [facility])     Past Surgical History  Procedure  Laterality Date  . Right total knee arthroplasty  2/12  . Melanoma resection  1996  . Cataract extraction      left eye  . Eye surgery    . Joint replacement  Feb 2012    Right Knee    Family History  Problem Relation Age of Onset  . Diabetes type II Mother   . Heart attack Mother 73    questionable  . Uterine cancer Mother   . Diabetes Mother   . Heart disease Mother     before age 80  . Cancer Mother   . Varicose Veins Mother   . Colon cancer Neg Hx   . Other Brother     PVD and hx DVT  . Deep vein thrombosis Brother   . Breast cancer Sister   . Cancer Sister     Breast  . Hyperlipidemia Sister   . Hypertension Sister     Social History   Social History  . Marital Status: Divorced    Spouse Name: N/A  . Number of Children: 2  . Years of Education: N/A   Occupational History  . Retired   .     Social History Main Topics  . Smoking status: Light Tobacco Smoker -- 0.25 packs/day for 40 years    Types: Cigarettes    Start date: 06/02/1957  . Smokeless tobacco: Never Used     Comment: trying to quit 1/2 pack day - so much stress in her lift  . Alcohol Use: No  . Drug Use: No  . Sexual Activity: Not on file   Other Topics Concern  . Not on file   Social History Narrative      Review of systems: Review of Systems  Constitutional: Negative for fever and chills.  HENT: Negative.   Eyes: Negative for blurred vision.  Respiratory: Negative for cough, shortness of breath and wheezing.   Cardiovascular: Negative for chest pain and palpitations.  Gastrointestinal: as per HPI Genitourinary: Negative for dysuria, urgency, frequency and hematuria.  Musculoskeletal: Negative for myalgias, back pain and joint pain.  Skin: Negative for itching and rash.  Neurological: Negative for dizziness, tremors, focal weakness, seizures and loss of consciousness.  Endo/Heme/Allergies: Negative for environmental allergies.  Psychiatric/Behavioral: Negative for depression,  suicidal ideas and hallucinations.  All other systems reviewed and are negative.   Physical Exam: Filed Vitals:   06/28/15 1105  BP: 130/60  Pulse: 64   Gen:      No acute distress HEENT:  EOMI, sclera anicteric Neck:     No masses; no thyromegaly Lungs:    Clear to auscultation bilaterally; normal respiratory effort CV:  Regular rate and rhythm; no murmurs Abd:      + bowel sounds; soft, non-tender; no palpable masses, no distension Ext:    No edema; adequate peripheral perfusion Skin:      Warm and dry; no rash Neuro: alert and oriented x 3 Psych: normal mood and affect  Data Reviewed: 2014 1. Surgical [P], right colon, bx - BENIGN COLONIC MUCOSA. - NEGATIVE FOR DYSPLASIA. - NO ACTIVE INFLAMMATION. 2. Surgical [P], descending, bx - BENIGN COLONIC MUCOSA. - NEGATIVE FOR DYSPLASIA. - NO ACTIVE INFLAMMATION. 3. Surgical [P], sigmoid 0-30 cm, bx - BENIGN COLONIC MUCOSA. - NEGATIVE FOR DYSPLASIA. - NO ACTIVE INFLAMMATION. 4. Surgical [P], sigmoid at 20 cm, polyp - TUBULAR ADENOMA. - NO HIGH GRADE DYSPLASIA OR MALIGNANCY IDENTIFIED. - SEE MICROSCOPIC DESCRIPTION   Assessment and Plan/Recommendations: 46 yr F with h/o left sided crohn's colitis on sulfasalazine in remission is here for follow up Last colonoscopy in 2014 showed 1 tubular adenoma, she is due now for surveillance colonoscopy for dysplasia for the left sided colitis diagnosed >15 years but patient is reluctant to undergo colonoscopy at this time and would like to wait until 2019 for colonoscopy Continue Sulfasalazine and folic acid Return in 1 year  K. Denzil Magnuson , MD [phone] Mon-Fri 8a-5p [phone] after 5p, weekends, holidays

## 2015-07-03 ENCOUNTER — Encounter: Payer: Self-pay | Admitting: Family

## 2015-07-06 ENCOUNTER — Encounter: Payer: Self-pay | Admitting: Family

## 2015-07-06 ENCOUNTER — Ambulatory Visit (HOSPITAL_COMMUNITY)
Admission: RE | Admit: 2015-07-06 | Discharge: 2015-07-06 | Disposition: A | Discharge status: Home / Self Care | Payer: Medicare Other | Source: Ambulatory Visit | Attending: Family | Admitting: Family

## 2015-07-06 ENCOUNTER — Ambulatory Visit (INDEPENDENT_AMBULATORY_CARE_PROVIDER_SITE_OTHER): Payer: Medicare Other | Admitting: Family

## 2015-07-06 VITALS — BP 143/68 | HR 54 | Temp 97.0°F | Resp 14 | Ht 65.0 in | Wt 222.0 lb

## 2015-07-06 DIAGNOSIS — F17200 Nicotine dependence, unspecified, uncomplicated: Secondary | ICD-10-CM

## 2015-07-06 DIAGNOSIS — I6523 Occlusion and stenosis of bilateral carotid arteries: Secondary | ICD-10-CM

## 2015-07-06 DIAGNOSIS — Z720 Tobacco use: Secondary | ICD-10-CM

## 2015-07-06 NOTE — Patient Instructions (Signed)
Stroke Prevention Some medical conditions and behaviors are associated with an increased chance of having a stroke. You may prevent a stroke by making healthy choices and managing medical conditions. HOW CAN I REDUCE MY RISK OF HAVING A STROKE?   Stay physically active. Get at least 30 minutes of activity on most or all days.  Do not smoke. It may also be helpful to avoid exposure to secondhand smoke.  Limit alcohol use. Moderate alcohol use is considered to be:  No more than 2 drinks per day for men.  No more than 1 drink per day for nonpregnant women.  Eat healthy foods. This involves:  Eating 5 or more servings of fruits and vegetables a day.  Making dietary changes that address high blood pressure (hypertension), high cholesterol, diabetes, or obesity.  Manage your cholesterol levels.  Making food choices that are high in fiber and low in saturated fat, trans fat, and cholesterol may control cholesterol levels.  Take any prescribed medicines to control cholesterol as directed by your health care provider.  Manage your diabetes.  Controlling your carbohydrate and sugar intake is recommended to manage diabetes.  Take any prescribed medicines to control diabetes as directed by your health care provider.  Control your hypertension.  Making food choices that are low in salt (sodium), saturated fat, trans fat, and cholesterol is recommended to manage hypertension.  Ask your health care provider if you need treatment to lower your blood pressure. Take any prescribed medicines to control hypertension as directed by your health care provider.  If you are 18-39 years of age, have your blood pressure checked every 3-5 years. If you are 40 years of age or older, have your blood pressure checked every year.  Maintain a healthy weight.  Reducing calorie intake and making food choices that are low in sodium, saturated fat, trans fat, and cholesterol are recommended to manage  weight.  Stop drug abuse.  Avoid taking birth control pills.  Talk to your health care provider about the risks of taking birth control pills if you are over 35 years old, smoke, get migraines, or have ever had a blood clot.  Get evaluated for sleep disorders (sleep apnea).  Talk to your health care provider about getting a sleep evaluation if you snore a lot or have excessive sleepiness.  Take medicines only as directed by your health care provider.  For some people, aspirin or blood thinners (anticoagulants) are helpful in reducing the risk of forming abnormal blood clots that can lead to stroke. If you have the irregular heart rhythm of atrial fibrillation, you should be on a blood thinner unless there is a good reason you cannot take them.  Understand all your medicine instructions.  Make sure that other conditions (such as anemia or atherosclerosis) are addressed. SEEK IMMEDIATE MEDICAL CARE IF:   You have sudden weakness or numbness of the face, arm, or leg, especially on one side of the body.  Your face or eyelid droops to one side.  You have sudden confusion.  You have trouble speaking (aphasia) or understanding.  You have sudden trouble seeing in one or both eyes.  You have sudden trouble walking.  You have dizziness.  You have a loss of balance or coordination.  You have a sudden, severe headache with no known cause.  You have new chest pain or an irregular heartbeat. Any of these symptoms may represent a serious problem that is an emergency. Do not wait to see if the symptoms will   go away. Get medical help at once. Call your local emergency services (911 in [place] Do not drive yourself to the hospital.   This information is not intended to replace advice given to you by your health care provider. Make sure you discuss any questions you have with your health care provider.   Document Released: 09/19/2004 Document Revised: 09/02/2014 Document Reviewed:  02/12/2013 Elsevier Interactive Patient Education 2016 [employer].    Smoking Cessation, Tips for Success If you are ready to quit smoking, congratulations! You have chosen to help yourself be healthier. Cigarettes bring nicotine, tar, carbon monoxide, and other irritants into your body. Your lungs, heart, and blood vessels will be able to work better without these poisons. There are many different ways to quit smoking. Nicotine gum, nicotine patches, a nicotine inhaler, or nicotine nasal spray can help with physical craving. Hypnosis, support groups, and medicines help break the habit of smoking. WHAT THINGS CAN I DO TO MAKE QUITTING EASIER?  Here are some tips to help you quit for good:  Pick a date when you will quit smoking completely. Tell all of your friends and family about your plan to quit on that date.  Do not try to slowly cut down on the number of cigarettes you are smoking. Pick a quit date and quit smoking completely starting on that day.  Throw away all cigarettes.   Clean and remove all ashtrays from your home, work, and car.  On a card, write down your reasons for quitting. Carry the card with you and read it when you get the urge to smoke.  Cleanse your body of nicotine. Drink enough water and fluids to keep your urine clear or pale yellow. Do this after quitting to flush the nicotine from your body.  Learn to predict your moods. Do not let a bad situation be your excuse to have a cigarette. Some situations in your life might tempt you into wanting a cigarette.  Never have "just one" cigarette. It leads to wanting another and another. Remind yourself of your decision to quit.  Change habits associated with smoking. If you smoked while driving or when feeling stressed, try other activities to replace smoking. Stand up when drinking your coffee. Brush your teeth after eating. Sit in a different chair when you read the paper. Avoid alcohol while trying to quit, and try to  drink fewer caffeinated beverages. Alcohol and caffeine may urge you to smoke.  Avoid foods and drinks that can trigger a desire to smoke, such as sugary or spicy foods and alcohol.  Ask people who smoke not to smoke around you.  Have something planned to do right after eating or having a cup of coffee. For example, plan to take a walk or exercise.  Try a relaxation exercise to calm you down and decrease your stress. Remember, you may be tense and nervous for the first 2 weeks after you quit, but this will pass.  Find new activities to keep your hands busy. Play with a pen, coin, or rubber band. Doodle or draw things on paper.  Brush your teeth right after eating. This will help cut down on the craving for the taste of tobacco after meals. You can also try mouthwash.   Use oral substitutes in place of cigarettes. Try using lemon drops, carrots, cinnamon sticks, or chewing gum. Keep them handy so they are available when you have the urge to smoke.  When you have the urge to smoke, try deep breathing.  Designate your home as a nonsmoking area.  If you are a heavy smoker, ask your health care provider about a prescription for nicotine chewing gum. It can ease your withdrawal from nicotine.  Reward yourself. Set aside the cigarette money you save and buy yourself something nice.  Look for support from others. Join a support group or smoking cessation program. Ask someone at home or at work to help you with your plan to quit smoking.  Always ask yourself, "Do I need this cigarette or is this just a reflex?" Tell yourself, "Today, I choose not to smoke," or "I do not want to smoke." You are reminding yourself of your decision to quit.  Do not replace cigarette smoking with electronic cigarettes (commonly called e-cigarettes). The safety of e-cigarettes is unknown, and some may contain harmful chemicals.  If you relapse, do not give up! Plan ahead and think about what you will do the next  time you get the urge to smoke. HOW WILL I FEEL WHEN I QUIT SMOKING? You may have symptoms of withdrawal because your body is used to nicotine (the addictive substance in cigarettes). You may crave cigarettes, be irritable, feel very hungry, cough often, get headaches, or have difficulty concentrating. The withdrawal symptoms are only temporary. They are strongest when you first quit but will go away within 10-14 days. When withdrawal symptoms occur, stay in control. Think about your reasons for quitting. Remind yourself that these are signs that your body is healing and getting used to being without cigarettes. Remember that withdrawal symptoms are easier to treat than the major diseases that smoking can cause.  Even after the withdrawal is over, expect periodic urges to smoke. However, these cravings are generally short lived and will go away whether you smoke or not. Do not smoke! WHAT RESOURCES ARE AVAILABLE TO HELP ME QUIT SMOKING? Your health care provider can direct you to community resources or hospitals for support, which may include:  Group support.  Education.  Hypnosis.  Therapy.   This information is not intended to replace advice given to you by your health care provider. Make sure you discuss any questions you have with your health care provider.   Document Released: 05/10/2004 Document Revised: 09/02/2014 Document Reviewed: 01/28/2013 Elsevier Interactive Patient Education 2016 [employer].   Steps to Quit Smoking  Smoking tobacco can be harmful to your health and can affect almost every organ in your body. Smoking puts you, and those around you, at risk for developing many serious chronic diseases. Quitting smoking is difficult, but it is one of the best things that you can do for your health. It is never too late to quit. WHAT ARE THE BENEFITS OF QUITTING SMOKING? When you quit smoking, you lower your risk of developing serious diseases and conditions, such as:  Lung  cancer or lung disease, such as COPD.  Heart disease.  Stroke.  Heart attack.  Infertility.  Osteoporosis and bone fractures. Additionally, symptoms such as coughing, wheezing, and shortness of breath may get better when you quit. You may also find that you get sick less often because your body is stronger at fighting off colds and infections. If you are pregnant, quitting smoking can help to reduce your chances of having a baby of low birth weight. HOW DO I GET READY TO QUIT? When you decide to quit smoking, create a plan to make sure that you are successful. Before you quit:  Pick a date to quit. Set a date within the  next two weeks to give you time to prepare.  Write down the reasons why you are quitting. Keep this list in places where you will see it often, such as on your bathroom mirror or in your car or wallet.  Identify the people, places, things, and activities that make you want to smoke (triggers) and avoid them. Make sure to take these actions:  Throw away all cigarettes at home, at work, and in your car.  Throw away smoking accessories, such as Scientist, research (medical).  Clean your car and make sure to empty the ashtray.  Clean your home, including curtains and carpets.  Tell your family, friends, and coworkers that you are quitting. Support from your loved ones can make quitting easier.  Talk with your health care provider about your options for quitting smoking.  Find out what treatment options are covered by your health insurance. WHAT STRATEGIES CAN I USE TO QUIT SMOKING?  Talk with your healthcare provider about different strategies to quit smoking. Some strategies include:  Quitting smoking altogether instead of gradually lessening how much you smoke over a period of time. Research shows that quitting "cold [place]" is more successful than gradually quitting.  Attending in-person counseling to help you build problem-solving skills. You are more likely to have  success in quitting if you attend several counseling sessions. Even short sessions of 10 minutes can be effective.  Finding resources and support systems that can help you to quit smoking and remain smoke-free after you quit. These resources are most helpful when you use them often. They can include:  Online chats with a Social worker.  Telephone quitlines.  Printed Furniture conservator/restorer.  Support groups or group counseling.  Text messaging programs.  Mobile phone applications.  Taking medicines to help you quit smoking. (If you are pregnant or breastfeeding, talk with your health care provider first.) Some medicines contain nicotine and some do not. Both types of medicines help with cravings, but the medicines that include nicotine help to relieve withdrawal symptoms. Your health care provider may recommend:  Nicotine patches, gum, or lozenges.  Nicotine inhalers or sprays.  Non-nicotine medicine that is taken by mouth. Talk with your health care provider about combining strategies, such as taking medicines while you are also receiving in-person counseling. Using these two strategies together makes you more likely to succeed in quitting than if you used either strategy on its own. If you are pregnant or breastfeeding, talk with your health care provider about finding counseling or other support strategies to quit smoking. Do not take medicine to help you quit smoking unless told to do so by your health care provider. WHAT THINGS CAN I DO TO MAKE IT EASIER TO QUIT? Quitting smoking might feel overwhelming at first, but there is a lot that you can do to make it easier. Take these important actions:  Reach out to your family and friends and ask that they support and encourage you during this time. Call telephone quitlines, reach out to support groups, or work with a counselor for support.  Ask people who smoke to avoid smoking around you.  Avoid places that trigger you to smoke, such as bars,  parties, or smoke-break areas at work.  Spend time around people who do not smoke.  Lessen stress in your life, because stress can be a smoking trigger for some people. To lessen stress, try:  Exercising regularly.  Deep-breathing exercises.  Yoga.  Meditating.  Performing a body scan. This involves closing your eyes, scanning  your body from head to toe, and noticing which parts of your body are particularly tense. Purposefully relax the muscles in those areas.  Download or purchase mobile phone or tablet apps (applications) that can help you stick to your quit plan by providing reminders, tips, and encouragement. There are many free apps, such as QuitGuide from the [employer] Office manager for Disease Control and Prevention). You can find other support for quitting smoking (smoking cessation) through smokefree.gov and other websites. HOW WILL I FEEL WHEN I QUIT SMOKING? Within the first 24 hours of quitting smoking, you may start to feel some withdrawal symptoms. These symptoms are usually most noticeable 2-3 days after quitting, but they usually do not last beyond 2-3 weeks. Changes or symptoms that you might experience include:  Mood swings.  Restlessness, anxiety, or irritation.  Difficulty concentrating.  Dizziness.  Strong cravings for sugary foods in addition to nicotine.  Mild weight gain.  Constipation.  Nausea.  Coughing or a sore throat.  Changes in how your medicines work in your body.  A depressed mood.  Difficulty sleeping (insomnia). After the first 2-3 weeks of quitting, you may start to notice more positive results, such as:  Improved sense of smell and taste.  Decreased coughing and sore throat.  Slower heart rate.  Lower blood pressure.  Clearer skin.  The ability to breathe more easily.  Fewer sick days. Quitting smoking is very challenging for most people. Do not get discouraged if you are not successful the first time. Some people need to make many  attempts to quit before they achieve long-term success. Do your best to stick to your quit plan, and talk with your health care provider if you have any questions or concerns.   This information is not intended to replace advice given to you by your health care provider. Make sure you discuss any questions you have with your health care provider.   Document Released: 08/06/2001 Document Revised: 12/27/2014 Document Reviewed: 12/27/2014 Elsevier Interactive Patient Education [employer].

## 2015-07-06 NOTE — Progress Notes (Signed)
Established Carotid Patient   History of Present Illness  Shelley [name] is a 74 y.o. female patient of Dr. Oneida Alar with a history of carotid artery stenosis and no history of carotid intervention.  She returns for surveillance.  The patient does have a history of MI and 2 cardiac stents placed in 2004 and TIA symptoms many years ago manifested as transient right arm weakness, but denies history of stroke.  She has had no further stroke or TIA symptoms.  The patient denies amaurosis fugax or monocular blindness. The patient denies facial drooping.  Pt. denies current hemiplegia. The patient denies receptive or expressive aphasia.  Patient denies non-healing wounds.  Patient reports New Medical or Surgical History: she is falling as her legs seem to give way, states this is due to her knee and back issues.  Wearing a left knee brace as measure to help severe left knee OA, does not want knee replacement.  She lives in a 3 story house and climbs stairs several times/day, but her ambulation is otherwise limited by arthritis and pain from lumbar spine problems: DDD, HNP, arthritis, left leg sciatica.   Pt states her blood pressure is usually 960'A systolic.  Pt Diabetic: No, she is checked every 6 months, last A1C was 5.5, per pt  Pt smoker: smoker (decreased to 1 cigarette/day, started in high school, quit several times in these yrs)   Pt meds include:  Statin : Yes  Betablocker: Yes  ASA: Yes  Other anticoagulants/antiplatelets: Plavix    Past Medical History  Diagnosis Date  . COPD (chronic obstructive pulmonary disease) ([place])   . Osteoarthritis   . Sinus polyp   . Mixed hyperlipidemia   . Regional enteritis of large intestine ([place])   . Pancreatitis   . Rectal bleeding   . Hepatic steatosis   . History of oral aphthous ulcers   . Essential hypertension, benign   . TIA (transient ischemic attack)   . Hx of adenomatous colonic polyps   . Carotid artery disease  ([place])     54-09% RICA and LICA 8/11 - Dr. Oneida Alar  . Coronary atherosclerosis of native coronary artery     BMS to RCA 2004  . Myocardial infarction ([place])     IMI 10/04  . History of Salmonella gastroenteritis   . Cancer ([place])     Melanoma  . Anxiety   . Depression   . Cataract   . Osteoporosis   . Tubular adenoma of colon 08/2012  . Stroke ([place]) 2000    mini  . DVT (deep venous thrombosis) ([place])     Social History Social History  Substance Use Topics  . Smoking status: Light Tobacco Smoker -- 0.25 packs/day for 40 years    Types: Cigarettes    Start date: 06/02/1957  . Smokeless tobacco: Never Used     Comment: trying to quit 1/2 pack day - so much stress in her lift  . Alcohol Use: No    Family History Family History  Problem Relation Age of Onset  . Diabetes type II Mother   . Heart attack Mother 81    questionable  . Uterine cancer Mother   . Diabetes Mother   . Heart disease Mother     before age 62  . Cancer Mother   . Varicose Veins Mother   . Colon cancer Neg Hx   . Other Brother     PVD and hx DVT  . Deep vein thrombosis Brother   .  Breast cancer Sister   . Cancer Sister     Breast  . Hyperlipidemia Sister   . Hypertension Sister     Surgical History Past Surgical History  Procedure Laterality Date  . Right total knee arthroplasty  2/12  . Melanoma resection  1996  . Cataract extraction      left eye  . Eye surgery    . Joint replacement  Feb 2012    Right Knee    Allergies  Allergen Reactions  . Mercaptopurine     REACTION: pancreatitis  . Sulfa Antibiotics   . Penicillins Rash    REACTION: rash    Current Outpatient Prescriptions  Medication Sig Dispense Refill  . aspirin 81 MG tablet Take 81 mg by mouth daily.      . Calcium 600-200 MG-UNIT per tablet Take 1 tablet by mouth daily.      . Cholecalciferol (VITAMIN D3) 1000 UNITS CAPS Take 1 capsule by mouth daily.    . clopidogrel (PLAVIX) 75 MG tablet TAKE 1 TABLET BY MOUTH EVERY  DAY WITH BREAKFAST 30 tablet 6  . CRESTOR 10 MG tablet TAKE 1 TABLET BY MOUTH DAILY 30 tablet 6  . dicyclomine (BENTYL) 10 MG capsule Take 1 capsule (10 mg total) by mouth as needed for spasms. 30 capsule 3  . Fexofenadine HCl (ALLEGRA ALLERGY PO) Take 1 tablet by mouth as needed.    Marland Kitchen FLUoxetine (PROZAC) 40 MG capsule Take 40 mg by mouth daily.    . folic acid (FOLVITE) 1 MG tablet Take 1 tablet (1 mg total) by mouth daily. 100 tablet 11  . HYDROcodone-acetaminophen (LORCET) 10-650 MG per tablet Take 1 tablet by mouth every 12 (twelve) hours as needed.      . metoprolol succinate (TOPROL-XL) 25 MG 24 hr tablet TAKE 1 TABLET BY MOUTH DAILY 60 tablet 3  . nitroGLYCERIN (NITROSTAT) 0.4 MG SL tablet Place 1 tablet (0.4 mg total) under the tongue every 5 (five) minutes x 3 doses as needed. 25 tablet 3  . Omega-3 Fatty Acids (FISH OIL) 1200 MG CAPS Take 1 capsule by mouth daily.      Marland Kitchen sulfaSALAzine (AZULFIDINE) 500 MG tablet Take 2 tablets (1,000 mg total) by mouth 2 (two) times daily. 120 tablet 11  . ALPRAZolam (XANAX) 0.25 MG tablet Take 1 tablet (0.25 mg total) by mouth at bedtime as needed for anxiety. (Patient not taking: Reported on 07/06/2015) 30 tablet 3   No current facility-administered medications for this visit.    Review of Systems : See HPI for pertinent positives and negatives.  Physical Examination  Filed Vitals:   07/06/15 1107 07/06/15 1112 07/06/15 1116  BP: 149/67 140/68 143/68  Pulse: 52 53 54  Temp:  97 F (36.1 C)   TempSrc:  Oral   Resp:  14   Height:  _0  (1.651 m)   Weight:  222 lb (100.699 kg)   SpO2:  96%    Body mass index is 36.94 kg/(m^2).  General: WDWN obese female in NAD. GAIT: uses cane, antalgic.  Eyes: PERRLA  Pulmonary: no dyspnea, limited air movement, no rales, rhonchi, or wheezes.  Cardiac: regular rhythm, no detected murmur.  VASCULAR EXAM  Carotid Bruits  Left  Right    Negative  Negative   Aorta is not palpable.   Radial pulses are 2+ palpable and equal.   LE Pulses  LEFT  RIGHT   POPLITEAL  not palpable  not palpable   POSTERIOR TIBIAL  not  palpable  not palpable   DORSALIS PEDIS  ANTERIOR TIBIAL  palpable  palpable    Gastrointestinal: soft, nontender, BS WNL, no r/g, no palpated masses.  Musculoskeletal: No muscle atrophy/wasting. M/S 5/5 throughout except left lower extremity is 4/5, Extremities without ischemic changes. Chronic venous stasis changes in lower legs with 1+ bilateral pitting edema, leathery skin of lower legs, no ulcerations.  Neurologic: A&O X 3; Appropriate Affect, Speech is normal  CN 2-12 intact except, Pain and light touch intact in extremities, Motor exam as listed above.                 Non-Invasive Vascular Imaging CAROTID DUPLEX 07/06/2015   Right ICA: 60 - 79 % stenosis. Left ICA: 40 - 59 % stenosis. Bilateral vertebral arteries are antegrade. No significant change compared to 12/22/14.   Assessment: CHRISTA FASIG is a 74 y.o. female who has a history of a remote TIA, none subsequently.  Today's carotid duplex suggests 60-79% right ICA stenosis and 40-59% left ICA stenosis.  No significant change compared to 12/22/14.    Plan:  The patient was counseled re smoking cessation and given several free resources re smoking cessation.  Follow-up in 6 months with Carotid Duplex scan.   I discussed in depth with the patient the nature of atherosclerosis, and emphasized the importance of maximal medical management including strict control of blood pressure, blood glucose, and lipid levels, obtaining regular exercise, and cessation of smoking.  The patient is aware that without maximal medical management the underlying atherosclerotic disease process will progress, limiting the benefit of any interventions. The patient was given information about stroke prevention and what symptoms should prompt the patient to seek immediate  medical care. Thank you for allowing [place] to participate in this patient's care.  Clemon Chambers, RN, MSN, FNP-C [facility] Office: [phone]  Clinic Physician: Early  07/06/2015 11:31 AM

## 2015-07-06 NOTE — Progress Notes (Signed)
Filed Vitals:   07/06/15 1107 07/06/15 1112 07/06/15 1116  BP: 149/67 140/68 143/68  Pulse: 52 53 54  Temp:  97 F (36.1 C)   TempSrc:  Oral   Resp:  14   Height:  _0  (1.651 m)   Weight:  222 lb (100.699 kg)   SpO2:  96%

## 2015-07-18 ENCOUNTER — Encounter: Payer: Self-pay | Admitting: Cardiology

## 2015-07-18 ENCOUNTER — Ambulatory Visit (INDEPENDENT_AMBULATORY_CARE_PROVIDER_SITE_OTHER): Payer: Medicare Other | Admitting: Cardiology

## 2015-07-18 ENCOUNTER — Encounter: Payer: Self-pay | Admitting: *Deleted

## 2015-07-18 VITALS — BP 160/78 | HR 63 | Ht 65.0 in | Wt 222.0 lb

## 2015-07-18 DIAGNOSIS — E782 Mixed hyperlipidemia: Secondary | ICD-10-CM | POA: Diagnosis not present

## 2015-07-18 DIAGNOSIS — I2510 Atherosclerotic heart disease of native coronary artery without angina pectoris: Secondary | ICD-10-CM | POA: Diagnosis not present

## 2015-07-18 DIAGNOSIS — I6523 Occlusion and stenosis of bilateral carotid arteries: Secondary | ICD-10-CM | POA: Diagnosis not present

## 2015-07-18 DIAGNOSIS — R0609 Other forms of dyspnea: Secondary | ICD-10-CM | POA: Diagnosis not present

## 2015-07-18 MED ORDER — METOPROLOL SUCCINATE ER 25 MG PO TB24: 25.0000 mg | ORAL_TABLET | Freq: Every day | ORAL | Status: DC

## 2015-07-18 NOTE — Progress Notes (Signed)
Cardiology Office Note  Date: 07/18/2015   ID: JADIA CAPERS, DOB 1940/11/09, MRN [id number]  PCP: Deloria Lair, MD  Primary Cardiologist: Rozann Lesches, MD   Chief Complaint  Patient presents with  . Coronary Artery Disease    History of Present Illness: Shelley [name] is a 74 y.o. female last seen in May 2016. She presents for a routine follow-up visit. She states that something feels "off." She does not specifically endorse angina, but has been fatigued and short of breath. Also continues to have trouble with her balance, has had some falls despite using a cane. She does not report any sudden palpitations, dizziness, or syncope.  She continues to follow with Dr. Oneida Alar for carotid artery disease, recent Dopplers in [place] outlined below and stable.  Cardiolite from October 2011 demonstrated evidence of probable mid to basal inferior scar without ischemia, LVEF 50%. We did discuss obtaining a follow-up surveillance Cardiolite to reassess ischemic burden around the time of her last visit, however she elected not to proceed at that time. She is agreeable to pursue follow-up testing now. ECG today shows sinus rhythm with old inferior infarct pattern.  Past Medical History  Diagnosis Date  . COPD (chronic obstructive pulmonary disease) ([place])   . Osteoarthritis   . Sinus polyp   . Mixed hyperlipidemia   . Regional enteritis of large intestine ([place])   . Pancreatitis   . Rectal bleeding   . Hepatic steatosis   . History of oral aphthous ulcers   . Essential hypertension, benign   . TIA (transient ischemic attack)   . Hx of adenomatous colonic polyps   . Carotid artery disease ([place])     63-78% RICA and LICA 5/88 - Dr. Oneida Alar  . Coronary atherosclerosis of native coronary artery     BMS to RCA 2004  . Myocardial infarction ([place])     IMI 10/04  . History of Salmonella gastroenteritis   . History of melanoma   . Anxiety   . Depression   . Cataract   .  Osteoporosis   . Tubular adenoma of colon 08/2012  . DVT (deep venous thrombosis) ([place])     Current Outpatient Prescriptions  Medication Sig Dispense Refill  . ALPRAZolam (XANAX) 0.25 MG tablet Take 1 tablet (0.25 mg total) by mouth at bedtime as needed for anxiety. 30 tablet 3  . aspirin 81 MG tablet Take 81 mg by mouth daily.      . Cholecalciferol (VITAMIN D3) 1000 UNITS CAPS Take 1 capsule by mouth daily.    . clopidogrel (PLAVIX) 75 MG tablet TAKE 1 TABLET BY MOUTH EVERY DAY WITH BREAKFAST 30 tablet 6  . CRESTOR 10 MG tablet TAKE 1 TABLET BY MOUTH DAILY 30 tablet 6  . dicyclomine (BENTYL) 10 MG capsule Take 1 capsule (10 mg total) by mouth as needed for spasms. 30 capsule 3  . Fexofenadine HCl (ALLEGRA ALLERGY PO) Take 1 tablet by mouth as needed.    Marland Kitchen FLUoxetine (PROZAC) 40 MG capsule Take 40 mg by mouth daily.    . folic acid (FOLVITE) 1 MG tablet Take 1 tablet (1 mg total) by mouth daily. 100 tablet 11  . HYDROcodone-acetaminophen (LORCET) 10-650 MG per tablet Take 1 tablet by mouth every 12 (twelve) hours as needed.      . metoprolol succinate (TOPROL-XL) 25 MG 24 hr tablet Take 1 tablet (25 mg total) by mouth daily. 60 tablet 3  . nitroGLYCERIN (NITROSTAT) 0.4 MG SL tablet Place 1  tablet (0.4 mg total) under the tongue every 5 (five) minutes x 3 doses as needed. 25 tablet 3  . Omega-3 Fatty Acids (FISH OIL) 1200 MG CAPS Take 1 capsule by mouth daily.      Marland Kitchen sulfaSALAzine (AZULFIDINE) 500 MG tablet Take 2 tablets (1,000 mg total) by mouth 2 (two) times daily. 120 tablet 11   No current facility-administered medications for this visit.    Allergies:  Mercaptopurine; Sulfa antibiotics; and Penicillins   Social History: The patient  reports that she has been smoking Cigarettes.  She started smoking about 58 years ago. She has a 10 pack-year smoking history. She has never used smokeless tobacco. She reports that she does not drink alcohol or use illicit drugs.   ROS:  Please see the  history of present illness. Otherwise, complete review of systems is positive for 3 falls over the last 6 months.  All other systems are reviewed and negative.   Physical Exam: VS:  BP 160/78 mmHg  Pulse 63  Ht _0  (1.651 m)  Wt 222 lb (100.699 kg)  BMI 36.94 kg/m2  SpO2 91%, BMI Body mass index is 36.94 kg/(m^2).  Wt Readings from Last 3 Encounters:  07/18/15 222 lb (100.699 kg)  07/06/15 222 lb (100.699 kg)  06/28/15 222 lb 4 oz (100.812 kg)     Comfortable in no acute distress. Using a cane. HEENT: Conjuctivae and lids normal, oropharynx clear.  Neck: Supple, no elevated JVP, soft right carotid bruit, no thyromegaly or tenderness.  Lungs: Somewhat prolonged expiratory phase with scattered rhonchi, no frank wheezing. Nonlabored at rest. Cor: RRR, no pathologic systolic murmurs. No S3 or rub.  Ext: Mild edema and venous stasis. Distal pulses 1-2+.  Skin: Warm and dry.  Musculoskeletal: No gross deformities.  Neuropsychiatric: Alert and oriented x3, affect appropriate.   ECG: ECG is ordered today.   Recent Labwork: 11/09/2014: ALT 9; AST 12; Hemoglobin 13.1; Platelets 236.0  March 2016: BUN 10, creatinine 0.6, potassium 4.2, AST 18, ALT 12, cholesterol 132, triglycerides 101, HDL 74, LDL 38  Other Studies Reviewed Today:  Carotid Dopplers from 07/06/2015 showed 60-79% RICA stenosis and 16-10% LICA stenosis.  Assessment and Plan:  1. CAD status was BMS to the RCA in 2004. Last ischemic workup was in 2011. She has agreed to proceed with follow-up Lexiscan Cardiolite now in light of fatigue and dyspnea on exertion. Her ECG is consistent with old inferior infarct pattern.  2. Hyperlipidemia on Crestor. HDL 74 and LDL 38 earlier this year.  3. Bilateral carotid artery disease, followed by Dr. Oneida Alar.  Current medicines were reviewed with the patient today.   Orders Placed This Encounter  Procedures  . NM Myocar Multi W/Spect W/Wall Motion / EF  . Myocardial  Perfusion Imaging  . EKG 12-Lead    Disposition: FU with me in 6 months.   Signed, Satira Sark, MD, [facility] 07/18/2015 11:00 AM    [facility] [place], [place], [place] [address] Phone: [phone]; Fax: [phone]

## 2015-07-18 NOTE — Patient Instructions (Signed)
Your physician recommends that you continue on your current medications as directed. Please refer to the Current Medication list given to you today. Your physician has requested that you have a lexiscan myoview. For further information please visit [email or web address]. Please follow instruction sheet, as given. Your physician recommends that you schedule a follow-up appointment in: 6 months. You will receive a reminder letter in the mail in about 4 months reminding you to call and schedule your appointment. If you don't receive this letter, please contact our office.

## 2015-07-19 ENCOUNTER — Telehealth: Payer: Self-pay | Admitting: Cardiology

## 2015-07-19 NOTE — Telephone Encounter (Signed)
Lexiscan Myoview scheduled for 29.5 Checking percert

## 2015-07-26 NOTE — Telephone Encounter (Signed)
approved

## 2015-07-28 ENCOUNTER — Encounter (HOSPITAL_COMMUNITY): Payer: Medicare Other

## 2015-08-10 ENCOUNTER — Telehealth: Payer: Self-pay | Admitting: Cardiology

## 2015-08-10 NOTE — Telephone Encounter (Signed)
Patient advised to have her pharmacy contact us in January with whatever issue there may be concerning crestor and we would respond during that time so that she can get crestor or make any changes that may need to be done. Patient verbalized understanding of plan.

## 2015-08-10 NOTE — Telephone Encounter (Signed)
Mrs. Riede is looking to switch her insurance 08/27/15.  She is being told that the "new" insurance will not pay for Crestor 1m. Mrs. BWaldvogelis wanting to know if she will be able to switch to a generic medication

## 2015-08-11 ENCOUNTER — Encounter (HOSPITAL_COMMUNITY)
Admission: RE | Admit: 2015-08-11 | Discharge: 2015-08-11 | Disposition: A | Discharge status: Home / Self Care | Payer: Medicare Other | Source: Ambulatory Visit | Attending: Cardiology | Admitting: Cardiology

## 2015-08-11 ENCOUNTER — Encounter (HOSPITAL_COMMUNITY): Payer: Medicare Other

## 2015-08-11 ENCOUNTER — Ambulatory Visit (HOSPITAL_COMMUNITY): Payer: Medicare Other

## 2015-08-11 ENCOUNTER — Encounter (HOSPITAL_COMMUNITY): Payer: Self-pay

## 2015-08-11 ENCOUNTER — Inpatient Hospital Stay (HOSPITAL_COMMUNITY): Admission: RE | Admit: 2015-08-11 | Payer: Medicare Other | Source: Ambulatory Visit

## 2015-08-11 DIAGNOSIS — I2510 Atherosclerotic heart disease of native coronary artery without angina pectoris: Secondary | ICD-10-CM | POA: Diagnosis present

## 2015-08-11 DIAGNOSIS — R9439 Abnormal result of other cardiovascular function study: Secondary | ICD-10-CM | POA: Insufficient documentation

## 2015-08-11 DIAGNOSIS — R0609 Other forms of dyspnea: Secondary | ICD-10-CM | POA: Diagnosis not present

## 2015-08-11 LAB — NM MYOCAR MULTI W/SPECT W/WALL MOTION / EF
CHL CUP NUCLEAR SDS: 9
CHL CUP NUCLEAR SRS: 8
CHL CUP NUCLEAR SSS: 17
LV sys vol: 47 mL
LVDIAVOL: 97 mL
Peak HR: 67 {beats}/min
RATE: 0.31
Rest HR: 53 {beats}/min
TID: 1.09

## 2015-08-11 MED ORDER — TECHNETIUM TC 99M SESTAMIBI - CARDIOLITE
30.0000 | Freq: Once | INTRAVENOUS | Status: AC | PRN
  Administered 2015-08-11: 30 via INTRAVENOUS

## 2015-08-11 MED ORDER — REGADENOSON 0.4 MG/5ML IV SOLN
INTRAVENOUS | Status: AC
  Administered 2015-08-11: 0.4 mg via INTRAVENOUS
  Filled 2015-08-11: qty 5

## 2015-08-11 MED ORDER — TECHNETIUM TC 99M SESTAMIBI GENERIC - CARDIOLITE
10.0000 | Freq: Once | INTRAVENOUS | Status: AC | PRN
  Administered 2015-08-11: 10 via INTRAVENOUS

## 2015-08-11 MED ORDER — SODIUM CHLORIDE 0.9 % IJ SOLN
INTRAMUSCULAR | Status: AC
  Administered 2015-08-11: 10 mL via INTRAVENOUS
  Filled 2015-08-11: qty 3

## 2015-08-14 ENCOUNTER — Telehealth: Payer: Self-pay | Admitting: *Deleted

## 2015-08-14 NOTE — Telephone Encounter (Signed)
Patient informed. 

## 2015-08-14 NOTE — Telephone Encounter (Signed)
-----  Message from Satira Sark, MD sent at 08/11/2015  4:33 PM EST ----- Reviewed report. Study is consistent with scar from previous myocardial infarction in the inferior wall which is consistent with her history. LVEF is low normal range. There were no large ischemic territories. Would recommend continuing medical therapy unless her symptoms worsen.

## 2015-10-11 DIAGNOSIS — Z79899 Other long term (current) drug therapy: Secondary | ICD-10-CM | POA: Diagnosis not present

## 2015-10-11 DIAGNOSIS — I252 Old myocardial infarction: Secondary | ICD-10-CM | POA: Diagnosis not present

## 2015-10-11 DIAGNOSIS — F17200 Nicotine dependence, unspecified, uncomplicated: Secondary | ICD-10-CM | POA: Diagnosis not present

## 2015-10-11 DIAGNOSIS — W19XXXA Unspecified fall, initial encounter: Secondary | ICD-10-CM | POA: Diagnosis not present

## 2015-10-11 DIAGNOSIS — R062 Wheezing: Secondary | ICD-10-CM | POA: Diagnosis not present

## 2015-10-11 DIAGNOSIS — R51 Headache: Secondary | ICD-10-CM | POA: Diagnosis not present

## 2015-10-11 DIAGNOSIS — S0990XA Unspecified injury of head, initial encounter: Secondary | ICD-10-CM | POA: Diagnosis not present

## 2015-10-11 DIAGNOSIS — S0003XA Contusion of scalp, initial encounter: Secondary | ICD-10-CM | POA: Diagnosis not present

## 2015-10-11 DIAGNOSIS — S92425A Nondisplaced fracture of distal phalanx of left great toe, initial encounter for closed fracture: Secondary | ICD-10-CM | POA: Diagnosis not present

## 2015-10-11 DIAGNOSIS — Y939 Activity, unspecified: Secondary | ICD-10-CM | POA: Diagnosis not present

## 2015-10-11 DIAGNOSIS — Y92009 Unspecified place in unspecified non-institutional (private) residence as the place of occurrence of the external cause: Secondary | ICD-10-CM | POA: Diagnosis not present

## 2015-10-11 DIAGNOSIS — I10 Essential (primary) hypertension: Secondary | ICD-10-CM | POA: Diagnosis not present

## 2015-10-11 DIAGNOSIS — E7800 Pure hypercholesterolemia, unspecified: Secondary | ICD-10-CM | POA: Diagnosis not present

## 2015-10-11 DIAGNOSIS — Z7982 Long term (current) use of aspirin: Secondary | ICD-10-CM | POA: Diagnosis not present

## 2015-10-11 DIAGNOSIS — M1990 Unspecified osteoarthritis, unspecified site: Secondary | ICD-10-CM | POA: Diagnosis not present

## 2015-10-11 DIAGNOSIS — Z7902 Long term (current) use of antithrombotics/antiplatelets: Secondary | ICD-10-CM | POA: Diagnosis not present

## 2015-10-11 DIAGNOSIS — W01190A Fall on same level from slipping, tripping and stumbling with subsequent striking against furniture, initial encounter: Secondary | ICD-10-CM | POA: Diagnosis not present

## 2015-10-24 ENCOUNTER — Other Ambulatory Visit: Payer: Self-pay | Admitting: *Deleted

## 2015-10-24 MED ORDER — CLOPIDOGREL BISULFATE 75 MG PO TABS: 75.0000 mg | ORAL_TABLET | Freq: Every day | ORAL | Status: DC

## 2015-10-26 DIAGNOSIS — R296 Repeated falls: Secondary | ICD-10-CM | POA: Diagnosis not present

## 2015-10-26 DIAGNOSIS — E785 Hyperlipidemia, unspecified: Secondary | ICD-10-CM | POA: Diagnosis not present

## 2015-10-26 DIAGNOSIS — S92405A Nondisplaced unspecified fracture of left great toe, initial encounter for closed fracture: Secondary | ICD-10-CM | POA: Diagnosis not present

## 2015-10-26 DIAGNOSIS — I10 Essential (primary) hypertension: Secondary | ICD-10-CM | POA: Diagnosis not present

## 2015-10-26 DIAGNOSIS — R7301 Impaired fasting glucose: Secondary | ICD-10-CM | POA: Diagnosis not present

## 2015-10-27 DIAGNOSIS — S92405A Nondisplaced unspecified fracture of left great toe, initial encounter for closed fracture: Secondary | ICD-10-CM | POA: Diagnosis not present

## 2015-11-10 DIAGNOSIS — S92405D Nondisplaced unspecified fracture of left great toe, subsequent encounter for fracture with routine healing: Secondary | ICD-10-CM | POA: Diagnosis not present

## 2015-11-24 DIAGNOSIS — S92405D Nondisplaced unspecified fracture of left great toe, subsequent encounter for fracture with routine healing: Secondary | ICD-10-CM | POA: Diagnosis not present

## 2015-12-12 ENCOUNTER — Telehealth: Payer: Self-pay | Admitting: Cardiology

## 2015-12-12 MED ORDER — ROSUVASTATIN CALCIUM 10 MG PO TABS: 10.0000 mg | ORAL_TABLET | Freq: Every day | ORAL | Status: DC

## 2015-12-12 NOTE — Telephone Encounter (Signed)
Mrs. Dexter called in stating that her insurance is wanting to charge $275.00 per month for Crestor 109m . Please advise

## 2015-12-12 NOTE — Telephone Encounter (Signed)
Patient said that brand name crestor is $275 per month and the pharmacist told her that it would be cheaper to get the generic crestor. Patient advised that a new prescription would be sent to her pharmacy for the generic crestor. Contacted [facility] and they said that patient requested brand name crestor but they could fill the generic crestor for $11.30.

## 2015-12-22 DIAGNOSIS — S92405D Nondisplaced unspecified fracture of left great toe, subsequent encounter for fracture with routine healing: Secondary | ICD-10-CM | POA: Diagnosis not present

## 2015-12-29 ENCOUNTER — Encounter: Payer: Self-pay | Admitting: Family

## 2016-01-02 DIAGNOSIS — Z85820 Personal history of malignant melanoma of skin: Secondary | ICD-10-CM | POA: Diagnosis not present

## 2016-01-02 DIAGNOSIS — Z1283 Encounter for screening for malignant neoplasm of skin: Secondary | ICD-10-CM | POA: Diagnosis not present

## 2016-01-04 ENCOUNTER — Ambulatory Visit (HOSPITAL_COMMUNITY)
Admission: RE | Admit: 2016-01-04 | Discharge: 2016-01-04 | Disposition: A | Discharge status: Home / Self Care | Payer: PPO | Source: Ambulatory Visit | Attending: Family | Admitting: Family

## 2016-01-04 ENCOUNTER — Encounter: Payer: Self-pay | Admitting: Family

## 2016-01-04 ENCOUNTER — Ambulatory Visit (INDEPENDENT_AMBULATORY_CARE_PROVIDER_SITE_OTHER): Payer: PPO | Admitting: Family

## 2016-01-04 VITALS — BP 101/48 | HR 53 | Temp 97.9°F | Ht 65.0 in | Wt 217.2 lb

## 2016-01-04 DIAGNOSIS — I10 Essential (primary) hypertension: Secondary | ICD-10-CM | POA: Insufficient documentation

## 2016-01-04 DIAGNOSIS — E782 Mixed hyperlipidemia: Secondary | ICD-10-CM | POA: Insufficient documentation

## 2016-01-04 DIAGNOSIS — I2510 Atherosclerotic heart disease of native coronary artery without angina pectoris: Secondary | ICD-10-CM | POA: Diagnosis not present

## 2016-01-04 DIAGNOSIS — Z720 Tobacco use: Secondary | ICD-10-CM

## 2016-01-04 DIAGNOSIS — Z8673 Personal history of transient ischemic attack (TIA), and cerebral infarction without residual deficits: Secondary | ICD-10-CM | POA: Diagnosis not present

## 2016-01-04 DIAGNOSIS — F17200 Nicotine dependence, unspecified, uncomplicated: Secondary | ICD-10-CM

## 2016-01-04 DIAGNOSIS — I6523 Occlusion and stenosis of bilateral carotid arteries: Secondary | ICD-10-CM

## 2016-01-04 NOTE — Progress Notes (Signed)
Chief Complaint: Follow up Extracranial Carotid Artery Stenosis   History of Present Illness  Shelley [name] is a 75 y.o. female patient of Dr. Oneida Alar with a history of carotid artery stenosis and no history of carotid intervention.  She returns for surveillance.  The patient does have a history of MI and 2 cardiac stents placed in 2004 and TIA symptoms many years ago manifested as transient right arm weakness, but denies history of stroke.  She has had no further stroke or TIA symptoms.  The patient denies amaurosis fugax or monocular blindness. The patient denies facial drooping.  Pt. denies current hemiplegia. The patient denies receptive or expressive aphasia.  Patient denies non-healing wounds.   She has fallen a few times as her legs seem to give way, states this is due to her knee and back issues. Wearing a left knee brace as measure to help severe left knee OA, does not want knee replacement.  She lives in a 3 story house and climbs stairs several times/day, but her ambulation is otherwise limited by arthritis and pain from lumbar spine problems: DDD, HNP, arthritis, left leg sciatica.   Pt denies any more dyspnea than usual, denies chest pain, states she has a smoker's cough, but has been bothered by more cough this Spring.  Pt Diabetic: No, she is checked every 6 months, last A1C was 5.5, per pt  Pt smoker: smoker (decreased to 1 cigarette/day, started in high school, quit several times in these yrs)   Pt meds include:  Statin : Yes  Betablocker: Yes  ASA: Yes  Other anticoagulants/antiplatelets: Plavix    Past Medical History  Diagnosis Date  . COPD (chronic obstructive pulmonary disease) ([place])   . Osteoarthritis   . Sinus polyp   . Mixed hyperlipidemia   . Regional enteritis of large intestine ([place])   . Pancreatitis   . Rectal bleeding   . Hepatic steatosis   . History of oral aphthous ulcers   . Essential hypertension, benign   . TIA  (transient ischemic attack)   . Hx of adenomatous colonic polyps   . Carotid artery disease ([place])     16-10% RICA and LICA 9/60 - Dr. Oneida Alar  . Coronary atherosclerosis of native coronary artery     BMS to RCA 2004  . Myocardial infarction ([place])     IMI 10/04  . History of Salmonella gastroenteritis   . History of melanoma   . Anxiety   . Depression   . Cataract   . Osteoporosis   . Tubular adenoma of colon 08/2012  . DVT (deep venous thrombosis) ([place])   . Fracture Left Great Toe    Social History Social History  Substance Use Topics  . Smoking status: Light Tobacco Smoker -- 0.25 packs/day for 40 years    Types: Cigarettes    Start date: 06/02/1957  . Smokeless tobacco: Never Used     Comment: trying to quit 1/2 pack day - so much stress in her lift  . Alcohol Use: No    Family History Family History  Problem Relation Age of Onset  . Diabetes type II Mother   . Heart attack Mother 97    questionable  . Uterine cancer Mother   . Diabetes Mother   . Heart disease Mother     before age 80  . Cancer Mother   . Varicose Veins Mother   . Colon cancer Neg Hx   . Other Brother     PVD  and hx DVT  . Deep vein thrombosis Brother   . Breast cancer Sister   . Cancer Sister     Breast  . Hyperlipidemia Sister   . Hypertension Sister     Surgical History Past Surgical History  Procedure Laterality Date  . Melanoma resection  1996  . Cataract extraction Left   . Eye surgery    . Total knee arthroplasty Right Feb 2012    Allergies  Allergen Reactions  . Mercaptopurine     REACTION: pancreatitis  . Sulfa Antibiotics   . Penicillins Rash    REACTION: rash    Current Outpatient Prescriptions  Medication Sig Dispense Refill  . aspirin 81 MG tablet Take 81 mg by mouth daily.      . Cholecalciferol (VITAMIN D3) 1000 UNITS CAPS Take 1 capsule by mouth daily.    . clopidogrel (PLAVIX) 75 MG tablet Take 1 tablet (75 mg total) by mouth daily. 30 tablet 6  .  dicyclomine (BENTYL) 10 MG capsule Take 1 capsule (10 mg total) by mouth as needed for spasms. 30 capsule 3  . Fexofenadine HCl (ALLEGRA ALLERGY PO) Take 1 tablet by mouth as needed. Reported on 01/04/2016    . FLUoxetine (PROZAC) 40 MG capsule Take 40 mg by mouth daily.    Marland Kitchen HYDROcodone-acetaminophen (LORCET) 10-650 MG per tablet Take 1 tablet by mouth every 12 (twelve) hours as needed.      . metoprolol succinate (TOPROL-XL) 25 MG 24 hr tablet Take 1 tablet (25 mg total) by mouth daily. 60 tablet 3  . nitroGLYCERIN (NITROSTAT) 0.4 MG SL tablet Place 1 tablet (0.4 mg total) under the tongue every 5 (five) minutes x 3 doses as needed. 25 tablet 3  . Omega-3 Fatty Acids (FISH OIL) 1200 MG CAPS Take 1 capsule by mouth daily.      . rosuvastatin (CRESTOR) 10 MG tablet Take 1 tablet (10 mg total) by mouth daily. 90 tablet 3  . sulfaSALAzine (AZULFIDINE) 500 MG tablet Take 2 tablets (1,000 mg total) by mouth 2 (two) times daily. 120 tablet 11  . ALPRAZolam (XANAX) 0.25 MG tablet Take 1 tablet (0.25 mg total) by mouth at bedtime as needed for anxiety. (Patient not taking: Reported on 01/04/2016) 30 tablet 3  . folic acid (FOLVITE) 1 MG tablet Take 1 tablet (1 mg total) by mouth daily. 100 tablet 11   No current facility-administered medications for this visit.    Review of Systems : See HPI for pertinent positives and negatives.  Physical Examination  Filed Vitals:   01/04/16 1033  BP: 101/48  Pulse: 53  Temp: 97.9 F (36.6 C)  TempSrc: Oral  Height: _0  (1.651 m)  Weight: 217 lb 3.2 oz (98.521 kg)  SpO2: 97%   Body mass index is 36.14 kg/(m^2).  General: WDWN obese female in NAD. GAIT: uses cane, antalgic.  Eyes: PERRLA  Pulmonary: no dyspnea, limited air movement in all fields, + rales in both bases, no rhonchi, few wheezes.  Cardiac: regular rhythm, no detected murmur.  VASCULAR EXAM  Carotid Bruits  Left  Right    Negative  positive  Aorta is not  palpable.  Radial pulses are 2+ palpable and equal.   LE Pulses  LEFT  RIGHT   POPLITEAL  not palpable  not palpable   POSTERIOR TIBIAL  not palpable  not palpable   DORSALIS PEDIS  ANTERIOR TIBIAL  palpable  palpable    Gastrointestinal: soft, nontender, BS WNL, no r/g, no palpated  masses.  Musculoskeletal: No muscle atrophy/wasting. M/S 5/5 throughout except left lower extremity is 4/5, Extremities without ischemic changes. Chronic venous stasis changes in lower legs with trace bilateral pitting edema, no ulcerations.  Neurologic: A&O X 3; Appropriate Affect, Speech is normal  CN 2-12 intact, Pain and light touch intact in extremities, Motor exam as listed above.                       Non-Invasive Vascular Imaging CAROTID DUPLEX 01/04/2016   CEREBROVASCULAR DUPLEX EVALUATION    INDICATION: Carotid disease    PREVIOUS INTERVENTION(S):     DUPLEX EXAM:     RIGHT  LEFT  Peak Systolic Velocities (cm/s) End Diastolic Velocities (cm/s) Plaque LOCATION Peak Systolic Velocities (cm/s) End Diastolic Velocities (cm/s) Plaque  80 15 HT CCA PROXIMAL 66 17   83 16 CP CCA MID 88 22 HT  117 28 HT CCA DISTAL 101 25 HT  118 15 CP ECA 110 12 CP  326 90 CP ICA PROXIMAL 155 34 CP  179 41  ICA MID 123 29    71 24  ICA DISTAL 92 25     Not Calculated ICA / CCA Ratio (PSV) 1.5  Antegrade Vertebral Flow Antegrade   Brachial Systolic Pressure (mmHg)   Multiphasic (subclavian artery) Brachial Artery Waveforms Multiphasic (subclavian artery)    Plaque Morphology:  HM = Homogeneous, HT = Heterogeneous, CP = Calcific Plaque, SP = Smooth Plaque, IP = Irregular Plaque     ADDITIONAL FINDINGS: . No significant stenosis of the left external or bilateral common carotid arteries. . Greater than 50% stenoses of the left external carotid artery noted.    IMPRESSION: Doppler velocities in the right internal carotid artery are consistent with a 60-79%  diameter reduction. Doppler velocities in the left internal carotid artery are consistent with a 40-59% diameter reduction.    Compared to the previous exam:  Velocities in the left internal carotid artery have increased while remaining within the same diagnostic category of stenosis when compared to the previous exam on 07/06/2015.       Assessment: Shelley [name] is a 75 y.o. female who has a history of a remote TIA, none subsequently.  Today's carotid duplex suggests 60-79% right ICA stenosis and 40-59% left ICA stenosis. Velocities in the left internal carotid artery have increased while remaining within the same diagnostic category of stenosis when compared to the previous exam on 07/06/2015.    Plan: Follow-up in 6 months with Carotid Duplex scan.  The patient was counseled re smoking cessation and given several free resources re smoking cessation.   I discussed in depth with the patient the nature of atherosclerosis, and emphasized the importance of maximal medical management including strict control of blood pressure, blood glucose, and lipid levels, obtaining regular exercise, and cessation of smoking.  The patient is aware that without maximal medical management the underlying atherosclerotic disease process will progress, limiting the benefit of any interventions. The patient was given information about stroke prevention and what symptoms should prompt the patient to seek immediate medical care. Thank you for allowing [place] to participate in this patient's care.  Clemon Chambers, RN, MSN, FNP-C [facility] Office: [phone]  Clinic Physician: Oneida Alar  01/04/2016 10:48 AM

## 2016-01-04 NOTE — Patient Instructions (Addendum)
Stroke Prevention Some medical conditions and behaviors are associated with an increased chance of having a stroke. You may prevent a stroke by making healthy choices and managing medical conditions. HOW CAN I REDUCE MY RISK OF HAVING A STROKE?   Stay physically active. Get at least 30 minutes of activity on most or all days.  Do not smoke. It may also be helpful to avoid exposure to secondhand smoke.  Limit alcohol use. Moderate alcohol use is considered to be:  No more than 2 drinks per day for men.  No more than 1 drink per day for nonpregnant women.  Eat healthy foods. This involves:  Eating 5 or more servings of fruits and vegetables a day.  Making dietary changes that address high blood pressure (hypertension), high cholesterol, diabetes, or obesity.  Manage your cholesterol levels.  Making food choices that are high in fiber and low in saturated fat, trans fat, and cholesterol may control cholesterol levels.  Take any prescribed medicines to control cholesterol as directed by your health care provider.  Manage your diabetes.  Controlling your carbohydrate and sugar intake is recommended to manage diabetes.  Take any prescribed medicines to control diabetes as directed by your health care provider.  Control your hypertension.  Making food choices that are low in salt (sodium), saturated fat, trans fat, and cholesterol is recommended to manage hypertension.  Ask your health care provider if you need treatment to lower your blood pressure. Take any prescribed medicines to control hypertension as directed by your health care provider.  If you are 18-39 years of age, have your blood pressure checked every 3-5 years. If you are 40 years of age or older, have your blood pressure checked every year.  Maintain a healthy weight.  Reducing calorie intake and making food choices that are low in sodium, saturated fat, trans fat, and cholesterol are recommended to manage  weight.  Stop drug abuse.  Avoid taking birth control pills.  Talk to your health care provider about the risks of taking birth control pills if you are over 35 years old, smoke, get migraines, or have ever had a blood clot.  Get evaluated for sleep disorders (sleep apnea).  Talk to your health care provider about getting a sleep evaluation if you snore a lot or have excessive sleepiness.  Take medicines only as directed by your health care provider.  For some people, aspirin or blood thinners (anticoagulants) are helpful in reducing the risk of forming abnormal blood clots that can lead to stroke. If you have the irregular heart rhythm of atrial fibrillation, you should be on a blood thinner unless there is a good reason you cannot take them.  Understand all your medicine instructions.  Make sure that other conditions (such as anemia or atherosclerosis) are addressed. SEEK IMMEDIATE MEDICAL CARE IF:   You have sudden weakness or numbness of the face, arm, or leg, especially on one side of the body.  Your face or eyelid droops to one side.  You have sudden confusion.  You have trouble speaking (aphasia) or understanding.  You have sudden trouble seeing in one or both eyes.  You have sudden trouble walking.  You have dizziness.  You have a loss of balance or coordination.  You have a sudden, severe headache with no known cause.  You have new chest pain or an irregular heartbeat. Any of these symptoms may represent a serious problem that is an emergency. Do not wait to see if the symptoms will   go away. Get medical help at once. Call your local emergency services (911 in [place] Do not drive yourself to the hospital.   This information is not intended to replace advice given to you by your health care provider. Make sure you discuss any questions you have with your health care provider.   Document Released: 09/19/2004 Document Revised: 09/02/2014 Document Reviewed:  02/12/2013 Elsevier Interactive Patient Education 2016 [employer].     Steps to Quit Smoking  Smoking tobacco can be harmful to your health and can affect almost every organ in your body. Smoking puts you, and those around you, at risk for developing many serious chronic diseases. Quitting smoking is difficult, but it is one of the best things that you can do for your health. It is never too late to quit. WHAT ARE THE BENEFITS OF QUITTING SMOKING? When you quit smoking, you lower your risk of developing serious diseases and conditions, such as:  Lung cancer or lung disease, such as COPD.  Heart disease.  Stroke.  Heart attack.  Infertility.  Osteoporosis and bone fractures. Additionally, symptoms such as coughing, wheezing, and shortness of breath may get better when you quit. You may also find that you get sick less often because your body is stronger at fighting off colds and infections. If you are pregnant, quitting smoking can help to reduce your chances of having a baby of low birth weight. HOW DO I GET READY TO QUIT? When you decide to quit smoking, create a plan to make sure that you are successful. Before you quit:  Pick a date to quit. Set a date within the next two weeks to give you time to prepare.  Write down the reasons why you are quitting. Keep this list in places where you will see it often, such as on your bathroom mirror or in your car or wallet.  Identify the people, places, things, and activities that make you want to smoke (triggers) and avoid them. Make sure to take these actions:  Throw away all cigarettes at home, at work, and in your car.  Throw away smoking accessories, such as Scientist, research (medical).  Clean your car and make sure to empty the ashtray.  Clean your home, including curtains and carpets.  Tell your family, friends, and coworkers that you are quitting. Support from your loved ones can make quitting easier.  Talk with your health care  provider about your options for quitting smoking.  Find out what treatment options are covered by your health insurance. WHAT STRATEGIES CAN I USE TO QUIT SMOKING?  Talk with your healthcare provider about different strategies to quit smoking. Some strategies include:  Quitting smoking altogether instead of gradually lessening how much you smoke over a period of time. Research shows that quitting "cold [place]" is more successful than gradually quitting.  Attending in-person counseling to help you build problem-solving skills. You are more likely to have success in quitting if you attend several counseling sessions. Even short sessions of 10 minutes can be effective.  Finding resources and support systems that can help you to quit smoking and remain smoke-free after you quit. These resources are most helpful when you use them often. They can include:  Online chats with a Social worker.  Telephone quitlines.  Printed Furniture conservator/restorer.  Support groups or group counseling.  Text messaging programs.  Mobile phone applications.  Taking medicines to help you quit smoking. (If you are pregnant or breastfeeding, talk with your health care provider first.) Some  medicines contain nicotine and some do not. Both types of medicines help with cravings, but the medicines that include nicotine help to relieve withdrawal symptoms. Your health care provider may recommend:  Nicotine patches, gum, or lozenges.  Nicotine inhalers or sprays.  Non-nicotine medicine that is taken by mouth. Talk with your health care provider about combining strategies, such as taking medicines while you are also receiving in-person counseling. Using these two strategies together makes you more likely to succeed in quitting than if you used either strategy on its own. If you are pregnant or breastfeeding, talk with your health care provider about finding counseling or other support strategies to quit smoking. Do not take  medicine to help you quit smoking unless told to do so by your health care provider. WHAT THINGS CAN I DO TO MAKE IT EASIER TO QUIT? Quitting smoking might feel overwhelming at first, but there is a lot that you can do to make it easier. Take these important actions:  Reach out to your family and friends and ask that they support and encourage you during this time. Call telephone quitlines, reach out to support groups, or work with a counselor for support.  Ask people who smoke to avoid smoking around you.  Avoid places that trigger you to smoke, such as bars, parties, or smoke-break areas at work.  Spend time around people who do not smoke.  Lessen stress in your life, because stress can be a smoking trigger for some people. To lessen stress, try:  Exercising regularly.  Deep-breathing exercises.  Yoga.  Meditating.  Performing a body scan. This involves closing your eyes, scanning your body from head to toe, and noticing which parts of your body are particularly tense. Purposefully relax the muscles in those areas.  Download or purchase mobile phone or tablet apps (applications) that can help you stick to your quit plan by providing reminders, tips, and encouragement. There are many free apps, such as QuitGuide from the [employer] Office manager for Disease Control and Prevention). You can find other support for quitting smoking (smoking cessation) through smokefree.gov and other websites. HOW WILL I FEEL WHEN I QUIT SMOKING? Within the first 24 hours of quitting smoking, you may start to feel some withdrawal symptoms. These symptoms are usually most noticeable 2-3 days after quitting, but they usually do not last beyond 2-3 weeks. Changes or symptoms that you might experience include:  Mood swings.  Restlessness, anxiety, or irritation.  Difficulty concentrating.  Dizziness.  Strong cravings for sugary foods in addition to nicotine.  Mild weight  gain.  Constipation.  Nausea.  Coughing or a sore throat.  Changes in how your medicines work in your body.  A depressed mood.  Difficulty sleeping (insomnia). After the first 2-3 weeks of quitting, you may start to notice more positive results, such as:  Improved sense of smell and taste.  Decreased coughing and sore throat.  Slower heart rate.  Lower blood pressure.  Clearer skin.  The ability to breathe more easily.  Fewer sick days. Quitting smoking is very challenging for most people. Do not get discouraged if you are not successful the first time. Some people need to make many attempts to quit before they achieve long-term success. Do your best to stick to your quit plan, and talk with your health care provider if you have any questions or concerns.   This information is not intended to replace advice given to you by your health care provider. Make sure you discuss any questions  you have with your health care provider.   Document Released: 08/06/2001 Document Revised: 12/27/2014 Document Reviewed: 12/27/2014 Elsevier Interactive Patient Education [employer].

## 2016-01-10 ENCOUNTER — Encounter: Payer: Self-pay | Admitting: Cardiology

## 2016-01-10 ENCOUNTER — Ambulatory Visit (INDEPENDENT_AMBULATORY_CARE_PROVIDER_SITE_OTHER): Payer: PPO | Admitting: Cardiology

## 2016-01-10 VITALS — BP 120/62 | HR 64 | Ht 65.0 in | Wt 214.0 lb

## 2016-01-10 DIAGNOSIS — I10 Essential (primary) hypertension: Secondary | ICD-10-CM

## 2016-01-10 DIAGNOSIS — I2510 Atherosclerotic heart disease of native coronary artery without angina pectoris: Secondary | ICD-10-CM

## 2016-01-10 DIAGNOSIS — I6523 Occlusion and stenosis of bilateral carotid arteries: Secondary | ICD-10-CM | POA: Diagnosis not present

## 2016-01-10 DIAGNOSIS — E782 Mixed hyperlipidemia: Secondary | ICD-10-CM

## 2016-01-10 NOTE — Patient Instructions (Signed)
Your physician recommends that you continue on your current medications as directed. Please refer to the Current Medication list given to you today. Your physician recommends that you schedule a follow-up appointment in: 6 months. You will receive a reminder letter in the mail in about 4 months reminding you to call and schedule your appointment. If you don't receive this letter, please contact our office.

## 2016-01-10 NOTE — Progress Notes (Signed)
Cardiology Office Note  Date: 01/10/2016   ID: Shelley [name], DOB 11-28-1940, MRN [id number]  PCP: Deloria Lair, MD  Primary Cardiologist: Rozann Lesches, MD   Chief Complaint  Patient presents with  . Coronary Artery Disease    History of Present Illness: Shelley [name] is a 75 y.o. female last seen in November 2016. She presents for a routine follow-up visit. Reports no progressive angina symptoms. She has had some recent functional limitations related to a fall and great toe injury. She just finished wearing a protective boot. She uses a cane.  She had a follow-up Cardiolite study done in December 2016, results outlined below. We are continuing medical therapy and observation for now.  Recent lab work from March reviewed, LDL was 85. Hemoglobin A1c only 5.4.  Current cardiac medications include aspirin, Plavix, Toprol-XL, Crestor, omega-3 supplements, and as needed nitroglycerin.  Past Medical History  Diagnosis Date  . COPD (chronic obstructive pulmonary disease) ([place])   . Osteoarthritis   . Sinus polyp   . Mixed hyperlipidemia   . Regional enteritis of large intestine ([place])   . Pancreatitis   . Rectal bleeding   . Hepatic steatosis   . History of oral aphthous ulcers   . Essential hypertension, benign   . TIA (transient ischemic attack)   . Hx of adenomatous colonic polyps   . Carotid artery disease ([place])     42-59% RICA and LICA 5/63 - Dr. Oneida Alar  . Coronary atherosclerosis of native coronary artery     BMS to RCA 2004  . Myocardial infarction ([place])     IMI 10/04  . History of Salmonella gastroenteritis   . History of melanoma   . Anxiety   . Depression   . Cataract   . Osteoporosis   . Tubular adenoma of colon 08/2012  . DVT (deep venous thrombosis) ([place])   . Fracture Left Great Toe    Past Surgical History  Procedure Laterality Date  . Melanoma resection  1996  . Cataract extraction Left   . Eye surgery    . Total knee arthroplasty Right  Feb 2012    Current Outpatient Prescriptions  Medication Sig Dispense Refill  . aspirin 81 MG tablet Take 81 mg by mouth daily.      . Cholecalciferol (VITAMIN D3) 1000 UNITS CAPS Take 1 capsule by mouth daily.    . clopidogrel (PLAVIX) 75 MG tablet Take 1 tablet (75 mg total) by mouth daily. 30 tablet 6  . dicyclomine (BENTYL) 10 MG capsule Take 1 capsule (10 mg total) by mouth as needed for spasms. 30 capsule 3  . Fexofenadine HCl (ALLEGRA ALLERGY PO) Take 1 tablet by mouth as needed. Reported on 01/04/2016    . FLUoxetine (PROZAC) 40 MG capsule Take 40 mg by mouth daily.    . folic acid (FOLVITE) 1 MG tablet Take 1 tablet (1 mg total) by mouth daily. 100 tablet 11  . HYDROcodone-acetaminophen (LORCET) 10-650 MG per tablet Take 1 tablet by mouth every 12 (twelve) hours as needed.      . metoprolol succinate (TOPROL-XL) 25 MG 24 hr tablet Take 1 tablet (25 mg total) by mouth daily. 60 tablet 3  . nitroGLYCERIN (NITROSTAT) 0.4 MG SL tablet Place 1 tablet (0.4 mg total) under the tongue every 5 (five) minutes x 3 doses as needed. 25 tablet 3  . Omega-3 Fatty Acids (FISH OIL) 1200 MG CAPS Take 1 capsule by mouth daily.      Marland Kitchen  rosuvastatin (CRESTOR) 10 MG tablet Take 1 tablet (10 mg total) by mouth daily. 90 tablet 3  . sulfaSALAzine (AZULFIDINE) 500 MG tablet Take 2 tablets (1,000 mg total) by mouth 2 (two) times daily. 120 tablet 11   No current facility-administered medications for this visit.   Allergies:  Mercaptopurine; Sulfa antibiotics; and Penicillins   Social History: The patient  reports that she has been smoking Cigarettes.  She started smoking about 58 years ago. She has a 10 pack-year smoking history. She has never used smokeless tobacco. She reports that she does not drink alcohol or use illicit drugs.   ROS:  Please see the history of present illness. Otherwise, complete review of systems is positive for arthritic stiffness.  All other systems are reviewed and negative.    Physical Exam: VS:  BP 120/62 mmHg  Pulse 64  Ht _0  (1.651 m)  Wt 214 lb (97.07 kg)  BMI 35.61 kg/m2  SpO2 94%, BMI Body mass index is 35.61 kg/(m^2).  Wt Readings from Last 3 Encounters:  01/10/16 214 lb (97.07 kg)  01/04/16 217 lb 3.2 oz (98.521 kg)  07/18/15 222 lb (100.699 kg)    Comfortable in no acute distress. Using a cane. HEENT: Conjuctivae and lids normal, oropharynx clear.  Neck: Supple, no elevated JVP, soft right carotid bruit, no thyromegaly or tenderness.  Lungs: Somewhat prolonged expiratory phase with scattered rhonchi, no frank wheezing. Nonlabored at rest. Cor: RRR, no pathologic systolic murmurs. No S3 or rub.  Ext: Mild edema and venous stasis. Distal pulses 1-2+.  Skin: Warm and dry.  Musculoskeletal: No gross deformities.  Neuropsychiatric: Alert and oriented x3, affect appropriate.  ECG: I personally reviewed the prior tracing from 07/18/2015 which showed sinus rhythm with evidence of old inferior infarct, decreased R wave progression anteriorly.  Recent Labwork:  March 2017: Cholesterol 143, triglycerides 86, HDL 71, LDL 85, hemoglobin A1c 5.4, BUN 19, creatinine 0.6, ALT 7, AST 13, potassium 5.2, hemoglobin 12.7, platelets 253  Other Studies Reviewed Today:  Carotid Dopplers11/05/2015: 60-79% RICA stenosis and 30-09% LICA stenosis.  Lexiscan Cardiolite 08/11/2015:  There was no ST segment deviation noted during stress.  Defect 1: There is a medium defect of moderate severity present in the basal inferoseptal, basal inferior, mid inferior and apical inferior location.  Findings consistent with prior myocardial infarction.  This is a low risk study.  Nuclear stress EF: 52%. Inferior wall hypokinesis noted.  Assessment and Plan:  1. Symptomatically stable CAD status post BMS to RCA in 2004. Follow-up Cardiolite study from December 2016 is outlined above suggesting scar in the inferior wall but no substantial degree of ischemia. We  will continue medical therapy and observation and less symptoms escalate.  2. Hyperlipidemia, on Crestor. Recent LDL 85.  3. Essential hypertension, blood pressure is well controlled today. She continues on Toprol-XL  4. Carotid artery disease, followed by Dr. Oneida Alar. Continues on antiplatelet regimen and statin.  Current medicines were reviewed with the patient today.   Disposition: FU with me in 6 months.   Signed, Satira Sark, MD, [facility] 01/10/2016 10:54 AM    [facility] [place], [place], [place] [address] Phone: [phone]; Fax: [phone]

## 2016-01-31 DIAGNOSIS — M4806 Spinal stenosis, lumbar region: Secondary | ICD-10-CM | POA: Diagnosis not present

## 2016-01-31 DIAGNOSIS — M170 Bilateral primary osteoarthritis of knee: Secondary | ICD-10-CM | POA: Diagnosis not present

## 2016-01-31 DIAGNOSIS — M545 Low back pain: Secondary | ICD-10-CM | POA: Diagnosis not present

## 2016-01-31 DIAGNOSIS — M47817 Spondylosis without myelopathy or radiculopathy, lumbosacral region: Secondary | ICD-10-CM | POA: Diagnosis not present

## 2016-02-12 NOTE — Addendum Note (Signed)
Addended by: Mena Goes on: 02/12/2016 04:47 PM   Modules accepted: Orders

## 2016-02-13 DIAGNOSIS — M4806 Spinal stenosis, lumbar region: Secondary | ICD-10-CM | POA: Diagnosis not present

## 2016-02-28 DIAGNOSIS — M545 Low back pain: Secondary | ICD-10-CM | POA: Diagnosis not present

## 2016-02-28 DIAGNOSIS — M170 Bilateral primary osteoarthritis of knee: Secondary | ICD-10-CM | POA: Diagnosis not present

## 2016-02-28 DIAGNOSIS — M25561 Pain in right knee: Secondary | ICD-10-CM | POA: Diagnosis not present

## 2016-02-29 ENCOUNTER — Other Ambulatory Visit (HOSPITAL_COMMUNITY): Payer: Self-pay | Admitting: Orthopaedic Surgery

## 2016-02-29 DIAGNOSIS — M545 Low back pain: Secondary | ICD-10-CM

## 2016-03-12 ENCOUNTER — Ambulatory Visit (HOSPITAL_COMMUNITY)
Admission: RE | Admit: 2016-03-12 | Discharge: 2016-03-12 | Disposition: A | Discharge status: Home / Self Care | Payer: PPO | Source: Ambulatory Visit | Attending: Orthopaedic Surgery | Admitting: Orthopaedic Surgery

## 2016-03-12 DIAGNOSIS — M545 Low back pain: Secondary | ICD-10-CM | POA: Insufficient documentation

## 2016-03-12 DIAGNOSIS — M4806 Spinal stenosis, lumbar region: Secondary | ICD-10-CM | POA: Insufficient documentation

## 2016-03-12 DIAGNOSIS — M5126 Other intervertebral disc displacement, lumbar region: Secondary | ICD-10-CM | POA: Diagnosis not present

## 2016-03-12 DIAGNOSIS — M5136 Other intervertebral disc degeneration, lumbar region: Secondary | ICD-10-CM | POA: Insufficient documentation

## 2016-03-12 DIAGNOSIS — M2578 Osteophyte, vertebrae: Secondary | ICD-10-CM | POA: Diagnosis not present

## 2016-03-18 ENCOUNTER — Other Ambulatory Visit: Payer: Self-pay | Admitting: *Deleted

## 2016-03-18 MED ORDER — METOPROLOL SUCCINATE ER 25 MG PO TB24
25.0000 mg | ORAL_TABLET | Freq: Every day | ORAL | 3 refills | Status: DC
Start: 2016-03-18 — End: 2017-03-18

## 2016-03-20 DIAGNOSIS — M545 Low back pain: Secondary | ICD-10-CM | POA: Diagnosis not present

## 2016-03-20 DIAGNOSIS — M25551 Pain in right hip: Secondary | ICD-10-CM | POA: Diagnosis not present

## 2016-03-27 DIAGNOSIS — Z803 Family history of malignant neoplasm of breast: Secondary | ICD-10-CM | POA: Diagnosis not present

## 2016-03-27 DIAGNOSIS — Z1231 Encounter for screening mammogram for malignant neoplasm of breast: Secondary | ICD-10-CM | POA: Diagnosis not present

## 2016-04-01 DIAGNOSIS — R262 Difficulty in walking, not elsewhere classified: Secondary | ICD-10-CM | POA: Diagnosis not present

## 2016-04-01 DIAGNOSIS — M6281 Muscle weakness (generalized): Secondary | ICD-10-CM | POA: Diagnosis not present

## 2016-04-03 DIAGNOSIS — Z6835 Body mass index (BMI) 35.0-35.9, adult: Secondary | ICD-10-CM | POA: Diagnosis not present

## 2016-04-03 DIAGNOSIS — R7301 Impaired fasting glucose: Secondary | ICD-10-CM | POA: Diagnosis not present

## 2016-04-03 DIAGNOSIS — R2689 Other abnormalities of gait and mobility: Secondary | ICD-10-CM | POA: Diagnosis not present

## 2016-04-03 DIAGNOSIS — M5136 Other intervertebral disc degeneration, lumbar region: Secondary | ICD-10-CM | POA: Diagnosis not present

## 2016-04-03 DIAGNOSIS — I10 Essential (primary) hypertension: Secondary | ICD-10-CM | POA: Diagnosis not present

## 2016-04-03 DIAGNOSIS — R296 Repeated falls: Secondary | ICD-10-CM | POA: Diagnosis not present

## 2016-04-08 DIAGNOSIS — M6281 Muscle weakness (generalized): Secondary | ICD-10-CM | POA: Diagnosis not present

## 2016-04-08 DIAGNOSIS — R262 Difficulty in walking, not elsewhere classified: Secondary | ICD-10-CM | POA: Diagnosis not present

## 2016-04-12 DIAGNOSIS — R262 Difficulty in walking, not elsewhere classified: Secondary | ICD-10-CM | POA: Diagnosis not present

## 2016-04-12 DIAGNOSIS — M6281 Muscle weakness (generalized): Secondary | ICD-10-CM | POA: Diagnosis not present

## 2016-04-16 DIAGNOSIS — R262 Difficulty in walking, not elsewhere classified: Secondary | ICD-10-CM | POA: Diagnosis not present

## 2016-04-16 DIAGNOSIS — M6281 Muscle weakness (generalized): Secondary | ICD-10-CM | POA: Diagnosis not present

## 2016-04-17 DIAGNOSIS — M25551 Pain in right hip: Secondary | ICD-10-CM | POA: Diagnosis not present

## 2016-04-17 DIAGNOSIS — M545 Low back pain: Secondary | ICD-10-CM | POA: Diagnosis not present

## 2016-04-18 DIAGNOSIS — M6281 Muscle weakness (generalized): Secondary | ICD-10-CM | POA: Diagnosis not present

## 2016-04-18 DIAGNOSIS — R262 Difficulty in walking, not elsewhere classified: Secondary | ICD-10-CM | POA: Diagnosis not present

## 2016-04-23 DIAGNOSIS — R262 Difficulty in walking, not elsewhere classified: Secondary | ICD-10-CM | POA: Diagnosis not present

## 2016-04-23 DIAGNOSIS — M6281 Muscle weakness (generalized): Secondary | ICD-10-CM | POA: Diagnosis not present

## 2016-04-25 DIAGNOSIS — R262 Difficulty in walking, not elsewhere classified: Secondary | ICD-10-CM | POA: Diagnosis not present

## 2016-04-25 DIAGNOSIS — M6281 Muscle weakness (generalized): Secondary | ICD-10-CM | POA: Diagnosis not present

## 2016-04-30 DIAGNOSIS — M6281 Muscle weakness (generalized): Secondary | ICD-10-CM | POA: Diagnosis not present

## 2016-04-30 DIAGNOSIS — R262 Difficulty in walking, not elsewhere classified: Secondary | ICD-10-CM | POA: Diagnosis not present

## 2016-05-01 DIAGNOSIS — H353131 Nonexudative age-related macular degeneration, bilateral, early dry stage: Secondary | ICD-10-CM | POA: Diagnosis not present

## 2016-05-01 DIAGNOSIS — H43813 Vitreous degeneration, bilateral: Secondary | ICD-10-CM | POA: Diagnosis not present

## 2016-05-01 DIAGNOSIS — Z961 Presence of intraocular lens: Secondary | ICD-10-CM | POA: Diagnosis not present

## 2016-05-01 DIAGNOSIS — H35363 Drusen (degenerative) of macula, bilateral: Secondary | ICD-10-CM | POA: Diagnosis not present

## 2016-05-02 DIAGNOSIS — R262 Difficulty in walking, not elsewhere classified: Secondary | ICD-10-CM | POA: Diagnosis not present

## 2016-05-02 DIAGNOSIS — M6281 Muscle weakness (generalized): Secondary | ICD-10-CM | POA: Diagnosis not present

## 2016-05-07 DIAGNOSIS — R262 Difficulty in walking, not elsewhere classified: Secondary | ICD-10-CM | POA: Diagnosis not present

## 2016-05-07 DIAGNOSIS — M6281 Muscle weakness (generalized): Secondary | ICD-10-CM | POA: Diagnosis not present

## 2016-05-08 DIAGNOSIS — M545 Low back pain: Secondary | ICD-10-CM | POA: Diagnosis not present

## 2016-05-08 DIAGNOSIS — M1712 Unilateral primary osteoarthritis, left knee: Secondary | ICD-10-CM | POA: Diagnosis not present

## 2016-05-09 DIAGNOSIS — M6281 Muscle weakness (generalized): Secondary | ICD-10-CM | POA: Diagnosis not present

## 2016-05-09 DIAGNOSIS — R262 Difficulty in walking, not elsewhere classified: Secondary | ICD-10-CM | POA: Diagnosis not present

## 2016-05-16 DIAGNOSIS — S81802A Unspecified open wound, left lower leg, initial encounter: Secondary | ICD-10-CM | POA: Diagnosis not present

## 2016-05-24 DIAGNOSIS — R2689 Other abnormalities of gait and mobility: Secondary | ICD-10-CM | POA: Diagnosis not present

## 2016-07-03 ENCOUNTER — Telehealth: Payer: Self-pay | Admitting: Gastroenterology

## 2016-07-03 MED ORDER — SULFASALAZINE 500 MG PO TABS: 1000.0000 mg | ORAL_TABLET | Freq: Two times a day (BID) | ORAL | 2 refills | Status: DC

## 2016-07-03 NOTE — Telephone Encounter (Signed)
Sent in refills for patient enough for three months but explained to patient that she needed a yearly follow up appointment scheduled for any further refills    She stated she would call back the first of the year to schedule

## 2016-07-04 DIAGNOSIS — B372 Candidiasis of skin and nail: Secondary | ICD-10-CM | POA: Diagnosis not present

## 2016-07-04 DIAGNOSIS — Z1283 Encounter for screening for malignant neoplasm of skin: Secondary | ICD-10-CM | POA: Diagnosis not present

## 2016-07-05 ENCOUNTER — Encounter: Payer: Self-pay | Admitting: Family

## 2016-07-11 ENCOUNTER — Ambulatory Visit (INDEPENDENT_AMBULATORY_CARE_PROVIDER_SITE_OTHER): Payer: PPO | Admitting: Family

## 2016-07-11 ENCOUNTER — Ambulatory Visit (HOSPITAL_COMMUNITY)
Admission: RE | Admit: 2016-07-11 | Discharge: 2016-07-11 | Disposition: A | Discharge status: Home / Self Care | Payer: PPO | Source: Ambulatory Visit | Attending: Family | Admitting: Family

## 2016-07-11 ENCOUNTER — Encounter: Payer: Self-pay | Admitting: Family

## 2016-07-11 VITALS — BP 124/65 | HR 57 | Temp 98.1°F | Resp 16 | Ht 65.0 in | Wt 210.0 lb

## 2016-07-11 DIAGNOSIS — Z8673 Personal history of transient ischemic attack (TIA), and cerebral infarction without residual deficits: Secondary | ICD-10-CM

## 2016-07-11 DIAGNOSIS — I6523 Occlusion and stenosis of bilateral carotid arteries: Secondary | ICD-10-CM | POA: Insufficient documentation

## 2016-07-11 DIAGNOSIS — F17200 Nicotine dependence, unspecified, uncomplicated: Secondary | ICD-10-CM | POA: Diagnosis not present

## 2016-07-11 NOTE — Progress Notes (Signed)
Chief Complaint: Follow up Extracranial Carotid Artery Stenosis   History of Present Illness  Shelley [name] is a 75 y.o. female patient of Dr. Oneida Alar with a history of carotid artery stenosis and no history of carotid intervention.  She returns for surveillance.  The patient does have a history of MI and 2 cardiac stents placed in 2004 and TIA symptoms many years ago manifested as transient right arm weakness, but denies history of stroke.  She has had no further stroke or TIA symptoms.  The patient denies a history of amaurosis fugax or monocular blindness, unilateral facial drooping, or receptive or expressive aphasia. ..  She denies current hemiplegia.  Patient denies non-healing wounds.   She has fallen a few times as her legs seem to give way, states this is due to her knee and back issues. Wearing a left knee brace as measure to help severe left knee OA, does not want knee replacement.  She lives in a 3 story house and climbs stairs several times/day, but her ambulation is otherwise limited by arthritis and pain from lumbar spine problems: DDD, HNP, arthritis, left leg sciatica.   She is receiving physical therapy due to frequent falls.   Pt denies any more dyspnea than usual, denies chest pain, states she has a smoker's cough, but has been bothered by more cough this Spring.  Pt Diabetic: No, she is checked every 6 months, last A1C was 5.5, per pt  Pt smoker: smoker (decreased to 1-3 cigarette/day, started in high school, quit several times in these yrs)   Pt meds include:  Statin : no, stopped to see if her legs strength would improve, has not improved yet in the 2-3 weeks that she stopped  Betablocker: Yes  ASA: Yes  Other anticoagulants/antiplatelets: Plavix    Past Medical History:  Diagnosis Date  . Anxiety   . Carotid artery disease ([place])    56-31% RICA and LICA 4/97 - Dr. Oneida Alar  . Cataract   . COPD (chronic obstructive pulmonary disease)  ([place])   . Coronary atherosclerosis of native coronary artery    BMS to RCA 2004  . Depression   . DVT (deep venous thrombosis) ([place])   . Essential hypertension, benign   . Fracture Left Great Toe  . Hepatic steatosis   . History of melanoma   . History of oral aphthous ulcers   . History of Salmonella gastroenteritis   . Hx of adenomatous colonic polyps   . Mixed hyperlipidemia   . Myocardial infarction    IMI 10/04  . Osteoarthritis   . Osteoporosis   . Pancreatitis   . Rectal bleeding   . Regional enteritis of large intestine ([place])   . Sinus polyp   . TIA (transient ischemic attack)   . Tubular adenoma of colon 08/2012    Social History Social History  Substance Use Topics  . Smoking status: Light Tobacco Smoker    Packs/day: 0.25    Years: 40.00    Types: Cigarettes    Start date: 06/02/1957  . Smokeless tobacco: Never Used     Comment: trying to quit 1/2 pack day - so much stress in her lift  . Alcohol use No    Family History Family History  Problem Relation Age of Onset  . Diabetes type II Mother   . Heart attack Mother 13    questionable  . Uterine cancer Mother   . Diabetes Mother   . Heart disease Mother  before age 44  . Cancer Mother   . Varicose Veins Mother   . Colon cancer Neg Hx   . Other Brother     PVD and hx DVT  . Deep vein thrombosis Brother   . Breast cancer Sister   . Cancer Sister     Breast  . Hyperlipidemia Sister   . Hypertension Sister     Surgical History Past Surgical History:  Procedure Laterality Date  . CATARACT EXTRACTION Left   . EYE SURGERY    . Melanoma resection  1996  . TOTAL KNEE ARTHROPLASTY Right Feb 2012    Allergies  Allergen Reactions  . Mercaptopurine     REACTION: pancreatitis  . Sulfa Antibiotics   . Penicillins Rash    REACTION: rash    Current Outpatient Prescriptions  Medication Sig Dispense Refill  . aspirin 81 MG tablet Take 81 mg by mouth daily.      . Cholecalciferol (VITAMIN D3)  1000 UNITS CAPS Take 1 capsule by mouth daily.    . clopidogrel (PLAVIX) 75 MG tablet Take 1 tablet (75 mg total) by mouth daily. 30 tablet 6  . dicyclomine (BENTYL) 10 MG capsule Take 1 capsule (10 mg total) by mouth as needed for spasms. 30 capsule 3  . Fexofenadine HCl (ALLEGRA ALLERGY PO) Take 1 tablet by mouth as needed. Reported on 01/04/2016    . FLUoxetine (PROZAC) 40 MG capsule Take 40 mg by mouth daily.    . folic acid (FOLVITE) 1 MG tablet Take 1 tablet (1 mg total) by mouth daily. 100 tablet 11  . HYDROcodone-acetaminophen (LORCET) 10-650 MG per tablet Take 1 tablet by mouth every 12 (twelve) hours as needed.      . metoprolol succinate (TOPROL-XL) 25 MG 24 hr tablet Take 1 tablet (25 mg total) by mouth daily. 90 tablet 3  . nitroGLYCERIN (NITROSTAT) 0.4 MG SL tablet Place 1 tablet (0.4 mg total) under the tongue every 5 (five) minutes x 3 doses as needed. 25 tablet 3  . Omega-3 Fatty Acids (FISH OIL) 1200 MG CAPS Take 1 capsule by mouth daily.      Marland Kitchen sulfaSALAzine (AZULFIDINE) 500 MG tablet Take 2 tablets (1,000 mg total) by mouth 2 (two) times daily. 120 tablet 2  . rosuvastatin (CRESTOR) 10 MG tablet Take 1 tablet (10 mg total) by mouth daily. (Patient not taking: Reported on 07/11/2016) 90 tablet 3   No current facility-administered medications for this visit.     Review of Systems : See HPI for pertinent positives and negatives.  Physical Examination  Vitals:   07/11/16 1201 07/11/16 1203  BP: 116/61 124/65  Pulse: (!) 56 (!) 57  Resp: 16   Temp: 98.1 F (36.7 C)   SpO2: 94%   Weight: 210 lb (95.3 kg)   Height: _0  (1.651 m)    Body mass index is 34.95 kg/m.  General: WDWN obese female in NAD. GAIT: uses cane, antalgic.  Eyes: PERRLA  Pulmonary: no dyspnea, limited air movement in all fields, + rales in both bases, no rhonchi, few wheezes.  Cardiac: regular rhythm, no detected murmur.  VASCULAR EXAM  Carotid Bruits  Left  Right     Negative  positive  Aorta is not palpable.  Radial pulses are 2+ palpable and equal.   LE Pulses  LEFT  RIGHT   POPLITEAL  not palpable  not palpable   POSTERIOR TIBIAL  not palpable  not palpable   DORSALIS PEDIS  ANTERIOR TIBIAL  palpable  palpable    Gastrointestinal: soft, nontender, BS WNL, no r/g, no palpated masses.  Musculoskeletal: No muscle atrophy/wasting. M/S 5/5 throughout except left lower extremity is 4/5, Extremities without ischemic changes. Chronic venous stasis changes in lower legs with trace bilateral pitting edema, no ulcerations.  Neurologic: A&O X 3; Appropriate Affect, Speech is normal  CN 2-12 intact, Pain and light touch intact in extremities, Motor exam as listed above.     Assessment: Shelley [name] is a 75 y.o. female who has a history of a remote TIA, none subsequently.   Her atherosclerotic risk factors include current smoking (since in her teens), CAD, COPD, and obesity.   DATA Today's carotid duplex suggests 60-79% right ICA stenosis and 40-59% left ICA stenosis. Bilateral vertebral artery flow is antegrade. Bilateral subclavian artery waveforms are normal. No significant change compared to the last exam on 01-04-16.   Plan: Follow-up in 6 months with Carotid Duplex scan.   I discussed in depth with the patient the nature of atherosclerosis, and emphasized the importance of maximal medical management including strict control of blood pressure, blood glucose, and lipid levels, obtaining regular exercise, and cessation of smoking.  The patient is aware that without maximal medical management the underlying atherosclerotic disease process will progress, limiting the benefit of any interventions. The patient was given information about stroke prevention and what symptoms should prompt the patient to seek immediate medical care. Thank you for allowing [place] to participate in this patient's  care.  Clemon Chambers, RN, MSN, FNP-C [facility] Office: [phone]  Clinic Physician: Oneida Alar  07/11/16 12:12 PM

## 2016-07-11 NOTE — Patient Instructions (Signed)
Stroke Prevention Some medical conditions and behaviors are associated with an increased chance of having a stroke. You may prevent a stroke by making healthy choices and managing medical conditions. How can I reduce my risk of having a stroke?  Stay physically active. Get at least 30 minutes of activity on most or all days.  Do not smoke. It may also be helpful to avoid exposure to secondhand smoke.  Limit alcohol use. Moderate alcohol use is considered to be:  No more than 2 drinks per day for men.  No more than 1 drink per day for nonpregnant women.  Eat healthy foods. This involves:  Eating 5 or more servings of fruits and vegetables a day.  Making dietary changes that address high blood pressure (hypertension), high cholesterol, diabetes, or obesity.  Manage your cholesterol levels.  Making food choices that are high in fiber and low in saturated fat, trans fat, and cholesterol may control cholesterol levels.  Take any prescribed medicines to control cholesterol as directed by your health care provider.  Manage your diabetes.  Controlling your carbohydrate and sugar intake is recommended to manage diabetes.  Take any prescribed medicines to control diabetes as directed by your health care provider.  Control your hypertension.  Making food choices that are low in salt (sodium), saturated fat, trans fat, and cholesterol is recommended to manage hypertension.  Ask your health care provider if you need treatment to lower your blood pressure. Take any prescribed medicines to control hypertension as directed by your health care provider.  If you are 18-39 years of age, have your blood pressure checked every 3-5 years. If you are 40 years of age or older, have your blood pressure checked every year.  Maintain a healthy weight.  Reducing calorie intake and making food choices that are low in sodium, saturated fat, trans fat, and cholesterol are recommended to manage  weight.  Stop drug abuse.  Avoid taking birth control pills.  Talk to your health care provider about the risks of taking birth control pills if you are over 35 years old, smoke, get migraines, or have ever had a blood clot.  Get evaluated for sleep disorders (sleep apnea).  Talk to your health care provider about getting a sleep evaluation if you snore a lot or have excessive sleepiness.  Take medicines only as directed by your health care provider.  For some people, aspirin or blood thinners (anticoagulants) are helpful in reducing the risk of forming abnormal blood clots that can lead to stroke. If you have the irregular heart rhythm of atrial fibrillation, you should be on a blood thinner unless there is a good reason you cannot take them.  Understand all your medicine instructions.  Make sure that other conditions (such as anemia or atherosclerosis) are addressed. Get help right away if:  You have sudden weakness or numbness of the face, arm, or leg, especially on one side of the body.  Your face or eyelid droops to one side.  You have sudden confusion.  You have trouble speaking (aphasia) or understanding.  You have sudden trouble seeing in one or both eyes.  You have sudden trouble walking.  You have dizziness.  You have a loss of balance or coordination.  You have a sudden, severe headache with no known cause.  You have new chest pain or an irregular heartbeat. Any of these symptoms may represent a serious problem that is an emergency. Do not wait to see if the symptoms will go away.   Get medical help at once. Call your local emergency services (911 in [place] Do not drive yourself to the hospital.  This information is not intended to replace advice given to you by your health care provider. Make sure you discuss any questions you have with your health care provider. Document Released: 09/19/2004 Document Revised: 01/18/2016 Document Reviewed: 02/12/2013 Elsevier  Interactive Patient Education  2017 [employer].     Steps to Quit Smoking Smoking tobacco can be bad for your health. It can also affect almost every organ in your body. Smoking puts you and people around you at risk for many serious long-lasting (chronic) diseases. Quitting smoking is hard, but it is one of the best things that you can do for your health. It is never too late to quit. What are the benefits of quitting smoking? When you quit smoking, you lower your risk for getting serious diseases and conditions. They can include:  Lung cancer or lung disease.  Heart disease.  Stroke.  Heart attack.  Not being able to have children (infertility).  Weak bones (osteoporosis) and broken bones (fractures). If you have coughing, wheezing, and shortness of breath, those symptoms may get better when you quit. You may also get sick less often. If you are pregnant, quitting smoking can help to lower your chances of having a baby of low birth weight. What can I do to help me quit smoking? Talk with your doctor about what can help you quit smoking. Some things you can do (strategies) include:  Quitting smoking totally, instead of slowly cutting back how much you smoke over a period of time.  Going to in-person counseling. You are more likely to quit if you go to many counseling sessions.  Using resources and support systems, such as:  Online chats with a Social worker.  Phone quitlines.  Printed Furniture conservator/restorer.  Support groups or group counseling.  Text messaging programs.  Mobile phone apps or applications.  Taking medicines. Some of these medicines may have nicotine in them. If you are pregnant or breastfeeding, do not take any medicines to quit smoking unless your doctor says it is okay. Talk with your doctor about counseling or other things that can help you. Talk with your doctor about using more than one strategy at the same time, such as taking medicines while you are  also going to in-person counseling. This can help make quitting easier. What things can I do to make it easier to quit? Quitting smoking might feel very hard at first, but there is a lot that you can do to make it easier. Take these steps:  Talk to your family and friends. Ask them to support and encourage you.  Call phone quitlines, reach out to support groups, or work with a Social worker.  Ask people who smoke to not smoke around you.  Avoid places that make you want (trigger) to smoke, such as:  Bars.  Parties.  Smoke-break areas at work.  Spend time with people who do not smoke.  Lower the stress in your life. Stress can make you want to smoke. Try these things to help your stress:  Getting regular exercise.  Deep-breathing exercises.  Yoga.  Meditating.  Doing a body scan. To do this, close your eyes, focus on one area of your body at a time from head to toe, and notice which parts of your body are tense. Try to relax the muscles in those areas.  Download or buy apps on your mobile phone or tablet  that can help you stick to your quit plan. There are many free apps, such as QuitGuide from the [employer] Office manager for Disease Control and Prevention). You can find more support from smokefree.gov and other websites. This information is not intended to replace advice given to you by your health care provider. Make sure you discuss any questions you have with your health care provider. Document Released: 06/08/2009 Document Revised: 04/09/2016 Document Reviewed: 12/27/2014 Elsevier Interactive Patient Education  2017 [employer].

## 2016-07-15 NOTE — Addendum Note (Signed)
Addended by: Lianne Cure A on: 07/15/2016 09:10 AM   Modules accepted: Orders

## 2016-08-05 NOTE — Progress Notes (Signed)
Cardiology Office Note  Date: 08/06/2016   ID: Shelley [name], DOB 1941/06/10, MRN [id number]  PCP: Deloria Lair, MD  Primary Cardiologist: Rozann Lesches, MD   Chief Complaint  Patient presents with  . Coronary Artery Disease    History of Present Illness: Shelley [name] is a 75 y.o. female last seen in May. She presents for a routine follow-up visit. No reported angina symptoms or nitroglycerine use. She is grieving the loss of her granddaughter who recently passed away with cystic fibrosis.  I reviewed her medications. She has stopped Crestor to see if it would help with her chronic leg pain. Not sure that that made a big difference. She has follow-up lipids scheduled with Dr. Scotty Court. Otherwise continues on aspirin, Plavix, Toprol-XL, and has as needed nitroglycerin.  She continues to follow with [facility] with history of carotid artery disease, most recent Dopplers are outlined below.  Stress testing from last year is outlined below. I reviewed her ECG today which shows sinus rhythm with left bundle branch block.  Past Medical History:  Diagnosis Date  . Anxiety   . Carotid artery disease ([place])    76-28% RICA and LICA 3/15 - Dr. Oneida Alar  . Cataract   . COPD (chronic obstructive pulmonary disease) ([place])   . Coronary atherosclerosis of native coronary artery    BMS to RCA 2004  . Depression   . DVT (deep venous thrombosis) ([place])   . Essential hypertension, benign   . Fracture Left Great Toe  . Hepatic steatosis   . History of melanoma   . History of oral aphthous ulcers   . History of Salmonella gastroenteritis   . Hx of adenomatous colonic polyps   . Mixed hyperlipidemia   . Myocardial infarction    IMI 10/04  . Osteoarthritis   . Osteoporosis   . Pancreatitis   . Rectal bleeding   . Regional enteritis of large intestine ([place])   . Sinus polyp   . TIA (transient ischemic attack)   . Tubular adenoma of colon 08/2012    Past Surgical History:  Procedure  Laterality Date  . CATARACT EXTRACTION Left   . EYE SURGERY    . Melanoma resection  1996  . TOTAL KNEE ARTHROPLASTY Right Feb 2012    Current Outpatient Prescriptions  Medication Sig Dispense Refill  . aspirin 81 MG tablet Take 81 mg by mouth daily.      . clopidogrel (PLAVIX) 75 MG tablet Take 1 tablet (75 mg total) by mouth daily. 30 tablet 6  . dicyclomine (BENTYL) 10 MG capsule Take 1 capsule (10 mg total) by mouth as needed for spasms. 30 capsule 3  . Fexofenadine HCl (ALLEGRA ALLERGY PO) Take 1 tablet by mouth as needed. Reported on 01/04/2016    . FLUoxetine (PROZAC) 40 MG capsule Take 40 mg by mouth daily.    . folic acid (FOLVITE) 1 MG tablet Take 1 tablet (1 mg total) by mouth daily. 100 tablet 11  . HYDROcodone-acetaminophen (LORCET) 10-650 MG per tablet Take 1 tablet by mouth every 12 (twelve) hours as needed.      . metoprolol succinate (TOPROL-XL) 25 MG 24 hr tablet Take 1 tablet (25 mg total) by mouth daily. 90 tablet 3  . nitroGLYCERIN (NITROSTAT) 0.4 MG SL tablet Place 1 tablet (0.4 mg total) under the tongue every 5 (five) minutes x 3 doses as needed. 25 tablet 3  . Omega-3 Fatty Acids (FISH OIL) 1200 MG CAPS Take 1 capsule by mouth  daily.      . sulfaSALAzine (AZULFIDINE) 500 MG tablet Take 2 tablets (1,000 mg total) by mouth 2 (two) times daily. 120 tablet 2   No current facility-administered medications for this visit.    Allergies:  Mercaptopurine; Sulfa antibiotics; and Penicillins   Social History: The patient  reports that she has been smoking Cigarettes.  She started smoking about 59 years ago. She has a 10.00 pack-year smoking history. She has never used smokeless tobacco. She reports that she does not drink alcohol or use drugs.   ROS:  Please see the history of present illness. Otherwise, complete review of systems is positive for chronic leg pain.  All other systems are reviewed and negative.   Physical Exam: VS:  BP 100/62   Pulse 64   Ht _0  (1.651 m)    Wt 212 lb 3.2 oz (96.3 kg)   SpO2 93%   BMI 35.31 kg/m , BMI Body mass index is 35.31 kg/m.  Wt Readings from Last 3 Encounters:  08/06/16 212 lb 3.2 oz (96.3 kg)  07/11/16 210 lb (95.3 kg)  01/10/16 214 lb (97.1 kg)    Comfortable in no acute distress. Using a walker. HEENT: Conjuctivae and lids normal, oropharynx clear.  Neck: Supple, no elevated JVP, soft right carotid bruit, no thyromegaly or tenderness.  Lungs: Somewhat prolonged expiratory phase with scattered rhonchi, no frank wheezing. Nonlabored at rest. Cor: RRR, no pathologic systolic murmurs. No S3 or rub.  Ext: Mild edema and venous stasis. Distal pulses 1-2+.  Skin: Warm and dry.  Musculoskeletal: No gross deformities.  Neuropsychiatric: Alert and oriented x3, affect appropriate.  ECG: I personally reviewed the tracing from 07/18/2015 which showed sinus rhythm with evidence of old inferior infarct, decreased R wave progression anteriorly.  Recent Labwork:  March 2017: Cholesterol 143, triglycerides 86, HDL 71, LDL 85, hemoglobin A1c 5.4, BUN 19, creatinine 0.6, ALT 7, AST 13, potassium 5.2, hemoglobin 12.7, platelets 253  Other Studies Reviewed Today:  Carotid Dopplers 07/11/2016: 60-79% RICA stenosis and 29-52% LICA stenosis.  Lexiscan Cardiolite 08/11/2015:  There was no ST segment deviation noted during stress.  Defect 1: There is a medium defect of moderate severity present in the basal inferoseptal, basal inferior, mid inferior and apical inferior location.  Findings consistent with prior myocardial infarction.  This is a low risk study.  Nuclear stress EF: 52%. Inferior wall hypokinesis noted.  Assessment and Plan:  1. CAD status post BMS to the RCA in 2004. She is stable on medical therapy with last Cardiolite in 2016 showing evidence of inferior scar but no significant ischemia. Continue medical therapy and observation.  2. Hyperlipidemia. She has stopped Crestor to see if it would help  with leg pain. Follow-up lipids pending with Dr. Scotty Court.  3. Essential hypertension, blood pressure is well controlled today.  4. Carotid artery disease, overall moderate as outlined above. She remains on antiplatelet therapy.  Current medicines were reviewed with the patient today.   Orders Placed This Encounter  Procedures  . EKG 12-Lead    Disposition: Follow-up in 6 months.  Signed, Satira Sark, MD, [facility] 08/06/2016 2:55 PM    [facility], [place], [place] [address] Phone: [phone]; Fax: [phone]

## 2016-08-06 ENCOUNTER — Ambulatory Visit (INDEPENDENT_AMBULATORY_CARE_PROVIDER_SITE_OTHER): Payer: PPO | Admitting: Cardiology

## 2016-08-06 ENCOUNTER — Encounter: Payer: Self-pay | Admitting: Cardiology

## 2016-08-06 VITALS — BP 100/62 | HR 64 | Ht 65.0 in | Wt 212.2 lb

## 2016-08-06 DIAGNOSIS — I6523 Occlusion and stenosis of bilateral carotid arteries: Secondary | ICD-10-CM | POA: Diagnosis not present

## 2016-08-06 DIAGNOSIS — I10 Essential (primary) hypertension: Secondary | ICD-10-CM | POA: Diagnosis not present

## 2016-08-06 DIAGNOSIS — E782 Mixed hyperlipidemia: Secondary | ICD-10-CM | POA: Diagnosis not present

## 2016-08-06 DIAGNOSIS — I2510 Atherosclerotic heart disease of native coronary artery without angina pectoris: Secondary | ICD-10-CM | POA: Diagnosis not present

## 2016-08-06 NOTE — Patient Instructions (Signed)

## 2016-08-12 DIAGNOSIS — E538 Deficiency of other specified B group vitamins: Secondary | ICD-10-CM | POA: Diagnosis not present

## 2016-08-12 DIAGNOSIS — I10 Essential (primary) hypertension: Secondary | ICD-10-CM | POA: Diagnosis not present

## 2016-08-12 DIAGNOSIS — R7301 Impaired fasting glucose: Secondary | ICD-10-CM | POA: Diagnosis not present

## 2016-08-12 DIAGNOSIS — E785 Hyperlipidemia, unspecified: Secondary | ICD-10-CM | POA: Diagnosis not present

## 2016-08-27 ENCOUNTER — Other Ambulatory Visit: Payer: Self-pay | Admitting: Cardiology

## 2016-08-27 MED ORDER — ROSUVASTATIN CALCIUM 10 MG PO TABS: 10.0000 mg | ORAL_TABLET | Freq: Every day | ORAL | 3 refills | Status: DC

## 2016-08-27 NOTE — Telephone Encounter (Signed)
Done.

## 2016-08-27 NOTE — Telephone Encounter (Signed)
Shelley [name] called stating that she is going back on   rosuvastatin (CRESTOR) 10 MG tablet   She needs a refill called to [facility]

## 2016-09-10 ENCOUNTER — Telehealth: Payer: Self-pay | Admitting: Gastroenterology

## 2016-09-10 NOTE — Telephone Encounter (Signed)
Dr Silverio Decamp please advise

## 2016-09-13 MED ORDER — FOLIC ACID 1 MG PO TABS: 1.0000 mg | ORAL_TABLET | Freq: Every day | ORAL | 4 refills | Status: DC

## 2016-09-13 NOTE — Telephone Encounter (Signed)
Spoke with patient and told her to continue the Folic Acid.Durene Cal in prescription to patients pharmacy as requested by patient , and to keep appt scheduled on the 29th

## 2016-09-13 NOTE — Telephone Encounter (Signed)
Ok to Continue folic acid, its OTC

## 2016-09-23 ENCOUNTER — Telehealth: Payer: Self-pay | Admitting: *Deleted

## 2016-09-23 ENCOUNTER — Ambulatory Visit (INDEPENDENT_AMBULATORY_CARE_PROVIDER_SITE_OTHER): Payer: PPO | Admitting: Gastroenterology

## 2016-09-23 ENCOUNTER — Encounter: Payer: Self-pay | Admitting: Gastroenterology

## 2016-09-23 VITALS — BP 128/72 | HR 74 | Ht 65.0 in | Wt 192.0 lb

## 2016-09-23 DIAGNOSIS — R2689 Other abnormalities of gait and mobility: Secondary | ICD-10-CM | POA: Diagnosis not present

## 2016-09-23 DIAGNOSIS — K5010 Crohn's disease of large intestine without complications: Secondary | ICD-10-CM

## 2016-09-23 DIAGNOSIS — R262 Difficulty in walking, not elsewhere classified: Secondary | ICD-10-CM

## 2016-09-23 MED ORDER — MESALAMINE 800 MG PO TBEC: 1600.0000 mg | DELAYED_RELEASE_TABLET | Freq: Two times a day (BID) | ORAL | 3 refills | Status: DC

## 2016-09-23 NOTE — Patient Instructions (Addendum)
If you are age 78 or older, your body mass index should be between 23-30. Your Body mass index is 31.95 kg/m. If this is out of the aforementioned range listed, please consider follow up with your Primary Care Provider.  If you are age 35 or younger, your body mass index should be between 19-25. Your Body mass index is 31.95 kg/m. If this is out of the aformentioned range listed, please consider follow up with your Primary Care Provider.    It has been recommended to you by your physician that you have a(n) Colonoscopy completed. Per your request, we did not schedule the procedure(s) today. Please contact our office at [phone] should you decide to have the procedure completed.  Stop Sulfasalazine  We have sent Asacol HD to your pharmacy

## 2016-09-23 NOTE — Telephone Encounter (Signed)
  09/23/2016   RE: NERIA PROCTER DOB: [birth year] MRN: [id number]   Dear Dr Domenic Polite,    We have scheduled the above patient for an endoscopic procedure. Our records show that she is on anticoagulation therapy.   Please advise as to how long the patient may come off her therapy of plavix prior to the procedure, Patient has not scheduled this procedure yet but will call back to schedule we need clearance for her to hold her plavix and how many days.  Please fax back/ or route the completed form to [place] at [phone].   Sincerely,    Genella Mech ,CMA AAMA

## 2016-09-23 NOTE — Progress Notes (Signed)
Shelley [name]    [id number]    12-12-1940  Primary Care Physician:TAPPER,DAVID B, MD  Referring Physician: Zella Richer. [address], [place], [place] [address]  Chief complaint:  Crohn's colitis  HPI: 76 year old female with history of Crohn's colitis diagnosed in 1998, has been maintained on sulfasalazine thousand milligrams twice daily here for follow-up visit. Patient has been having walking difficulty with imbalance. she thinks its related to sulfasalazine. She is on chronic narcotics, hydrocodone acetaminophen for arthritis. She has history of B12 deficiency but most recent levels in the past few years have been within normal range. Last B12 706. Hemoglobin A1c 5.6. Folate level within normal limits. Denies any increased bowel frequency or diarrhea. BM every day. Denies any nausea, vomiting, abdominal pain, melena or bright red blood per rectum Colonoscopy in 2014 with random colon biopsies negative for active inflammation or dysplasia, small tubular adenoma was removed from sigmoid colon.  Outpatient Encounter Prescriptions as of 09/23/2016  Medication Sig  . aspirin 81 MG tablet Take 81 mg by mouth daily.    . clopidogrel (PLAVIX) 75 MG tablet Take 1 tablet (75 mg total) by mouth daily.  Marland Kitchen dicyclomine (BENTYL) 10 MG capsule Take 1 capsule (10 mg total) by mouth as needed for spasms.  Marland Kitchen Fexofenadine HCl (ALLEGRA ALLERGY PO) Take 1 tablet by mouth as needed. Reported on 01/04/2016  . FLUoxetine (PROZAC) 40 MG capsule Take 40 mg by mouth daily.  . folic acid (FOLVITE) 1 MG tablet Take 1 tablet (1 mg total) by mouth daily.  Marland Kitchen HYDROcodone-acetaminophen (LORCET) 10-650 MG per tablet Take 1 tablet by mouth every 12 (twelve) hours as needed.    . metoprolol succinate (TOPROL-XL) 25 MG 24 hr tablet Take 1 tablet (25 mg total) by mouth daily.  . nitroGLYCERIN (NITROSTAT) 0.4 MG SL tablet Place 1 tablet (0.4 mg total) under the tongue every 5 (five) minutes x 3 doses as  needed.  . Omega-3 Fatty Acids (FISH OIL) 1200 MG CAPS Take 1 capsule by mouth daily.    . rosuvastatin (CRESTOR) 10 MG tablet Take 1 tablet (10 mg total) by mouth daily.  Marland Kitchen sulfaSALAzine (AZULFIDINE) 500 MG tablet Take 2 tablets (1,000 mg total) by mouth 2 (two) times daily.   No facility-administered encounter medications on file as of 09/23/2016.     Allergies as of 09/23/2016 - Review Complete 08/06/2016  Allergen Reaction Noted  . Mercaptopurine  01/27/2008  . Sulfa antibiotics  01/10/2011  . Penicillins Rash     Past Medical History:  Diagnosis Date  . Anxiety   . Carotid artery disease ([place])    33-83% RICA and LICA 2/91 - Dr. Oneida Alar  . Cataract   . COPD (chronic obstructive pulmonary disease) ([place])   . Coronary atherosclerosis of native coronary artery    BMS to RCA 2004  . Depression   . DVT (deep venous thrombosis) ([place])   . Essential hypertension, benign   . Fracture Left Great Toe  . Hepatic steatosis   . History of melanoma   . History of oral aphthous ulcers   . History of Salmonella gastroenteritis   . Hx of adenomatous colonic polyps   . Mixed hyperlipidemia   . Myocardial infarction    IMI 10/04  . Osteoarthritis   . Osteoporosis   . Pancreatitis   . Rectal bleeding   . Regional enteritis of large intestine ([place])   . Sinus polyp   .  TIA (transient ischemic attack)   . Tubular adenoma of colon 08/2012    Past Surgical History:  Procedure Laterality Date  . CATARACT EXTRACTION Left   . EYE SURGERY    . Melanoma resection  1996  . TOTAL KNEE ARTHROPLASTY Right Feb 2012    Family History  Problem Relation Age of Onset  . Diabetes type II Mother   . Heart attack Mother 75    questionable  . Uterine cancer Mother   . Diabetes Mother   . Heart disease Mother     before age 33  . Cancer Mother   . Varicose Veins Mother   . Other Brother     PVD and hx DVT  . Deep vein thrombosis Brother   . Breast cancer Sister   . Cancer Sister     Breast    . Hyperlipidemia Sister   . Hypertension Sister   . Colon cancer Neg Hx     Social History   Social History  . Marital status: Divorced    Spouse name: N/A  . Number of children: 2  . Years of education: N/A   Occupational History  . Retired   .  Retired   Social History Main Topics  . Smoking status: Light Tobacco Smoker    Packs/day: 0.25    Years: 40.00    Types: Cigarettes    Start date: 06/02/1957  . Smokeless tobacco: Never Used     Comment: trying to quit 1/2 pack day - so much stress in her lift  . Alcohol use No  . Drug use: No  . Sexual activity: Not on file   Other Topics Concern  . Not on file   Social History Narrative  . No narrative on file      Review of systems: Review of Systems  Constitutional: Negative for fever and chills.  HENT: Negative.   Eyes: Negative for blurred vision.  Respiratory: Negative for cough, shortness of breath and wheezing.   Cardiovascular: Negative for chest pain and palpitations.  Gastrointestinal: as per HPI Genitourinary: Negative for dysuria, urgency, frequency and hematuria.  Musculoskeletal: Positive  for myalgias, back pain and joint pain.  Skin: Negative for itching and rash.  Neurological: Negative for dizziness, tremors, focal weakness, seizures and loss of consciousness.  Endo/Heme/Allergies: Positive for seasonal allergies.  Psychiatric/Behavioral: Negative for depression, suicidal ideas and hallucinations.  All other systems reviewed and are negative.   Physical Exam: Vitals:   09/23/16 0952  BP: 128/72  Pulse: 74   Body mass index is 31.95 kg/m. Gen:      No acute distress HEENT:  EOMI, sclera anicteric Neck:     No masses; no thyromegaly Lungs:    Clear to auscultation bilaterally; normal respiratory effort CV:         Regular rate and rhythm; no murmurs Abd:      + bowel sounds; soft, non-tender; no palpable masses, no distension Ext:    No edema; adequate peripheral perfusion Skin:       Warm and dry; no rash Neuro: alert and oriented x 3 Psych: normal mood and affect  Data Reviewed:  Reviewed labs, radiology imaging, old records and pertinent past GI work up   Assessment and Plan/Recommendations:  76 year old female with long-standing history of Crohn's colitis diagnosed since 1998, has been maintained on sulfasalazine thousand milligrams twice daily She is here to discuss discontinuing sulfasalazine as she is worried that it could be causing her imbalance & walking difficulty  Last colonoscopy in 2014 with no evidence of active inflammation or dysplasia; she is past due for surveillance colonoscopy. She has history of colitis for about 20 years now and as per current guidelines recommendation is surveillance colonoscopy every one to 2 years for dysplasia Patient is reluctant to schedule colonoscopy Okay to discontinue sulfasalazine We'll switch to Asacol HD equivalent dose Advise patient to contact with any change of symptoms Return in 1 year or sooner if needed  25 minutes was spent face-to-face with the patient. Greater than 50% of the time used for counseling as well as treatment plan and follow-up. She had multiple questions which were answered to her satisfaction  K. Denzil Magnuson , MD [phone] Mon-Fri 8a-5p [phone] after 5p, weekends, holidays  CC: Deloria Lair., MD

## 2016-09-23 NOTE — Telephone Encounter (Signed)
Plavix can be held 7 days prior to the procedure.

## 2016-10-24 ENCOUNTER — Encounter: Payer: Self-pay | Admitting: Gastroenterology

## 2016-11-12 DIAGNOSIS — H353131 Nonexudative age-related macular degeneration, bilateral, early dry stage: Secondary | ICD-10-CM | POA: Diagnosis not present

## 2016-11-19 DIAGNOSIS — F17200 Nicotine dependence, unspecified, uncomplicated: Secondary | ICD-10-CM | POA: Diagnosis not present

## 2016-11-19 DIAGNOSIS — I10 Essential (primary) hypertension: Secondary | ICD-10-CM | POA: Diagnosis not present

## 2016-11-19 DIAGNOSIS — M5136 Other intervertebral disc degeneration, lumbar region: Secondary | ICD-10-CM | POA: Diagnosis not present

## 2016-11-19 DIAGNOSIS — R7301 Impaired fasting glucose: Secondary | ICD-10-CM | POA: Diagnosis not present

## 2016-11-20 ENCOUNTER — Telehealth: Payer: Self-pay | Admitting: Gastroenterology

## 2016-11-20 MED ORDER — SULFASALAZINE 500 MG PO TBEC
1000.0000 mg | DELAYED_RELEASE_TABLET | Freq: Two times a day (BID) | ORAL | 3 refills | Status: DC
Start: 2016-11-20 — End: 2017-03-13

## 2016-11-20 NOTE — Telephone Encounter (Signed)
Sulfasalazine 1 gm BID and Folic acid 1 gm daily. Thanks

## 2016-11-20 NOTE — Telephone Encounter (Signed)
Patient aware med sent in

## 2016-11-20 NOTE — Telephone Encounter (Signed)
Dr Silverio Decamp please advise if pt can go back on sulfasalazine and how you need her to take it

## 2016-11-27 ENCOUNTER — Telehealth: Payer: Self-pay | Admitting: Gastroenterology

## 2016-11-28 NOTE — Telephone Encounter (Signed)
Returned call back to [place]- answered all questions about medication

## 2017-01-06 ENCOUNTER — Encounter: Payer: Self-pay | Admitting: Family

## 2017-01-06 DIAGNOSIS — L821 Other seborrheic keratosis: Secondary | ICD-10-CM | POA: Diagnosis not present

## 2017-01-06 DIAGNOSIS — B372 Candidiasis of skin and nail: Secondary | ICD-10-CM | POA: Diagnosis not present

## 2017-01-06 DIAGNOSIS — L853 Xerosis cutis: Secondary | ICD-10-CM | POA: Diagnosis not present

## 2017-01-06 DIAGNOSIS — Z85820 Personal history of malignant melanoma of skin: Secondary | ICD-10-CM | POA: Diagnosis not present

## 2017-01-08 ENCOUNTER — Encounter (INDEPENDENT_AMBULATORY_CARE_PROVIDER_SITE_OTHER): Payer: Self-pay | Admitting: Orthopaedic Surgery

## 2017-01-08 ENCOUNTER — Ambulatory Visit (INDEPENDENT_AMBULATORY_CARE_PROVIDER_SITE_OTHER): Payer: PPO | Admitting: Orthopaedic Surgery

## 2017-01-08 VITALS — BP 129/60 | HR 57 | Resp 14 | Ht 64.0 in | Wt 175.0 lb

## 2017-01-08 DIAGNOSIS — G8929 Other chronic pain: Secondary | ICD-10-CM | POA: Diagnosis not present

## 2017-01-08 DIAGNOSIS — M25562 Pain in left knee: Secondary | ICD-10-CM

## 2017-01-08 MED ORDER — METHYLPREDNISOLONE ACETATE 40 MG/ML IJ SUSP
80.0000 mg | INTRAMUSCULAR | Status: AC | PRN
  Administered 2017-01-08: 80 mg

## 2017-01-08 MED ORDER — BUPIVACAINE HCL 0.5 % IJ SOLN
3.0000 mL | INTRAMUSCULAR | Status: AC | PRN
  Administered 2017-01-08: 3 mL via INTRA_ARTICULAR

## 2017-01-08 MED ORDER — LIDOCAINE HCL 1 % IJ SOLN
5.0000 mL | INTRAMUSCULAR | Status: AC | PRN
  Administered 2017-01-08: 5 mL

## 2017-01-08 NOTE — Progress Notes (Signed)
Office Visit Note   Patient: Shelley [name]           Date of Birth: 1941-02-20           MRN: [id number] Visit Date: 01/08/2017              Requested by: Deloria Lair., MD [place], [place] [address] PCP: Deloria Lair., MD   Assessment & Plan: Visit Diagnoses:  1. Chronic pain of left knee   Osteoarthritis left knee. Status post right total knee replacement 2012  Plan: Cortisone injection left knee. Shelley [name] is having some trouble with ambulation and plans to see Dr. Oneida Alar tomorrow for vascular evaluation. I also suggested that she check with Dr. Ellene Route regarding a chronic problem with her back. Right total knee replacement looks good without problem. I think the problem with her ambulation is most likely multifactorial. And she should check with the above.physician's. The happy to see her back at any time in the future  Follow-Up Instructions: Return if symptoms worsen or fail to improve.   Orders:  No orders of the defined types were placed in this encounter.  No orders of the defined types were placed in this encounter.     Procedures: Large Joint Inj Date/Time: 01/08/2017 2:54 PM Performed by: Garald Balding Authorized by: Garald Balding   Consent Given by:  Patient Timeout: prior to procedure the correct patient, procedure, and site was verified   Indications:  Pain and joint swelling Location:  Knee Site:  L knee Prep: patient was prepped and draped in usual sterile fashion   Needle Size:  25 G Needle Length:  1.5 inches Approach:  Anteromedial Ultrasound Guidance: No   Fluoroscopic Guidance: No   Arthrogram: No   Medications:  5 mL lidocaine 1 %; 80 mg methylPREDNISolone acetate 40 MG/ML; 3 mL bupivacaine 0.5 % Aspiration Attempted: No   Patient tolerance:  Patient tolerated the procedure well with no immediate complications     Clinical Data: No additional findings.   Subjective: Chief Complaint  Patient presents  with  . Left Knee - Pain    Shelley [name] is a 76 y o that presents with chronic left knee pain x years. Hx of R TKA that is sore today. She ambulates with a cane. She has redness in bilateral shins with an appt with Dr. Oneida Alar Thursday to check it out. No fevers, no injury  Shelley [name] is status post successful right total knee replacement in 2012. She's had some progressive problems with ambulation. She has an issue with chronic back problem having been evaluated by Dr. Ellene Route in the past. She's had prior injections and was told at one point that she may not be a candidate for surgery. As it's been several years I suggested she might want to check back with him. She also has had a prior cardiac history and is being followed every 6 months by her cardiologist. She is seeing Dr. Oneida Alar tomorrow for vascular evaluation. She's complaining of progressive pain in her left knee and wanted a cortisone injection today.  HPI  Review of Systems   Objective: Vital Signs: BP 129/60   Pulse (!) 57   Resp 14   Ht _0  (1.626 m)   Wt 175 lb (79.4 kg)   BMI 30.04 kg/m   Physical Exam  Ortho Exam left knee is large. I don't think she has an effusion. No increased heat. Increased varus with weightbearing  no. No instability. No calf pain. Some  venous stasis changes distally in the left leg. +1 pulses neurologically intact. Right leg raise negative bilaterally.  Specialty Comments:  No specialty comments available.  Imaging: No results found.   PMFS History: Patient Active Problem List   Diagnosis Date Noted  . Carotid stenosis 06/17/2014  . CORONARY ATHEROSCLEROSIS, NATIVE VESSEL 06/07/2009  . COPD 05/02/2009  . Mixed hyperlipidemia 10/31/2008  . TOBACCO ABUSE 10/31/2008  . Bilateral carotid artery occlusion 10/31/2008  . TIA 10/27/2007   Past Medical History:  Diagnosis Date  . Anxiety   . Carotid artery disease ([place])    14-44% RICA and LICA 5/84 - Dr. Oneida Alar  . Cataract   . COPD  (chronic obstructive pulmonary disease) ([place])   . Coronary atherosclerosis of native coronary artery    BMS to RCA 2004  . Depression   . DVT (deep venous thrombosis) ([place])   . Essential hypertension, benign   . Fracture Left Great Toe  . Hepatic steatosis   . History of melanoma   . History of oral aphthous ulcers   . History of Salmonella gastroenteritis   . Hx of adenomatous colonic polyps   . Mixed hyperlipidemia   . Myocardial infarction ([place])    IMI 10/04  . Osteoarthritis   . Osteoporosis   . Pancreatitis   . Rectal bleeding   . Regional enteritis of large intestine ([place])   . Sinus polyp   . TIA (transient ischemic attack)   . Tubular adenoma of colon 08/2012    Family History  Problem Relation Age of Onset  . Diabetes type II Mother   . Heart attack Mother 1       questionable  . Uterine cancer Mother   . Diabetes Mother   . Heart disease Mother        before age 6  . Cancer Mother   . Varicose Veins Mother   . Other Brother        PVD and hx DVT  . Deep vein thrombosis Brother   . Breast cancer Sister   . Cancer Sister        Breast  . Hyperlipidemia Sister   . Hypertension Sister   . Colon cancer Neg Hx     Past Surgical History:  Procedure Laterality Date  . CATARACT EXTRACTION Left   . EYE SURGERY    . Melanoma resection  1996  . TOTAL KNEE ARTHROPLASTY Right Feb 2012   Social History   Occupational History  . Retired   .  Retired   Social History Main Topics  . Smoking status: Light Tobacco Smoker    Packs/day: 0.25    Years: 40.00    Types: Cigarettes    Start date: 06/02/1957  . Smokeless tobacco: Never Used     Comment: trying to quit 1/2 pack day - so much stress in her lift  . Alcohol use No  . Drug use: No  . Sexual activity: Not on file     Garald Balding, MD   Note - This record has been created using [employer].  Chart creation errors have been sought, but may not always  have been located. Such creation errors  do not reflect on  the standard of medical care.

## 2017-01-09 ENCOUNTER — Ambulatory Visit (INDEPENDENT_AMBULATORY_CARE_PROVIDER_SITE_OTHER): Payer: PPO | Admitting: Family

## 2017-01-09 ENCOUNTER — Encounter: Payer: Self-pay | Admitting: Family

## 2017-01-09 ENCOUNTER — Encounter (HOSPITAL_COMMUNITY): Payer: PPO

## 2017-01-09 ENCOUNTER — Ambulatory Visit (HOSPITAL_COMMUNITY)
Admission: RE | Admit: 2017-01-09 | Discharge: 2017-01-09 | Disposition: A | Discharge status: Home / Self Care | Payer: PPO | Source: Ambulatory Visit | Attending: Family | Admitting: Family

## 2017-01-09 ENCOUNTER — Ambulatory Visit: Payer: PPO | Admitting: Family

## 2017-01-09 VITALS — BP 156/75 | HR 67 | Resp 16 | Ht 64.0 in | Wt 216.0 lb

## 2017-01-09 DIAGNOSIS — I6523 Occlusion and stenosis of bilateral carotid arteries: Secondary | ICD-10-CM | POA: Diagnosis not present

## 2017-01-09 DIAGNOSIS — R609 Edema, unspecified: Secondary | ICD-10-CM

## 2017-01-09 DIAGNOSIS — Z8673 Personal history of transient ischemic attack (TIA), and cerebral infarction without residual deficits: Secondary | ICD-10-CM

## 2017-01-09 DIAGNOSIS — F17200 Nicotine dependence, unspecified, uncomplicated: Secondary | ICD-10-CM | POA: Insufficient documentation

## 2017-01-09 LAB — VAS US CAROTID
LCCADDIAS: 19 cm/s
LCCADSYS: 112 cm/s
LEFT ECA DIAS: -9 cm/s
LICADDIAS: -17 cm/s
LICADSYS: -94 cm/s
LICAPDIAS: -48 cm/s
LICAPSYS: -254 cm/s
Left CCA prox dias: 15 cm/s
Left CCA prox sys: 76 cm/s
RIGHT CCA MID DIAS: 29 cm/s
RIGHT ECA DIAS: 0 cm/s
Right CCA prox dias: 15 cm/s
Right CCA prox sys: 90 cm/s
Right cca dist sys: -65 cm/s

## 2017-01-09 NOTE — Progress Notes (Signed)
Chief Complaint: Follow up Extracranial Carotid Artery Stenosis   History of Present Illness  Shelley [name] is a 76 y.o. female patient of Dr. Oneida Alar with a history of carotid artery stenosis and no history of carotid intervention.  She returns for surveillance.  The patient does have a history of MI and 2 cardiac stents placed in 2004 and TIA symptoms many years ago manifested as transient right arm weakness, but denies history of stroke.  She has had no further stroke or TIA symptoms.  The patient denies a history of amaurosis fugax or monocular blindness, unilateral facial drooping, or receptive or expressive aphasia. ..  She denies current hemiplegia.  Patient denies non-healing wounds.   She has fallen a few times as her legs seem to give way, states this is due to her knee and back issues. Wearing a left knee brace as measure to help severe left knee OA, does not want knee replacement.  She lives in a 3 story house and climbs stairs several times/day, but her ambulation is otherwise limited by arthritis and pain from lumbar spine problems: DDD, HNP, arthritis, left leg sciatica.   She was receiving physical therapy due to frequent falls; states her last fall was June of 2017.   Pt denies any more dyspnea than usual, denies chest pain, states she has a smoker's cough, but has been bothered by more cough this Spring with allergies, Claritin helps. Her grand daughter died recently of cystic fibrosis and pt is grieving.   Pt Diabetic: No, last A1C was 5.6 on 08-12-16(review of records) Pt smoker: smoker (decreased to 1-3 cigarette/day, started in high school, quit several times in these yrs)  Pt meds include:  Statin : yes Betablocker: Yes  ASA: Yes  Other anticoagulants/antiplatelets: Plavix    Past Medical History:  Diagnosis Date  . Anxiety   . Carotid artery disease ([place])    76-16% RICA and LICA 0/73 - Dr. Oneida Alar  . Cataract   . COPD (chronic  obstructive pulmonary disease) ([place])   . Coronary atherosclerosis of native coronary artery    BMS to RCA 2004  . Depression   . DVT (deep venous thrombosis) ([place])   . Essential hypertension, benign   . Fracture Left Great Toe  . Hepatic steatosis   . History of melanoma   . History of oral aphthous ulcers   . History of Salmonella gastroenteritis   . Hx of adenomatous colonic polyps   . Mixed hyperlipidemia   . Myocardial infarction ([place])    IMI 10/04  . Osteoarthritis   . Osteoporosis   . Pancreatitis   . Rectal bleeding   . Regional enteritis of large intestine ([place])   . Sinus polyp   . TIA (transient ischemic attack)   . Tubular adenoma of colon 08/2012    Social History Social History  Substance Use Topics  . Smoking status: Light Tobacco Smoker    Packs/day: 0.25    Years: 40.00    Types: Cigarettes    Start date: 06/02/1957  . Smokeless tobacco: Never Used     Comment: trying to quit 1/2 pack day - so much stress in her lift  . Alcohol use No    Family History Family History  Problem Relation Age of Onset  . Diabetes type II Mother   . Heart attack Mother 46       questionable  . Uterine cancer Mother   . Diabetes Mother   . Heart disease Mother  before age 9  . Cancer Mother   . Varicose Veins Mother   . Other Brother        PVD and hx DVT  . Deep vein thrombosis Brother   . Breast cancer Sister   . Cancer Sister        Breast  . Hyperlipidemia Sister   . Hypertension Sister   . Colon cancer Neg Hx     Surgical History Past Surgical History:  Procedure Laterality Date  . CATARACT EXTRACTION Left   . EYE SURGERY    . Melanoma resection  1996  . TOTAL KNEE ARTHROPLASTY Right Feb 2012    Allergies  Allergen Reactions  . Mercaptopurine     REACTION: pancreatitis  . Sulfa Antibiotics   . Penicillins Rash    REACTION: rash    Current Outpatient Prescriptions  Medication Sig Dispense Refill  . aspirin 81 MG tablet Take 81 mg by  mouth daily.      . clopidogrel (PLAVIX) 75 MG tablet Take 1 tablet (75 mg total) by mouth daily. 30 tablet 6  . dicyclomine (BENTYL) 10 MG capsule Take 1 capsule (10 mg total) by mouth as needed for spasms. 30 capsule 3  . Fexofenadine HCl (ALLEGRA ALLERGY PO) Take 1 tablet by mouth as needed. Reported on 01/04/2016    . FLUoxetine (PROZAC) 40 MG capsule Take 40 mg by mouth daily.    . folic acid (FOLVITE) 1 MG tablet Take 1 tablet (1 mg total) by mouth daily. 90 tablet 4  . HYDROcodone-acetaminophen (LORCET) 10-650 MG per tablet Take 1 tablet by mouth every 12 (twelve) hours as needed.      . metoprolol succinate (TOPROL-XL) 25 MG 24 hr tablet Take 1 tablet (25 mg total) by mouth daily. 90 tablet 3  . nitroGLYCERIN (NITROSTAT) 0.4 MG SL tablet Place 1 tablet (0.4 mg total) under the tongue every 5 (five) minutes x 3 doses as needed. 25 tablet 3  . Omega-3 Fatty Acids (FISH OIL) 1200 MG CAPS Take 1 capsule by mouth daily.      . rosuvastatin (CRESTOR) 10 MG tablet Take 10 mg by mouth daily.    Marland Kitchen sulfaSALAzine (AZULFIDINE) 500 MG EC tablet Take 2 tablets (1,000 mg total) by mouth 2 (two) times daily. 120 tablet 3   No current facility-administered medications for this visit.     Review of Systems : See HPI for pertinent positives and negatives.  Physical Examination  Vitals:   01/09/17 1031 01/09/17 1034 01/09/17 1036  BP: (!) 142/68 (!) 149/74 (!) 156/75  Pulse: 66 69 67  Resp: 16    SpO2: 92% 93% 93%  Weight: 216 lb (98 kg) 216 lb (98 kg)   Height: _0  (1.626 m) _1  (1.626 m)    Body mass index is 37.08 kg/m.  General: WDWN obese female in NAD. GAIT:uses a walker, antalgic.  Eyes: PERRLA  Pulmonary: Respirations are non labored,limited air movement in all fields, no rales, rhonchi, or wheezes.  Cardiac: regular rhythm, no detected murmur.  VASCULAR EXAM Carotid Bruits Left Right   Negative  positive  Aorta is not palpable.  Radial pulses  are 2+ palpable and equal.   LE Pulses  LEFT  RIGHT   POPLITEAL  not palpable  not palpable  POSTERIOR TIBIAL  not palpable  not palpable   DORSALIS PEDIS ANTERIOR TIBIAL  palpable  palpable    Gastrointestinal: soft, nontender, BS WNL, no r/g, no palpated masses.  Musculoskeletal: No muscle  atrophy/wasting. M/S 5/5 throughout except left lower extremity is 4/5, Extremities without ischemic changes. Chronic venous stasis changes in lower legs with 1-2+bilateral non pitting edema, no ulcerations, + mild erythema at anterior aspects both lower legs, normal temperature to touch.  Neurologic: A&O X 3; Appropriate Affect, Speech is normal  CN 2-12 intact, Pain and light touch intact in extremities, Motor exam as listed above.    Assessment: KINDAL PONTI is a 76 y.o. female who has a history of a remote TIA, none subsequently.   Her atherosclerotic risk factors include current smoking (since in her teens), CAD, COPD, and obesity.   She keeps her lower legs dependent during the day and subsequently is developing venous stasis changes in her lower legs with non pitting edema and mild erythema, see Plan.   DATA Today's carotid duplex suggests 60-79% right ICA stenosis and 40-59% left ICA stenosis. Bilateral vertebral artery flow is antegrade. Bilateral subclavian artery waveforms are normal. No significant change compared to the exams on 01-04-16 and 07-11-16.   Plan: Follow-up in 6 months with Carotid Duplex scan.  Dependant edema: elevate feet above slightly flexed knees, feet above heart, overnight and 4x/day for 20 minutes.  Daily seated leg exercises as discussed and demonstrated.  The patient was counseled re smoking cessation and given several free resources re smoking cessation.   I discussed in depth with the patient the nature of atherosclerosis, and emphasized the importance of maximal medical management including strict  control of blood pressure, blood glucose, and lipid levels, obtaining regular exercise, and cessation of smoking.  The patient is aware that without maximal medical management the underlying atherosclerotic disease process will progress, limiting the benefit of any interventions. The patient was given information about stroke prevention and what symptoms should prompt the patient to seek immediate medical care. Thank you for allowing [place] to participate in this patient's care.  Clemon Chambers, RN, MSN, FNP-C [facility] Office: [facility] Physician: Oneida Alar  01/09/17 10:45 AM

## 2017-01-09 NOTE — Patient Instructions (Signed)
Stroke Prevention Some medical conditions and behaviors are associated with an increased chance of having a stroke. You may prevent a stroke by making healthy choices and managing medical conditions. How can I reduce my risk of having a stroke?  Stay physically active. Get at least 30 minutes of activity on most or all days.  Do not smoke. It may also be helpful to avoid exposure to secondhand smoke.  Limit alcohol use. Moderate alcohol use is considered to be:  No more than 2 drinks per day for men.  No more than 1 drink per day for nonpregnant women.  Eat healthy foods. This involves:  Eating 5 or more servings of fruits and vegetables a day.  Making dietary changes that address high blood pressure (hypertension), high cholesterol, diabetes, or obesity.  Manage your cholesterol levels.  Making food choices that are high in fiber and low in saturated fat, trans fat, and cholesterol may control cholesterol levels.  Take any prescribed medicines to control cholesterol as directed by your health care provider.  Manage your diabetes.  Controlling your carbohydrate and sugar intake is recommended to manage diabetes.  Take any prescribed medicines to control diabetes as directed by your health care provider.  Control your hypertension.  Making food choices that are low in salt (sodium), saturated fat, trans fat, and cholesterol is recommended to manage hypertension.  Ask your health care provider if you need treatment to lower your blood pressure. Take any prescribed medicines to control hypertension as directed by your health care provider.  If you are 18-39 years of age, have your blood pressure checked every 3-5 years. If you are 40 years of age or older, have your blood pressure checked every year.  Maintain a healthy weight.  Reducing calorie intake and making food choices that are low in sodium, saturated fat, trans fat, and cholesterol are recommended to manage  weight.  Stop drug abuse.  Avoid taking birth control pills.  Talk to your health care provider about the risks of taking birth control pills if you are over 35 years old, smoke, get migraines, or have ever had a blood clot.  Get evaluated for sleep disorders (sleep apnea).  Talk to your health care provider about getting a sleep evaluation if you snore a lot or have excessive sleepiness.  Take medicines only as directed by your health care provider.  For some people, aspirin or blood thinners (anticoagulants) are helpful in reducing the risk of forming abnormal blood clots that can lead to stroke. If you have the irregular heart rhythm of atrial fibrillation, you should be on a blood thinner unless there is a good reason you cannot take them.  Understand all your medicine instructions.  Make sure that other conditions (such as anemia or atherosclerosis) are addressed. Get help right away if:  You have sudden weakness or numbness of the face, arm, or leg, especially on one side of the body.  Your face or eyelid droops to one side.  You have sudden confusion.  You have trouble speaking (aphasia) or understanding.  You have sudden trouble seeing in one or both eyes.  You have sudden trouble walking.  You have dizziness.  You have a loss of balance or coordination.  You have a sudden, severe headache with no known cause.  You have new chest pain or an irregular heartbeat. Any of these symptoms may represent a serious problem that is an emergency. Do not wait to see if the symptoms will go away.   Get medical help at once. Call your local emergency services (911 in [place] Do not drive yourself to the hospital. This information is not intended to replace advice given to you by your health care provider. Make sure you discuss any questions you have with your health care provider. Document Released: 09/19/2004 Document Revised: 01/18/2016 Document Reviewed: 02/12/2013 Elsevier  Interactive Patient Education  2017 [employer].     Steps to Quit Smoking Smoking tobacco can be bad for your health. It can also affect almost every organ in your body. Smoking puts you and people around you at risk for many serious long-lasting (chronic) diseases. Quitting smoking is hard, but it is one of the best things that you can do for your health. It is never too late to quit. What are the benefits of quitting smoking? When you quit smoking, you lower your risk for getting serious diseases and conditions. They can include:  Lung cancer or lung disease.  Heart disease.  Stroke.  Heart attack.  Not being able to have children (infertility).  Weak bones (osteoporosis) and broken bones (fractures). If you have coughing, wheezing, and shortness of breath, those symptoms may get better when you quit. You may also get sick less often. If you are pregnant, quitting smoking can help to lower your chances of having a baby of low birth weight. What can I do to help me quit smoking? Talk with your doctor about what can help you quit smoking. Some things you can do (strategies) include:  Quitting smoking totally, instead of slowly cutting back how much you smoke over a period of time.  Going to in-person counseling. You are more likely to quit if you go to many counseling sessions.  Using resources and support systems, such as:  Online chats with a Social worker.  Phone quitlines.  Printed Furniture conservator/restorer.  Support groups or group counseling.  Text messaging programs.  Mobile phone apps or applications.  Taking medicines. Some of these medicines may have nicotine in them. If you are pregnant or breastfeeding, do not take any medicines to quit smoking unless your doctor says it is okay. Talk with your doctor about counseling or other things that can help you. Talk with your doctor about using more than one strategy at the same time, such as taking medicines while you are  also going to in-person counseling. This can help make quitting easier. What things can I do to make it easier to quit? Quitting smoking might feel very hard at first, but there is a lot that you can do to make it easier. Take these steps:  Talk to your family and friends. Ask them to support and encourage you.  Call phone quitlines, reach out to support groups, or work with a Social worker.  Ask people who smoke to not smoke around you.  Avoid places that make you want (trigger) to smoke, such as:  Bars.  Parties.  Smoke-break areas at work.  Spend time with people who do not smoke.  Lower the stress in your life. Stress can make you want to smoke. Try these things to help your stress:  Getting regular exercise.  Deep-breathing exercises.  Yoga.  Meditating.  Doing a body scan. To do this, close your eyes, focus on one area of your body at a time from head to toe, and notice which parts of your body are tense. Try to relax the muscles in those areas.  Download or buy apps on your mobile phone or tablet that  can help you stick to your quit plan. There are many free apps, such as QuitGuide from the [employer] Office manager for Disease Control and Prevention). You can find more support from smokefree.gov and other websites. This information is not intended to replace advice given to you by your health care provider. Make sure you discuss any questions you have with your health care provider. Document Released: 06/08/2009 Document Revised: 04/09/2016 Document Reviewed: 12/27/2014 Elsevier Interactive Patient Education  2017 [employer].

## 2017-01-15 NOTE — Addendum Note (Signed)
Addended by: Lianne Cure A on: 01/15/2017 10:40 AM   Modules accepted: Orders

## 2017-02-19 NOTE — Progress Notes (Signed)
Cardiology Office Note  Date: 02/20/2017   ID: YENTL VERGE, DOB 07/29/1941, MRN [id number]  PCP: Deloria Lair., MD  Primary Cardiologist: Rozann Lesches, MD   Chief Complaint  Patient presents with  . Coronary Artery Disease    History of Present Illness: Shelley [name] is a 76 y.o. female last seen in December 2017. She presents for a routine follow-up visit. From a cardiac perspective, she does not report any angina symptoms or nitroglycerin use. She has been very limited physically in terms of arthritis pain, uses a cane, and is not exercising at this time on a regular basis.  Recent visit with [facility] noted in May. Carotid Dopplers showed 60-79% RICA stenosis and 93-81% LICA stenosis. She remains on antiplatelet regimen and statin therapy.  We discussed symptoms off Crestor. She states that her legs did not feel any better and she has been back on Crestor with plan to follow-up lab work with Dr. Scotty Court soon.  Past Medical History:  Diagnosis Date  . Anxiety   . Carotid artery disease ([place])    82-99% RICA and LICA 3/71 - Dr. Oneida Alar  . Cataract   . COPD (chronic obstructive pulmonary disease) ([place])   . Coronary atherosclerosis of native coronary artery    BMS to RCA 2004  . Depression   . DVT (deep venous thrombosis) ([place])   . Essential hypertension, benign   . Fracture Left Great Toe  . Hepatic steatosis   . History of melanoma   . History of oral aphthous ulcers   . History of Salmonella gastroenteritis   . Hx of adenomatous colonic polyps   . Mixed hyperlipidemia   . Myocardial infarction ([place])    IMI 10/04  . Osteoarthritis   . Osteoporosis   . Pancreatitis   . Rectal bleeding   . Regional enteritis of large intestine ([place])   . Sinus polyp   . TIA (transient ischemic attack)   . Tubular adenoma of colon 08/2012    Past Surgical History:  Procedure Laterality Date  . CATARACT EXTRACTION Left   . EYE SURGERY    . Melanoma resection  1996  .  TOTAL KNEE ARTHROPLASTY Right Feb 2012    Current Outpatient Prescriptions  Medication Sig Dispense Refill  . aspirin 81 MG tablet Take 81 mg by mouth daily.      . clopidogrel (PLAVIX) 75 MG tablet Take 1 tablet (75 mg total) by mouth daily. 30 tablet 6  . dicyclomine (BENTYL) 10 MG capsule Take 1 capsule (10 mg total) by mouth as needed for spasms. 30 capsule 3  . Fexofenadine HCl (ALLEGRA ALLERGY PO) Take 1 tablet by mouth as needed. Reported on 01/04/2016    . FLUoxetine (PROZAC) 40 MG capsule Take 40 mg by mouth daily.    . folic acid (FOLVITE) 1 MG tablet Take 1 tablet (1 mg total) by mouth daily. 90 tablet 4  . HYDROcodone-acetaminophen (NORCO) 10-325 MG tablet Take 1 tablet by mouth every 6 (six) hours as needed.    . metoprolol succinate (TOPROL-XL) 25 MG 24 hr tablet Take 1 tablet (25 mg total) by mouth daily. 90 tablet 3  . nitroGLYCERIN (NITROSTAT) 0.4 MG SL tablet Place 1 tablet (0.4 mg total) under the tongue every 5 (five) minutes x 3 doses as needed. 25 tablet 3  . Omega-3 Fatty Acids (FISH OIL) 1200 MG CAPS Take 1 capsule by mouth daily.      . rosuvastatin (CRESTOR) 10 MG tablet Take  10 mg by mouth daily.    Marland Kitchen sulfaSALAzine (AZULFIDINE) 500 MG EC tablet Take 2 tablets (1,000 mg total) by mouth 2 (two) times daily. 120 tablet 3   No current facility-administered medications for this visit.    Allergies:  Mercaptopurine; Sulfa antibiotics; and Penicillins   Social History: The patient  reports that she has been smoking Cigarettes.  She started smoking about 59 years ago. She has a 10.00 pack-year smoking history. She has never used smokeless tobacco. She reports that she does not drink alcohol or use drugs.   ROS:  Please see the history of present illness. Otherwise, complete review of systems is positive for arthritic pain and stiffness.  All other systems are reviewed and negative.   Physical Exam: VS:  BP 120/68   Pulse 68   Ht _0  (1.651 m)   Wt 206 lb (93.4 kg)    SpO2 93%   BMI 34.28 kg/m , BMI Body mass index is 34.28 kg/m.  Wt Readings from Last 3 Encounters:  02/20/17 206 lb (93.4 kg)  01/09/17 216 lb (98 kg)  01/08/17 175 lb (79.4 kg)    Appears comfortable. Using a cane. HEENT: Conjuctivae and lids normal, oropharynx clear.  Neck: Supple, no elevated JVP, soft right carotid bruit, no thyromegaly.  Lungs: No wheezing. Nonlabored at rest. Cor: RRR, no pathologic systolic murmurs. No S3 or rub.  Ext: Mild edema and venous stasis. Distal pulses 1-2+.  Skin: Warm and dry.  Musculoskeletal: No gross deformities.  Neuropsychiatric: Alert and oriented x3, affect appropriate.  ECG: I personally reviewed the tracing from 08/06/2016 which showed sinus rhythm with LBBB.  Recent Labwork:  December 2017: cholesterol 188, triglycerides 76, HDL 65, LDL 105, Hgb A1c 5.6   Other Studies Reviewed Today:  Lexiscan Cardiolite 08/11/2015:  There was no ST segment deviation noted during stress.  Defect 1: There is a medium defect of moderate severity present in the basal inferoseptal, basal inferior, mid inferior and apical inferior location.  Findings consistent with prior myocardial infarction.  This is a low risk study.  Nuclear stress EF: 52%. Inferior wall hypokinesis noted.  Assessment and Plan:  1. Symptomatically stable CAD status post BMS to RCA in 2004. She underwent ischemic testing within the last 2 years as noted above. Plan to continue medical therapy and observation.  2. Bilateral carotid artery disease, continues to follow with [facility] and remains on antiplatelet regimen as well as statin therapy.  3. Essential hypertension, blood pressure well controlled today. No changes made to current regimen. She remains on Toprol-XL.  4. Hyperlipidemia, on Crestor. Plans to follow-up lab work with Dr. Scotty Court.  Current medicines were reviewed with the patient today.  Disposition: Follow-up in 6 months.  Signed, Satira Sark,  MD, [facility] 02/20/2017 9:26 AM    [facility] [place], [place],  [id number] Phone: [phone]; Fax: [phone]

## 2017-02-20 ENCOUNTER — Encounter: Payer: Self-pay | Admitting: Cardiology

## 2017-02-20 ENCOUNTER — Ambulatory Visit (INDEPENDENT_AMBULATORY_CARE_PROVIDER_SITE_OTHER): Payer: PPO | Admitting: Cardiology

## 2017-02-20 VITALS — BP 120/68 | HR 68 | Ht 65.0 in | Wt 206.0 lb

## 2017-02-20 DIAGNOSIS — I10 Essential (primary) hypertension: Secondary | ICD-10-CM

## 2017-02-20 DIAGNOSIS — E782 Mixed hyperlipidemia: Secondary | ICD-10-CM | POA: Diagnosis not present

## 2017-02-20 DIAGNOSIS — I739 Peripheral vascular disease, unspecified: Secondary | ICD-10-CM

## 2017-02-20 DIAGNOSIS — I779 Disorder of arteries and arterioles, unspecified: Secondary | ICD-10-CM | POA: Diagnosis not present

## 2017-02-20 DIAGNOSIS — I2510 Atherosclerotic heart disease of native coronary artery without angina pectoris: Secondary | ICD-10-CM | POA: Diagnosis not present

## 2017-02-20 NOTE — Patient Instructions (Signed)

## 2017-03-13 ENCOUNTER — Ambulatory Visit (INDEPENDENT_AMBULATORY_CARE_PROVIDER_SITE_OTHER): Payer: PPO | Admitting: Gastroenterology

## 2017-03-13 ENCOUNTER — Encounter: Payer: Self-pay | Admitting: Gastroenterology

## 2017-03-13 VITALS — BP 100/60 | HR 60 | Ht 65.0 in | Wt 206.6 lb

## 2017-03-13 DIAGNOSIS — K5010 Crohn's disease of large intestine without complications: Secondary | ICD-10-CM | POA: Diagnosis not present

## 2017-03-13 DIAGNOSIS — K5900 Constipation, unspecified: Secondary | ICD-10-CM | POA: Diagnosis not present

## 2017-03-13 MED ORDER — SULFASALAZINE 500 MG PO TABS: 500.0000 mg | ORAL_TABLET | Freq: Two times a day (BID) | ORAL | 6 refills | Status: AC

## 2017-03-13 NOTE — Progress Notes (Signed)
Shelley [name]    [id number]    13-Aug-1941  Primary Care Physician:Tapper, Zella Richer., MD  Referring Physician: Deloria Lair., MD [place], [place] [address]  Chief complaint:  Crohn's disease  HPI: 76 year old female with history of Crohn's colitis diagnosed in 1998 currently in remission on sulfasalazine here for follow-up visit. Patient is doing well overall and denies any diarrhea, blood per rectum, abdominal pain, nausea or vomiting. She is taking B12 capsule. She is on aspirin and Plavix for CAD. On review of system patient complained that she is passing hard stool/pellets intermittently. Last Colonoscopy 2014: Small tubular adenoma was removed from sigmoid colon otherwise random biopsies negative for active inflammation or dysplasia.   Outpatient Encounter Prescriptions as of 03/13/2017  Medication Sig  . aspirin 81 MG tablet Take 81 mg by mouth daily.    . clopidogrel (PLAVIX) 75 MG tablet Take 1 tablet (75 mg total) by mouth daily.  . Cyanocobalamin (B-12 PO) Take 1 capsule every day x 7 days every other week  . ferrous sulfate 325 (65 FE) MG tablet Take 325 mg by mouth daily.  Marland Kitchen Fexofenadine HCl (ALLEGRA ALLERGY PO) Take 1 tablet by mouth daily as needed. Reported on 01/04/2016  . FLUoxetine (PROZAC) 40 MG capsule Take 40 mg by mouth daily.  . folic acid (FOLVITE) 1 MG tablet Take 1 tablet (1 mg total) by mouth daily.  Marland Kitchen HYDROcodone-acetaminophen (NORCO) 10-325 MG tablet Take 1 tablet by mouth every 6 (six) hours as needed.  . metoprolol succinate (TOPROL-XL) 25 MG 24 hr tablet Take 1 tablet (25 mg total) by mouth daily.  . Misc Natural Products (OSTEO BI-FLEX JOINT SHIELD PO) Take 1 capsule by mouth daily.  . nitroGLYCERIN (NITROSTAT) 0.4 MG SL tablet Place 1 tablet (0.4 mg total) under the tongue every 5 (five) minutes x 3 doses as needed.  . Omega-3 Fatty Acids (FISH OIL) 1200 MG CAPS Take 1 capsule by mouth daily.    . rosuvastatin (CRESTOR)  10 MG tablet Take 10 mg by mouth daily.  Marland Kitchen sulfaSALAzine (AZULFIDINE) 500 MG EC tablet Take 2 tablets (1,000 mg total) by mouth 2 (two) times daily.  Marland Kitchen dicyclomine (BENTYL) 10 MG capsule Take 1 capsule (10 mg total) by mouth as needed for spasms. (Patient not taking: Reported on 03/13/2017)   No facility-administered encounter medications on file as of 03/13/2017.     Allergies as of 03/13/2017 - Review Complete 03/13/2017  Allergen Reaction Noted  . Mercaptopurine  01/27/2008  . Sulfa antibiotics  01/10/2011  . Penicillins Rash     Past Medical History:  Diagnosis Date  . Anxiety   . Carotid artery disease ([place])    43-83% RICA and LICA 7/79 - Dr. Oneida Alar  . Cataract   . COPD (chronic obstructive pulmonary disease) ([place])   . Coronary atherosclerosis of native coronary artery    BMS to RCA 2004  . Depression   . DVT (deep venous thrombosis) ([place])   . Essential hypertension, benign   . Fracture Left Great Toe  . Hepatic steatosis   . History of melanoma   . History of oral aphthous ulcers   . History of Salmonella gastroenteritis   . Hx of adenomatous colonic polyps   . Mixed hyperlipidemia   . Myocardial infarction ([place])    IMI 10/04  . Osteoarthritis   . Osteoporosis   . Pancreatitis   . Rectal bleeding   .  Regional enteritis of large intestine ([place])   . Sinus polyp   . TIA (transient ischemic attack)   . Tubular adenoma of colon 08/2012    Past Surgical History:  Procedure Laterality Date  . CATARACT EXTRACTION Bilateral   . Melanoma resection  1996  . TOTAL KNEE ARTHROPLASTY Right Feb 2012    Family History  Problem Relation Age of Onset  . Diabetes type II Mother   . Heart attack Mother 15       questionable  . Uterine cancer Mother   . Heart disease Mother        before age 90  . Varicose Veins Mother   . Other Brother        PVD and hx DVT  . Deep vein thrombosis Brother   . Breast cancer Sister   . Hyperlipidemia Sister   . Hypertension Sister   .  Colon cancer Neg Hx     Social History   Social History  . Marital status: Divorced    Spouse name: N/A  . Number of children: 2  . Years of education: N/A   Occupational History  . Retired   .  Retired   Social History Main Topics  . Smoking status: Light Tobacco Smoker    Packs/day: 0.25    Years: 40.00    Types: Cigarettes    Start date: 06/02/1957  . Smokeless tobacco: Never Used     Comment: trying to quit 1/2 pack day - so much stress in her lift  . Alcohol use No  . Drug use: No  . Sexual activity: Not on file   Other Topics Concern  . Not on file   Social History Narrative  . No narrative on file      Review of systems: Review of Systems  Constitutional: Negative for fever and chills.  HENT: Negative.   Eyes: Negative for blurred vision.  Respiratory: Negative for cough, shortness of breath and wheezing.   Cardiovascular: Negative for chest pain and palpitations.  Gastrointestinal: as per HPI Genitourinary: Negative for dysuria, urgency, frequency and hematuria.  Musculoskeletal: Negative for myalgias, back pain and joint pain.  Skin: Negative for itching and rash.  Neurological: Negative for dizziness, tremors, focal weakness, seizures and loss of consciousness.  Endo/Heme/Allergies: Positive for seasonal allergies.  Psychiatric/Behavioral: Negative for depression, suicidal ideas and hallucinations.  All other systems reviewed and are negative.   Physical Exam: Vitals:   03/13/17 0941  BP: 100/60  Pulse: 60   Body mass index is 34.38 kg/m. Gen:      No acute distress HEENT:  EOMI, sclera anicteric Neck:     No masses; no thyromegaly Lungs:    Clear to auscultation bilaterally; normal respiratory effort CV:         Regular rate and rhythm; no murmurs Abd:      + bowel sounds; soft, non-tender; no palpable masses, no distension Ext:    No edema; adequate peripheral perfusion Skin:      Warm and dry; no rash Neuro: alert and oriented x  3 Psych: normal mood and affect  Data Reviewed:  Reviewed labs, radiology imaging, old records and pertinent past GI work up   Assessment and Plan/Recommendations:  76 year old female with history of Crohn's colitis here for follow-up visit Patient is currently in clinical remission on sulfasalazine We will decrease the sulfasalazine dose to 500 mg twice daily She felt better on Asacol but the medication cost was higher Follow-up CBC, CMP, CRP,  B12, folate and ferritin.  Constipation with hard stool and pellets: Advise patient to increase dietary fluid and fiber intake  Colorectal cancer screening: Higher risk with history of Crohn's colitis for greater than 20 years and personal history of colon polyps She is past due for surveillance colonoscopy Patient wants to check with her insurance first regarding her out of pocket expense and will call to schedule  Return in 6-12 months or sooner if needed  25 minutes was spent face-to-face with the patient. Greater than 50% of the time used for counseling as well as treatment plan and follow-up. She had multiple questions which were answered to her satisfaction  K. Denzil Magnuson , MD [phone] Mon-Fri 8a-5p [phone] after 5p, weekends, holidays  CC: Deloria Lair., MD

## 2017-03-13 NOTE — Patient Instructions (Addendum)
It has been recommended to you by your physician that you have a(n) colonoscopy completed. Per your request, we did not schedule the procedure(s) today. Please contact our office at [phone] should you decide to have the procedure completed.  We have given you printed lab orders to take to Dr. Rayna Sexton office.  Please have the results faxed to [phone] attention Robin.  We have sent the following medications to your pharmacy for you to pick up at your convenience: Decrease your Sulfasalazine to 500 mg, twice a day.  Normal BMI (Body Mass Index- based on height and weight) is between 19 and 25. Your BMI today is Body mass index is 34.38 kg/m. Marland Kitchen Please consider follow up  regarding your BMI with your Primary Care Provider.

## 2017-03-14 ENCOUNTER — Telehealth: Payer: Self-pay | Admitting: Gastroenterology

## 2017-03-14 NOTE — Telephone Encounter (Signed)
Called patient back, she said she was just wondering if she had to go to [facility] to get labs drawn, I told her wherever she goes to have them drawn just make sure she asks them if they use [facility] to process the labs but she can have them drawn at her PCP or Shelley [name] which ever she prefers...Marland KitchenMarland KitchenMarland Kitchen I printed out lab orders for her to take to PCP yesterday , they will fax [place] the results

## 2017-03-18 ENCOUNTER — Other Ambulatory Visit: Payer: Self-pay

## 2017-03-18 MED ORDER — METOPROLOL SUCCINATE ER 25 MG PO TB24
25.0000 mg | ORAL_TABLET | Freq: Every day | ORAL | 3 refills | Status: DC
Start: 2017-03-18 — End: 2018-03-12

## 2017-03-24 DIAGNOSIS — E785 Hyperlipidemia, unspecified: Secondary | ICD-10-CM | POA: Diagnosis not present

## 2017-03-24 DIAGNOSIS — I10 Essential (primary) hypertension: Secondary | ICD-10-CM | POA: Diagnosis not present

## 2017-03-24 DIAGNOSIS — M5136 Other intervertebral disc degeneration, lumbar region: Secondary | ICD-10-CM | POA: Diagnosis not present

## 2017-03-24 DIAGNOSIS — R7301 Impaired fasting glucose: Secondary | ICD-10-CM | POA: Diagnosis not present

## 2017-03-24 DIAGNOSIS — K5090 Crohn's disease, unspecified, without complications: Secondary | ICD-10-CM | POA: Diagnosis not present

## 2017-03-24 DIAGNOSIS — E538 Deficiency of other specified B group vitamins: Secondary | ICD-10-CM | POA: Diagnosis not present

## 2017-04-01 ENCOUNTER — Telehealth: Payer: Self-pay | Admitting: Gastroenterology

## 2017-04-01 NOTE — Telephone Encounter (Signed)
Labs were done at another site and faxed. They are scanned in under Media. Please advise. Thanks

## 2017-04-02 DIAGNOSIS — Z1231 Encounter for screening mammogram for malignant neoplasm of breast: Secondary | ICD-10-CM | POA: Diagnosis not present

## 2017-04-02 DIAGNOSIS — Z803 Family history of malignant neoplasm of breast: Secondary | ICD-10-CM | POA: Diagnosis not present

## 2017-04-15 ENCOUNTER — Telehealth: Payer: Self-pay | Admitting: Gastroenterology

## 2017-04-16 NOTE — Telephone Encounter (Signed)
Lft wnl and I do not have the prior lab values to compare. It may be easier next time to do labs within [facility] and I can review them sooner. Please inform patient the results. Thanks

## 2017-04-16 NOTE — Telephone Encounter (Signed)
Patient had her labs drawn at another facility and faxed to [place]. They are scanned in under the lab section. Thank you.

## 2017-04-17 NOTE — Telephone Encounter (Signed)
Patient is advised.She thanks me for the call.

## 2017-07-02 DIAGNOSIS — J301 Allergic rhinitis due to pollen: Secondary | ICD-10-CM | POA: Diagnosis not present

## 2017-07-02 DIAGNOSIS — I6523 Occlusion and stenosis of bilateral carotid arteries: Secondary | ICD-10-CM | POA: Diagnosis not present

## 2017-07-02 DIAGNOSIS — E119 Type 2 diabetes mellitus without complications: Secondary | ICD-10-CM | POA: Diagnosis not present

## 2017-07-07 DIAGNOSIS — Z85820 Personal history of malignant melanoma of skin: Secondary | ICD-10-CM | POA: Diagnosis not present

## 2017-07-07 DIAGNOSIS — Z1283 Encounter for screening for malignant neoplasm of skin: Secondary | ICD-10-CM | POA: Diagnosis not present

## 2017-07-21 ENCOUNTER — Encounter: Payer: Self-pay | Admitting: Family

## 2017-07-21 ENCOUNTER — Ambulatory Visit: Payer: PPO | Admitting: Family

## 2017-07-21 ENCOUNTER — Ambulatory Visit (HOSPITAL_COMMUNITY)
Admission: RE | Admit: 2017-07-21 | Discharge: 2017-07-21 | Disposition: A | Discharge status: Home / Self Care | Payer: PPO | Source: Ambulatory Visit | Attending: Family | Admitting: Family

## 2017-07-21 VITALS — BP 156/74 | HR 65 | Temp 97.0°F | Resp 17 | Wt 202.4 lb

## 2017-07-21 DIAGNOSIS — F17200 Nicotine dependence, unspecified, uncomplicated: Secondary | ICD-10-CM

## 2017-07-21 DIAGNOSIS — Z8673 Personal history of transient ischemic attack (TIA), and cerebral infarction without residual deficits: Secondary | ICD-10-CM | POA: Insufficient documentation

## 2017-07-21 DIAGNOSIS — I6523 Occlusion and stenosis of bilateral carotid arteries: Secondary | ICD-10-CM | POA: Insufficient documentation

## 2017-07-21 LAB — VAS US CAROTID
LCCADDIAS: 20 cm/s
LCCAPSYS: 85 cm/s
LEFT ECA DIAS: -16 cm/s
Left CCA dist sys: 85 cm/s
Left CCA prox dias: 22 cm/s
Left ICA dist dias: -27 cm/s
Left ICA dist sys: -77 cm/s
Left ICA prox dias: -60 cm/s
Left ICA prox sys: -181 cm/s
RCCADSYS: -59 cm/s
RCCAPSYS: 92 cm/s
RIGHT CCA MID DIAS: 26 cm/s
RIGHT ECA DIAS: -16 cm/s
Right CCA prox dias: 28 cm/s

## 2017-07-21 NOTE — Patient Instructions (Signed)
Steps to Quit Smoking Smoking tobacco can be bad for your health. It can also affect almost every organ in your body. Smoking puts you and people around you at risk for many serious long-lasting (chronic) diseases. Quitting smoking is hard, but it is one of the best things that you can do for your health. It is never too late to quit. What are the benefits of quitting smoking? When you quit smoking, you lower your risk for getting serious diseases and conditions. They can include:  Lung cancer or lung disease.  Heart disease.  Stroke.  Heart attack.  Not being able to have children (infertility).  Weak bones (osteoporosis) and broken bones (fractures).  If you have coughing, wheezing, and shortness of breath, those symptoms may get better when you quit. You may also get sick less often. If you are pregnant, quitting smoking can help to lower your chances of having a baby of low birth weight. What can I do to help me quit smoking? Talk with your doctor about what can help you quit smoking. Some things you can do (strategies) include:  Quitting smoking totally, instead of slowly cutting back how much you smoke over a period of time.  Going to in-person counseling. You are more likely to quit if you go to many counseling sessions.  Using resources and support systems, such as: ? Database administrator with a Social worker. ? Phone quitlines. ? Careers information officer. ? Support groups or group counseling. ? Text messaging programs. ? Mobile phone apps or applications.  Taking medicines. Some of these medicines may have nicotine in them. If you are pregnant or breastfeeding, do not take any medicines to quit smoking unless your doctor says it is okay. Talk with your doctor about counseling or other things that can help you.  Talk with your doctor about using more than one strategy at the same time, such as taking medicines while you are also going to in-person counseling. This can help make  quitting easier. What things can I do to make it easier to quit? Quitting smoking might feel very hard at first, but there is a lot that you can do to make it easier. Take these steps:  Talk to your family and friends. Ask them to support and encourage you.  Call phone quitlines, reach out to support groups, or work with a Social worker.  Ask people who smoke to not smoke around you.  Avoid places that make you want (trigger) to smoke, such as: ? Bars. ? Parties. ? Smoke-break areas at work.  Spend time with people who do not smoke.  Lower the stress in your life. Stress can make you want to smoke. Try these things to help your stress: ? Getting regular exercise. ? Deep-breathing exercises. ? Yoga. ? Meditating. ? Doing a body scan. To do this, close your eyes, focus on one area of your body at a time from head to toe, and notice which parts of your body are tense. Try to relax the muscles in those areas.  Download or buy apps on your mobile phone or tablet that can help you stick to your quit plan. There are many free apps, such as QuitGuide from the [employer] Office manager for Disease Control and Prevention). You can find more support from smokefree.gov and other websites.  This information is not intended to replace advice given to you by your health care provider. Make sure you discuss any questions you have with your health care provider. Document Released: 06/08/2009 Document  Revised: 04/09/2016 Document Reviewed: 12/27/2014 Elsevier Interactive Patient Education  2018 [employer].     Stroke Prevention Some health problems and behaviors may make it more likely for you to have a stroke. Below are ways to lessen your risk of having a stroke.  Be active for at least 30 minutes on most or all days.  Do not smoke. Try not to be around others who smoke.  Do not drink too much alcohol. ? Do not have more than 2 drinks a day if you are a man. ? Do not have more than 1 drink a day if you  are a woman and are not pregnant.  Eat healthy foods, such as fruits and vegetables. If you were put on a specific diet, follow the diet as told.  Keep your cholesterol levels under control through diet and medicines. Look for foods that are low in saturated fat, trans fat, cholesterol, and are high in fiber.  If you have diabetes, follow all diet plans and take your medicine as told.  Ask your doctor if you need treatment to lower your blood pressure. If you have high blood pressure (hypertension), follow all diet plans and take your medicine as told by your doctor.  If you are 69-37 years old, have your blood pressure checked every 3-5 years. If you are age 32 or older, have your blood pressure checked every year.  Keep a healthy weight. Eat foods that are low in calories, salt, saturated fat, trans fat, and cholesterol.  Do not take drugs.  Avoid birth control pills, if this applies. Talk to your doctor about the risks of taking birth control pills.  Talk to your doctor if you have sleep problems (sleep apnea).  Take all medicine as told by your doctor. ? You may be told to take aspirin or blood thinner medicine. Take this medicine as told by your doctor. ? Understand your medicine instructions.  Make sure any other conditions you have are being taken care of.  Get help right away if:  You suddenly lose feeling (you feel numb) or have weakness in your face, arm, or leg.  Your face or eyelid hangs down to one side.  You suddenly feel confused.  You have trouble talking (aphasia) or understanding what people are saying.  You suddenly have trouble seeing in one or both eyes.  You suddenly have trouble walking.  You are dizzy.  You lose your balance or your movements are clumsy (uncoordinated).  You suddenly have a very bad headache and you do not know the cause.  You have new chest pain.  Your heart feels like it is fluttering or skipping a beat (irregular  heartbeat). Do not wait to see if the symptoms above go away. Get help right away. Call your local emergency services (911 in [place] Do not drive yourself to the hospital. This information is not intended to replace advice given to you by your health care provider. Make sure you discuss any questions you have with your health care provider. Document Released: 02/11/2012 Document Revised: 01/18/2016 Document Reviewed: 02/12/2013 Elsevier Interactive Patient Education  [employer].

## 2017-07-21 NOTE — Progress Notes (Signed)
Chief Complaint: Follow up Extracranial Carotid Artery Stenosis   [name] of Present Illness  Shelley Shelley [name] is a 76 y.o. female whom Shelley Shelley [name] has been monitoring with a [name] of carotid artery stenosis and Shelley Shelley [name] of carotid intervention.  Shelley Shelley [name] returns for surveillance.  Shelley Shelley [name] does have a [name] of MI and 2 cardiac stents placed in 2004 and TIA symptoms many years ago manifested as transient right arm weakness, but Shelley Shelley [name] of [name].  Shelley Shelley [name] has had Shelley Shelley [name] or TIA symptoms.  Shelley Shelley Shelley [name] a [name] of amaurosis fugax or monocular blindness, unilateral facial drooping, or receptive or expressive aphasia. ..  Shelley Shelley Shelley [name].  Shelley Shelley [name] non-healing wounds.   Shelley Shelley [name] has fallen a few times as her legs seem to give way, states this is due to her knee and back issues.  Shelley Shelley [name] lives in a 3 story house and climbs stairs several times/day, but her ambulation is otherwise limited by arthritis and pain from lumbar spine problems: DDD, HNP, arthritis, left leg sciatica.   Shelley Shelley [name] was receiving physical therapy due to frequent falls; states her last fall was June of 2017.   Shelley Shelley [name] any more dyspnea than usual, Shelley [name] chest pain, states Shelley Shelley [name] has a Shelley [name]'s cough, but has been bothered by more cough this Spring with allergies, Claritin helps.  Shelley Shelley [name], last A1C was 5.6 on 08-12-16(review of records) Shelley Shelley [name]: Shelley [name] (decreased to 1-3cigarette/day, started in high school, quit several times)  Shelley Shelley [name] include:  Statin : yes Betablocker: Yes  ASA: Yes  Other anticoagulants/antiplatelets: Plavix   Past Medical [name]:  Diagnosis Date  . Anxiety   . Carotid artery disease ([place])    09-47% RICA and LICA 0/96 - Shelley Shelley [name]  . Cataract   . COPD (chronic obstructive pulmonary disease) ([place])   . Coronary atherosclerosis of native coronary artery    BMS to RCA 2004  . Depression   . DVT (deep venous thrombosis) ([place])   .  Essential hypertension, benign   . Fracture Left Great Toe  . Hepatic steatosis   . [name] of melanoma   . [name] of oral aphthous ulcers   . [name] of Salmonella gastroenteritis   . Hx of adenomatous colonic polyps   . Mixed hyperlipidemia   . Myocardial infarction ([place])    IMI 10/04  . Osteoarthritis   . Osteoporosis   . Pancreatitis   . Rectal bleeding   . Regional enteritis of large intestine ([place])   . Sinus polyp   . TIA (transient ischemic attack)   . Tubular adenoma of colon 08/2012    Social [name] Social [name]   Tobacco Use  . Smoking status: Light Tobacco Shelley [name]    Packs/day: 0.25    Years: 40.00    Pack years: 10.00    Types: Cigarettes    Start date: 06/02/1957  . Smokeless tobacco: Never Used  . Tobacco comment: trying to quit 1/2 pack day - so much stress in her lift  Substance Use Topics  . Alcohol use: Shelley [name]    Alcohol/week: 0.0 oz  . Drug use: Shelley [name]    Family [name] Family [name]  Problem Relation Age of Onset  . Diabetes type II Mother   . Heart attack Mother 22       questionable  . Uterine cancer Mother   . Heart disease Mother        before age 61  . Varicose Veins Mother   . Other Brother  PVD and hx DVT  . Deep vein thrombosis Brother   . Breast cancer Sister   . Hyperlipidemia Sister   . Hypertension Sister   . Colon cancer Neg Hx     Surgical [name] Past Surgical [name]:  Procedure Laterality Date  . CATARACT EXTRACTION Bilateral   . Melanoma resection  1996  . TOTAL KNEE ARTHROPLASTY Right Feb 2012    Allergies  Allergen Reactions  . Mercaptopurine     REACTION: pancreatitis  . Sulfa Antibiotics   . Penicillins Rash    REACTION: rash    Current Outpatient Medications  Medication Sig Dispense Refill  . aspirin 81 MG tablet Take 81 mg by mouth daily.      . clopidogrel (PLAVIX) 75 MG tablet Take 1 tablet (75 mg total) by mouth daily. 30 tablet 6  . Cyanocobalamin (B-12 PO) Take 1 capsule every day x 7 days  every other week    . dicyclomine (BENTYL) 10 MG capsule Take 1 capsule (10 mg total) by mouth as needed for spasms. 30 capsule 3  . ferrous sulfate 325 (65 FE) MG tablet Take 325 mg by mouth daily.    Marland Kitchen Fexofenadine HCl (ALLEGRA ALLERGY PO) Take 1 tablet by mouth daily as needed. Reported on 01/04/2016    . FLUoxetine (PROZAC) 40 MG capsule Take 40 mg by mouth daily.    Marland Kitchen FLUZONE QUADRIVALENT 0.5 ML injection See admin instructions.  0  . folic acid (FOLVITE) 1 MG tablet Take 1 tablet (1 mg total) by mouth daily. 90 tablet 4  . HYDROcodone-acetaminophen (NORCO) 10-325 MG tablet Take 1 tablet by mouth every 6 (six) hours as needed.    . metoprolol succinate (TOPROL-XL) 25 MG 24 hr tablet Take 1 tablet (25 mg total) by mouth daily. 90 tablet 3  . Misc Natural Products (OSTEO BI-FLEX JOINT SHIELD PO) Take 1 capsule by mouth daily.    . nitroGLYCERIN (NITROSTAT) 0.4 MG SL tablet Place 1 tablet (0.4 mg total) under Shelley tongue every 5 (five) minutes x 3 doses as needed. 25 tablet 3  . Omega-3 Fatty Acids (FISH OIL) 1200 MG CAPS Take 1 capsule by mouth daily.      . rosuvastatin (CRESTOR) 10 MG tablet Take 10 mg by mouth daily.    Marland Kitchen sulfaSALAzine (AZULFIDINE) 500 MG tablet Take 1 tablet (500 mg total) by mouth 2 (two) times daily. 60 tablet 6   Shelley [name] current facility-administered medications for this visit.     Review of Systems : See HPI for pertinent positives and negatives.  Physical Examination  Vitals:   07/21/17 1136 07/21/17 1141  BP: 134/70 (!) 156/74  Pulse: 65   Resp: 17   Temp: (!) 97 F (36.1 C)   TempSrc: Oral   SpO2: 93%   Weight: 202 lb 6.4 oz (91.8 kg)    Body mass index is 33.68 kg/m.  General: WDWN obese female in NAD. GAIT:uses a walker, antalgic.  Eyes: PERRLA Pulmonary: Respirations are non labored,adequate air movement in all fields, Shelley [name] rales, rhonchi, or wheezes.  Cardiac: regular rhythm, Shelley [name] detected murmur.  VASCULAR EXAM Carotid Bruits Left  Right   Negative  positive   Abdominal aortic pulse is not palpable.  Radial pulses are 2+ palpable and equal.   LE Pulses  LEFT  RIGHT   POPLITEAL  not palpable  not palpable  POSTERIOR TIBIAL  not palpable  not palpable   DORSALIS PEDIS ANTERIOR TIBIAL  Not palpable  Not palpable  Gastrointestinal: soft, nontender, BS WNL, Shelley [name] r/g, Shelley [name] palpated masses.  Musculoskeletal: Shelley [name] muscle atrophy/wasting. M/S 5/5 throughout except left lower extremity is 4/5, Extremities without ischemic changes.  Skin: Shelley [name] rashes, Shelley [name] cellulitis, Shelley [name] ulcers, Shelley [name] lower extremity edema.   Neurologic: A&O X 3; appropriate affect, speech is normal, CN 2-12 intact, Pain and light touch intact in extremities, Motor exam as listed above.    Assessment: KENADY DOXTATER is a 76 y.o. female who has a [name] of a remote TIA, none subsequently.   Her atherosclerotic risk factors include current smoking (since in her teens), CAD, COPD, and obesity.   Shelley Shelley [name] has been elevating her legs and has Shelley [name] lower extremity edema today as Shelley Shelley [name] did at a previous visit.   DATA Carotid Duplex (07/21/17): 60-79% right ICA stenosis and 40-59% left ICA stenosis. Bilateral vertebral artery flow is antegrade. Bilateral subclavian artery waveforms are normal. Shelley [name] significant change compared to Shelley exams on 01-04-16,07-11-16, and 01-09-17.   Plan: Follow-up in 14monthwith Carotid Duplex scan.  Daily seated leg exercises as discussed and demonstrated.  Shelley Shelley [name] was counseled re smoking cessation and given several free resources re smoking cessation.   I discussed in depth with Shelley Shelley [name] Shelley nature of atherosclerosis, and emphasized Shelley importance of maximal medical management including strict control of blood pressure, blood glucose, and lipid levels, obtaining regular exercise, and cessation of smoking.  Shelley Shelley [name] is aware that without maximal medical management Shelley  underlying atherosclerotic disease process will progress, limiting Shelley benefit of any interventions. Shelley Shelley [name] was given information about [name] prevention and what symptoms should prompt Shelley Shelley [name] to seek immediate medical care. Thank you for allowing [facility] participate in this Shelley [name]'s care.  SClemon Chambers RN, MSN, FNP-C [facility] GNewsomsOffice: [phone] Clinic Physician: BTrula Slade 07/21/17 11:52 AM

## 2017-07-22 NOTE — Addendum Note (Signed)
Addended by: Lianne Cure A on: 07/22/2017 04:38 PM   Modules accepted: Orders

## 2017-07-24 ENCOUNTER — Encounter (HOSPITAL_COMMUNITY): Payer: PPO

## 2017-07-24 ENCOUNTER — Ambulatory Visit: Payer: PPO | Admitting: Family

## 2017-08-25 ENCOUNTER — Encounter: Payer: Self-pay | Admitting: Cardiology

## 2017-08-25 ENCOUNTER — Ambulatory Visit: Payer: PPO | Admitting: Cardiology

## 2017-08-25 VITALS — BP 129/69 | HR 63 | Ht 65.0 in | Wt 215.6 lb

## 2017-08-25 DIAGNOSIS — E782 Mixed hyperlipidemia: Secondary | ICD-10-CM | POA: Diagnosis not present

## 2017-08-25 DIAGNOSIS — I10 Essential (primary) hypertension: Secondary | ICD-10-CM

## 2017-08-25 DIAGNOSIS — I6523 Occlusion and stenosis of bilateral carotid arteries: Secondary | ICD-10-CM

## 2017-08-25 DIAGNOSIS — I25119 Atherosclerotic heart disease of native coronary artery with unspecified angina pectoris: Secondary | ICD-10-CM

## 2017-08-25 NOTE — Patient Instructions (Signed)

## 2017-08-25 NOTE — Progress Notes (Signed)
Cardiology Office Note  Date: 08/25/2017   ID: [name], DOB June 17, 1941, MRN [id number]  PCP: Deloria Lair., MD  Primary Cardiologist: Rozann Lesches, MD   Chief Complaint  Patient presents with  . Coronary Artery Disease    History of Present Illness: Shelley [name] is a 76 y.o. female last seen in June. She presents for a routine follow-up visit. Reports no angina symptoms or nitroglycerin use. Mainly limited by arthritis pains in her legs and lower back. She is using a walker today. She reports NYHA class II dyspnea. No palpitations or syncope.   She had follow-up with [facility] in November. Carotid Dopplers at that time revealed 60-79% RICA stenosis and 16-96% LICA stenosis. She remains on aspirin and Plavix.  I personally reviewed her ECG today which shows sinus rhythm with prolonged PR interval, possible old inferior infarct pattern, poor R-wave progression, nonspecific ST changes.  I reviewed her medications which are outlined below. Lab work from July showed good lipid control on Crestor.  Past Medical History:  Diagnosis Date  . Anxiety   . Carotid artery disease ([place])    78-93% RICA and LICA 8/10 - Dr. Oneida Alar  . Cataract   . COPD (chronic obstructive pulmonary disease) ([place])   . Coronary atherosclerosis of native coronary artery    BMS to RCA 2004  . Depression   . DVT (deep venous thrombosis) ([place])   . Essential hypertension, benign   . Fracture Left Great Toe  . Hepatic steatosis   . History of melanoma   . History of oral aphthous ulcers   . History of Salmonella gastroenteritis   . Hx of adenomatous colonic polyps   . Mixed hyperlipidemia   . Myocardial infarction ([place])    IMI 10/04  . Osteoarthritis   . Osteoporosis   . Pancreatitis   . Rectal bleeding   . Regional enteritis of large intestine ([place])   . Sinus polyp   . TIA (transient ischemic attack)   . Tubular adenoma of colon 08/2012    Past Surgical History:  Procedure Laterality  Date  . CATARACT EXTRACTION Bilateral   . Melanoma resection  1996  . TOTAL KNEE ARTHROPLASTY Right Feb 2012    Current Outpatient Medications  Medication Sig Dispense Refill  . aspirin 81 MG tablet Take 81 mg by mouth daily.      . clopidogrel (PLAVIX) 75 MG tablet Take 1 tablet (75 mg total) by mouth daily. 30 tablet 6  . Cyanocobalamin (B-12 PO) Take 1 capsule every day x 7 days every other week    . dicyclomine (BENTYL) 10 MG capsule Take 1 capsule (10 mg total) by mouth as needed for spasms. 30 capsule 3  . ferrous sulfate 325 (65 FE) MG tablet Take 325 mg by mouth daily.    Marland Kitchen Fexofenadine HCl (ALLEGRA ALLERGY PO) Take 1 tablet by mouth daily as needed. Reported on 01/04/2016    . FLUoxetine (PROZAC) 40 MG capsule Take 40 mg by mouth daily.    Marland Kitchen FLUZONE QUADRIVALENT 0.5 ML injection See admin instructions.  0  . folic acid (FOLVITE) 1 MG tablet Take 1 tablet (1 mg total) by mouth daily. 90 tablet 4  . HYDROcodone-acetaminophen (NORCO) 10-325 MG tablet Take 1 tablet by mouth every 6 (six) hours as needed.    . metoprolol succinate (TOPROL-XL) 25 MG 24 hr tablet Take 1 tablet (25 mg total) by mouth daily. 90 tablet 3  . Misc Natural Products (OSTEO BI-FLEX  JOINT SHIELD PO) Take 1 capsule by mouth daily.    . nitroGLYCERIN (NITROSTAT) 0.4 MG SL tablet Place 1 tablet (0.4 mg total) under the tongue every 5 (five) minutes x 3 doses as needed. 25 tablet 3  . Omega-3 Fatty Acids (FISH OIL) 1200 MG CAPS Take 1 capsule by mouth daily.      . rosuvastatin (CRESTOR) 10 MG tablet Take 10 mg by mouth daily.    Marland Kitchen sulfaSALAzine (AZULFIDINE) 500 MG tablet Take 1 tablet (500 mg total) by mouth 2 (two) times daily. 60 tablet 6   No current facility-administered medications for this visit.    Allergies:  Mercaptopurine; Sulfa antibiotics; and Penicillins   Social History: The patient  reports that she has been smoking cigarettes.  She started smoking about 60 years ago. She has a 10.00 pack-year  smoking history. she has never used smokeless tobacco. She reports that she does not drink alcohol or use drugs.   ROS:  Please see the history of present illness. Otherwise, complete review of systems is positive for hearing loss.  All other systems are reviewed and negative.   Physical Exam: VS:  BP 129/69   Pulse 63   Ht _0  (1.651 m)   Wt 215 lb 9.6 oz (97.8 kg)   BMI 35.88 kg/m , BMI Body mass index is 35.88 kg/m.  Wt Readings from Last 3 Encounters:  08/25/17 215 lb 9.6 oz (97.8 kg)  07/21/17 202 lb 6.4 oz (91.8 kg)  03/13/17 206 lb 9.6 oz (93.7 kg)    General: Patient appears comfortable at rest. Using a walker today. HEENT: Conjunctiva and lids normal, oropharynx clear. Neck: Supple, no elevated JVP, bilateral carotid bruits, no thyromegaly. Lungs: Decreased breath sounds without wheezing, nonlabored breathing at rest. Cardiac: Regular rate and rhythm, no S3 or significant systolic murmur. Abdomen: Soft, nontender, bowel sounds present. Extremities: Mild ankle edema, distal pulses 2+. Skin: Warm and dry. Musculoskeletal: No kyphosis. Neuropsychiatric: Alert and oriented x3, affect grossly appropriate.  ECG: I personally reviewed the tracing from 08/06/2016 which showed sinus rhythm with left bundle branch block.  Recent Labwork:  July 2018: BUN 12, creatinine 0.6, AST 10, ALT 5, potassium 4.8, hemoglobin 12.4, platelets 228, triglycerides 67, cholesterol 128, HDL 67, LDL 47   Other Studies Reviewed Today:  Lexiscan Myoview 08/11/2015:  There was no ST segment deviation noted during stress.  Defect 1: There is a medium defect of moderate severity present in the basal inferoseptal, basal inferior, mid inferior and apical inferior location.  Findings consistent with prior myocardial infarction.  This is a low risk study.  Nuclear stress EF: 52%. Inferior wall hypokinesis noted.  Assessment and Plan:  1. CAD with history of BMS to the RCA in 2004. Ischemic  testing from within the last 2 years showed evidence of inferior wall scar without residual ischemia and normal LVEF. Continue with medical therapy and observation.  2. Bilateral carotid artery disease, continues to follow with [facility], recent carotid Dopplers are noted above. Maintain antiplatelet regimen and statin therapy.  3. Essential hypertension, blood pressure control is adequate today.  4. Hyperlipidemia, well controlled on Crestor.  Current medicines were reviewed with the patient today.   Orders Placed This Encounter  Procedures  . EKG 12-Lead    Disposition: Follow-up in 6 months.  Signed, Satira Sark, MD, [facility] 08/25/2017 9:44 AM    [facility] [place], [place], [place] [address] Phone: [phone]; Fax: [phone])  [phone]

## 2017-09-16 ENCOUNTER — Telehealth: Payer: Self-pay | Admitting: Cardiology

## 2017-09-16 ENCOUNTER — Ambulatory Visit: Payer: PPO | Admitting: Cardiology

## 2017-09-16 ENCOUNTER — Encounter: Payer: Self-pay | Admitting: Cardiology

## 2017-09-16 VITALS — BP 140/82 | HR 59 | Ht 65.0 in | Wt 231.2 lb

## 2017-09-16 DIAGNOSIS — I2510 Atherosclerotic heart disease of native coronary artery without angina pectoris: Secondary | ICD-10-CM | POA: Diagnosis not present

## 2017-09-16 DIAGNOSIS — M7989 Other specified soft tissue disorders: Secondary | ICD-10-CM | POA: Diagnosis not present

## 2017-09-16 DIAGNOSIS — E782 Mixed hyperlipidemia: Secondary | ICD-10-CM

## 2017-09-16 DIAGNOSIS — I10 Essential (primary) hypertension: Secondary | ICD-10-CM | POA: Diagnosis not present

## 2017-09-16 MED ORDER — POTASSIUM CHLORIDE ER 10 MEQ PO TBCR: 10.0000 meq | EXTENDED_RELEASE_TABLET | Freq: Every day | ORAL | 0 refills | Status: DC

## 2017-09-16 MED ORDER — FUROSEMIDE 40 MG PO TABS: 40.0000 mg | ORAL_TABLET | Freq: Every day | ORAL | 0 refills | Status: DC

## 2017-09-16 NOTE — Patient Instructions (Signed)
Medication Instructions:   Your physician has recommended you make the following change in your medication.  Start furosemide 40 mg by mouth daily for 7 days.  Start potassium 10 meq by mouth daily for 7 days.  Continue all other medications the same.  Labwork:  NONE  Testing/Procedures: Your physician has requested that you have an echocardiogram. Echocardiography is a painless test that uses sound waves to create images of your heart. It provides your doctor with information about the size and shape of your heart and how well your heart's chambers and valves are working. This procedure takes approximately one hour. There are no restrictions for this procedure.  Follow-Up:  Your physician recommends that you schedule a follow-up appointment in: pending results.  Any Other Special Instructions Will Be Listed Below (If Applicable).  If you need a refill on your cardiac medications before your next appointment, please call your pharmacy.

## 2017-09-16 NOTE — Telephone Encounter (Signed)
Pre-cert Verification for the following procedure   Echo scheduled for 09-24-2017

## 2017-09-16 NOTE — Progress Notes (Signed)
Cardiology Office Note  Date: 09/16/2017   ID: JARETZY LHOMMEDIEU, DOB 05/31/1941, MRN [id number]  PCP: Deloria Lair., MD  Primary Cardiologist: Rozann Lesches, MD   Chief Complaint  Patient presents with  . Coronary Artery Disease    History of Present Illness: Shelley [name] is a 77 y.o. female that I saw just recently in late December 2018 for routine follow-up. Shelley [name] presents to the office reporting worsening leg swelling and by our scales about a 15 pound weight gain. Shelley [name] said the only change that has occurred recently is that Shelley [name] started using some hemp oil for arthritic pain. Could have been coincidental. Shelley [name] has not had any dietary indiscretions or increased sodium use. Shelley [name] is not on a diuretic.  Last assessment of LVEF was by Myoview in 2016 at 52%. Shelley [name] has not had a recent echocardiogram.  I reviewed her medications which are outlined below and unchanged from a cardiac perspective.  Past Medical History:  Diagnosis Date  . Anxiety   . Carotid artery disease ([place])    23-55% RICA and LICA 7/32 - Dr. Oneida Alar  . Cataract   . COPD (chronic obstructive pulmonary disease) ([place])   . Coronary atherosclerosis of native coronary artery    BMS to RCA 2004  . Depression   . DVT (deep venous thrombosis) ([place])   . Essential hypertension, benign   . Fracture Left Great Toe  . Hepatic steatosis   . History of melanoma   . History of oral aphthous ulcers   . History of Salmonella gastroenteritis   . Hx of adenomatous colonic polyps   . Mixed hyperlipidemia   . Myocardial infarction ([place])    IMI 10/04  . Osteoarthritis   . Osteoporosis   . Pancreatitis   . Rectal bleeding   . Regional enteritis of large intestine ([place])   . Sinus polyp   . TIA (transient ischemic attack)   . Tubular adenoma of colon 08/2012    Past Surgical History:  Procedure Laterality Date  . CATARACT EXTRACTION Bilateral   . Melanoma resection  1996  . TOTAL KNEE ARTHROPLASTY Right Feb 2012     Current Outpatient Medications  Medication Sig Dispense Refill  . aspirin 81 MG tablet Take 81 mg by mouth daily.      . clopidogrel (PLAVIX) 75 MG tablet Take 1 tablet (75 mg total) by mouth daily. 30 tablet 6  . Cyanocobalamin (B-12 PO) Take 1 capsule every day x 7 days every other week    . dicyclomine (BENTYL) 10 MG capsule Take 1 capsule (10 mg total) by mouth as needed for spasms. 30 capsule 3  . ferrous sulfate 325 (65 FE) MG tablet Take 325 mg by mouth daily.    Marland Kitchen Fexofenadine HCl (ALLEGRA ALLERGY PO) Take 1 tablet by mouth daily as needed. Reported on 01/04/2016    . FLUoxetine (PROZAC) 40 MG capsule Take 40 mg by mouth daily.    . folic acid (FOLVITE) 1 MG tablet Take 1 tablet (1 mg total) by mouth daily. 90 tablet 4  . HYDROcodone-acetaminophen (NORCO) 10-325 MG tablet Take 1 tablet by mouth every 6 (six) hours as needed.    . metoprolol succinate (TOPROL-XL) 25 MG 24 hr tablet Take 1 tablet (25 mg total) by mouth daily. 90 tablet 3  . Misc Natural Products (OSTEO BI-FLEX JOINT SHIELD PO) Take 1 capsule by mouth daily.    . nitroGLYCERIN (NITROSTAT) 0.4 MG SL tablet Place 1 tablet (0.4 mg  total) under the tongue every 5 (five) minutes x 3 doses as needed. 25 tablet 3  . Omega-3 Fatty Acids (FISH OIL) 1200 MG CAPS Take 1 capsule by mouth daily.      . rosuvastatin (CRESTOR) 10 MG tablet Take 10 mg by mouth daily.    Marland Kitchen sulfaSALAzine (AZULFIDINE) 500 MG tablet Take 1 tablet (500 mg total) by mouth 2 (two) times daily. 60 tablet 6  . furosemide (LASIX) 40 MG tablet Take 1 tablet (40 mg total) by mouth daily for 7 days. For 7 days 7 tablet 0  . potassium chloride (K-DUR) 10 MEQ tablet Take 1 tablet (10 mEq total) by mouth daily for 7 days. For 7 days 7 tablet 0   No current facility-administered medications for this visit.    Allergies:  Mercaptopurine; Sulfa antibiotics; and Penicillins   Social History: The patient  reports that Shelley [name] has been smoking cigarettes.  Shelley [name] started  smoking about 60 years ago. Shelley [name] has a 10.00 pack-year smoking history. Shelley [name] has never used smokeless tobacco. Shelley [name] reports that Shelley [name] does not drink alcohol or use drugs.   ROS:  Please see the history of present illness. Otherwise, complete review of systems is positive for arthritic pain and stiffness.  All other systems are reviewed and negative.   Physical Exam: VS:  BP 140/82   Pulse (!) 59   Ht _0  (1.651 m)   Wt 231 lb 3.2 oz (104.9 kg)   SpO2 92%   BMI 38.47 kg/m , BMI Body mass index is 38.47 kg/m.  Wt Readings from Last 3 Encounters:  09/16/17 231 lb 3.2 oz (104.9 kg)  08/25/17 215 lb 9.6 oz (97.8 kg)  07/21/17 202 lb 6.4 oz (91.8 kg)    General: Patient appears comfortable at rest. Using walker. HEENT: Conjunctiva and lids normal, oropharynx clear. Neck: Supple, no elevated JVP or carotid bruits, no thyromegaly. Lungs: Clear to auscultation, nonlabored breathing at rest. Cardiac: Regular rate and rhythm, no S3 or significant systolic murmur. Abdomen: Protuberant, nontender, bowel sounds present. Extremities: Bilateral lower leg edema and lymphedema, distal pulses 1+. Skin: Warm and dry. Musculoskeletal: No kyphosis. Neuropsychiatric: Alert and oriented x3, affect grossly appropriate.  ECG: I personally reviewed the tracing from 08/25/2017 which showed sinus rhythm with low voltage, old inferior infarct pattern and poor R-wave progression.  Recent Labwork:  July 2018: BUN 12, creatinine 0.6, AST 10, ALT 5, potassium 4.8, hemoglobin 12.4, platelets 228, triglycerides 67, cholesterol 128, HDL 67, LDL 47   Other Studies Reviewed Today:  Lexiscan Myoview 08/11/2015:  There was no ST segment deviation noted during stress.  Defect 1: There is a medium defect of moderate severity present in the basal inferoseptal, basal inferior, mid inferior and apical inferior location.  Findings consistent with prior myocardial infarction.  This is a low risk study.  Nuclear stress  EF: 52%. Inferior wall hypokinesis noted.  Assessment and Plan:  1. Lower leg edema/lymphedema. Onset fairly recent, uncertain if had anything to do with recent use of hemp oil or not. Plan is to initiate Lasix 40 mg daily with KCl 10 mEq daily for a course of 7 days. Follow-up with echocardiogram to reassess cardiac structure and function.  2. CAD with history of BMS to the RCA in 2004. Shelley [name] reports no active angina and had a low risk Myoview in 2016. LVEF 52% by that study.  3. Essential hypertension, no other changes made to medical regimen.  4. Mixed hyperlipidemia, Shelley [name] continues on Crestor.  Current medicines were reviewed with the patient today.   Orders Placed This Encounter  Procedures  . ECHOCARDIOGRAM COMPLETE    Disposition: Call with test results and determine next step.  Signed, Satira Sark, MD, [facility] 09/16/2017 11:56 AM    [facility] [place], [place], [place] [address] Phone: [phone]; Fax: [phone]

## 2017-09-24 ENCOUNTER — Telehealth: Payer: Self-pay

## 2017-09-24 ENCOUNTER — Telehealth: Payer: Self-pay | Admitting: Cardiology

## 2017-09-24 ENCOUNTER — Ambulatory Visit (INDEPENDENT_AMBULATORY_CARE_PROVIDER_SITE_OTHER): Payer: PPO

## 2017-09-24 ENCOUNTER — Ambulatory Visit (HOSPITAL_COMMUNITY)
Admission: RE | Admit: 2017-09-24 | Discharge: 2017-09-24 | Disposition: A | Discharge status: Home / Self Care | Payer: PPO | Source: Ambulatory Visit | Attending: Cardiology | Admitting: Cardiology

## 2017-09-24 ENCOUNTER — Other Ambulatory Visit (HOSPITAL_COMMUNITY)
Admission: RE | Admit: 2017-09-24 | Discharge: 2017-09-24 | Disposition: A | Discharge status: Home / Self Care | Payer: PPO | Source: Ambulatory Visit | Attending: Cardiology | Admitting: Cardiology

## 2017-09-24 ENCOUNTER — Other Ambulatory Visit: Payer: Self-pay

## 2017-09-24 DIAGNOSIS — I700 Atherosclerosis of aorta: Secondary | ICD-10-CM | POA: Diagnosis not present

## 2017-09-24 DIAGNOSIS — J8410 Pulmonary fibrosis, unspecified: Secondary | ICD-10-CM | POA: Insufficient documentation

## 2017-09-24 DIAGNOSIS — I2699 Other pulmonary embolism without acute cor pulmonale: Secondary | ICD-10-CM | POA: Insufficient documentation

## 2017-09-24 DIAGNOSIS — R911 Solitary pulmonary nodule: Secondary | ICD-10-CM | POA: Insufficient documentation

## 2017-09-24 DIAGNOSIS — R0602 Shortness of breath: Secondary | ICD-10-CM | POA: Diagnosis not present

## 2017-09-24 DIAGNOSIS — M7989 Other specified soft tissue disorders: Secondary | ICD-10-CM

## 2017-09-24 DIAGNOSIS — I2510 Atherosclerotic heart disease of native coronary artery without angina pectoris: Secondary | ICD-10-CM

## 2017-09-24 LAB — BASIC METABOLIC PANEL
ANION GAP: 12 (ref 5–15)
BUN: 14 mg/dL (ref 6–20)
CO2: 27 mmol/L (ref 22–32)
Calcium: 9.3 mg/dL (ref 8.9–10.3)
Chloride: 102 mmol/L (ref 101–111)
Creatinine, Ser: 0.74 mg/dL (ref 0.44–1.00)
GFR calc Af Amer: >60 mL/min (ref >60)
GFR calc non Af Amer: >60 mL/min (ref >60)
GLUCOSE: 112 mg/dL — AB (ref 65–99)
POTASSIUM: 3.8 mmol/L (ref 3.5–5.1)
Sodium: 141 mmol/L (ref 135–145)

## 2017-09-24 MED ORDER — IOPAMIDOL (ISOVUE-370) INJECTION 76%
100.0000 mL | Freq: Once | INTRAVENOUS | Status: AC | PRN
  Administered 2017-09-24: 100 mL via INTRAVENOUS

## 2017-09-24 NOTE — Telephone Encounter (Signed)
Last seen 09/16/2017.  Stated that she was given 7 days of the Furosemide.  Stated she did stop after 4-5 days due to looking better.  Swelling came back so she finished out the full 7 days.  Does not look like they are down as much as she thinks they should be.  Typically goes back down, but does come back.  Stated that she is keeping legs elevated but not above her heart.  She did have her echo this morning as well.  No c/o chest pain, dizziness, or sob .Marland Kitchen  Has not weighed herself at home.

## 2017-09-24 NOTE — Telephone Encounter (Signed)
Please see the resulted echocardiogram from this morning with plan.

## 2017-09-24 NOTE — Telephone Encounter (Signed)
-----  Message from Laurine Blazer, LPN sent at 09/17/4823 11:57 AM EST -----   ----- Message ----- From: Satira Sark, MD Sent: 09/24/2017  10:04 AM To: Merlene Laughter, LPN  Results reviewed.  LVEF normal at 55-60% with relatively mild diastolic dysfunction.  Importantly however she has significant right ventricular dysfunction and evidence of severe pulmonary hypertension which could explain her weight gain and progressive leg swelling.  Would recommend that she get a chest CTA to exclude pulmonary embolus since her symptoms were fairly recent in onset.  If this is not the case, we will likely refer her to the heart failure clinic for further workup of pulmonary hypertension.  She does have history of COPD which could certainly be contributing. A copy of this test should be forwarded to Deloria Lair., MD.

## 2017-09-24 NOTE — Telephone Encounter (Signed)
Patient notified. Routed to PCP. Chest cta ordered.

## 2017-09-24 NOTE — Telephone Encounter (Signed)
Patient notified of echo results and Dr. Chuck Hint recommendations.

## 2017-09-24 NOTE — Telephone Encounter (Signed)
Patient here for her test and wanted Dr Domenic Polite to know that the fluid pills don't seem to be working for her

## 2017-09-24 NOTE — Addendum Note (Signed)
Addended by: Acquanetta Chain on: 09/24/2017 01:20 PM   Modules accepted: Orders

## 2017-09-25 ENCOUNTER — Telehealth: Payer: Self-pay | Admitting: Cardiology

## 2017-09-25 DIAGNOSIS — I272 Pulmonary hypertension, unspecified: Secondary | ICD-10-CM

## 2017-09-25 NOTE — Telephone Encounter (Signed)
Patient thought she should see Dr Domenic Polite before June.

## 2017-09-26 NOTE — Telephone Encounter (Signed)
-----  Message from Merlene Laughter, LPN sent at 11/01/4663  7:42 AM EST -----   ----- Message ----- From: Satira Sark, MD Sent: 09/24/2017   3:55 PM To: Merlene Laughter, LPN  Results reviewed.  No evidence of pulmonary embolus.  There is description of chronic fibrotic changes within the lungs and a small nodule which is unlikely to be an acute finding at this time.  Since she has significant right ventricular dysfunction and severe pulmonary hypertension, please schedule her for evaluation in the Heart Failure clinic. She will likely need to have a right heart catheterization and additional studies to clarify her diagnosis and treatment options. A copy of this test should be forwarded to Deloria Lair., MD.

## 2017-09-26 NOTE — Telephone Encounter (Signed)
Patient notified. Referral placed. Routed to PCP.

## 2017-10-06 ENCOUNTER — Telehealth: Payer: Self-pay | Admitting: *Deleted

## 2017-10-06 MED ORDER — FUROSEMIDE 40 MG PO TABS: 40.0000 mg | ORAL_TABLET | Freq: Every day | ORAL | 0 refills | Status: DC

## 2017-10-06 NOTE — Telephone Encounter (Signed)
Pt aware - refill sent to [facility]

## 2017-10-06 NOTE — Telephone Encounter (Signed)
Yes, would go ahead and resume Lasix.

## 2017-10-06 NOTE — Telephone Encounter (Signed)
Pt says she has started swelling in abdomen, feet, and ankles - has gained about 6lbs in the last 3-4 days. Has upcoming appt with heart failure clinic 2/25 - wants to know if she should resume lasix ( took 40 mg for 1 week as per LOV) until she is seen by Dr Aundra Dubin. Denies worsening SOB/dizziness/CP

## 2017-10-20 ENCOUNTER — Other Ambulatory Visit (HOSPITAL_COMMUNITY): Payer: Self-pay

## 2017-10-20 ENCOUNTER — Encounter (HOSPITAL_COMMUNITY): Payer: Self-pay | Admitting: Cardiology

## 2017-10-20 ENCOUNTER — Encounter (HOSPITAL_COMMUNITY): Payer: Self-pay

## 2017-10-20 ENCOUNTER — Ambulatory Visit (HOSPITAL_COMMUNITY)
Admission: RE | Admit: 2017-10-20 | Discharge: 2017-10-20 | Disposition: A | Discharge status: Home / Self Care | Payer: PPO | Source: Ambulatory Visit | Attending: Cardiology | Admitting: Cardiology

## 2017-10-20 VITALS — BP 138/78 | HR 65 | Wt 210.8 lb

## 2017-10-20 DIAGNOSIS — I2510 Atherosclerotic heart disease of native coronary artery without angina pectoris: Secondary | ICD-10-CM | POA: Diagnosis not present

## 2017-10-20 DIAGNOSIS — I6529 Occlusion and stenosis of unspecified carotid artery: Secondary | ICD-10-CM | POA: Insufficient documentation

## 2017-10-20 DIAGNOSIS — Z86718 Personal history of other venous thrombosis and embolism: Secondary | ICD-10-CM | POA: Diagnosis not present

## 2017-10-20 DIAGNOSIS — M1990 Unspecified osteoarthritis, unspecified site: Secondary | ICD-10-CM | POA: Diagnosis not present

## 2017-10-20 DIAGNOSIS — E785 Hyperlipidemia, unspecified: Secondary | ICD-10-CM | POA: Insufficient documentation

## 2017-10-20 DIAGNOSIS — F17210 Nicotine dependence, cigarettes, uncomplicated: Secondary | ICD-10-CM | POA: Insufficient documentation

## 2017-10-20 DIAGNOSIS — K5090 Crohn's disease, unspecified, without complications: Secondary | ICD-10-CM | POA: Diagnosis not present

## 2017-10-20 DIAGNOSIS — Z7902 Long term (current) use of antithrombotics/antiplatelets: Secondary | ICD-10-CM | POA: Diagnosis not present

## 2017-10-20 DIAGNOSIS — Z8673 Personal history of transient ischemic attack (TIA), and cerebral infarction without residual deficits: Secondary | ICD-10-CM | POA: Diagnosis not present

## 2017-10-20 DIAGNOSIS — F329 Major depressive disorder, single episode, unspecified: Secondary | ICD-10-CM | POA: Diagnosis not present

## 2017-10-20 DIAGNOSIS — I272 Pulmonary hypertension, unspecified: Secondary | ICD-10-CM | POA: Diagnosis not present

## 2017-10-20 DIAGNOSIS — I110 Hypertensive heart disease with heart failure: Secondary | ICD-10-CM | POA: Diagnosis not present

## 2017-10-20 DIAGNOSIS — Z79899 Other long term (current) drug therapy: Secondary | ICD-10-CM | POA: Diagnosis not present

## 2017-10-20 DIAGNOSIS — I5032 Chronic diastolic (congestive) heart failure: Secondary | ICD-10-CM | POA: Insufficient documentation

## 2017-10-20 DIAGNOSIS — J8410 Pulmonary fibrosis, unspecified: Secondary | ICD-10-CM | POA: Diagnosis not present

## 2017-10-20 DIAGNOSIS — R531 Weakness: Secondary | ICD-10-CM | POA: Insufficient documentation

## 2017-10-20 DIAGNOSIS — Z7982 Long term (current) use of aspirin: Secondary | ICD-10-CM | POA: Diagnosis not present

## 2017-10-20 LAB — BASIC METABOLIC PANEL
Anion gap: 11 (ref 5–15)
BUN: 11 mg/dL (ref 6–20)
CALCIUM: 9 mg/dL (ref 8.9–10.3)
CHLORIDE: 104 mmol/L (ref 101–111)
CO2: 25 mmol/L (ref 22–32)
CREATININE: 0.71 mg/dL (ref 0.44–1.00)
GFR calc non Af Amer: >60 mL/min (ref >60)
Glucose, Bld: 103 mg/dL — ABNORMAL HIGH (ref 65–99)
Potassium: 3.7 mmol/L (ref 3.5–5.1)
SODIUM: 140 mmol/L (ref 135–145)

## 2017-10-20 MED ORDER — FUROSEMIDE 40 MG PO TABS: ORAL_TABLET | ORAL | 0 refills | Status: DC

## 2017-10-20 MED ORDER — VARENICLINE TARTRATE 0.5 MG X 11 & 1 MG X 42 PO MISC: ORAL | 0 refills | Status: DC

## 2017-10-20 MED ORDER — POTASSIUM CHLORIDE CRYS ER 20 MEQ PO TBCR: 40.0000 meq | EXTENDED_RELEASE_TABLET | Freq: Every day | ORAL | 3 refills | Status: DC

## 2017-10-20 NOTE — H&P (View-Only) (Signed)
PCP: Dr. Scotty Court Cardiology: Dr. Domenic Polite HF Cardiology: Dr. Aundra Dubin  77 y.o. with history of prior DVT, CAD, HTN was referred by Dr. Domenic Polite for evaluation of pulmonary hypertension/RV failure.  Patient is a long-time smoker, now < 1/2 ppd. In 1/19, she began to develop peripheral edema.  This gradually worsened.  She was given Lasix for 7 days with improvement, then edema returned and recently, Lasix was restarted at 40 mg daily.  She is now short of breath after walking 100 feet. Winded with housework.  She is short of breath with stairs and inclines.  She is also limited by leg pain/numbness/weakness.  Legs "feel like jelly."  She has difficulty with balance and has knee pain from OA.  She walks with a walker. We checked her oxygen saturation today, it dropped to 71% with ambulation and was 88% at rest.   ECG (personally reviewed, 12/18): NSR, old inferior MI, poor RWP  Labs (1/19): K 3.8, creatinine 0.74  PMH: 1. CAD: BMS to RCA in 2004.  - Cardiolite (12/16): EF 52%, fixed inferior defect with no ischemia.  2. Depression 3. H/o DVT 4. HTN 5. Osteoarthritis: h/o R TKR.  6. H/o melanoma 7. Crohns disease 8. Hyperlipidemia 9. Carotid stenosis: Followed at [facility].  - Carotid dopplers (11/18): RICA 75-88% stenosis, LICA 32-54% stenosis.  10. Active smoker 11. Pulmonary hypertension/RV failure: Echo (1/19) with EF 55-60%, mild LVH, mild MR, moderate RV dilation with moderately decreased systolic function, moderate TR, PASP 74 mmHg.  - CTA chest (1/19): No PE, chronic fibrotic changes in the lungs, 6 mm RUL nodule.  12. TIA  Social History   Socioeconomic History  . Marital status: Divorced    Spouse name: Not on file  . Number of children: 2  . Years of education: Not on file  . Highest education level: Not on file  Social Needs  . Financial resource strain: Not on file  . Food insecurity - worry: Not on file  . Food insecurity - inability: Not on file  . Transportation needs -  medical: Not on file  . Transportation needs - non-medical: Not on file  Occupational History  . Occupation: Retired    Fish farm manager: RETIRED  Tobacco Use  . Smoking status: Light Tobacco Smoker    Packs/day: 0.25    Years: 40.00    Pack years: 10.00    Types: Cigarettes    Start date: 06/02/1957  . Smokeless tobacco: Never Used  . Tobacco comment: trying to quit 1/2 pack day - so much stress in her lift  Substance and Sexual Activity  . Alcohol use: No    Alcohol/week: 0.0 oz  . Drug use: No  . Sexual activity: Not on file  Other Topics Concern  . Not on file  Social History Narrative  . Not on file   Family History  Problem Relation Age of Onset  . Diabetes type II Mother   . Heart attack Mother 13       questionable  . Uterine cancer Mother   . Heart disease Mother        before age 22  . Varicose Veins Mother   . Other Brother        PVD and hx DVT  . Deep vein thrombosis Brother   . Breast cancer Sister   . Hyperlipidemia Sister   . Hypertension Sister   . Colon cancer Neg Hx    ROS: All systems reviewed and negative except as per HPI.  Current  Outpatient Medications  Medication Sig Dispense Refill  . aspirin 81 MG tablet Take 81 mg by mouth daily.      . clopidogrel (PLAVIX) 75 MG tablet Take 1 tablet (75 mg total) by mouth daily. 30 tablet 6  . Cyanocobalamin (B-12 PO) Take 1 tablet by mouth See admin instructions. Take 1 capsule every day x 7 days every other week     . dicyclomine (BENTYL) 10 MG capsule Take 1 capsule (10 mg total) by mouth as needed for spasms. 30 capsule 3  . ferrous sulfate 325 (65 FE) MG tablet Take 325 mg by mouth daily.    Marland Kitchen Fexofenadine HCl (ALLEGRA ALLERGY PO) Take 1 tablet by mouth daily as needed (for allergies).     Marland Kitchen FLUoxetine (PROZAC) 40 MG capsule Take 40 mg by mouth daily.    . folic acid (FOLVITE) 1 MG tablet Take 1 tablet (1 mg total) by mouth daily. 90 tablet 4  . furosemide (LASIX) 40 MG tablet Take 1 tablet (40 mg total)  by mouth every morning AND 0.5 tablets (20 mg total) every evening. (Patient taking differently: Take 1 tablet (40 mg total) by mouth every morning AND 0.5 tablets (20 mg total) every evening for 4 days then continue 40 mg by mouth daily) 45 tablet 0  . HYDROcodone-acetaminophen (NORCO) 10-325 MG tablet Take 1 tablet by mouth every 6 (six) hours as needed for moderate pain.     . metoprolol succinate (TOPROL-XL) 25 MG 24 hr tablet Take 1 tablet (25 mg total) by mouth daily. 90 tablet 3  . Misc Natural Products (OSTEO BI-FLEX JOINT SHIELD PO) Take 1 capsule by mouth daily.    . nitroGLYCERIN (NITROSTAT) 0.4 MG SL tablet Place 1 tablet (0.4 mg total) under the tongue every 5 (five) minutes x 3 doses as needed. (Patient taking differently: Place 0.4 mg under the tongue every 5 (five) minutes x 3 doses as needed for chest pain. ) 25 tablet 3  . Omega-3 Fatty Acids (FISH OIL) 1200 MG CAPS Take 1,200 mg by mouth daily.     . rosuvastatin (CRESTOR) 10 MG tablet Take 10 mg by mouth daily.    Marland Kitchen sulfaSALAzine (AZULFIDINE) 500 MG tablet Take 1 tablet (500 mg total) by mouth 2 (two) times daily. 60 tablet 6  . potassium chloride SA (K-DUR,KLOR-CON) 20 MEQ tablet Take 2 tablets (40 mEq total) by mouth daily. 60 tablet 3  . varenicline (CHANTIX STARTING MONTH PAK) 0.5 MG X 11 & 1 MG X 42 tablet Take one 0.5 mg tablet by mouth once daily for 3 days, then increase to one 0.5 mg tablet twice daily for 4 days, then increase to one 1 mg tablet twice daily. 53 tablet 0   No current facility-administered medications for this encounter.    BP 138/78   Pulse 65   Wt 210 lb 12 oz (95.6 kg)   SpO2 (!) 88%   BMI 35.07 kg/m  General: NAD Neck: JVP 9-10 cm with HJR, no thyromegaly or thyroid nodule.  Lungs: Slight crackles at bases bilaterally.  CV: Nondisplaced PMI.  Heart regular S1/S2, no S3/S4, no murmur.  1+ edema to knees bilaterally.  Bilatera carotid bruits.  Unable to palpate pedal pulses.  Abdomen: Soft,  nontender, no hepatosplenomegaly, no distention.  Skin: Intact without lesions or rashes.  Neurologic: Alert and oriented x 3.  Psych: Normal affect. Extremities: No clubbing or cyanosis.  HEENT: Normal.   Assessment/Plan: 1. Chronic diastolic CHF with prominent RV failure:  Moderately dilated RV with moderately decreased systolic function on 4/36 echo.  On exam, she is volume overloaded.  She has NYHA class III symptoms.  - Increase Lasix to 40 mg bid x 4 days, then Lasix 40 qam/20 qpm after that.  Start KCl 40 daily.  - BMET today and again in 10 days.  - Wear compression stockings.  2. Pulmonary hypertension: PASP 74 mmHg on 1/19 echo with appearance of cor pulmonale.  CTA chest in 1/19 showed chronic fibrotic changes in the lungs but not emphysema.  ?Group 1 versus group 3 pulmonary hypertension.  - She will need RHC to assess filling pressures and PA pressure. We discussed risks/benefits and she agrees to proceed.  - I will arrange for PFTs given fibrotic changes on CT.  - If PAH is confirmed, she will need V/Q scan (has h/o DVT) and rheumatological serologic workup.  - Oxygen saturation is markedly low with ambulation => I will arrange for home oxygen for use with exertion.  3. Pulmonary fibrosis: Noted on CTA chest.   - Arrange PFTs as above.  - Home oxygen with hypoxemia.  - I will arrange an appointment with pulmonary.  4. CAD: h/o BMS to RCA in 2004.  Patient has had increasing exertional dyspnea.  I will do coronary angiography in addition to [place] to rule out worsening CAD.   - Continue ASA 81 and Plavix.  - Continue Crestor 10 mg daily.  5. Carotid stenosis: Followed by [facility], repeat carotid dopplers in 11/19 6. Active smoker: I gave her a Chantix prescription.  7. Leg weakness: I cannot palpate pedal pulses.  I will arrange for peripheral arterial doppler evaluation to assess for PAD.   Loralie Champagne 10/20/2017

## 2017-10-20 NOTE — Progress Notes (Signed)
SATURATION QUALIFICATIONS: (This note is used to comply with regulatory documentation for home oxygen)  Patient Saturations on Room Air at Rest = 88%  Patient Saturations on Room Air while Ambulating = 71%  Patient Saturations on 2 Liters of oxygen while Ambulating = 90%  Please briefly explain why patient needs home oxygen:

## 2017-10-20 NOTE — Progress Notes (Signed)
PCP: Dr. Tapper Cardiology: Dr. McDowell HF Cardiology: Dr. Kwamaine Cuppett  77 y.o. with history of prior DVT, CAD, HTN was referred by Dr. McDowell for evaluation of pulmonary hypertension/RV failure.  Patient is a long-time smoker, now < 1/2 ppd. In 1/19, she began to develop peripheral edema.  This gradually worsened.  She was given Lasix for 7 days with improvement, then edema returned and recently, Lasix was restarted at 40 mg daily.  She is now short of breath after walking 100 feet. Winded with housework.  She is short of breath with stairs and inclines.  She is also limited by leg pain/numbness/weakness.  Legs "feel like jelly."  She has difficulty with balance and has knee pain from OA.  She walks with a walker. We checked her oxygen saturation today, it dropped to 71% with ambulation and was 88% at rest.   ECG (personally reviewed, 12/18): NSR, old inferior MI, poor RWP  Labs (1/19): K 3.8, creatinine 0.74  PMH: 1. CAD: BMS to RCA in 2004.  - Cardiolite (12/16): EF 52%, fixed inferior defect with no ischemia.  2. Depression 3. H/o DVT 4. HTN 5. Osteoarthritis: h/o R TKR.  6. H/o melanoma 7. Crohns disease 8. Hyperlipidemia 9. Carotid stenosis: Followed at [facility].  - Carotid dopplers (11/18): RICA 60-79% stenosis, LICA 40-59% stenosis.  10. Active smoker 11. Pulmonary hypertension/RV failure: Echo (1/19) with EF 55-60%, mild LVH, mild MR, moderate RV dilation with moderately decreased systolic function, moderate TR, PASP 74 mmHg.  - CTA chest (1/19): No PE, chronic fibrotic changes in the lungs, 6 mm RUL nodule.  12. TIA  Social History   Socioeconomic History  . Marital status: Divorced    Spouse name: Not on file  . Number of children: 2  . Years of education: Not on file  . Highest education level: Not on file  Social Needs  . Financial resource strain: Not on file  . Food insecurity - worry: Not on file  . Food insecurity - inability: Not on file  . Transportation needs -  medical: Not on file  . Transportation needs - non-medical: Not on file  Occupational History  . Occupation: Retired    Employer: RETIRED  Tobacco Use  . Smoking status: Light Tobacco Smoker    Packs/day: 0.25    Years: 40.00    Pack years: 10.00    Types: Cigarettes    Start date: 06/02/1957  . Smokeless tobacco: Never Used  . Tobacco comment: trying to quit 1/2 pack day - so much stress in her lift  Substance and Sexual Activity  . Alcohol use: No    Alcohol/week: 0.0 oz  . Drug use: No  . Sexual activity: Not on file  Other Topics Concern  . Not on file  Social History Narrative  . Not on file   Family History  Problem Relation Age of Onset  . Diabetes type II Mother   . Heart attack Mother 64       questionable  . Uterine cancer Mother   . Heart disease Mother        before age 60  . Varicose Veins Mother   . Other Brother        PVD and hx DVT  . Deep vein thrombosis Brother   . Breast cancer Sister   . Hyperlipidemia Sister   . Hypertension Sister   . Colon cancer Neg Hx    ROS: All systems reviewed and negative except as per HPI.  Current   Outpatient Medications  Medication Sig Dispense Refill  . aspirin 81 MG tablet Take 81 mg by mouth daily.      . clopidogrel (PLAVIX) 75 MG tablet Take 1 tablet (75 mg total) by mouth daily. 30 tablet 6  . Cyanocobalamin (B-12 PO) Take 1 tablet by mouth See admin instructions. Take 1 capsule every day x 7 days every other week     . dicyclomine (BENTYL) 10 MG capsule Take 1 capsule (10 mg total) by mouth as needed for spasms. 30 capsule 3  . ferrous sulfate 325 (65 FE) MG tablet Take 325 mg by mouth daily.    . Fexofenadine HCl (ALLEGRA ALLERGY PO) Take 1 tablet by mouth daily as needed (for allergies).     . FLUoxetine (PROZAC) 40 MG capsule Take 40 mg by mouth daily.    . folic acid (FOLVITE) 1 MG tablet Take 1 tablet (1 mg total) by mouth daily. 90 tablet 4  . furosemide (LASIX) 40 MG tablet Take 1 tablet (40 mg total)  by mouth every morning AND 0.5 tablets (20 mg total) every evening. (Patient taking differently: Take 1 tablet (40 mg total) by mouth every morning AND 0.5 tablets (20 mg total) every evening for 4 days then continue 40 mg by mouth daily) 45 tablet 0  . HYDROcodone-acetaminophen (NORCO) 10-325 MG tablet Take 1 tablet by mouth every 6 (six) hours as needed for moderate pain.     . metoprolol succinate (TOPROL-XL) 25 MG 24 hr tablet Take 1 tablet (25 mg total) by mouth daily. 90 tablet 3  . Misc Natural Products (OSTEO BI-FLEX JOINT SHIELD PO) Take 1 capsule by mouth daily.    . nitroGLYCERIN (NITROSTAT) 0.4 MG SL tablet Place 1 tablet (0.4 mg total) under the tongue every 5 (five) minutes x 3 doses as needed. (Patient taking differently: Place 0.4 mg under the tongue every 5 (five) minutes x 3 doses as needed for chest pain. ) 25 tablet 3  . Omega-3 Fatty Acids (FISH OIL) 1200 MG CAPS Take 1,200 mg by mouth daily.     . rosuvastatin (CRESTOR) 10 MG tablet Take 10 mg by mouth daily.    . sulfaSALAzine (AZULFIDINE) 500 MG tablet Take 1 tablet (500 mg total) by mouth 2 (two) times daily. 60 tablet 6  . potassium chloride SA (K-DUR,KLOR-CON) 20 MEQ tablet Take 2 tablets (40 mEq total) by mouth daily. 60 tablet 3  . varenicline (CHANTIX STARTING MONTH PAK) 0.5 MG X 11 & 1 MG X 42 tablet Take one 0.5 mg tablet by mouth once daily for 3 days, then increase to one 0.5 mg tablet twice daily for 4 days, then increase to one 1 mg tablet twice daily. 53 tablet 0   No current facility-administered medications for this encounter.    BP 138/78   Pulse 65   Wt 210 lb 12 oz (95.6 kg)   SpO2 (!) 88%   BMI 35.07 kg/m  General: NAD Neck: JVP 9-10 cm with HJR, no thyromegaly or thyroid nodule.  Lungs: Slight crackles at bases bilaterally.  CV: Nondisplaced PMI.  Heart regular S1/S2, no S3/S4, no murmur.  1+ edema to knees bilaterally.  Bilatera carotid bruits.  Unable to palpate pedal pulses.  Abdomen: Soft,  nontender, no hepatosplenomegaly, no distention.  Skin: Intact without lesions or rashes.  Neurologic: Alert and oriented x 3.  Psych: Normal affect. Extremities: No clubbing or cyanosis.  HEENT: Normal.   Assessment/Plan: 1. Chronic diastolic CHF with prominent RV failure:   Moderately dilated RV with moderately decreased systolic function on 4/36 echo.  On exam, she is volume overloaded.  She has NYHA class III symptoms.  - Increase Lasix to 40 mg bid x 4 days, then Lasix 40 qam/20 qpm after that.  Start KCl 40 daily.  - BMET today and again in 10 days.  - Wear compression stockings.  2. Pulmonary hypertension: PASP 74 mmHg on 1/19 echo with appearance of cor pulmonale.  CTA chest in 1/19 showed chronic fibrotic changes in the lungs but not emphysema.  ?Group 1 versus group 3 pulmonary hypertension.  - She will need RHC to assess filling pressures and PA pressure. We discussed risks/benefits and she agrees to proceed.  - I will arrange for PFTs given fibrotic changes on CT.  - If PAH is confirmed, she will need V/Q scan (has h/o DVT) and rheumatological serologic workup.  - Oxygen saturation is markedly low with ambulation => I will arrange for home oxygen for use with exertion.  3. Pulmonary fibrosis: Noted on CTA chest.   - Arrange PFTs as above.  - Home oxygen with hypoxemia.  - I will arrange an appointment with pulmonary.  4. CAD: h/o BMS to RCA in 2004.  Patient has had increasing exertional dyspnea.  I will do coronary angiography in addition to [place] to rule out worsening CAD.   - Continue ASA 81 and Plavix.  - Continue Crestor 10 mg daily.  5. Carotid stenosis: Followed by [facility], repeat carotid dopplers in 11/19 6. Active smoker: I gave her a Chantix prescription.  7. Leg weakness: I cannot palpate pedal pulses.  I will arrange for peripheral arterial doppler evaluation to assess for PAD.   Shelley [name] 10/20/2017

## 2017-10-20 NOTE — Patient Instructions (Signed)
Start Potassium 40 meq (2 tabs) daily  Increase Furosemide 40 mg (1 tab), twice a day  -Then 40 mg (1 tab) in AM, then 20 mg (0.5 tab) in PM  Start Chantix as prescribed Rx given   Rx given for compression stockings wear in daytime  Labs drawn today (if we do not call you, then your lab work was stable)   Your physician has requested that you have a cardiac catheterization. Cardiac catheterization is used to diagnose and/or treat various heart conditions. Doctors may recommend this procedure for a number of different reasons. The most common reason is to evaluate chest pain. Chest pain can be a symptom of coronary artery disease (CAD), and cardiac catheterization can show whether plaque is narrowing or blocking your heart's arteries. This procedure is also used to evaluate the valves, as well as measure the blood flow and oxygen levels in different parts of your heart. For further information please visit [email or web address]. Please follow instruction sheet, as given.  Your physician has recommended that you have a pulmonary function test. Pulmonary Function Tests are a group of tests that measure how well air moves in and out of your lungs.  Your physician has requested that you have a lower or upper extremity arterial duplex. This test is an ultrasound of the arteries in the legs or arms. It looks at arterial blood flow in the legs and arms. Allow one hour for Lower and Upper Arterial scans. There are no restrictions or special instructions  [facility] will call you about oxygen  You have been referred to Dr. Lake Bells pulmonary  Your physician recommends that you return for lab work in: 10 days   Your physician recommends that you schedule a follow-up appointment in: 2 weeks post catheterization with Dr. Aundra Dubin

## 2017-10-23 ENCOUNTER — Other Ambulatory Visit (HOSPITAL_COMMUNITY): Payer: Self-pay | Admitting: Cardiology

## 2017-10-23 DIAGNOSIS — I5032 Chronic diastolic (congestive) heart failure: Secondary | ICD-10-CM

## 2017-10-27 ENCOUNTER — Telehealth (HOSPITAL_COMMUNITY): Payer: Self-pay | Admitting: *Deleted

## 2017-10-27 NOTE — Telephone Encounter (Signed)
No pre cert reqd [id number].

## 2017-10-29 ENCOUNTER — Ambulatory Visit (HOSPITAL_COMMUNITY)
Admission: RE | Admit: 2017-10-29 | Discharge: 2017-10-29 | Disposition: A | Discharge status: Home / Self Care | Payer: PPO | Source: Ambulatory Visit | Attending: Cardiology | Admitting: Cardiology

## 2017-10-29 ENCOUNTER — Ambulatory Visit (HOSPITAL_COMMUNITY)
Admission: RE | Admit: 2017-10-29 | Discharge: 2017-10-29 | Disposition: A | Discharge status: Home / Self Care | Payer: PPO | Source: Ambulatory Visit | Attending: Internal Medicine | Admitting: Internal Medicine

## 2017-10-29 DIAGNOSIS — F17210 Nicotine dependence, cigarettes, uncomplicated: Secondary | ICD-10-CM | POA: Diagnosis not present

## 2017-10-29 DIAGNOSIS — I5032 Chronic diastolic (congestive) heart failure: Secondary | ICD-10-CM

## 2017-10-29 DIAGNOSIS — J449 Chronic obstructive pulmonary disease, unspecified: Secondary | ICD-10-CM | POA: Insufficient documentation

## 2017-10-29 LAB — BASIC METABOLIC PANEL
ANION GAP: 14 (ref 5–15)
BUN: 25 mg/dL — ABNORMAL HIGH (ref 6–20)
CHLORIDE: 100 mmol/L — AB (ref 101–111)
CO2: 23 mmol/L (ref 22–32)
Calcium: 9.9 mg/dL (ref 8.9–10.3)
Creatinine, Ser: 0.93 mg/dL (ref 0.44–1.00)
GFR, EST NON AFRICAN AMERICAN: 57 mL/min — AB (ref >60)
Glucose, Bld: 112 mg/dL — ABNORMAL HIGH (ref 65–99)
POTASSIUM: 5.2 mmol/L — AB (ref 3.5–5.1)
SODIUM: 137 mmol/L (ref 135–145)

## 2017-10-29 LAB — PULMONARY FUNCTION TEST
DL/VA % pred: 50 %
DL/VA: 2.47 ml/min/mmHg/L
DLCO UNC % PRED: 31 %
DLCO unc: 8.1 ml/min/mmHg
FEF 25-75 POST: 1.1 L/s
FEF 25-75 Pre: 0.83 L/sec
FEF2575-%Change-Post: 31 %
FEF2575-%PRED-PRE: 53 %
FEF2575-%Pred-Post: 70 %
FEV1-%Change-Post: 11 %
FEV1-%PRED-POST: 61 %
FEV1-%Pred-Pre: 54 %
FEV1-PRE: 1.15 L
FEV1-Post: 1.28 L
FEV1FVC-%CHANGE-POST: 2 %
FEV1FVC-%Pred-Pre: 83 %
FEV6-%CHANGE-POST: 8 %
FEV6-%Pred-Post: 73 %
FEV6-%Pred-Pre: 67 %
FEV6-Post: 1.95 L
FEV6-Pre: 1.8 L
FEV6FVC-%PRED-PRE: 105 %
FEV6FVC-%Pred-Post: 105 %
FVC-%Change-Post: 8 %
FVC-%Pred-Post: 71 %
FVC-%Pred-Pre: 66 %
FVC-Post: 2.01 L
FVC-Pre: 1.86 L
POST FEV1/FVC RATIO: 63 %
POST FEV6/FVC RATIO: 100 %
PRE FEV1/FVC RATIO: 62 %
Pre FEV6/FVC Ratio: 100 %
RV % pred: 91 %
RV: 2.21 L
TLC % PRED: 80 %
TLC: 4.19 L

## 2017-10-29 MED ORDER — ALBUTEROL SULFATE (2.5 MG/3ML) 0.083% IN NEBU
2.5000 mg | INHALATION_SOLUTION | Freq: Once | RESPIRATORY_TRACT | Status: AC
  Administered 2017-10-29: 2.5 mg via RESPIRATORY_TRACT

## 2017-10-30 ENCOUNTER — Ambulatory Visit (HOSPITAL_COMMUNITY)
Admission: RE | Admit: 2017-10-30 | Discharge: 2017-10-30 | Disposition: A | Discharge status: Home / Self Care | Payer: PPO | Source: Ambulatory Visit | Attending: Cardiology | Admitting: Cardiology

## 2017-10-30 ENCOUNTER — Other Ambulatory Visit (HOSPITAL_COMMUNITY): Payer: Self-pay

## 2017-10-30 ENCOUNTER — Ambulatory Visit (HOSPITAL_COMMUNITY)
Admission: RE | Disposition: A | Discharge status: Home / Self Care | Payer: Self-pay | Source: Ambulatory Visit | Attending: Cardiology

## 2017-10-30 ENCOUNTER — Encounter (HOSPITAL_COMMUNITY): Payer: Self-pay | Admitting: Cardiology

## 2017-10-30 DIAGNOSIS — M1990 Unspecified osteoarthritis, unspecified site: Secondary | ICD-10-CM | POA: Insufficient documentation

## 2017-10-30 DIAGNOSIS — Z7982 Long term (current) use of aspirin: Secondary | ICD-10-CM | POA: Insufficient documentation

## 2017-10-30 DIAGNOSIS — I5032 Chronic diastolic (congestive) heart failure: Secondary | ICD-10-CM | POA: Diagnosis not present

## 2017-10-30 DIAGNOSIS — I110 Hypertensive heart disease with heart failure: Secondary | ICD-10-CM | POA: Diagnosis not present

## 2017-10-30 DIAGNOSIS — F17210 Nicotine dependence, cigarettes, uncomplicated: Secondary | ICD-10-CM | POA: Insufficient documentation

## 2017-10-30 DIAGNOSIS — I2510 Atherosclerotic heart disease of native coronary artery without angina pectoris: Secondary | ICD-10-CM | POA: Diagnosis not present

## 2017-10-30 DIAGNOSIS — Z86718 Personal history of other venous thrombosis and embolism: Secondary | ICD-10-CM | POA: Diagnosis not present

## 2017-10-30 DIAGNOSIS — Z9981 Dependence on supplemental oxygen: Secondary | ICD-10-CM | POA: Diagnosis not present

## 2017-10-30 DIAGNOSIS — Z79899 Other long term (current) drug therapy: Secondary | ICD-10-CM | POA: Insufficient documentation

## 2017-10-30 DIAGNOSIS — I2782 Chronic pulmonary embolism: Secondary | ICD-10-CM

## 2017-10-30 DIAGNOSIS — J8410 Pulmonary fibrosis, unspecified: Secondary | ICD-10-CM | POA: Diagnosis not present

## 2017-10-30 DIAGNOSIS — Z85820 Personal history of malignant melanoma of skin: Secondary | ICD-10-CM | POA: Insufficient documentation

## 2017-10-30 DIAGNOSIS — I272 Pulmonary hypertension, unspecified: Secondary | ICD-10-CM | POA: Diagnosis not present

## 2017-10-30 DIAGNOSIS — F329 Major depressive disorder, single episode, unspecified: Secondary | ICD-10-CM | POA: Insufficient documentation

## 2017-10-30 DIAGNOSIS — I2721 Secondary pulmonary arterial hypertension: Secondary | ICD-10-CM | POA: Insufficient documentation

## 2017-10-30 DIAGNOSIS — E785 Hyperlipidemia, unspecified: Secondary | ICD-10-CM | POA: Diagnosis not present

## 2017-10-30 DIAGNOSIS — Z8673 Personal history of transient ischemic attack (TIA), and cerebral infarction without residual deficits: Secondary | ICD-10-CM | POA: Diagnosis not present

## 2017-10-30 DIAGNOSIS — K5090 Crohn's disease, unspecified, without complications: Secondary | ICD-10-CM | POA: Diagnosis not present

## 2017-10-30 DIAGNOSIS — R0902 Hypoxemia: Secondary | ICD-10-CM | POA: Insufficient documentation

## 2017-10-30 DIAGNOSIS — Z7902 Long term (current) use of antithrombotics/antiplatelets: Secondary | ICD-10-CM | POA: Insufficient documentation

## 2017-10-30 HISTORY — PX: RIGHT/LEFT HEART CATH AND CORONARY ANGIOGRAPHY: CATH118266

## 2017-10-30 LAB — POCT I-STAT 3, VENOUS BLOOD GAS (G3P V)
Acid-base deficit: 1 mmol/L (ref 0.0–2.0)
Acid-base deficit: 4 mmol/L — ABNORMAL HIGH (ref 0.0–2.0)
BICARBONATE: 22.5 mmol/L (ref 20.0–28.0)
BICARBONATE: 26.9 mmol/L (ref 20.0–28.0)
O2 SAT: 70 %
O2 Saturation: 71 %
PH VEN: 7.283 (ref 7.250–7.430)
PO2 VEN: 41 mmHg (ref 32.0–45.0)
TCO2: 24 mmol/L (ref 22–32)
TCO2: 29 mmol/L (ref 22–32)
pCO2, Ven: 47.6 mmHg (ref 44.0–60.0)
pCO2, Ven: 53.8 mmHg (ref 44.0–60.0)
pH, Ven: 7.307 (ref 7.250–7.430)
pO2, Ven: 42 mmHg (ref 32.0–45.0)

## 2017-10-30 LAB — BASIC METABOLIC PANEL
ANION GAP: 12 (ref 5–15)
BUN: 20 mg/dL (ref 6–20)
CALCIUM: 9.6 mg/dL (ref 8.9–10.3)
CHLORIDE: 102 mmol/L (ref 101–111)
CO2: 24 mmol/L (ref 22–32)
Creatinine, Ser: 0.87 mg/dL (ref 0.44–1.00)
GFR calc non Af Amer: >60 mL/min (ref >60)
Glucose, Bld: 102 mg/dL — ABNORMAL HIGH (ref 65–99)
Potassium: 4.6 mmol/L (ref 3.5–5.1)
SODIUM: 138 mmol/L (ref 135–145)

## 2017-10-30 LAB — CBC
HEMATOCRIT: 47 % — AB (ref 36.0–46.0)
HEMOGLOBIN: 15.3 g/dL — AB (ref 12.0–15.0)
MCH: 32.1 pg (ref 26.0–34.0)
MCHC: 32.6 g/dL (ref 30.0–36.0)
MCV: 98.7 fL (ref 78.0–100.0)
Platelets: 206 10*3/uL (ref 150–400)
RBC: 4.76 MIL/uL (ref 3.87–5.11)
RDW: 14.1 % (ref 11.5–15.5)
WBC: 5.9 10*3/uL (ref 4.0–10.5)

## 2017-10-30 LAB — PROTIME-INR
INR: 0.99
PROTHROMBIN TIME: 13 s (ref 11.4–15.2)

## 2017-10-30 SURGERY — RIGHT/LEFT HEART CATH AND CORONARY ANGIOGRAPHY
Anesthesia: LOCAL

## 2017-10-30 MED ORDER — IOPAMIDOL (ISOVUE-370) INJECTION 76%
INTRAVENOUS | Status: AC
  Filled 2017-10-30: qty 100

## 2017-10-30 MED ORDER — VERAPAMIL HCL 2.5 MG/ML IV SOLN
INTRAVENOUS | Status: DC | PRN
  Administered 2017-10-30: 10 mL via INTRA_ARTERIAL

## 2017-10-30 MED ORDER — SODIUM CHLORIDE 0.9% FLUSH: 3.0000 mL | Freq: Two times a day (BID) | INTRAVENOUS | Status: DC

## 2017-10-30 MED ORDER — FENTANYL CITRATE (PF) 100 MCG/2ML IJ SOLN
INTRAMUSCULAR | Status: AC
  Filled 2017-10-30: qty 2

## 2017-10-30 MED ORDER — MIDAZOLAM HCL 2 MG/2ML IJ SOLN
INTRAMUSCULAR | Status: AC
  Filled 2017-10-30: qty 2

## 2017-10-30 MED ORDER — HEPARIN (PORCINE) IN NACL 2-0.9 UNIT/ML-% IJ SOLN
INTRAMUSCULAR | Status: AC
  Filled 2017-10-30: qty 1000

## 2017-10-30 MED ORDER — ASPIRIN 81 MG PO CHEW: 81.0000 mg | CHEWABLE_TABLET | ORAL | Status: DC

## 2017-10-30 MED ORDER — SODIUM CHLORIDE 0.9 % IV SOLN
INTRAVENOUS | Status: DC
  Administered 2017-10-30: 08:00:00 via INTRAVENOUS

## 2017-10-30 MED ORDER — MIDAZOLAM HCL 2 MG/2ML IJ SOLN
INTRAMUSCULAR | Status: DC | PRN
  Administered 2017-10-30: 1 mg via INTRAVENOUS

## 2017-10-30 MED ORDER — ACETAMINOPHEN 325 MG PO TABS: 650.0000 mg | ORAL_TABLET | ORAL | Status: DC | PRN

## 2017-10-30 MED ORDER — VERAPAMIL HCL 2.5 MG/ML IV SOLN
INTRAVENOUS | Status: AC
  Filled 2017-10-30: qty 2

## 2017-10-30 MED ORDER — LIDOCAINE HCL (PF) 1 % IJ SOLN
INTRAMUSCULAR | Status: DC | PRN
  Administered 2017-10-30 (×2): 2 mL via SUBCUTANEOUS

## 2017-10-30 MED ORDER — FENTANYL CITRATE (PF) 100 MCG/2ML IJ SOLN
INTRAMUSCULAR | Status: DC | PRN
  Administered 2017-10-30 (×2): 25 ug via INTRAVENOUS

## 2017-10-30 MED ORDER — SODIUM CHLORIDE 0.9% FLUSH: 3.0000 mL | INTRAVENOUS | Status: DC | PRN

## 2017-10-30 MED ORDER — SODIUM CHLORIDE 0.9 % WEIGHT BASED INFUSION: 1.0000 mL/kg/h | INTRAVENOUS | Status: DC

## 2017-10-30 MED ORDER — LIDOCAINE HCL 1 % IJ SOLN
INTRAMUSCULAR | Status: AC
  Filled 2017-10-30: qty 20

## 2017-10-30 MED ORDER — SODIUM CHLORIDE 0.9 % IV SOLN: 250.0000 mL | INTRAVENOUS | Status: DC | PRN

## 2017-10-30 MED ORDER — IOPAMIDOL (ISOVUE-370) INJECTION 76%
INTRAVENOUS | Status: DC | PRN
  Administered 2017-10-30: 60 mL via INTRA_ARTERIAL

## 2017-10-30 MED ORDER — HEPARIN (PORCINE) IN NACL 2-0.9 UNIT/ML-% IJ SOLN
INTRAMUSCULAR | Status: AC | PRN
  Administered 2017-10-30 (×2): 500 mL

## 2017-10-30 MED ORDER — HEPARIN SODIUM (PORCINE) 1000 UNIT/ML IJ SOLN
INTRAMUSCULAR | Status: AC
  Filled 2017-10-30: qty 1

## 2017-10-30 MED ORDER — ONDANSETRON HCL 4 MG/2ML IJ SOLN: 4.0000 mg | Freq: Four times a day (QID) | INTRAMUSCULAR | Status: DC | PRN

## 2017-10-30 MED ORDER — HEPARIN SODIUM (PORCINE) 1000 UNIT/ML IJ SOLN
INTRAMUSCULAR | Status: DC | PRN
  Administered 2017-10-30: 4500 [IU] via INTRAVENOUS

## 2017-10-30 SURGICAL SUPPLY — 12 items
CATH BALLN WEDGE 5F 110CM (CATHETERS) ×1 IMPLANT
CATH IMPULSE 5F ANG/FL3.5 (CATHETERS) ×1 IMPLANT
DEVICE RAD COMP TR BAND LRG (VASCULAR PRODUCTS) ×1 IMPLANT
GLIDESHEATH SLEND SS 6F .021 (SHEATH) ×1 IMPLANT
GUIDEWIRE INQWIRE 1.5J.035X260 (WIRE) IMPLANT
INQWIRE 1.5J .035X260CM (WIRE) ×2
KIT HEART LEFT (KITS) ×2 IMPLANT
PACK CARDIAC CATHETERIZATION (CUSTOM PROCEDURE TRAY) ×2 IMPLANT
SHEATH GLIDE SLENDER 4/5FR (SHEATH) ×1 IMPLANT
TRANSDUCER W/STOPCOCK (MISCELLANEOUS) ×2 IMPLANT
TUBING CIL FLEX 10 FLL-RA (TUBING) ×2 IMPLANT
WIRE HI TORQ VERSACORE-J 145CM (WIRE) ×1 IMPLANT

## 2017-10-30 NOTE — Research (Signed)
OPTIMIZE Informed Consent   Subject Name: Shelley [name]  Subject met inclusion and exclusion criteria.  The informed consent form, study requirements and expectations were reviewed with the subject and questions and concerns were addressed prior to the signing of the consent form.  The subject verbalized understanding of the trail requirements.  The subject agreed to participate in the OPTIMIZE trial and signed the informed consent.  The informed consent was obtained prior to performance of any protocol-specific procedures for the subject.  A copy of the signed informed consent was given to the subject and a copy was placed in the subject's medical record. Applicable only if randomized.   Hedrick,Tammy W 10/30/2017, 8:33 AM

## 2017-10-30 NOTE — Discharge Instructions (Signed)

## 2017-10-30 NOTE — Interval H&P Note (Signed)
History and Physical Interval Note:  10/30/2017 9:33 AM  [name]  has presented today for surgery, with the diagnosis of hf  The various methods of treatment have been discussed with the patient and family. After consideration of risks, benefits and other options for treatment, the patient has consented to  Procedure(s): RIGHT/LEFT HEART CATH AND CORONARY ANGIOGRAPHY (N/A) as a surgical intervention .  The patient's history has been reviewed, patient examined, no change in status, stable for surgery.  I have reviewed the patient's chart and labs.  Questions were answered to the patient's satisfaction.     Deyton Ellenbecker [employer]

## 2017-10-31 ENCOUNTER — Telehealth (HOSPITAL_COMMUNITY): Payer: Self-pay | Admitting: Vascular Surgery

## 2017-10-31 ENCOUNTER — Ambulatory Visit (HOSPITAL_COMMUNITY): Payer: PPO

## 2017-10-31 MED FILL — Lidocaine HCl Local Inj 1%: INTRAMUSCULAR | Qty: 20 | Status: AC

## 2017-10-31 MED FILL — Heparin Sodium (Porcine) 2 Unit/ML in Sodium Chloride 0.9%: INTRAMUSCULAR | Qty: 1000 | Status: AC

## 2017-10-31 NOTE — Telephone Encounter (Signed)
Left pt message giving VQ sacan 11/05/16 @ 10, asked pt to call back to confirm app

## 2017-11-01 DIAGNOSIS — R2689 Other abnormalities of gait and mobility: Secondary | ICD-10-CM | POA: Diagnosis not present

## 2017-11-01 DIAGNOSIS — R0602 Shortness of breath: Secondary | ICD-10-CM | POA: Diagnosis not present

## 2017-11-01 DIAGNOSIS — I5032 Chronic diastolic (congestive) heart failure: Secondary | ICD-10-CM | POA: Diagnosis not present

## 2017-11-03 ENCOUNTER — Telehealth (HOSPITAL_COMMUNITY): Payer: Self-pay | Admitting: *Deleted

## 2017-11-03 NOTE — Telephone Encounter (Signed)
Patient left a VM on the triage line requesting a call back about her procedure/test scheduled for 11/05/17. Patient requests call back from RN.   Message routed to [place] to call patient.

## 2017-11-05 ENCOUNTER — Ambulatory Visit (HOSPITAL_COMMUNITY)
Admission: RE | Admit: 2017-11-05 | Discharge: 2017-11-05 | Disposition: A | Discharge status: Home / Self Care | Payer: PPO | Source: Ambulatory Visit | Attending: Cardiology | Admitting: Cardiology

## 2017-11-05 DIAGNOSIS — I517 Cardiomegaly: Secondary | ICD-10-CM | POA: Diagnosis not present

## 2017-11-05 DIAGNOSIS — R635 Abnormal weight gain: Secondary | ICD-10-CM | POA: Diagnosis not present

## 2017-11-05 DIAGNOSIS — I2782 Chronic pulmonary embolism: Secondary | ICD-10-CM | POA: Insufficient documentation

## 2017-11-05 DIAGNOSIS — I700 Atherosclerosis of aorta: Secondary | ICD-10-CM | POA: Insufficient documentation

## 2017-11-05 MED ORDER — TECHNETIUM TO 99M ALBUMIN AGGREGATED
4.3500 | Freq: Once | INTRAVENOUS | Status: AC | PRN
  Administered 2017-11-05: 4.35 via INTRAVENOUS

## 2017-11-05 MED ORDER — TECHNETIUM TC 99M DIETHYLENETRIAME-PENTAACETIC ACID
32.5000 | Freq: Once | INTRAVENOUS | Status: AC | PRN
  Administered 2017-11-05: 32.5 via RESPIRATORY_TRACT

## 2017-11-06 ENCOUNTER — Ambulatory Visit (HOSPITAL_COMMUNITY)
Admission: RE | Admit: 2017-11-06 | Discharge: 2017-11-06 | Disposition: A | Discharge status: Home / Self Care | Payer: PPO | Source: Ambulatory Visit | Attending: Cardiovascular Disease | Admitting: Cardiovascular Disease

## 2017-11-06 ENCOUNTER — Other Ambulatory Visit (HOSPITAL_COMMUNITY): Payer: Self-pay | Admitting: Cardiology

## 2017-11-06 DIAGNOSIS — I739 Peripheral vascular disease, unspecified: Secondary | ICD-10-CM | POA: Diagnosis not present

## 2017-11-07 ENCOUNTER — Telehealth (HOSPITAL_COMMUNITY): Payer: Self-pay

## 2017-11-07 DIAGNOSIS — I739 Peripheral vascular disease, unspecified: Secondary | ICD-10-CM

## 2017-11-07 NOTE — Telephone Encounter (Signed)
Notes recorded by Shirley Muscat, RN on 11/07/2017 at 9:32 AM EDT Pt aware of results and will put in referral ------  Notes recorded by Larey Dresser, MD on 11/06/2017 at 10:43 PM EDT Left SFA occlusion. Arrange for PV evaluation please.

## 2017-11-19 ENCOUNTER — Ambulatory Visit (HOSPITAL_COMMUNITY)
Admission: RE | Admit: 2017-11-19 | Discharge: 2017-11-19 | Disposition: A | Discharge status: Home / Self Care | Payer: PPO | Source: Ambulatory Visit | Attending: Cardiology | Admitting: Cardiology

## 2017-11-19 ENCOUNTER — Encounter (HOSPITAL_COMMUNITY): Payer: Self-pay | Admitting: Cardiology

## 2017-11-19 VITALS — BP 121/53 | HR 57 | Wt 199.0 lb

## 2017-11-19 DIAGNOSIS — I6529 Occlusion and stenosis of unspecified carotid artery: Secondary | ICD-10-CM | POA: Insufficient documentation

## 2017-11-19 DIAGNOSIS — I2510 Atherosclerotic heart disease of native coronary artery without angina pectoris: Secondary | ICD-10-CM | POA: Insufficient documentation

## 2017-11-19 DIAGNOSIS — I5032 Chronic diastolic (congestive) heart failure: Secondary | ICD-10-CM | POA: Diagnosis not present

## 2017-11-19 DIAGNOSIS — K5090 Crohn's disease, unspecified, without complications: Secondary | ICD-10-CM | POA: Insufficient documentation

## 2017-11-19 DIAGNOSIS — E782 Mixed hyperlipidemia: Secondary | ICD-10-CM | POA: Diagnosis not present

## 2017-11-19 DIAGNOSIS — Z79899 Other long term (current) drug therapy: Secondary | ICD-10-CM | POA: Insufficient documentation

## 2017-11-19 DIAGNOSIS — I272 Pulmonary hypertension, unspecified: Secondary | ICD-10-CM | POA: Diagnosis not present

## 2017-11-19 DIAGNOSIS — I110 Hypertensive heart disease with heart failure: Secondary | ICD-10-CM | POA: Diagnosis not present

## 2017-11-19 DIAGNOSIS — F17210 Nicotine dependence, cigarettes, uncomplicated: Secondary | ICD-10-CM | POA: Diagnosis not present

## 2017-11-19 DIAGNOSIS — Z7902 Long term (current) use of antithrombotics/antiplatelets: Secondary | ICD-10-CM | POA: Diagnosis not present

## 2017-11-19 DIAGNOSIS — Z833 Family history of diabetes mellitus: Secondary | ICD-10-CM | POA: Diagnosis not present

## 2017-11-19 DIAGNOSIS — I739 Peripheral vascular disease, unspecified: Secondary | ICD-10-CM | POA: Diagnosis not present

## 2017-11-19 DIAGNOSIS — Z803 Family history of malignant neoplasm of breast: Secondary | ICD-10-CM | POA: Diagnosis not present

## 2017-11-19 DIAGNOSIS — M1990 Unspecified osteoarthritis, unspecified site: Secondary | ICD-10-CM | POA: Insufficient documentation

## 2017-11-19 DIAGNOSIS — E785 Hyperlipidemia, unspecified: Secondary | ICD-10-CM | POA: Diagnosis not present

## 2017-11-19 DIAGNOSIS — F329 Major depressive disorder, single episode, unspecified: Secondary | ICD-10-CM | POA: Insufficient documentation

## 2017-11-19 DIAGNOSIS — Z8249 Family history of ischemic heart disease and other diseases of the circulatory system: Secondary | ICD-10-CM | POA: Insufficient documentation

## 2017-11-19 DIAGNOSIS — I2721 Secondary pulmonary arterial hypertension: Secondary | ICD-10-CM | POA: Diagnosis not present

## 2017-11-19 DIAGNOSIS — Z8673 Personal history of transient ischemic attack (TIA), and cerebral infarction without residual deficits: Secondary | ICD-10-CM | POA: Insufficient documentation

## 2017-11-19 DIAGNOSIS — Z808 Family history of malignant neoplasm of other organs or systems: Secondary | ICD-10-CM | POA: Insufficient documentation

## 2017-11-19 DIAGNOSIS — Z7982 Long term (current) use of aspirin: Secondary | ICD-10-CM | POA: Insufficient documentation

## 2017-11-19 DIAGNOSIS — Z85820 Personal history of malignant melanoma of skin: Secondary | ICD-10-CM | POA: Diagnosis not present

## 2017-11-19 DIAGNOSIS — Z86718 Personal history of other venous thrombosis and embolism: Secondary | ICD-10-CM | POA: Insufficient documentation

## 2017-11-19 LAB — BASIC METABOLIC PANEL
Anion gap: 9 (ref 5–15)
BUN: 12 mg/dL (ref 6–20)
CHLORIDE: 106 mmol/L (ref 101–111)
CO2: 23 mmol/L (ref 22–32)
CREATININE: 0.71 mg/dL (ref 0.44–1.00)
Calcium: 9.5 mg/dL (ref 8.9–10.3)
Glucose, Bld: 92 mg/dL (ref 65–99)
POTASSIUM: 4 mmol/L (ref 3.5–5.1)
SODIUM: 138 mmol/L (ref 135–145)

## 2017-11-19 LAB — LIPID PANEL
CHOL/HDL RATIO: 2.6 ratio
CHOLESTEROL: 172 mg/dL (ref 0–200)
HDL: 65 mg/dL (ref >40)
LDL Cholesterol: 92 mg/dL (ref 0–99)
TRIGLYCERIDES: 75 mg/dL (ref <150)
VLDL: 15 mg/dL (ref 0–40)

## 2017-11-19 MED ORDER — VARENICLINE TARTRATE 1 MG PO TABS: 1.0000 mg | ORAL_TABLET | Freq: Two times a day (BID) | ORAL | 1 refills | Status: DC

## 2017-11-19 NOTE — Patient Instructions (Signed)
Continue Chantix   Labs drawn today (if we do not call you, then your lab work was stable)   Your physician recommends that you schedule a follow-up appointment in: 6 month (August, 2019) with Dr. Aundra Dubin  Please Call an Schedule Appointment

## 2017-11-19 NOTE — Progress Notes (Signed)
PCP: Dr. Scotty Court Cardiology: Dr. Domenic Polite HF Cardiology: Dr. Aundra Dubin  77 y.o. with history of prior DVT, CAD, HTN was referred by Dr. Domenic Polite for evaluation of pulmonary hypertension/RV failure.  Patient is a long-time smoker, now < 1/2 ppd. In 1/19, she began to develop peripheral edema.  This gradually worsened.  She was given Lasix for 7 days with improvement, then edema returned and recently, Lasix was restarted at 40 mg daily.    In 3/19, I took her for right/left heart cath.  This showed 70% in-stent restenosis in the RCA, medically managed.  She had normal filling pressures on RHC with moderate pulmonary hypertension.  V/Q scan showed no chronic PE and PFTs showed moderate to severe obstructive defect.   She returns for followup of CHF and pulmonary hypertension.  Since starting Chantix, she is down to 1 cigarette/day.  She feels like she has been doing better recently.  Weight is down 11 lbs.  She is still short of breath with stairs or walking longer distances on flat ground.  Leg "weakness" limits her more than breathing at this point.  She was found to have significant PAD, especially on left, on 2/19 peripheral arterial dopplers.  No pedal ulcers.  She has oxygen at home but has not been using it.  She is supposed to use oxygen with exertion.   Labs (1/19): K 3.8, creatinine 0.74 Labs (3/19): K 4.6, creatinine 0.87  PMH: 1. CAD: BMS to RCA in 2004.  - Cardiolite (12/16): EF 52%, fixed inferior defect with no ischemia.  - LHC (3/19): 70% in-stent restenosis in RCA stent, managed medically.  2. Depression 3. H/o DVT 4. HTN 5. Osteoarthritis: h/o R TKR.  6. H/o melanoma 7. Crohns disease 8. Hyperlipidemia 9. Carotid stenosis: Followed at [facility].  - Carotid dopplers (11/18): RICA 09-62% stenosis, LICA 83-66% stenosis.  10. Active smoker 11. Pulmonary hypertension/RV failure: Suspect primarily group 3.  Echo (1/19) with EF 55-60%, mild LVH, mild MR, moderate RV dilation with moderately  decreased systolic function, moderate TR, PASP 74 mmHg.  - CTA chest (1/19): No PE, chronic fibrotic changes in the lungs, 6 mm RUL nodule.  - RHC (3/19): mean RA 2, PA 54/16 mean 28, mean PCWP 8, CI 2.55, PVR 3.9 WU.  - PFTs (3/19): moderate-severe obstruction consistent with COPD.  - V/Q scan (3/19): No evidence for chronic PE.  12. TIA  13. PAD: Peripheral arterial dopplers (3/19) with totally occluded right AT, totally occluded left SFA, severe stenosis in left PT.   Social History   Socioeconomic History  . Marital status: Divorced    Spouse name: Not on file  . Number of children: 2  . Years of education: Not on file  . Highest education level: Not on file  Occupational History  . Occupation: Retired    Fish farm manager: RETIRED  Social Needs  . Financial resource strain: Not on file  . Food insecurity:    Worry: Not on file    Inability: Not on file  . Transportation needs:    Medical: Not on file    Non-medical: Not on file  Tobacco Use  . Smoking status: Light Tobacco Smoker    Packs/day: 0.25    Years: 40.00    Pack years: 10.00    Types: Cigarettes    Start date: 06/02/1957  . Smokeless tobacco: Never Used  . Tobacco comment: trying to quit 1/2 pack day - so much stress in her lift  Substance and Sexual Activity  .  Alcohol use: No    Alcohol/week: 0.0 oz  . Drug use: No  . Sexual activity: Not on file  Lifestyle  . Physical activity:    Days per week: Not on file    Minutes per session: Not on file  . Stress: Not on file  Relationships  . Social connections:    Talks on phone: Not on file    Gets together: Not on file    Attends religious service: Not on file    Active member of club or organization: Not on file    Attends meetings of clubs or organizations: Not on file    Relationship status: Not on file  . Intimate partner violence:    Fear of current or ex partner: Not on file    Emotionally abused: Not on file    Physically abused: Not on file    Forced  sexual activity: Not on file  Other Topics Concern  . Not on file  Social History Narrative  . Not on file   Family History  Problem Relation Age of Onset  . Diabetes type II Mother   . Heart attack Mother 25       questionable  . Uterine cancer Mother   . Heart disease Mother        before age 79  . Varicose Veins Mother   . Other Brother        PVD and hx DVT  . Deep vein thrombosis Brother   . Breast cancer Sister   . Hyperlipidemia Sister   . Hypertension Sister   . Colon cancer Neg Hx    ROS: All systems reviewed and negative except as per HPI.  Current Outpatient Medications  Medication Sig Dispense Refill  . aspirin 81 MG tablet Take 81 mg by mouth daily.      . clopidogrel (PLAVIX) 75 MG tablet Take 1 tablet (75 mg total) by mouth daily. 30 tablet 6  . Cyanocobalamin (B-12 PO) Take 1 tablet by mouth See admin instructions. Take 1 capsule every day x 7 days every other week     . dicyclomine (BENTYL) 10 MG capsule Take 1 capsule (10 mg total) by mouth as needed for spasms. 30 capsule 3  . ferrous sulfate 325 (65 FE) MG tablet Take 325 mg by mouth daily.    Marland Kitchen Fexofenadine HCl (ALLEGRA ALLERGY PO) Take 1 tablet by mouth daily as needed (for allergies).     Marland Kitchen FLUoxetine (PROZAC) 40 MG capsule Take 40 mg by mouth daily.    . folic acid (FOLVITE) 1 MG tablet Take 1 tablet (1 mg total) by mouth daily. 90 tablet 4  . furosemide (LASIX) 40 MG tablet Take 40 mg by mouth daily.    Marland Kitchen HYDROcodone-acetaminophen (NORCO) 10-325 MG tablet Take 1 tablet by mouth every 6 (six) hours as needed for moderate pain.     . metoprolol succinate (TOPROL-XL) 25 MG 24 hr tablet Take 1 tablet (25 mg total) by mouth daily. 90 tablet 3  . Misc Natural Products (OSTEO BI-FLEX JOINT SHIELD PO) Take 1 capsule by mouth daily.    . nitroGLYCERIN (NITROSTAT) 0.4 MG SL tablet Place 1 tablet (0.4 mg total) under the tongue every 5 (five) minutes x 3 doses as needed. (Patient taking differently: Place 0.4 mg  under the tongue every 5 (five) minutes x 3 doses as needed for chest pain. ) 25 tablet 3  . Omega-3 Fatty Acids (FISH OIL) 1200 MG CAPS Take 1,200 mg by  mouth daily.     . potassium chloride SA (K-DUR,KLOR-CON) 20 MEQ tablet Take 2 tablets (40 mEq total) by mouth daily. 60 tablet 3  . rosuvastatin (CRESTOR) 10 MG tablet Take 10 mg by mouth daily.    Marland Kitchen sulfaSALAzine (AZULFIDINE) 500 MG tablet Take 1 tablet (500 mg total) by mouth 2 (two) times daily. 60 tablet 6  . varenicline (CHANTIX STARTING MONTH PAK) 0.5 MG X 11 & 1 MG X 42 tablet Take one 0.5 mg tablet by mouth once daily for 3 days, then increase to one 0.5 mg tablet twice daily for 4 days, then increase to one 1 mg tablet twice daily. 53 tablet 0  . varenicline (CHANTIX CONTINUING MONTH PAK) 1 MG tablet Take 1 tablet (1 mg total) by mouth 2 (two) times daily. 60 tablet 1   No current facility-administered medications for this encounter.    BP (!) 121/53   Pulse (!) 57   Wt 199 lb (90.3 kg)   SpO2 97%   BMI 33.12 kg/m  General: NAD Neck: No JVD, no thyromegaly or thyroid nodule.  Lungs: Clear to auscultation bilaterally with normal respiratory effort. CV: Nondisplaced PMI.  Heart regular S1/S2, no S3/S4, no murmur.  No peripheral edema.  Bilateral carotid bruits.  Normal pedal pulses.  Abdomen: Soft, nontender, no hepatosplenomegaly, no distention.  Skin: Intact without lesions or rashes.  Neurologic: Alert and oriented x 3.  Psych: Normal affect. Extremities: No clubbing or cyanosis.  HEENT: Normal.   Assessment/Plan: 1. Chronic diastolic CHF with prominent RV failure: Moderately dilated RV with moderately decreased systolic function on 7/58 echo.  On exam, she does not appear volume overloaded.  [place] in 3/19 showed normal right and left heart filling pressures.    - Continue Lasix 40 mg daily.  BMET today.  - Wear compression stockings.  2. Pulmonary hypertension: PASP 74 mmHg on 1/19 echo with appearance of cor pulmonale.  CTA  chest in 1/19 showed chronic fibrotic changes in the lungs but not emphysema.  However, PFTs in 3/19 were suggestive of moderate-severe obstruction consistent with COPD.  RHC showed moderate pulmonary arterial hypertension.  V/Q scan showed no evidence for chronic pulmonary embolus.  My suspicion is that her pulmonary hypertension is primarily group 3 due to COPD.  - I am not inclined to treat with pulmonary vasodilators.  She has an appointment with Dr. Lake Bells with pulmonary soon, and I will get his opinion.   3. Pulmonary fibrosis versus COPD: Noted on CTA chest.  However, PFTs suggested obstructive lung disease/emphysema.  - She should use oxygen with exertion.  - She will have an appointment with pulmonary soon.  4. CAD: h/o BMS to RCA in 2004.  LHC in 3/19 showed 70% in-stent restenosis in the RCA.  With no chest pain, plan to treat medically.  I think that her dyspnea is lung-related.  - Continue ASA 81 and Plavix.  - Continue Crestor 10 mg daily.  Check lipids today.  5. Carotid stenosis: Followed by [facility], repeat carotid dopplers in 11/19 6. Active smoker: She has almost quit with Chantix.  I encouraged her today.  7. PAD: Peripheral arterial dopplers (3/19) showed occluded left SFA.  She has appointment with Dr. Oneida Alar with [facility].   Loralie Champagne 11/19/2017

## 2017-11-20 ENCOUNTER — Other Ambulatory Visit (INDEPENDENT_AMBULATORY_CARE_PROVIDER_SITE_OTHER): Payer: PPO

## 2017-11-20 ENCOUNTER — Encounter: Payer: Self-pay | Admitting: Pulmonary Disease

## 2017-11-20 ENCOUNTER — Ambulatory Visit: Payer: PPO | Admitting: Pulmonary Disease

## 2017-11-20 VITALS — BP 134/72 | HR 67 | Ht 65.0 in | Wt 199.0 lb

## 2017-11-20 DIAGNOSIS — I270 Primary pulmonary hypertension: Secondary | ICD-10-CM

## 2017-11-20 DIAGNOSIS — J432 Centrilobular emphysema: Secondary | ICD-10-CM

## 2017-11-20 DIAGNOSIS — J849 Interstitial pulmonary disease, unspecified: Secondary | ICD-10-CM

## 2017-11-20 DIAGNOSIS — Z720 Tobacco use: Secondary | ICD-10-CM

## 2017-11-20 LAB — C-REACTIVE PROTEIN: CRP: 0.7 mg/dL (ref 0.5–20.0)

## 2017-11-20 LAB — SEDIMENTATION RATE: SED RATE: 58 mm/h — AB (ref 0–30)

## 2017-11-20 MED ORDER — UMECLIDINIUM-VILANTEROL 62.5-25 MCG/INH IN AEPB: 1.0000 | INHALATION_SPRAY | Freq: Every day | RESPIRATORY_TRACT | 0 refills | Status: DC

## 2017-11-20 NOTE — Patient Instructions (Signed)
For pulmonary hypertension: We will check your oxygen level while you are walking today  For pulmonary fibrosis: We will check a high-resolution CT scan of the chest We will check a lab panel to look for evidence of an underlying autoimmune condition which may explain this  For COPD: Take Anoro 1 puff daily no matter how you feel Quit smoking  For tobacco abuse: Stop smoking Continue Chantix  Follow-up in 2-4 weeks with either me or a nurse practitioner to go over the results from today's lab work and a high-resolution CT scan of the chest.

## 2017-11-20 NOTE — Progress Notes (Signed)
Subjective:   PATIENT ID: Shelley [name] GENDER: female DOB: 1940-11-11, MRN: [id number]  Synopsis: Referred in March 2019 by Dr. Aundra Dubin for COPD.  She has pulmonary hypertension and CAD.  She had cardiac stents placed in 2004 by Dr. Lia Foyer.  She developed leg swelling in January 2019 and was found to have pulmonary hypertension.  She also has a history of Crohn's disease. She is also a CF carrier, [id number]  HPI  Chief Complaint  Patient presents with  . Consult    Referred by Dr.McLean for COPD, CHF.  CAT: 24.   "Shelley [name]" is her to see me because of concern for pulmonary fibrosis and COPD. She was diagnosed with pulmonary hypertension this year when she had some leg swelling.  Since then she had a large work up including a heart catheterization which showed pulmonary hypertensiion.    She has diuresed well over the last few months.  She has lost 30 pounds.  She now has some leg pain which she is attributing to her peripheral vascular disease.  She sees Dr. Oneida Alar for this.  She says that her breathing is "OK" unless she walks a long distance or climbs stairs.  She has to stop 1/2 way up stairs.  She doesn't cough or produces mucus.  She used to cough more when she was smoking.  However her breathing has really improved since then.  She had never been told she had COPD prior to this.  She notes that she never struggled with limitation due to dyspnea in the past, more limited by arthritis pain.    She reports no family of lung problems.  No child hood lung problems.  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    She has a granddaughter who died of cystic fibrosis.  She is a known carrier of the cystic fibrosis gene mutation.   She has crohn's disease and was followed by Dr. Maurene Capes.   Past Medical History:  Diagnosis Date  . Anxiety   . Carotid artery disease ([place])    91-63% RICA and LICA 8/46 - Dr. Oneida Alar  . Cataract   .  COPD (chronic obstructive pulmonary disease) ([place])   . Coronary atherosclerosis of native coronary artery    BMS to RCA 2004  . Depression   . DVT (deep venous thrombosis) ([place])   . Essential hypertension, benign   . Fracture Left Great Toe  . Hepatic steatosis   . History of melanoma   . History of oral aphthous ulcers   . History of Salmonella gastroenteritis   . Hx of adenomatous colonic polyps   . Mixed hyperlipidemia   . Myocardial infarction ([place])    IMI 10/04  . Osteoarthritis   . Osteoporosis   . Pancreatitis   . Rectal bleeding   . Regional enteritis of large intestine ([place])   . Sinus polyp   . TIA (transient ischemic attack)   . Tubular adenoma of colon 08/2012     Family History  Problem Relation Age of Onset  . Diabetes type II Mother   . Heart attack Mother 16       questionable  . Uterine cancer Mother   . Heart disease Mother        before age 60  . Varicose Veins Mother   . Other Brother        PVD and hx DVT  . Deep vein thrombosis  Brother   . Breast cancer Sister   . Hyperlipidemia Sister   . Hypertension Sister   . Colon cancer Neg Hx      Social History   Socioeconomic History  . Marital status: Divorced    Spouse name: Not on file  . Number of children: 2  . Years of education: Not on file  . Highest education level: Not on file  Occupational History  . Occupation: Retired    Fish farm manager: RETIRED  Social Needs  . Financial resource strain: Not on file  . Food insecurity:    Worry: Not on file    Inability: Not on file  . Transportation needs:    Medical: Not on file    Non-medical: Not on file  Tobacco Use  . Smoking status: Light Tobacco Smoker    Packs/day: 0.25    Years: 40.00    Pack years: 10.00    Types: Cigarettes    Start date: 06/02/1957  . Smokeless tobacco: Never Used  . Tobacco comment: trying to quit 1/2 pack day - so much stress in her lift  Substance and Sexual Activity  . Alcohol use: No    Alcohol/week: 0.0 oz    . Drug use: No  . Sexual activity: Not on file  Lifestyle  . Physical activity:    Days per week: Not on file    Minutes per session: Not on file  . Stress: Not on file  Relationships  . Social connections:    Talks on phone: Not on file    Gets together: Not on file    Attends religious service: Not on file    Active member of club or organization: Not on file    Attends meetings of clubs or organizations: Not on file    Relationship status: Not on file  . Intimate partner violence:    Fear of current or ex partner: Not on file    Emotionally abused: Not on file    Physically abused: Not on file    Forced sexual activity: Not on file  Other Topics Concern  . Not on file  Social History Narrative  . Not on file     Allergies  Allergen Reactions  . Mercaptopurine Other (See Comments)    REACTION: pancreatitis  . Penicillins Rash and Other (See Comments)    Has patient had a PCN reaction causing immediate rash, facial/tongue/throat swelling, SOB or lightheadedness with hypotension: No Has patient had a PCN reaction causing severe rash involving mucus membranes or skin necrosis: No Has patient had a PCN reaction that required hospitalization: No Has patient had a PCN reaction occurring within the last 10 years: No If all of the above answers are "NO", then may proceed with Cephalosporin use.   . Sulfa Antibiotics Rash     Outpatient Medications Prior to Visit  Medication Sig Dispense Refill  . aspirin 81 MG tablet Take 81 mg by mouth daily.      . clopidogrel (PLAVIX) 75 MG tablet Take 1 tablet (75 mg total) by mouth daily. 30 tablet 6  . Cyanocobalamin (B-12 PO) Take 1 tablet by mouth See admin instructions. Take 1 capsule every day x 7 days every other week     . dicyclomine (BENTYL) 10 MG capsule Take 1 capsule (10 mg total) by mouth as needed for spasms. 30 capsule 3  . ferrous sulfate 325 (65 FE) MG tablet Take 325 mg by mouth daily.    Marland Kitchen Fexofenadine HCl (ALLEGRA  ALLERGY PO) Take 1 tablet by mouth daily as needed (for allergies).     Marland Kitchen FLUoxetine (PROZAC) 40 MG capsule Take 40 mg by mouth daily.    . folic acid (FOLVITE) 1 MG tablet Take 1 tablet (1 mg total) by mouth daily. 90 tablet 4  . furosemide (LASIX) 40 MG tablet Take 40 mg by mouth daily.    Marland Kitchen HYDROcodone-acetaminophen (NORCO) 10-325 MG tablet Take 1 tablet by mouth every 6 (six) hours as needed for moderate pain.     . metoprolol succinate (TOPROL-XL) 25 MG 24 hr tablet Take 1 tablet (25 mg total) by mouth daily. 90 tablet 3  . Misc Natural Products (OSTEO BI-FLEX JOINT SHIELD PO) Take 1 capsule by mouth daily.    . nitroGLYCERIN (NITROSTAT) 0.4 MG SL tablet Place 1 tablet (0.4 mg total) under the tongue every 5 (five) minutes x 3 doses as needed. (Patient taking differently: Place 0.4 mg under the tongue every 5 (five) minutes x 3 doses as needed for chest pain. ) 25 tablet 3  . Omega-3 Fatty Acids (FISH OIL) 1200 MG CAPS Take 1,200 mg by mouth daily.     . potassium chloride SA (K-DUR,KLOR-CON) 20 MEQ tablet Take 2 tablets (40 mEq total) by mouth daily. 60 tablet 3  . rosuvastatin (CRESTOR) 10 MG tablet Take 10 mg by mouth daily.    Marland Kitchen sulfaSALAzine (AZULFIDINE) 500 MG tablet Take 1 tablet (500 mg total) by mouth 2 (two) times daily. 60 tablet 6  . varenicline (CHANTIX CONTINUING MONTH PAK) 1 MG tablet Take 1 tablet (1 mg total) by mouth 2 (two) times daily. (Patient not taking: Reported on 11/20/2017) 60 tablet 1  . varenicline (CHANTIX STARTING MONTH PAK) 0.5 MG X 11 & 1 MG X 42 tablet Take one 0.5 mg tablet by mouth once daily for 3 days, then increase to one 0.5 mg tablet twice daily for 4 days, then increase to one 1 mg tablet twice daily. (Patient not taking: Reported on 11/20/2017) 53 tablet 0   No facility-administered medications prior to visit.     Review of Systems  Constitutional: Negative for chills, fever, malaise/fatigue and weight loss.  HENT: Negative for congestion, nosebleeds,  sinus pain and sore throat.   Eyes: Negative for photophobia, pain and discharge.  Respiratory: Positive for cough, sputum production and shortness of breath. Negative for hemoptysis and wheezing.   Cardiovascular: Positive for leg swelling. Negative for chest pain, palpitations and orthopnea.  Gastrointestinal: Negative for abdominal pain, constipation, diarrhea, nausea and vomiting.  Genitourinary: Negative for dysuria, frequency, hematuria and urgency.  Musculoskeletal: Negative for back pain, joint pain, myalgias and neck pain.  Skin: Negative for itching and rash.  Neurological: Negative for tingling, tremors, sensory change, speech change, focal weakness, seizures, weakness and headaches.  Psychiatric/Behavioral: Negative for memory loss, substance abuse and suicidal ideas. The patient is not nervous/anxious.       Objective:  Physical Exam   Vitals:   11/20/17 0935  BP: 134/72  Pulse: 67  SpO2: 90%  Weight: 199 lb (90.3 kg)  Height: _0  (1.651 m)   RA  Gen: chronically ill appearing, no acute distress HENT: NCAT, OP clear, neck supple without masses Eyes: PERRL, EOMi Lymph: no cervical lymphadenopathy PULM: Crackles bilaterally B CV: RRR, no mgr, no JVD GI: BS+, soft, nontender, no hsm Derm: significant edema and chronic vascular changes, no rash or skin breakdown MSK: normal bulk and tone Neuro: A&Ox4, CN II-XII intact, strength 5/5 in all  4 extremities Psyche: normal mood and affect   CBC    Component Value Date/Time   WBC 5.9 10/30/2017 0747   RBC 4.76 10/30/2017 0747   HGB 15.3 (H) 10/30/2017 0747   HCT 47.0 (H) 10/30/2017 0747   PLT 206 10/30/2017 0747   MCV 98.7 10/30/2017 0747   MCH 32.1 10/30/2017 0747   MCHC 32.6 10/30/2017 0747   RDW 14.1 10/30/2017 0747   LYMPHSABS 1.5 11/09/2014 1231   MONOABS 0.6 11/09/2014 1231   EOSABS 0.2 11/09/2014 1231   BASOSABS 0.0 11/09/2014 1231     Chest imaging: CTA chest (1/19): No PE, air trapping noted,  some centrilobular emphysema noted, some paraseptal emphysema noted, fibrotic change in the periphery and appears to be worse in the bases V/Q scan (3/19): No evidence for chronic PE.    PFT: - PFTs (3/19): Ratio 63%, FEV1 1.28 L 61% predicted, total lung capacity 4.2 L 80% predicted, DLCO 8.10 31% predicted  Labs:  Path:  Echo:  Echo (1/19) with EF 55-60%, mild LVH, mild MR, moderate RV dilation with moderately decreased systolic function, moderate TR, PASP 74 mmHg.   Heart Catheterization: - RHC (3/19): mean RA 2, PA 54/16 mean 28, mean PCWP 8, CI 2.55, PVR 3.9 WU.  Records from March 2019 cardiology visit with Dr. Algernon Huxley reviewed where she was evaluated for ongoing pulmonary hypertension.  She was noted to have smoked less than 1/2 pack/day recently.  In January 2019 she developed leg swelling.  In March 2019 she had a right and left heart cath which showed some restenosis in the stent which had been placed previously in the right coronary artery.  She had moderate pulmonary hypertension noted on the right heart cath.  VQ scan showed no evidence of a chronic pulmonary embolism.  Lung function testing showed moderate to severe airflow obstruction.  She had been started on Chantix.  She lost weight, approximately 11 pounds.     Assessment & Plan:   Centrilobular emphysema ([place])  Interstitial pulmonary disease ([place]) - Plan: ANA, C-reactive protein, Sedimentation rate, Rheumatoid factor, Anti-Jo 1 antibody, IgG, Aldolase, Centromere Antibodies, Anti-scleroderma antibody, Sjogrens syndrome-A extractable nuclear antibody, Sjogrens syndrome-B extractable nuclear antibody, Hypersensitivity pnuemonitis profile, CT Chest High Resolution  Tobacco abuse  Primary pulmonary hypertension ([place])  Discussion: Shelley [name] has been referred to me for evaluation of pulmonary fibrosis, COPD, emphysema all in the setting of recently diagnosed pulmonary hypertension.  Her COPD is moderate based on airflow  obstruction and she has centrilobular emphysema.  Her symptoms have improved significantly since quitting smoking.  Typically I would not treat based on her lack of symptoms and only moderate airflow obstruction but considering the severity of her pulmonary hypertension I think it is best to give her a trial of a combination long-acting bronchodilator medicine.  In regards to the pulmonary fibrosis: It is not clear what is causing this.  The CT angiogram of the chest was not really an appropriate test to assess this so we will get a high-resolution CT scan of the chest to assess for conditions like UIP.  We need to consider fibrotic nonspecific interstitial pneumonitis considering her underlying history of Crohn's disease.  In regards to tobacco use: She desperately needs to quit smoking.  She is done a good job with Chantix.  For pulmonary hypertension: I agree with Dr. Algernon Huxley that this is likely all [place] group 3 disease.  We will assess for ambulatory hypoxemia with testing today.  We probably need to  get a sleep study at some point.  Plan: For pulmonary hypertension: We will check your oxygen level while you are walking today  For pulmonary fibrosis: We will check a high-resolution CT scan of the chest We will check a lab panel to look for evidence of an underlying autoimmune condition which may explain this  For COPD: Take Anoro 1 puff daily no matter how you feel Quit smoking  For tobacco abuse: Stop smoking Continue Chantix  Follow-up in 2-4 weeks with either me or a nurse practitioner to go over the results from today's lab work and a high-resolution CT scan of the chest.    Current Outpatient Medications:  .  aspirin 81 MG tablet, Take 81 mg by mouth daily.  , Disp: , Rfl:  .  clopidogrel (PLAVIX) 75 MG tablet, Take 1 tablet (75 mg total) by mouth daily., Disp: 30 tablet, Rfl: 6 .  Cyanocobalamin (B-12 PO), Take 1 tablet by mouth See admin instructions.  Take 1 capsule every day x 7 days every other week , Disp: , Rfl:  .  dicyclomine (BENTYL) 10 MG capsule, Take 1 capsule (10 mg total) by mouth as needed for spasms., Disp: 30 capsule, Rfl: 3 .  ferrous sulfate 325 (65 FE) MG tablet, Take 325 mg by mouth daily., Disp: , Rfl:  .  Fexofenadine HCl (ALLEGRA ALLERGY PO), Take 1 tablet by mouth daily as needed (for allergies). , Disp: , Rfl:  .  FLUoxetine (PROZAC) 40 MG capsule, Take 40 mg by mouth daily., Disp: , Rfl:  .  folic acid (FOLVITE) 1 MG tablet, Take 1 tablet (1 mg total) by mouth daily., Disp: 90 tablet, Rfl: 4 .  furosemide (LASIX) 40 MG tablet, Take 40 mg by mouth daily., Disp: , Rfl:  .  HYDROcodone-acetaminophen (NORCO) 10-325 MG tablet, Take 1 tablet by mouth every 6 (six) hours as needed for moderate pain. , Disp: , Rfl:  .  metoprolol succinate (TOPROL-XL) 25 MG 24 hr tablet, Take 1 tablet (25 mg total) by mouth daily., Disp: 90 tablet, Rfl: 3 .  Misc Natural Products (OSTEO BI-FLEX JOINT SHIELD PO), Take 1 capsule by mouth daily., Disp: , Rfl:  .  nitroGLYCERIN (NITROSTAT) 0.4 MG SL tablet, Place 1 tablet (0.4 mg total) under the tongue every 5 (five) minutes x 3 doses as needed. (Patient taking differently: Place 0.4 mg under the tongue every 5 (five) minutes x 3 doses as needed for chest pain. ), Disp: 25 tablet, Rfl: 3 .  Omega-3 Fatty Acids (FISH OIL) 1200 MG CAPS, Take 1,200 mg by mouth daily. , Disp: , Rfl:  .  potassium chloride SA (K-DUR,KLOR-CON) 20 MEQ tablet, Take 2 tablets (40 mEq total) by mouth daily., Disp: 60 tablet, Rfl: 3 .  rosuvastatin (CRESTOR) 10 MG tablet, Take 10 mg by mouth daily., Disp: , Rfl:  .  sulfaSALAzine (AZULFIDINE) 500 MG tablet, Take 1 tablet (500 mg total) by mouth 2 (two) times daily., Disp: 60 tablet, Rfl: 6 .  umeclidinium-vilanterol (ANORO ELLIPTA) 62.5-25 MCG/INH AEPB, Inhale 1 puff into the lungs daily., Disp: 1 each, Rfl: 0 .  varenicline (CHANTIX CONTINUING MONTH PAK) 1 MG tablet, Take 1  tablet (1 mg total) by mouth 2 (two) times daily. (Patient not taking: Reported on 11/20/2017), Disp: 60 tablet, Rfl: 1 .  varenicline (CHANTIX STARTING MONTH PAK) 0.5 MG X 11 & 1 MG X 42 tablet, Take one 0.5 mg tablet by mouth once daily for 3 days, then increase to one  0.5 mg tablet twice daily for 4 days, then increase to one 1 mg tablet twice daily. (Patient not taking: Reported on 11/20/2017), Disp: 53 tablet, Rfl: 0

## 2017-11-21 ENCOUNTER — Telehealth (HOSPITAL_COMMUNITY): Payer: Self-pay

## 2017-11-21 ENCOUNTER — Encounter (HOSPITAL_COMMUNITY): Payer: Self-pay

## 2017-11-21 DIAGNOSIS — E785 Hyperlipidemia, unspecified: Secondary | ICD-10-CM

## 2017-11-21 LAB — CENTROMERE ANTIBODIES: CENTROMERE AB SCREEN: NEGATIVE AI

## 2017-11-21 LAB — ANTI-JO 1 ANTIBODY, IGG: Anti JO-1: <0.2 AI (ref 0.0–0.9)

## 2017-11-21 MED ORDER — ROSUVASTATIN CALCIUM 10 MG PO TABS: 20.0000 mg | ORAL_TABLET | Freq: Every day | ORAL | 3 refills | Status: DC

## 2017-11-21 NOTE — Telephone Encounter (Signed)
Notes recorded by Shirley Muscat, RN on 11/21/2017 at [place] PM EDT Pt aware of results, agreeable to med changes (changes made in [facility]), and Lab appointment made (orders placed)   ------  Notes recorded by Larey Dresser, MD on 11/21/2017 at 12:40 PM EDT Goal LDL < 70. Increase Crestor to 20 mg daily with lipids/LFTs in 2 months.

## 2017-11-24 LAB — ANTI-SCLERODERMA ANTIBODY: SCLERODERMA (SCL-70) (ENA) ANTIBODY, IGG: NEGATIVE AI

## 2017-11-24 LAB — HYPERSENSITIVITY PNUEMONITIS PROFILE
ASPERGILLUS FUMIGATUS: NEGATIVE
Faenia retivirgula: NEGATIVE
Pigeon Serum: NEGATIVE
S. VIRIDIS: NEGATIVE
T. CANDIDUS: NEGATIVE
T. VULGARIS: NEGATIVE

## 2017-11-24 LAB — ANA: ANA: NEGATIVE

## 2017-11-24 LAB — ALDOLASE: ALDOLASE: 3.1 U/L (ref <=8.1)

## 2017-11-24 LAB — SJOGRENS SYNDROME-A EXTRACTABLE NUCLEAR ANTIBODY: SSA (Ro) (ENA) Antibody, IgG: <1 AI

## 2017-11-24 LAB — SJOGRENS SYNDROME-B EXTRACTABLE NUCLEAR ANTIBODY: SSB (LA) (ENA) ANTIBODY, IGG: NEGATIVE AI

## 2017-11-24 LAB — RHEUMATOID FACTOR: Rhuematoid fact SerPl-aCnc: <14 IU/mL (ref <14)

## 2017-12-02 DIAGNOSIS — I5032 Chronic diastolic (congestive) heart failure: Secondary | ICD-10-CM | POA: Diagnosis not present

## 2017-12-02 DIAGNOSIS — R0602 Shortness of breath: Secondary | ICD-10-CM | POA: Diagnosis not present

## 2017-12-02 DIAGNOSIS — R2689 Other abnormalities of gait and mobility: Secondary | ICD-10-CM | POA: Diagnosis not present

## 2017-12-04 ENCOUNTER — Ambulatory Visit (HOSPITAL_COMMUNITY)
Admission: RE | Admit: 2017-12-04 | Discharge: 2017-12-04 | Disposition: A | Discharge status: Home / Self Care | Payer: PPO | Source: Ambulatory Visit | Attending: Pulmonary Disease | Admitting: Pulmonary Disease

## 2017-12-04 DIAGNOSIS — E042 Nontoxic multinodular goiter: Secondary | ICD-10-CM | POA: Insufficient documentation

## 2017-12-04 DIAGNOSIS — J849 Interstitial pulmonary disease, unspecified: Secondary | ICD-10-CM | POA: Diagnosis not present

## 2017-12-04 DIAGNOSIS — I2510 Atherosclerotic heart disease of native coronary artery without angina pectoris: Secondary | ICD-10-CM | POA: Diagnosis not present

## 2017-12-04 DIAGNOSIS — J439 Emphysema, unspecified: Secondary | ICD-10-CM | POA: Insufficient documentation

## 2017-12-04 DIAGNOSIS — R590 Localized enlarged lymph nodes: Secondary | ICD-10-CM | POA: Diagnosis not present

## 2017-12-04 DIAGNOSIS — I700 Atherosclerosis of aorta: Secondary | ICD-10-CM | POA: Diagnosis not present

## 2017-12-04 DIAGNOSIS — I517 Cardiomegaly: Secondary | ICD-10-CM | POA: Insufficient documentation

## 2017-12-11 ENCOUNTER — Ambulatory Visit: Payer: PPO | Admitting: Vascular Surgery

## 2017-12-11 DIAGNOSIS — I2510 Atherosclerotic heart disease of native coronary artery without angina pectoris: Secondary | ICD-10-CM | POA: Diagnosis not present

## 2017-12-11 DIAGNOSIS — Z5181 Encounter for therapeutic drug level monitoring: Secondary | ICD-10-CM | POA: Diagnosis not present

## 2017-12-11 DIAGNOSIS — M5136 Other intervertebral disc degeneration, lumbar region: Secondary | ICD-10-CM | POA: Diagnosis not present

## 2017-12-11 DIAGNOSIS — E041 Nontoxic single thyroid nodule: Secondary | ICD-10-CM | POA: Diagnosis not present

## 2017-12-11 DIAGNOSIS — J8410 Pulmonary fibrosis, unspecified: Secondary | ICD-10-CM | POA: Diagnosis not present

## 2017-12-11 DIAGNOSIS — E785 Hyperlipidemia, unspecified: Secondary | ICD-10-CM | POA: Diagnosis not present

## 2017-12-11 DIAGNOSIS — I5032 Chronic diastolic (congestive) heart failure: Secondary | ICD-10-CM | POA: Diagnosis not present

## 2017-12-11 DIAGNOSIS — Z1329 Encounter for screening for other suspected endocrine disorder: Secondary | ICD-10-CM | POA: Diagnosis not present

## 2017-12-12 LAB — TSH: TSH: 1.3 (ref <=5.90)

## 2017-12-16 ENCOUNTER — Telehealth: Payer: Self-pay | Admitting: Gastroenterology

## 2017-12-16 MED ORDER — FOLIC ACID 1 MG PO TABS: 1.0000 mg | ORAL_TABLET | Freq: Every day | ORAL | 4 refills | Status: DC

## 2017-12-16 NOTE — Telephone Encounter (Signed)
Pharmacy calling in regards to refill for folic acid, pharmacy stated that they faxed a request over and got a response stating that pt was unknown to prescriber. Pls call pharmacy to clarify. Thank you

## 2017-12-16 NOTE — Telephone Encounter (Signed)
Dr Silverio Decamp is it ok to send rx over for Folic Acid for this patient?

## 2017-12-16 NOTE — Telephone Encounter (Signed)
ok 

## 2017-12-16 NOTE — Telephone Encounter (Signed)
Sent folic acid electronically and called pharmacy to make them aware  Also called pt to inform med was sent

## 2017-12-16 NOTE — Telephone Encounter (Signed)
Patient states she called her pharmacy again today and was told her prescription for Folic acid was denied again. Patient is requesting a call back to phone # [phone].

## 2017-12-18 ENCOUNTER — Ambulatory Visit: Payer: PPO | Admitting: Vascular Surgery

## 2017-12-18 ENCOUNTER — Other Ambulatory Visit: Payer: Self-pay

## 2017-12-18 ENCOUNTER — Encounter: Payer: Self-pay | Admitting: Vascular Surgery

## 2017-12-18 VITALS — BP 126/67 | HR 63 | Temp 97.4°F | Resp 16 | Ht 65.0 in | Wt 199.0 lb

## 2017-12-18 DIAGNOSIS — I739 Peripheral vascular disease, unspecified: Secondary | ICD-10-CM | POA: Diagnosis not present

## 2017-12-18 MED ORDER — GABAPENTIN 100 MG PO CAPS: 100.0000 mg | ORAL_CAPSULE | Freq: Two times a day (BID) | ORAL | 6 refills | Status: DC

## 2017-12-18 NOTE — Progress Notes (Signed)
Patient is a 77 year old female referred for evaluation of peripheral arterial disease.  We have seen the patient on several occasions in the past for moderate asymptomatic carotid stenosis.  She complains that over the last few years she has had progressive weakness in both legs.  She also describes numbness and tingling from the knee down both legs.  She also has coolness in both feet.  She does not really describe claudication symptoms.  She states that both legs just get weak after walking about 1 block.  She denies rest pain.  She has no nonhealing wounds.  She also complains of pain in her right buttock and thigh but this only occurs when lying down and not with activity.  She has severe underlying pulmonary dysfunction and is on home oxygen intermittently.  She has severe pulmonary fibrosis and COPD.  Her lung function restricts her movement almost as much as her leg problem.  She does have a history of chronic back pain and has been seen by Dr. Ellene Route in the past.  Other chronic medical problems include coronary artery disease which has been stable.  She also has a history of hyperlipidemia which has been stable.  Review of systems: She has shortness of breath with minimal exertion.  She denies chest pain.  Past Medical History:  Diagnosis Date  . Anxiety   . Carotid artery disease ([place])    06-26% RICA and LICA 9/48 - Dr. Oneida Alar  . Cataract   . COPD (chronic obstructive pulmonary disease) ([place])   . Coronary atherosclerosis of native coronary artery    BMS to RCA 2004  . Depression   . DVT (deep venous thrombosis) ([place])   . Essential hypertension, benign   . Fracture Left Great Toe  . Hepatic steatosis   . History of melanoma   . History of oral aphthous ulcers   . History of Salmonella gastroenteritis   . Hx of adenomatous colonic polyps   . Mixed hyperlipidemia   . Myocardial infarction ([place])    IMI 10/04  . Osteoarthritis   . Osteoporosis   . Pancreatitis   . Rectal bleeding    . Regional enteritis of large intestine ([place])   . Sinus polyp   . TIA (transient ischemic attack)   . Tubular adenoma of colon 08/2012   Past Surgical History:  Procedure Laterality Date  . CATARACT EXTRACTION Bilateral   . Melanoma resection  1996  . RIGHT/LEFT HEART CATH AND CORONARY ANGIOGRAPHY N/A 10/30/2017   Procedure: RIGHT/LEFT HEART CATH AND CORONARY ANGIOGRAPHY;  Surgeon: Larey Dresser, MD;  Location: [facility];  Service: Cardiovascular;  Laterality: N/A;  . TOTAL KNEE ARTHROPLASTY Right Feb 2012    Current Outpatient Medications on File Prior to Visit  Medication Sig Dispense Refill  . aspirin 81 MG tablet Take 81 mg by mouth daily.      . clopidogrel (PLAVIX) 75 MG tablet Take 1 tablet (75 mg total) by mouth daily. 30 tablet 6  . Cyanocobalamin (B-12 PO) Take 1 tablet by mouth See admin instructions. Take 1 capsule every day x 7 days every other week     . dicyclomine (BENTYL) 10 MG capsule Take 1 capsule (10 mg total) by mouth as needed for spasms. 30 capsule 3  . ferrous sulfate 325 (65 FE) MG tablet Take 325 mg by mouth daily.    Marland Kitchen Fexofenadine HCl (ALLEGRA ALLERGY PO) Take 1 tablet by mouth daily as needed (for allergies).     Marland Kitchen  FLUoxetine (PROZAC) 40 MG capsule Take 40 mg by mouth daily.    . folic acid (FOLVITE) 1 MG tablet Take 1 tablet (1 mg total) by mouth daily. 90 tablet 4  . furosemide (LASIX) 40 MG tablet Take 40 mg by mouth daily.    Marland Kitchen HYDROcodone-acetaminophen (NORCO) 10-325 MG tablet Take 1 tablet by mouth every 6 (six) hours as needed for moderate pain.     . metoprolol succinate (TOPROL-XL) 25 MG 24 hr tablet Take 1 tablet (25 mg total) by mouth daily. 90 tablet 3  . Misc Natural Products (OSTEO BI-FLEX JOINT SHIELD PO) Take 1 capsule by mouth daily.    . nitroGLYCERIN (NITROSTAT) 0.4 MG SL tablet Place 1 tablet (0.4 mg total) under the tongue every 5 (five) minutes x 3 doses as needed. (Patient taking differently: Place 0.4 mg under the tongue  every 5 (five) minutes x 3 doses as needed for chest pain. ) 25 tablet 3  . Omega-3 Fatty Acids (FISH OIL) 1200 MG CAPS Take 1,200 mg by mouth daily.     . potassium chloride SA (K-DUR,KLOR-CON) 20 MEQ tablet Take 2 tablets (40 mEq total) by mouth daily. 60 tablet 3  . rosuvastatin (CRESTOR) 10 MG tablet Take 2 tablets (20 mg total) by mouth daily. 30 tablet 3  . sulfaSALAzine (AZULFIDINE) 500 MG tablet Take 1 tablet (500 mg total) by mouth 2 (two) times daily. 60 tablet 6  . umeclidinium-vilanterol (ANORO ELLIPTA) 62.5-25 MCG/INH AEPB Inhale 1 puff into the lungs daily. 1 each 0  . varenicline (CHANTIX CONTINUING MONTH PAK) 1 MG tablet Take 1 tablet (1 mg total) by mouth 2 (two) times daily. 60 tablet 1  . varenicline (CHANTIX STARTING MONTH PAK) 0.5 MG X 11 & 1 MG X 42 tablet Take one 0.5 mg tablet by mouth once daily for 3 days, then increase to one 0.5 mg tablet twice daily for 4 days, then increase to one 1 mg tablet twice daily. 53 tablet 0   No current facility-administered medications on file prior to visit.     Allergies  Allergen Reactions  . Doxycycline Other (See Comments)    Poss. Photosensitivity  . Mercaptopurine Other (See Comments)    REACTION: pancreatitis  . Simvastatin Other (See Comments)    Severe leg aches  . Sulfasalazine   . Penicillins Rash and Other (See Comments)    Has patient had a PCN reaction causing immediate rash, facial/tongue/throat swelling, SOB or lightheadedness with hypotension: No Has patient had a PCN reaction causing severe rash involving mucus membranes or skin necrosis: No Has patient had a PCN reaction that required hospitalization: No Has patient had a PCN reaction occurring within the last 10 years: No If all of the above answers are "NO", then may proceed with Cephalosporin use.   . Sulfa Antibiotics Rash    Physical exam:  Vitals:   12/18/17 1252  BP: 126/67  Pulse: 63  Resp: 16  Temp: (!) 97.4 F (36.3 C)  TempSrc: Oral   SpO2: 93%  Weight: 199 lb (90.3 kg)  Height: _0  (1.651 m)   Neck: No carotid bruits  Chest: Clear to auscultation bilaterally  Cardiac: Regular rate and rhythm  Abdomen: Soft nontender nondistended no mass  Extremities: 2+ femoral pulses bilaterally absent popliteal and pedal pulses  Skin: No open ulcers or rash  Neuro: Symmetric upper extremity and lower extremity strength which is 5/5.  Subjectively decreased sensation from the knee down bilaterally.  Data: Patient recently had  segmental pressures as well as a duplex ultrasound of both lower extremities at Dr. Carmelina Noun office.  This showed evidence of left superficial femoral artery occlusion.  She had a 50% stenosis of the right superficial femoral artery.  She had bilateral tibial disease.  ABI on the right was 1.0 and in the normal range.  Left ABI was 0.75.  Carotid  Assessment: Patient with multifactorial reasons for her lower extremity symptoms.  Her symptoms that occur while laying in bed of buttock and thigh pain is not classically related to arterial occlusive disease.  Certainly the weakness that she experiences in her legs could be related to arterial occlusive disease but she does not really describe classic claudication.  She is also somewhat limited in her walking distance by underlying pulmonary dysfunction.  I believe the coolness numbness and tingling that she has in both lower extremities is secondary to peripheral neuropathy and she has multiple possible reasons for this.  These include her vitamin B12 deficiency in the past as well as peripheral arterial disease and previous back and neurologic issues.  She does have reasonable perfusion to both lower extremities and she certainly is not at risk of limb loss currently.  Plan: #1 peripheral neuropathy I started her on Neurontin 100 mg twice a day today.  She has some unsteadiness in her gait so I started this at a low dose.  I will leave it at the discretion of her  primary care physician whether or not they wish to titrate this up or stop it in the future if she is not getting any relief of symptoms.  2.  Peripheral arterial disease overall very mild with adequate perfusion to both lower extremities.  I discussed with the patient the possibility of an aortogram bilateral lower extremity runoff possible intervention.  Certainly we would probably be able to do a percutaneous option to treat both superficial femoral artery narrowings.  However, I am not sure the disc is going to improve her symptoms overall it certainly would not be curative of her symptoms.  She will think about whether or not she wishes to proceed with this.  I discussed her today there would be some risk involved but it would be fairly low risk overall.  Right buttock thigh leg pain most likely secondary to neurologic etiology.  The patient will decide if she wishes to pursue further follow-up with Dr. Ellene Route.  She will return to see our nurse practitioner in November 2019.  At that point we will repeat her ABIs as well as her carotid duplex scan to make sure she has not had any significant decline.  She will call sooner if she has any worsening problems.  Ruta Hinds, MD [facility] Office: [phone] Pager: [phone]

## 2018-01-01 ENCOUNTER — Encounter: Payer: Self-pay | Admitting: Pulmonary Disease

## 2018-01-01 ENCOUNTER — Ambulatory Visit: Payer: PPO | Admitting: Pulmonary Disease

## 2018-01-01 VITALS — BP 124/68 | HR 69 | Ht 65.0 in | Wt 199.0 lb

## 2018-01-01 DIAGNOSIS — E041 Nontoxic single thyroid nodule: Secondary | ICD-10-CM | POA: Diagnosis not present

## 2018-01-01 DIAGNOSIS — J432 Centrilobular emphysema: Secondary | ICD-10-CM

## 2018-01-01 DIAGNOSIS — R2689 Other abnormalities of gait and mobility: Secondary | ICD-10-CM | POA: Diagnosis not present

## 2018-01-01 DIAGNOSIS — I2729 Other secondary pulmonary hypertension: Secondary | ICD-10-CM

## 2018-01-01 DIAGNOSIS — J84112 Idiopathic pulmonary fibrosis: Secondary | ICD-10-CM | POA: Diagnosis not present

## 2018-01-01 DIAGNOSIS — R0602 Shortness of breath: Secondary | ICD-10-CM | POA: Diagnosis not present

## 2018-01-01 DIAGNOSIS — I5032 Chronic diastolic (congestive) heart failure: Secondary | ICD-10-CM | POA: Diagnosis not present

## 2018-01-01 MED ORDER — UMECLIDINIUM-VILANTEROL 62.5-25 MCG/INH IN AEPB: 1.0000 | INHALATION_SPRAY | Freq: Every day | RESPIRATORY_TRACT | 0 refills | Status: AC

## 2018-01-01 MED ORDER — UMECLIDINIUM-VILANTEROL 62.5-25 MCG/INH IN AEPB: 1.0000 | INHALATION_SPRAY | Freq: Every day | RESPIRATORY_TRACT | 0 refills | Status: DC

## 2018-01-01 NOTE — Progress Notes (Signed)
Subjective:   PATIENT ID: Shelley [name] GENDER: female DOB: 05/30/1941, MRN: [id number]  Synopsis: Referred in March 2019 by Dr. Aundra Dubin for COPD.  She has pulmonary hypertension and CAD.  She had cardiac stents placed in 2004 by Dr. Lia Foyer.  She developed leg swelling in January 2019 and was found to have pulmonary hypertension.  She also has a history of Crohn's disease. 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  She quit smoking in 2019.   HPI  Chief Complaint  Patient presents with  . Follow-up    4wk rov- pt reports of sob with exertion.    She continues to have dyspnea: > when she gets up and does anything in the house  Chronic respiratory failure with hypoxemia: > only as needed use right now  Cigarettes: > she continues to use Chantix > she has not smoked since 09/2017, she even tried one or two every now and then and didn't like   Past Medical History:  Diagnosis Date  . Anxiety   . Carotid artery disease ([place])    75-73% RICA and LICA 2/25 - Dr. Oneida Alar  . Cataract   . COPD (chronic obstructive pulmonary disease) ([place])   . Coronary atherosclerosis of native coronary artery    BMS to RCA 2004  . Depression   . DVT (deep venous thrombosis) ([place])   . Essential hypertension, benign   . Fracture Left Great Toe  . Hepatic steatosis   . History of melanoma   . History of oral aphthous ulcers   . History of Salmonella gastroenteritis   . Hx of adenomatous colonic polyps   . Mixed hyperlipidemia   . Myocardial infarction ([place])    IMI 10/04  . Osteoarthritis   . Osteoporosis   . Pancreatitis   . Rectal bleeding   . Regional enteritis of large intestine ([place])   . Sinus polyp   . TIA (transient ischemic attack)   . Tubular adenoma of colon 08/2012        Review of Systems  Constitutional: Negative for chills, fever, malaise/fatigue and weight loss.  HENT: Negative for congestion, nosebleeds, sinus [name] and sore throat.   Eyes: Negative for photophobia, [name]  and discharge.  Respiratory: Positive for cough, sputum production and shortness of breath. Negative for hemoptysis and wheezing.   Cardiovascular: Positive for leg swelling. Negative for chest [name], palpitations and orthopnea.  Gastrointestinal: Negative for abdominal [name], constipation, diarrhea, nausea and vomiting.  Genitourinary: Negative for dysuria, frequency, hematuria and urgency.  Musculoskeletal: Negative for back [name], joint [name], myalgias and neck [name].  Skin: Negative for itching and rash.  Neurological: Negative for tingling, tremors, sensory change, speech change, focal weakness, seizures, weakness and headaches.  Psychiatric/Behavioral: Negative for memory loss, substance abuse and suicidal ideas. The patient is not nervous/anxious.       Objective:  Physical Exam   Vitals:   01/01/18 0958  BP: 124/68  Pulse: 69  SpO2: 91%  Weight: 199 lb (90.3 kg)  Height: 5' 5" (1.651 m)   RA  Gen: well appearing HENT: OP clear, TM's clear, neck supple PULM: Crackles bases B, normal percussion CV: RRR, no mgr, trace edema GI: BS+, soft, nontender Derm: no cyanosis or rash Psyche: normal mood and affect    CBC    Component Value Date/Time   WBC 5.9 10/30/2017 0747   RBC 4.76 10/30/2017 0747   HGB 15.3 (H) 10/30/2017 0747   HCT 47.0 (H) 10/30/2017 0747  PLT 206 10/30/2017 0747   MCV 98.7 10/30/2017 0747   MCH 32.1 10/30/2017 0747   MCHC 32.6 10/30/2017 0747   RDW 14.1 10/30/2017 0747   LYMPHSABS 1.5 11/09/2014 1231   MONOABS 0.6 11/09/2014 1231   EOSABS 0.2 11/09/2014 1231   BASOSABS 0.0 11/09/2014 1231     Chest imaging: CTA chest (1/19): No PE, air trapping noted, some centrilobular emphysema noted, some paraseptal emphysema noted, fibrotic change in the periphery and appears to be worse in the bases V/Q scan (3/19): No evidence for chronic PE.  HRCT 10/2017: images independently reviewed: peripheral based recitulation, interlobular thickening and  honeycombing worse in the bases, some traction bronchiectasis, some areas of preserved lung noted. Per radiology probably UIP, possible fibrotic NSIP; by [facility] read this is consistent with UIP; paratracheal adenopathy, thyroid nodule 2 cm, cardiomegally   PFT: - PFTs (3/19): Ratio 63%, FEV1 1.28 L 61% predicted, total lung capacity 4.2 L 80% predicted, DLCO 8.10 31% predicted  Labs: 10/2017: ANA neg, SCL 70 neg, Anti-Jo-1 neg, Centromere neg, SSA/SSB neg, RF neg, HP panel neg  Path:  Echo:  Echo (1/19) with EF 55-60%, mild LVH, mild MR, moderate RV dilation with moderately decreased systolic function, moderate TR, PASP 74 mmHg.   Heart Catheterization: - RHC (3/19): mean RA 2, PA 54/16 mean 28, mean PCWP 8, CI 2.55, PVR 3.9 WU.      Assessment & Plan:   Thyroid nodule - Plan: US THYROID  IPF (idiopathic pulmonary fibrosis) (HCC)  Centrilobular emphysema (HCC)  Other secondary pulmonary hypertension (HCC)  Discussion: Shelley [name] has IPF.  Blood work did not show evidence of an underlying connective tissue disease, and a CT scan of her chest by my read of these images is consistent with usual interstitial pneumonitis.  I am hopeful that the fact that she has underlying Crohn's disease may actually be a good prognostic sign.  Regardless I think it safest to treat her as if she has IPF.  I am going to use an attenuated dose of Ofev because of her history of Crohn's and the high likelihood of GI complications.  She only has moderate airflow obstruction so it is not too surprising that the Anoro did not help much.  We will give her 2 more samples for 2 weeks worth to see if it helps, but if she has no improvement in breathing that I am not going to push her to use bronchodilators more.  Plan: Chronic respiratory failure with hypoxemia: It is really important that she use her oxygen all the time to keep the pulmonary hypertension from getting worse  Pulmonary hypertension: This is  due to your lung disease and your low oxygen value so you really need to use oxygen regularly  Idiopathic pulmonary fibrosis: This is the name of the condition I have diagnosed you with.  It means that there is scarring in your lungs.  As we discussed today this can progress so we need to prescribe a medicine to slow the progression. Start taking Ofev 100 mg twice a day no matter how you feel, will need to check liver function test when you come back in 6 weeks  COPD: Try Anoro sample again 1 puff daily x2 weeks call if this helps your breathing  Follow-up in 6 weeks or sooner if needed    Current Outpatient Medications:  .  aspirin 81 MG tablet, Take 81 mg by mouth daily.  , Disp: , Rfl:  .  clopidogrel (PLAVIX) 75  MG tablet, Take 1 tablet (75 mg total) by mouth daily., Disp: 30 tablet, Rfl: 6 .  Cyanocobalamin (B-12 PO), Take 1 tablet by mouth See admin instructions. Take 1 capsule every day x 7 days every other week , Disp: , Rfl:  .  dicyclomine (BENTYL) 10 MG capsule, Take 1 capsule (10 mg total) by mouth as needed for spasms., Disp: 30 capsule, Rfl: 3 .  ferrous sulfate 325 (65 FE) MG tablet, Take 325 mg by mouth daily., Disp: , Rfl:  .  Fexofenadine HCl (ALLEGRA ALLERGY PO), Take 1 tablet by mouth daily as needed (for allergies). , Disp: , Rfl:  .  FLUoxetine (PROZAC) 40 MG capsule, Take 40 mg by mouth daily., Disp: , Rfl:  .  folic acid (FOLVITE) 1 MG tablet, Take 1 tablet (1 mg total) by mouth daily., Disp: 90 tablet, Rfl: 4 .  furosemide (LASIX) 40 MG tablet, Take 40 mg by mouth daily., Disp: , Rfl:  .  gabapentin (NEURONTIN) 100 MG capsule, Take 1 capsule (100 mg total) by mouth 2 (two) times daily., Disp: 60 capsule, Rfl: 6 .  HYDROcodone-acetaminophen (NORCO) 10-325 MG tablet, Take 1 tablet by mouth every 6 (six) hours as needed for moderate [name]. , Disp: , Rfl:  .  metoprolol succinate (TOPROL-XL) 25 MG 24 hr tablet, Take 1 tablet (25 mg total) by mouth daily., Disp: 90 tablet,  Rfl: 3 .  Misc Natural Products (OSTEO BI-FLEX JOINT SHIELD PO), Take 1 capsule by mouth daily., Disp: , Rfl:  .  nitroGLYCERIN (NITROSTAT) 0.4 MG SL tablet, Place 1 tablet (0.4 mg total) under the tongue every 5 (five) minutes x 3 doses as needed. (Patient taking differently: Place 0.4 mg under the tongue every 5 (five) minutes x 3 doses as needed for chest [name]. ), Disp: 25 tablet, Rfl: 3 .  Omega-3 Fatty Acids (FISH OIL) 1200 MG CAPS, Take 1,200 mg by mouth daily. , Disp: , Rfl:  .  potassium chloride SA (K-DUR,KLOR-CON) 20 MEQ tablet, Take 2 tablets (40 mEq total) by mouth daily., Disp: 60 tablet, Rfl: 3 .  rosuvastatin (CRESTOR) 10 MG tablet, Take 2 tablets (20 mg total) by mouth daily., Disp: 30 tablet, Rfl: 3 .  sulfaSALAzine (AZULFIDINE) 500 MG tablet, Take 1 tablet (500 mg total) by mouth 2 (two) times daily., Disp: 60 tablet, Rfl: 6 .  varenicline (CHANTIX CONTINUING MONTH PAK) 1 MG tablet, Take 1 tablet (1 mg total) by mouth 2 (two) times daily., Disp: 60 tablet, Rfl: 1 .  umeclidinium-vilanterol (ANORO ELLIPTA) 62.5-25 MCG/INH AEPB, Inhale 1 puff into the lungs daily for 1 day., Disp: 14 each, Rfl: 0 .  umeclidinium-vilanterol (ANORO ELLIPTA) 62.5-25 MCG/INH AEPB, Inhale 1 puff into the lungs daily for 1 day., Disp: 2 each, Rfl: 0

## 2018-01-01 NOTE — Patient Instructions (Signed)
Chronic respiratory failure with hypoxemia: It is really important that she use her oxygen all the time to keep the pulmonary hypertension from getting worse  Pulmonary hypertension: This is due to your lung disease and your low oxygen value so you really need to use oxygen regularly  Idiopathic pulmonary fibrosis: This is the name of the condition I have diagnosed you with.  It means that there is scarring in your lungs.  As we discussed today this can progress so we need to prescribe a medicine to slow the progression. Start taking Ofev 100 mg twice a day no matter how you feel, will need to check liver function test when you come back in 6 weeks  COPD: Try Anoro sample again 1 puff daily x2 weeks call if this helps your breathing  Follow-up in 6 weeks or sooner if needed

## 2018-01-02 ENCOUNTER — Telehealth: Payer: Self-pay | Admitting: Pulmonary Disease

## 2018-01-02 NOTE — Telephone Encounter (Signed)
Forms for Ofev 128m bid have been completed and faxed to [employer] [facility] [employer] [facility]. Will route to BBurman Nievesfor f/u.

## 2018-01-05 ENCOUNTER — Ambulatory Visit (HOSPITAL_COMMUNITY)
Admission: RE | Admit: 2018-01-05 | Discharge: 2018-01-05 | Disposition: A | Discharge status: Home / Self Care | Payer: PPO | Source: Ambulatory Visit | Attending: Pulmonary Disease | Admitting: Pulmonary Disease

## 2018-01-05 DIAGNOSIS — E041 Nontoxic single thyroid nodule: Secondary | ICD-10-CM | POA: Diagnosis not present

## 2018-01-06 ENCOUNTER — Other Ambulatory Visit: Payer: Self-pay | Admitting: Pulmonary Disease

## 2018-01-06 DIAGNOSIS — E041 Nontoxic single thyroid nodule: Secondary | ICD-10-CM

## 2018-01-07 ENCOUNTER — Other Ambulatory Visit: Payer: PPO

## 2018-01-07 NOTE — Telephone Encounter (Signed)
Spoke with [employer] [facility] and they are missing these items for application. She states they sent a program application to the office already. Advised her to resend.   Program application Proff of income Medicaid red and blue A and B card

## 2018-01-07 NOTE — Telephone Encounter (Signed)
Spoke with pt, advised her that we needed her proof of income to send to [employer] [facility]. She states she has an appt on 01/22/18 and she will give this to the nurse when she comes. Dr Lake Bells has to sign on another spot on the program application at the bottom where the prescription is located. I have attached the Medicare Part A and B card to the sheet and will leave in BQ to sign folder. BJ can you hold for pt until she comes in on 5/30.   She also stated she is going to decline the thyroid biopsy because she has been through so much and wants to cancel the biopsy. FYI BQ

## 2018-01-07 NOTE — Telephone Encounter (Signed)
Patient called regarding Ofev. Patient states her copay is $1900 and she is unable to pay this. She would like to discuss alternative. She can be reached at [phone] -pr

## 2018-01-09 ENCOUNTER — Telehealth: Payer: Self-pay | Admitting: Pulmonary Disease

## 2018-01-09 ENCOUNTER — Other Ambulatory Visit (HOSPITAL_COMMUNITY): Payer: Self-pay

## 2018-01-09 MED ORDER — FUROSEMIDE 40 MG PO TABS: 40.0000 mg | ORAL_TABLET | Freq: Every day | ORAL | 3 refills | Status: DC

## 2018-01-09 NOTE — Telephone Encounter (Signed)
A lot of patients are having this same problem.  Per Maudie Mercury from BI/OFEV, she stated due to there being a lot of problems with [employer], [employer] is going to be going back to the patient assistance program for pt's OFEV.  At this time, what might need to be done by Dr. Lake Bells is to do an appeal letter for pt stating why pt needs the [facility] and also state in there the reason why pt needs assistance more than what they are being offered due to the payment being too high and pt not able to afford the cost of the med.  For further clarification about what possibly might need to be done, Maudie Mercury (our OFEV rep) can be contacted at [phone].  Routing to both Dr. Lake Bells and Burman Nieves

## 2018-01-09 NOTE — Telephone Encounter (Signed)
Not sure why all recent Ofev orders are going to [employer] [facility] their in house program for BI/Ofev. We aer suddenly facing a lot of this. Not sure if other charities not picking up ofev or they changed internal guidelnes or we are having a workflow error. I am not able to figure this out  Other options is for Dr Lake Bells to consider esbriet  Dr. Brand Males, M.D., [facility].C.P Pulmonary and Critical Care Medicine Staff Physician, [place] Director - Interstitial Lung Disease  Program  Pulmonary [place] at [place], [place], [address]  Pager: [phone], If no answer or between  15:00h - 7:00h: call [phone] Telephone: [phone]

## 2018-01-09 NOTE — Telephone Encounter (Signed)
Called and spoke with patient, she states that she spoke with someone yesterday in regards to the grant for the medication OFEV. She states that they offered her a grant for 9000 but it would only cover 4 months of the medication and she would not be able to continue to pay for it after that. She is wanting to know where she can go from there.    EP, do you or MR have an option for her. Thanks.

## 2018-01-09 NOTE — Telephone Encounter (Signed)
Emily please advise.  

## 2018-01-12 NOTE — Telephone Encounter (Signed)
Switch to Esbriet I'll fill out paperwork when I'm in office next

## 2018-01-13 NOTE — Telephone Encounter (Signed)
Filled out paperwork on our part. Given to BJT to place in BQ folder for him to fill out. Will route to BJT to follow up on.

## 2018-01-21 ENCOUNTER — Telehealth: Payer: Self-pay | Admitting: Pulmonary Disease

## 2018-01-21 NOTE — Telephone Encounter (Signed)
OK, no problem. I'm assuming this is just a problem for today.  If this is a recurring problem (more specifically, if she plans to only take it once a day moving forward) then we need to address that.

## 2018-01-21 NOTE — Telephone Encounter (Signed)
Spoke with pt, she would like BQ to know that she will not able to take her Ofev in the morning and the nurses advised her to let BQ know. She is going to the heart failure clinic for blood work and she has to be fasting. She just wanted to let you know. FYI BQ  Current Outpatient Medications on File Prior to Visit  Medication Sig Dispense Refill  . aspirin 81 MG tablet Take 81 mg by mouth daily.      . clopidogrel (PLAVIX) 75 MG tablet Take 1 tablet (75 mg total) by mouth daily. 30 tablet 6  . Cyanocobalamin (B-12 PO) Take 1 tablet by mouth See admin instructions. Take 1 capsule every day x 7 days every other week     . dicyclomine (BENTYL) 10 MG capsule Take 1 capsule (10 mg total) by mouth as needed for spasms. 30 capsule 3  . ferrous sulfate 325 (65 FE) MG tablet Take 325 mg by mouth daily.    Marland Kitchen Fexofenadine HCl (ALLEGRA ALLERGY PO) Take 1 tablet by mouth daily as needed (for allergies).     Marland Kitchen FLUoxetine (PROZAC) 40 MG capsule Take 40 mg by mouth daily.    . folic acid (FOLVITE) 1 MG tablet Take 1 tablet (1 mg total) by mouth daily. 90 tablet 4  . furosemide (LASIX) 40 MG tablet Take 1 tablet (40 mg total) by mouth daily. 30 tablet 3  . gabapentin (NEURONTIN) 100 MG capsule Take 1 capsule (100 mg total) by mouth 2 (two) times daily. 60 capsule 6  . HYDROcodone-acetaminophen (NORCO) 10-325 MG tablet Take 1 tablet by mouth every 6 (six) hours as needed for moderate pain.     . metoprolol succinate (TOPROL-XL) 25 MG 24 hr tablet Take 1 tablet (25 mg total) by mouth daily. 90 tablet 3  . Misc Natural Products (OSTEO BI-FLEX JOINT SHIELD PO) Take 1 capsule by mouth daily.    . nitroGLYCERIN (NITROSTAT) 0.4 MG SL tablet Place 1 tablet (0.4 mg total) under the tongue every 5 (five) minutes x 3 doses as needed. (Patient taking differently: Place 0.4 mg under the tongue every 5 (five) minutes x 3 doses as needed for chest pain. ) 25 tablet 3  . Omega-3 Fatty Acids (FISH OIL) 1200 MG CAPS Take 1,200 mg  by mouth daily.     . potassium chloride SA (K-DUR,KLOR-CON) 20 MEQ tablet Take 2 tablets (40 mEq total) by mouth daily. 60 tablet 3  . rosuvastatin (CRESTOR) 10 MG tablet Take 2 tablets (20 mg total) by mouth daily. 30 tablet 3  . sulfaSALAzine (AZULFIDINE) 500 MG tablet Take 1 tablet (500 mg total) by mouth 2 (two) times daily. 60 tablet 6  . varenicline (CHANTIX CONTINUING MONTH PAK) 1 MG tablet Take 1 tablet (1 mg total) by mouth 2 (two) times daily. 60 tablet 1   No current facility-administered medications on file prior to visit.    Allergies  Allergen Reactions  . Doxycycline Other (See Comments)    Poss. Photosensitivity  . Mercaptopurine Other (See Comments)    REACTION: pancreatitis  . Simvastatin Other (See Comments)    Severe leg aches  . Sulfasalazine   . Penicillins Rash and Other (See Comments)    Has patient had a PCN reaction causing immediate rash, facial/tongue/throat swelling, SOB or lightheadedness with hypotension: No Has patient had a PCN reaction causing severe rash involving mucus membranes or skin necrosis: No Has patient had a PCN reaction that required hospitalization: No Has  patient had a PCN reaction occurring within the last 10 years: No If all of the above answers are "NO", then may proceed with Cephalosporin use.   . Sulfa Antibiotics Rash

## 2018-01-21 NOTE — Telephone Encounter (Signed)
Called and spoke to patient and she plans to come and complete paperwork on 01/22/18.

## 2018-01-22 ENCOUNTER — Other Ambulatory Visit: Payer: Self-pay

## 2018-01-22 ENCOUNTER — Encounter: Payer: Self-pay | Admitting: Family

## 2018-01-22 ENCOUNTER — Ambulatory Visit (HOSPITAL_COMMUNITY)
Admission: RE | Admit: 2018-01-22 | Discharge: 2018-01-22 | Disposition: A | Discharge status: Home / Self Care | Payer: PPO | Source: Ambulatory Visit | Attending: Family | Admitting: Family

## 2018-01-22 ENCOUNTER — Ambulatory Visit (HOSPITAL_COMMUNITY)
Admission: RE | Admit: 2018-01-22 | Discharge: 2018-01-22 | Disposition: A | Discharge status: Home / Self Care | Payer: PPO | Source: Ambulatory Visit | Attending: Cardiology | Admitting: Cardiology

## 2018-01-22 ENCOUNTER — Ambulatory Visit: Payer: PPO | Admitting: Family

## 2018-01-22 VITALS — BP 140/71 | HR 55 | Temp 97.0°F | Resp 16 | Ht 65.0 in | Wt 199.0 lb

## 2018-01-22 DIAGNOSIS — Z8673 Personal history of transient ischemic attack (TIA), and cerebral infarction without residual deficits: Secondary | ICD-10-CM

## 2018-01-22 DIAGNOSIS — Z87891 Personal history of nicotine dependence: Secondary | ICD-10-CM | POA: Diagnosis not present

## 2018-01-22 DIAGNOSIS — E785 Hyperlipidemia, unspecified: Secondary | ICD-10-CM | POA: Insufficient documentation

## 2018-01-22 DIAGNOSIS — F17200 Nicotine dependence, unspecified, uncomplicated: Secondary | ICD-10-CM | POA: Insufficient documentation

## 2018-01-22 DIAGNOSIS — I6523 Occlusion and stenosis of bilateral carotid arteries: Secondary | ICD-10-CM

## 2018-01-22 DIAGNOSIS — I779 Disorder of arteries and arterioles, unspecified: Secondary | ICD-10-CM | POA: Diagnosis not present

## 2018-01-22 LAB — LIPID PANEL
Cholesterol: 131 mg/dL (ref 0–200)
HDL: 64 mg/dL (ref >40)
LDL CALC: 52 mg/dL (ref 0–99)
TRIGLYCERIDES: 73 mg/dL (ref <150)
Total CHOL/HDL Ratio: 2 RATIO
VLDL: 15 mg/dL (ref 0–40)

## 2018-01-22 LAB — HEPATIC FUNCTION PANEL
ALT: 22 U/L (ref 14–54)
AST: 19 U/L (ref 15–41)
Albumin: 3.3 g/dL — ABNORMAL LOW (ref 3.5–5.0)
Alkaline Phosphatase: 74 U/L (ref 38–126)
BILIRUBIN DIRECT: 0.2 mg/dL (ref 0.1–0.5)
BILIRUBIN INDIRECT: 0.8 mg/dL (ref 0.3–0.9)
BILIRUBIN TOTAL: 1 mg/dL (ref 0.3–1.2)
Total Protein: 6.5 g/dL (ref 6.5–8.1)

## 2018-01-22 NOTE — Telephone Encounter (Signed)
I have this paperwork completed by patient however patient is now taking OFEV.  BQ do you want me to continue with this?  I currently have this in my folder but will need MD signature if continuing to pursue this.

## 2018-01-22 NOTE — Telephone Encounter (Signed)
Yes please

## 2018-01-22 NOTE — Progress Notes (Signed)
Chief Complaint: Follow up Extracranial Carotid Artery Stenosis   History of Present Illness  Shelley [name] is a 77 y.o. female whom Dr. Oneida Alar has been monitoring with a history of carotid artery stenosis and no history of carotid intervention.  She returns for surveillance.  The patient does have a history of MI and 2 cardiac stents placed in 2004 and TIA symptoms many years ago manifested as transient right arm weakness, but denies history of stroke.  She has had no subsequent stroke or TIA symptoms.  The patient denies a history of amaurosis fugax or monocular blindness, unilateral facial drooping, or receptive or expressive aphasia. ..  Shedenies current hemiplegia.  Patient denies non-healing wounds.   She has fallen a few times as her legs seem to give way, states this is due to her knee and back issues.  She lives in a 3 story house and climbs stairs several times/day, but her ambulation is otherwise limited by arthritis and pain from lumbar spine problems: DDD, HNP, arthritis, left leg sciatica.   Shewas receiving physical therapy due to frequent falls.  Dr. Oneida Alar evaluated pt on 12-18-17. At that time there were multifactorial reasons for her lower extremity symptoms.  Her symptoms that occur while laying in bed of buttock and thigh pain is not classically related to arterial occlusive disease.  Certainly the weakness that she experiences in her legs could be related to arterial occlusive disease but she does not really describe classic claudication.  She is also somewhat limited in her walking distance by underlying pulmonary dysfunction. Dr. Oneida Alar believes the coolness numbness and tingling that she has in both lower extremities is secondary to peripheral neuropathy and she has multiple possible reasons for this.  These include her vitamin B12 deficiency in the past as well as peripheral arterial disease and previous back and neurologic issues.  She does have  reasonable perfusion to both lower extremities and she certainly was not at risk of limb loss at that time.  Dr. Oneida Alar started her on Neurontin 100 mg twice a day today.  She hds some unsteadiness in her gait so he started this at a low dose, and will leave it at the discretion of her primary care physician whether or not they wish to titrate this up or stop it in the future if she is not getting any relief of symptoms. Peripheral arterial disease overall very mild with adequate perfusion to both lower extremities. At that visit Dr. Oneida Alar discussed with the patient the possibility of an aortogram bilateral lower extremity runoff possible intervention.  Certainly we would probably be able to do a percutaneous option to treat both superficial femoral artery narrowings.  However, he was not sure that was going to improve her symptoms overall; it certainly would not be curative of her symptoms.  She will think about whether or not she wishes to proceed with this. Dr. Oneida Alar discussed with pt there would be some risk involved but it would be fairly low risk overall. Right buttock thigh leg pain most likely secondary to neurologic etiology.  The patient was to decide if she wishes to pursue further follow-up with Dr. Ellene Route. She was to return to see our nurse practitioner in November 2019.  At that point we will repeat her ABIs as well as her carotid duplex scan to make sure she has not had any significant decline.    Pt states the gabapentin has helped a bit with the pain/tingling/weakness in her legs, but states  she thinks the increased dose of the statin has increased the weakness in her legs; she is working with Dr. Aundra Dubin re this.   She had a cardiac cath in March 2019, pt states 70% stenosis was found in one of her vessels, no intervention since she was asymptomatic. She was retaining fluid, she is taking a diuretic.  She was also recently diagnosed with pulmonary hypertension.    Diabetic: No,  last A1C result on file was 5.6 on 08-12-16(review of records) Tobacco use: former smoker (started in high school, quit several times, last cigarette was in March 2019)  Pt meds include:  Statin :yes Betablocker: Yes  ASA: Yes  Other anticoagulants/antiplatelets: Plavix   Past Medical History:  Diagnosis Date  . Anxiety   . Carotid artery disease ([place])    29-52% RICA and LICA 8/41 - Dr. Oneida Alar  . Cataract   . COPD (chronic obstructive pulmonary disease) ([place])   . Coronary atherosclerosis of native coronary artery    BMS to RCA 2004  . Depression   . DVT (deep venous thrombosis) ([place])   . Essential hypertension, benign   . Fracture Left Great Toe  . Hepatic steatosis   . History of melanoma   . History of oral aphthous ulcers   . History of Salmonella gastroenteritis   . Hx of adenomatous colonic polyps   . Mixed hyperlipidemia   . Myocardial infarction ([place])    IMI 10/04  . Osteoarthritis   . Osteoporosis   . Pancreatitis   . Rectal bleeding   . Regional enteritis of large intestine ([place])   . Sinus polyp   . TIA (transient ischemic attack)   . Tubular adenoma of colon 08/2012    Social History Social History   Tobacco Use  . Smoking status: Light Tobacco Smoker    Packs/day: 0.25    Years: 40.00    Pack years: 10.00    Types: Cigarettes    Start date: 06/02/1957  . Smokeless tobacco: Never Used  . Tobacco comment: trying to quit 1/2 pack day - so much stress in her lift  Substance Use Topics  . Alcohol use: No    Alcohol/week: 0.0 oz  . Drug use: No    Family History Family History  Problem Relation Age of Onset  . Diabetes type II Mother   . Heart attack Mother 53       questionable  . Uterine cancer Mother   . Heart disease Mother        before age 22  . Varicose Veins Mother   . Other Brother        PVD and hx DVT  . Deep vein thrombosis Brother   . Breast cancer Sister   . Hyperlipidemia Sister   . Hypertension Sister   . Colon  cancer Neg Hx     Surgical History Past Surgical History:  Procedure Laterality Date  . CATARACT EXTRACTION Bilateral   . Melanoma resection  1996  . RIGHT/LEFT HEART CATH AND CORONARY ANGIOGRAPHY N/A 10/30/2017   Procedure: RIGHT/LEFT HEART CATH AND CORONARY ANGIOGRAPHY;  Surgeon: Larey Dresser, MD;  Location: [facility];  Service: Cardiovascular;  Laterality: N/A;  . TOTAL KNEE ARTHROPLASTY Right Feb 2012    Allergies  Allergen Reactions  . Doxycycline Other (See Comments)    Poss. Photosensitivity  . Mercaptopurine Other (See Comments)    REACTION: pancreatitis  . Simvastatin Other (See Comments)    Severe leg aches  . Sulfasalazine   .  Penicillins Rash and Other (See Comments)    Has patient had a PCN reaction causing immediate rash, facial/tongue/throat swelling, SOB or lightheadedness with hypotension: No Has patient had a PCN reaction causing severe rash involving mucus membranes or skin necrosis: No Has patient had a PCN reaction that required hospitalization: No Has patient had a PCN reaction occurring within the last 10 years: No If all of the above answers are "NO", then may proceed with Cephalosporin use.   . Sulfa Antibiotics Rash    Current Outpatient Medications  Medication Sig Dispense Refill  . aspirin 81 MG tablet Take 81 mg by mouth daily.      . clopidogrel (PLAVIX) 75 MG tablet Take 1 tablet (75 mg total) by mouth daily. 30 tablet 6  . Cyanocobalamin (B-12 PO) Take 1 tablet by mouth See admin instructions. Take 1 capsule every day x 7 days every other week     . dicyclomine (BENTYL) 10 MG capsule Take 1 capsule (10 mg total) by mouth as needed for spasms. 30 capsule 3  . ferrous sulfate 325 (65 FE) MG tablet Take 325 mg by mouth daily.    Marland Kitchen Fexofenadine HCl (ALLEGRA ALLERGY PO) Take 1 tablet by mouth daily as needed (for allergies).     Marland Kitchen FLUoxetine (PROZAC) 40 MG capsule Take 40 mg by mouth daily.    . folic acid (FOLVITE) 1 MG tablet Take 1  tablet (1 mg total) by mouth daily. 90 tablet 4  . furosemide (LASIX) 40 MG tablet Take 1 tablet (40 mg total) by mouth daily. 30 tablet 3  . gabapentin (NEURONTIN) 100 MG capsule Take 1 capsule (100 mg total) by mouth 2 (two) times daily. 60 capsule 6  . HYDROcodone-acetaminophen (NORCO) 10-325 MG tablet Take 1 tablet by mouth every 6 (six) hours as needed for moderate pain.     . metoprolol succinate (TOPROL-XL) 25 MG 24 hr tablet Take 1 tablet (25 mg total) by mouth daily. 90 tablet 3  . Misc Natural Products (OSTEO BI-FLEX JOINT SHIELD PO) Take 1 capsule by mouth daily.    . Nintedanib (OFEV) 100 MG CAPS Take 100 mg by mouth 2 (two) times daily.    . nitroGLYCERIN (NITROSTAT) 0.4 MG SL tablet Place 1 tablet (0.4 mg total) under the tongue every 5 (five) minutes x 3 doses as needed. (Patient taking differently: Place 0.4 mg under the tongue every 5 (five) minutes x 3 doses as needed for chest pain. ) 25 tablet 3  . Omega-3 Fatty Acids (FISH OIL) 1200 MG CAPS Take 1,200 mg by mouth daily.     . potassium chloride SA (K-DUR,KLOR-CON) 20 MEQ tablet Take 2 tablets (40 mEq total) by mouth daily. 60 tablet 3  . rosuvastatin (CRESTOR) 10 MG tablet Take 2 tablets (20 mg total) by mouth daily. (Patient taking differently: Take 20 mg by mouth daily. ) 30 tablet 3  . sulfaSALAzine (AZULFIDINE) 500 MG tablet Take 1 tablet (500 mg total) by mouth 2 (two) times daily. 60 tablet 6  . varenicline (CHANTIX CONTINUING MONTH PAK) 1 MG tablet Take 1 tablet (1 mg total) by mouth 2 (two) times daily. (Patient not taking: Reported on 01/22/2018) 60 tablet 1   No current facility-administered medications for this visit.     Review of Systems : See HPI for pertinent positives and negatives.  Physical Examination  Vitals:   01/22/18 1334 01/22/18 1339 01/22/18 1342  BP: 131/65 (!) 149/67 140/71  Pulse: (!) 55 (!) 55 (!) 55  Resp: 16    Temp: (!) 97 F (36.1 C)    TempSrc: Oral    SpO2: 97%    Weight: 199 lb  (90.3 kg)    Height: _0  (1.651 m)     Body mass index is 33.12 kg/m.  General: WDWN obese female in NAD. GAIT:uses a rolling walker, antalgic.  Eyes: PERRLA Pulmonary:Respirations are non labored,adequate air movement in all fields, + rales in both bases, no rhonchi, or wheezes. Cardiac: regular rhythm, no detected murmur.  VASCULAR EXAM Carotid Bruits Left Right   Negative  positive   Abdominal aortic pulse is not palpable.  Radial pulses are 2+ palpable and equal.   LE Pulses  LEFT  RIGHT   POPLITEAL  not palpable  not palpable  POSTERIOR TIBIAL  not palpable  not palpable   DORSALIS PEDIS ANTERIOR TIBIAL  Not palpable  Not palpable    Gastrointestinal: soft, nontender, BS WNL, no r/g, no palpated masses.  Musculoskeletal: No muscle atrophy/wasting. M/S5/5 throughout except left lower extremity is 4/5, Extremities without ischemic changes. Trace non pitting edema right ankle.  Skin: No rashes, no cellulitis, no ulcers.   Neurologic: A&O X 3; appropriate affect, speech is normal, CN 2-12 intact, Pain and light touch intact in extremities, Motor exam as listed above. Psychiatric: Normal thought content, mood appropriate to clinical situation.    Assessment: ANNEKE CUNDY is a 77 y.o. female who has a history of a remote TIA, none subsequently.   Her atherosclerotic risk factors include former smoking (for about 63 years), CAD, COPD, and obesity.  She has been elevating her legs and has trace lright ankle edema.   DATA Carotid Duplex (01-22-18): 60-79% right ICA stenosis and 40-59% left ICA stenosis. Bilateral vertebral artery flow is antegrade. Bilateral subclavian artery waveforms are normal. No significant change compared to the examson 01-04-16,07-11-16, 01-09-17, and 07-21-17.   Plan: Follow-up in 59monthwith Carotid Duplex scan and ABI's.  Daily seated leg exercises as  discussed and demonstrated at a previous visit.  The patient was counseled re smoking cessation and given several free resources re smoking cessation.  I discussed in depth with the patient the nature of atherosclerosis, and emphasized the importance of maximal medical management including strict control of blood pressure, blood glucose, and lipid levels, obtaining regular exercise, and continued cessation of smoking.  The patient is aware that without maximal medical management the underlying atherosclerotic disease process will progress, limiting the benefit of any interventions. The patient was given information about stroke prevention and what symptoms should prompt the patient to seek immediate medical care. Thank you for allowing [facility] participate in this patient's care.  SClemon Chambers RN, MSN, FNP-C [facility]ertonOffice: [phone] Clinic Physician: FOneida Alar 01/22/18 1:47 PM

## 2018-01-22 NOTE — Progress Notes (Signed)
Vitals:   01/22/18 1334 01/22/18 1339  BP: 131/65 (!) 149/67  Pulse: (!) 55 (!) 55  Resp: 16   Temp: (!) 97 F (36.1 C)   TempSrc: Oral   SpO2: 97%   Weight: 199 lb (90.3 kg)   Height: _0  (1.651 m)

## 2018-01-22 NOTE — Telephone Encounter (Signed)
Patient's daughter came to get paperwork and stated will bring the financial portion back at next appointment 6/20. Explained that the [employer] paperwork will assist with the cost. Daughter stated they need that because patient has started taking OFEV and that it's expensive. Will keep remaining paperwork in my holding folder.

## 2018-01-22 NOTE — Patient Instructions (Addendum)
    Stroke Prevention Some health problems and behaviors may make it more likely for you to have a stroke. Below are ways to lessen your risk of having a stroke.  Be active for at least 30 minutes on most or all days.  Do not smoke. Try not to be around others who smoke.  Do not drink too much alcohol. ? Do not have more than 2 drinks a day if you are a man. ? Do not have more than 1 drink a day if you are a woman and are not pregnant.  Eat healthy foods, such as fruits and vegetables. If you were put on a specific diet, follow the diet as told.  Keep your cholesterol levels under control through diet and medicines. Look for foods that are low in saturated fat, trans fat, cholesterol, and are high in fiber.  If you have diabetes, follow all diet plans and take your medicine as told.  Ask your doctor if you need treatment to lower your blood pressure. If you have high blood pressure (hypertension), follow all diet plans and take your medicine as told by your doctor.  If you are 88-10 years old, have your blood pressure checked every 3-5 years. If you are age 53 or older, have your blood pressure checked every year.  Keep a healthy weight. Eat foods that are low in calories, salt, saturated fat, trans fat, and cholesterol.  Do not take drugs.  Avoid birth control pills, if this applies. Talk to your doctor about the risks of taking birth control pills.  Talk to your doctor if you have sleep problems (sleep apnea).  Take all medicine as told by your doctor. ? You may be told to take aspirin or blood thinner medicine. Take this medicine as told by your doctor. ? Understand your medicine instructions.  Make sure any other conditions you have are being taken care of.  Get help right away if:  You suddenly lose feeling (you feel numb) or have weakness in your face, arm, or leg.  Your face or eyelid hangs down to one side.  You suddenly feel confused.  You have trouble talking  (aphasia) or understanding what people are saying.  You suddenly have trouble seeing in one or both eyes.  You suddenly have trouble walking.  You are dizzy.  You lose your balance or your movements are clumsy (uncoordinated).  You suddenly have a very bad headache and you do not know the cause.  You have new chest pain.  Your heart feels like it is fluttering or skipping a beat (irregular heartbeat). Do not wait to see if the symptoms above go away. Get help right away. Call your local emergency services (911 in [place] Do not drive yourself to the hospital. This information is not intended to replace advice given to you by your health care provider. Make sure you discuss any questions you have with your health care provider. Document Released: 02/11/2012 Document Revised: 01/18/2016 Document Reviewed: 02/12/2013 Elsevier Interactive Patient Education  [employer].

## 2018-01-23 ENCOUNTER — Encounter (HOSPITAL_COMMUNITY): Payer: Self-pay

## 2018-01-23 NOTE — Telephone Encounter (Signed)
Patient calling to discuss Ofev.  CB is [phone].

## 2018-01-23 NOTE — Telephone Encounter (Signed)
Talked with patient who stated that she received a grant that is covering her OFEV. Nothing further needed.

## 2018-01-23 NOTE — Telephone Encounter (Signed)
Followed up with Dr. Lake Bells and patient does not need the paperwork for the esbriet because she is going to stay on the ofev now that it has been approved. Nothing further is needed.

## 2018-01-23 NOTE — Telephone Encounter (Signed)
Called and spoke to patient. Patient stated that she has received a grant that is covering her OFEV.   BQ  I have placed pt's completed paperwork for Esbriet in your to do folder.

## 2018-01-23 NOTE — Telephone Encounter (Signed)
Will route to Shelley [name] for follow up.

## 2018-01-23 NOTE — Telephone Encounter (Signed)
Guessing paperwork is for Ofev? Happy to sign when I'm back in office

## 2018-01-26 ENCOUNTER — Telehealth: Payer: Self-pay | Admitting: Cardiology

## 2018-01-26 MED ORDER — CLOPIDOGREL BISULFATE 75 MG PO TABS: 75.0000 mg | ORAL_TABLET | Freq: Every day | ORAL | 6 refills | Status: DC

## 2018-01-26 NOTE — Telephone Encounter (Signed)
*  STAT* If patient is at the pharmacy, call can be transferred to refill team.   1. Which medications need to be refilled?  clopidogrel (PLAVIX) 75 MG tablet    2. Which pharmacy/location (including street and city if local pharmacy) is medication to be sent to? [facility]   3. Do they need a 30 day or 90 day supply?

## 2018-01-26 NOTE — Telephone Encounter (Signed)
Done.

## 2018-02-01 DIAGNOSIS — I5032 Chronic diastolic (congestive) heart failure: Secondary | ICD-10-CM | POA: Diagnosis not present

## 2018-02-01 DIAGNOSIS — R0602 Shortness of breath: Secondary | ICD-10-CM | POA: Diagnosis not present

## 2018-02-01 DIAGNOSIS — R2689 Other abnormalities of gait and mobility: Secondary | ICD-10-CM | POA: Diagnosis not present

## 2018-02-04 ENCOUNTER — Other Ambulatory Visit (HOSPITAL_COMMUNITY): Payer: Self-pay | Admitting: *Deleted

## 2018-02-04 MED ORDER — ROSUVASTATIN CALCIUM 10 MG PO TABS: 10.0000 mg | ORAL_TABLET | Freq: Every day | ORAL | 3 refills | Status: DC

## 2018-02-05 ENCOUNTER — Other Ambulatory Visit: Payer: Self-pay

## 2018-02-05 DIAGNOSIS — I779 Disorder of arteries and arterioles, unspecified: Secondary | ICD-10-CM

## 2018-02-05 DIAGNOSIS — I6523 Occlusion and stenosis of bilateral carotid arteries: Secondary | ICD-10-CM

## 2018-02-11 DIAGNOSIS — C44519 Basal cell carcinoma of skin of other part of trunk: Secondary | ICD-10-CM | POA: Diagnosis not present

## 2018-02-11 DIAGNOSIS — D225 Melanocytic nevi of trunk: Secondary | ICD-10-CM | POA: Diagnosis not present

## 2018-02-12 ENCOUNTER — Other Ambulatory Visit (INDEPENDENT_AMBULATORY_CARE_PROVIDER_SITE_OTHER): Payer: PPO

## 2018-02-12 ENCOUNTER — Ambulatory Visit (INDEPENDENT_AMBULATORY_CARE_PROVIDER_SITE_OTHER)
Admission: RE | Admit: 2018-02-12 | Discharge: 2018-02-12 | Disposition: A | Discharge status: Home / Self Care | Payer: PPO | Source: Ambulatory Visit | Attending: Acute Care | Admitting: Acute Care

## 2018-02-12 ENCOUNTER — Encounter: Payer: Self-pay | Admitting: Acute Care

## 2018-02-12 ENCOUNTER — Ambulatory Visit: Payer: PPO | Admitting: Acute Care

## 2018-02-12 VITALS — BP 122/70 | HR 62 | Ht 65.0 in | Wt 218.0 lb

## 2018-02-12 DIAGNOSIS — E440 Moderate protein-calorie malnutrition: Secondary | ICD-10-CM | POA: Diagnosis not present

## 2018-02-12 DIAGNOSIS — I270 Primary pulmonary hypertension: Secondary | ICD-10-CM

## 2018-02-12 DIAGNOSIS — G4733 Obstructive sleep apnea (adult) (pediatric): Secondary | ICD-10-CM | POA: Diagnosis not present

## 2018-02-12 DIAGNOSIS — F17200 Nicotine dependence, unspecified, uncomplicated: Secondary | ICD-10-CM

## 2018-02-12 DIAGNOSIS — J84112 Idiopathic pulmonary fibrosis: Secondary | ICD-10-CM

## 2018-02-12 DIAGNOSIS — R0683 Snoring: Secondary | ICD-10-CM | POA: Diagnosis not present

## 2018-02-12 DIAGNOSIS — I2721 Secondary pulmonary arterial hypertension: Secondary | ICD-10-CM | POA: Insufficient documentation

## 2018-02-12 DIAGNOSIS — J9611 Chronic respiratory failure with hypoxia: Secondary | ICD-10-CM | POA: Insufficient documentation

## 2018-02-12 DIAGNOSIS — J449 Chronic obstructive pulmonary disease, unspecified: Secondary | ICD-10-CM

## 2018-02-12 DIAGNOSIS — R682 Dry mouth, unspecified: Secondary | ICD-10-CM | POA: Insufficient documentation

## 2018-02-12 DIAGNOSIS — E041 Nontoxic single thyroid nodule: Secondary | ICD-10-CM | POA: Insufficient documentation

## 2018-02-12 DIAGNOSIS — J8410 Pulmonary fibrosis, unspecified: Secondary | ICD-10-CM | POA: Diagnosis not present

## 2018-02-12 DIAGNOSIS — E46 Unspecified protein-calorie malnutrition: Secondary | ICD-10-CM | POA: Insufficient documentation

## 2018-02-12 LAB — COMPREHENSIVE METABOLIC PANEL
ALBUMIN: 3.9 g/dL (ref 3.5–5.2)
ALT: 43 U/L — ABNORMAL HIGH (ref 0–35)
AST: 35 U/L (ref 0–37)
Alkaline Phosphatase: 78 U/L (ref 39–117)
BUN: 16 mg/dL (ref 6–23)
CALCIUM: 9.3 mg/dL (ref 8.4–10.5)
CHLORIDE: 109 meq/L (ref 96–112)
CO2: 24 mEq/L (ref 19–32)
Creatinine, Ser: 0.77 mg/dL (ref 0.40–1.20)
GFR: 76.92 mL/min (ref >60.00)
Glucose, Bld: 104 mg/dL — ABNORMAL HIGH (ref 70–99)
POTASSIUM: 4.2 meq/L (ref 3.5–5.1)
Sodium: 142 mEq/L (ref 135–145)
TOTAL PROTEIN: 6.6 g/dL (ref 6.0–8.3)
Total Bilirubin: 1 mg/dL (ref 0.2–1.2)

## 2018-02-12 MED ORDER — BIOTENE DRY MOUTH MT LIQD: 15.0000 mL | OROMUCOSAL | 2 refills | Status: DC | PRN

## 2018-02-12 MED ORDER — BUDESONIDE-FORMOTEROL FUMARATE 160-4.5 MCG/ACT IN AERO: 2.0000 | INHALATION_SPRAY | Freq: Two times a day (BID) | RESPIRATORY_TRACT | 0 refills | Status: DC

## 2018-02-12 NOTE — Assessment & Plan Note (Addendum)
Patient currently not compliant with Lasix Weight increase of 19 pounds in 5 to 6 weeks Patient is noncompliant with wearing oxygen at all times Plan CXR today We will do labs today.( CMET) It is imperative that you use your oxygen at all times. Saturation goals are 88-92% We do not want you to drop you saturation below 88%. Please wear your oxygen at 2 L nasal cannula when you are using continuous oxygen When you are wearing the pulsed oxygen you need to wear your oxygen at 3 to 4 L nasal cannula to maintain saturations  88% - 92 % We will walk you on a smaller POC which is pulsed oxygen. Take your Lasix every day without fail. We may need to increase dose if you continue to retain fluid. Limit your salt intake.This makes your fluid retention worse Weigh yourself every day. Referral for pulmonary rehab to work on physical conditioning Call cardiology for  increase in weight despite compliance with Lasix Follow-up with cardiology next week as is scheduled

## 2018-02-12 NOTE — Assessment & Plan Note (Signed)
Patient has been compliant with Ofev  At  100 mg twice daily Patient claims she has poor appetite since initiating Patient does not have any other GI complications Plan LFTs today for therapeutic drug monitoring Continue  Ofev 100 mg twice daily Follow-up in 4 weeks to monitor tolerance LFTs at follow-up Consider increasing to 150 mg twice daily as she is tolerating well

## 2018-02-12 NOTE — Progress Notes (Signed)
History of Present Illness Shelley [name] is a 77 y.o. female with COPD, PAH, and CAD. She is followed by Dr. Lake Bells  Synopsis: Referred in March 2019 by Dr. Aundra Dubin for COPD.  She has pulmonary hypertension and CAD.  She had cardiac stents placed in 2004 by Dr. Lia Foyer.  She developed leg swelling in January 2019 and was found to have pulmonary hypertension.  She also has a history of Crohn's disease. 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  She quit smoking in 2019.     02/12/2018  Follow up after 5/9/visit with Dr. Lake Bells.See plan and discussion below:  Shelley [name] has IPF.  Blood work did not show evidence of an underlying connective tissue disease, and a CT scan of her chest by Dr. Anastasia Pall read of these images is consistent with usual interstitial pneumonitis.  He is  hopeful that the fact that she has underlying Crohn's disease may actually be a good prognostic sign.  Regardless he  feels  it safest to treat her as if she has IPF.  His plan is  to use an attenuated dose of Ofev because of her history of Crohn's and the high likelihood of GI complications.  She only has moderate airflow obstruction so it is not too surprising that the Anoro did not help much.  We will give her 2 more samples for 2 weeks worth to see if it helps, but if she has no improvement in breathing that I am not going to push her to use bronchodilators more.  Plan: Chronic respiratory failure with hypoxemia: It is really important that she use her oxygen all the time to keep the pulmonary hypertension from progressing  Pulmonary hypertension: This is due to your lung disease and your low oxygen value so you really need to use oxygen regularly  Idiopathic pulmonary fibrosis: This is the name of the condition you  have been diagnosed  with.  It means that there is scarring in your lungs.  This can progress so we need to prescribe a medicine to slow the progression. Start taking Ofev 100 mg twice a day no  matter how you feel, will need to check liver function test when you come back in 6 weeks  COPD: Try Anoro sample again 1 puff daily x 2 weeks call and let us know  if this helps your breathing.  Pt. Presents for follow up.She states she has been compliant with her Ofev. She states she does not have an appetite on the Ofev.  Per her daughter she is making very poor food choices.  She has not had any other  GI issues despite her Chrohn's disease. She has been using the Anoro,and she feels it makes no difference to her breathing. She wants to try another inhaler. Per her daughter,  today is the first day she has worn her oxygen in public.she does not feel she is wearing the oxygen as often as she should be.  She has not been compliant with her lasix.  Her weight is up 19 pounds by our scales.  She confirms that she has not taken her Lasix for at least the last 2 days.  She states it is not convenient to have to use the bathroom frequently.  Additionally she is not weighing herself.  She is complaining of a dry mouth.  Her daughter states that her mom has been snoring and gurgling with sleep.  She is concerned that she may have sleep apnea.  She did  get a call regarding biopsy of the thyroid nodule noted on ultrasound.  She deferred at the time due to the fact she had symmetrical along with her new diagnosis of ILD.  We are going to re-refer to endocrinology for follow-up of that nodule.  She has significant lower extremity edema.  She states that she feels abdominal bloating and fullness.  She has not been regulating her sodium intake, and due to the fact she has lack of appetite she has been drinking more than eating.  She continues to experience breathlessness with minimal exertion.  She denies fever, chest [name], orthopnea, or hemoptysis.     Test Results: US Thyroid 12/2017 The approximately 2.0 cm nodule within the right lobe of the thyroid, likely correlating with the nodule seen on preceding  chest CT, meets imaging criteria to recommend percutaneous sampling as clinically indicated. Pt refused biopsy when called  1 month ago to set up procedure due to complexity of current medical issues   Chest imaging: CTA chest (1/19): No PE, air trapping noted, some centrilobular emphysema noted, some paraseptal emphysema noted, fibrotic change in the periphery and appears to be worse in the bases V/Q scan (3/19): No evidence for chronic PE. HRCT 10/2017: images independently reviewed: peripheral based recitulation, interlobular thickening and honeycombing worse in the bases, some traction bronchiectasis, some areas of preserved lung noted. Per radiology probably UIP, possible fibrotic NSIP; by [facility] read this is consistent with UIP; paratracheal adenopathy, thyroid nodule 2 cm, cardiomegally   PFT: - PFTs (3/19): Ratio 63%, FEV1 1.28 L 61% predicted, total lung capacity 4.2 L 80% predicted, DLCO 8.10 31% predicted  Labs: 10/2017: ANA neg, SCL 70 neg, Anti-Jo-1 neg, Centromere neg, SSA/SSB neg, RF neg, HP panel neg  Path:  Echo: Echo (1/19) with EF 55-60%, mild LVH, mild MR, moderate RV dilation with moderately decreased systolic function, moderate TR, PASP 74 mmHg.   Heart Catheterization: - RHC (3/19): mean RA 2, PA 54/16 mean 28, mean PCWP 8, CI 2.55, PVR 3.9 WU.     CBC Latest Ref Rng & Units 10/30/2017 11/09/2014 08/10/2013  WBC 4.0 - 10.5 K/uL 5.9 6.6 7.2  Hemoglobin 12.0 - 15.0 g/dL 15.3(H) 13.1 12.5  Hematocrit 36.0 - 46.0 % 47.0(H) 38.5 37.6  Platelets 150 - 400 K/uL 206 236.0 235.0    BMP Latest Ref Rng & Units 11/19/2017 10/30/2017 10/29/2017  Glucose 65 - 99 mg/dL 92 102(H) 112(H)  BUN 6 - 20 mg/dL 12 20 25(H)  Creatinine 0.44 - 1.00 mg/dL 0.71 0.87 0.93  Sodium 135 - 145 mmol/L 138 138 137  Potassium 3.5 - 5.1 mmol/L 4.0 4.6 5.2(H)  Chloride 101 - 111 mmol/L 106 102 100(L)  CO2 22 - 32 mmol/L _0 Calcium 8.9 - 10.3 mg/dL 9.5 9.6 9.9    BNP No  results found for: BNP  ProBNP No results found for: PROBNP  PFT    Component Value Date/Time   FEV1PRE 1.15 10/29/2017 0824   FEV1POST 1.28 10/29/2017 0824   FVCPRE 1.86 10/29/2017 0824   FVCPOST 2.01 10/29/2017 0824   TLC 4.19 10/29/2017 0824   DLCOUNC 8.10 10/29/2017 0824   PREFEV1FVCRT 62 10/29/2017 0824   PSTFEV1FVCRT 63 10/29/2017 0824    No results found.   Past medical hx Past Medical History:  Diagnosis Date  . Anxiety   . Carotid artery disease ([place])    66-59% RICA and LICA 9/35 - Dr. Oneida Alar  . Cataract   . COPD (chronic obstructive  pulmonary disease) ([place])   . Coronary atherosclerosis of native coronary artery    BMS to RCA 2004  . Depression   . DVT (deep venous thrombosis) ([place])   . Essential hypertension, benign   . Fracture Left Great Toe  . Hepatic steatosis   . History of melanoma   . History of oral aphthous ulcers   . History of Salmonella gastroenteritis   . Hx of adenomatous colonic polyps   . Mixed hyperlipidemia   . Myocardial infarction ([place])    IMI 10/04  . Osteoarthritis   . Osteoporosis   . Pancreatitis   . Rectal bleeding   . Regional enteritis of large intestine ([place])   . Sinus polyp   . TIA (transient ischemic attack)   . Tubular adenoma of colon 08/2012     Social History   Tobacco Use  . Smoking status: Former Smoker    Packs/day: 0.25    Years: 40.00    Pack years: 10.00    Types: Cigarettes    Start date: 06/02/1957    Last attempt to quit: 09/2017    Years since quitting: 0.3  . Smokeless tobacco: Never Used  Substance Use Topics  . Alcohol use: No    Alcohol/week: 0.0 oz  . Drug use: No    Ms.Stills reports that she quit smoking about 4 months ago. Her smoking use included cigarettes. She started smoking about 60 years ago. She has a 10.00 pack-year smoking history. She has never used smokeless tobacco. She reports that she does not drink alcohol or use drugs.  Tobacco Cessation: I have spent 3 minutes  counseling patient on smoking cessation this visit.  Past surgical hx, Family hx, Social hx all reviewed.  Current Outpatient Medications on File Prior to Visit  Medication Sig  . aspirin 81 MG tablet Take 81 mg by mouth daily.    . clopidogrel (PLAVIX) 75 MG tablet Take 1 tablet (75 mg total) by mouth daily.  . Cyanocobalamin (B-12 PO) Take 1 tablet by mouth See admin instructions. Take 1 capsule every day x 7 days every other week   . dicyclomine (BENTYL) 10 MG capsule Take 1 capsule (10 mg total) by mouth as needed for spasms.  . ferrous sulfate 325 (65 FE) MG tablet Take 325 mg by mouth daily.  Marland Kitchen Fexofenadine HCl (ALLEGRA ALLERGY PO) Take 1 tablet by mouth daily as needed (for allergies).   Marland Kitchen FLUoxetine (PROZAC) 40 MG capsule Take 40 mg by mouth daily.  . folic acid (FOLVITE) 1 MG tablet Take 1 tablet (1 mg total) by mouth daily.  . furosemide (LASIX) 40 MG tablet Take 1 tablet (40 mg total) by mouth daily.  Marland Kitchen gabapentin (NEURONTIN) 100 MG capsule Take 1 capsule (100 mg total) by mouth 2 (two) times daily.  Marland Kitchen HYDROcodone-acetaminophen (NORCO) 10-325 MG tablet Take 1 tablet by mouth every 6 (six) hours as needed for moderate [name].   . metoprolol succinate (TOPROL-XL) 25 MG 24 hr tablet Take 1 tablet (25 mg total) by mouth daily.  . Misc Natural Products (OSTEO BI-FLEX JOINT SHIELD PO) Take 1 capsule by mouth daily.  . Nintedanib (OFEV) 100 MG CAPS Take 100 mg by mouth 2 (two) times daily.  . nitroGLYCERIN (NITROSTAT) 0.4 MG SL tablet Place 1 tablet (0.4 mg total) under the tongue every 5 (five) minutes x 3 doses as needed. (Patient taking differently: Place 0.4 mg under the tongue every 5 (five) minutes x 3 doses as needed for chest [name]. )  .  Omega-3 Fatty Acids (FISH OIL) 1200 MG CAPS Take 1,200 mg by mouth daily.   . potassium chloride SA (K-DUR,KLOR-CON) 20 MEQ tablet Take 2 tablets (40 mEq total) by mouth daily.  . rosuvastatin (CRESTOR) 10 MG tablet Take 1 tablet (10 mg total) by  mouth daily.  Marland Kitchen sulfaSALAzine (AZULFIDINE) 500 MG tablet Take 1 tablet (500 mg total) by mouth 2 (two) times daily.  . varenicline (CHANTIX CONTINUING MONTH PAK) 1 MG tablet Take 1 tablet (1 mg total) by mouth 2 (two) times daily. (Patient not taking: Reported on 01/22/2018)   No current facility-administered medications on file prior to visit.      Allergies  Allergen Reactions  . Doxycycline Other (See Comments)    Poss. Photosensitivity  . Mercaptopurine Other (See Comments)    REACTION: pancreatitis  . Simvastatin Other (See Comments)    Severe leg aches  . Sulfasalazine   . Penicillins Rash and Other (See Comments)    Has patient had a PCN reaction causing immediate rash, facial/tongue/throat swelling, SOB or lightheadedness with hypotension: No Has patient had a PCN reaction causing severe rash involving mucus membranes or skin necrosis: No Has patient had a PCN reaction that required hospitalization: No Has patient had a PCN reaction occurring within the last 10 years: No If all of the above answers are "NO", then may proceed with Cephalosporin use.   . Sulfa Antibiotics Rash    Review Of Systems:  Constitutional:   No  weight loss, night sweats,  Fevers, chills,+ fatigue, or  lassitude.  HEENT:   No headaches,  Difficulty swallowing,  Tooth/dental problems, or  Sore throat,                No sneezing, itching, ear ache, nasal congestion, post nasal drip,   CV:  No chest [name],  Orthopnea, PND, + swelling in lower extremities, anasarca, dizziness, palpitations, syncope.   GI  No heartburn, indigestion, abdominal [name], nausea, vomiting, diarrhea, change in bowel habits, + loss of appetite, No bloody stools.   Resp: + shortness of breath with minimal exertion lasts at rest.  No excess mucus, no productive cough,  No non-productive cough,  No coughing up of blood.  No change in color of mucus.  No wheezing.  No chest wall deformity  Skin: no rash or lesions.  GU: no  dysuria, change in color of urine, no urgency or frequency.  No flank [name], no hematuria   MS:  No joint [name] or swelling.  No decreased range of motion.  No back [name].  Psych:  No change in mood or affect. + depression or anxiety.  No memory loss.   Vital Signs BP 122/70 (BP Location: Left Arm, Cuff Size: Normal)   Pulse 62   Ht _0  (1.651 m)   Wt 218 lb (98.9 kg)   SpO2 92%   BMI 36.28 kg/m    Physical Exam:  General- No distress,  A&Ox3, pleasant elderly lady wearing her nasal oxygen ENT: No sinus tenderness, TM clear, pale nasal mucosa, no oral exudate,no post nasal drip, no LAN Cardiac: S1, S2, regular rate and rhythm, no murmur Chest: No  wheeze/ + crackles bilaterally per bases/ No dullness; no accessory muscle use, no nasal flaring, no sternal retractions Abd.: Soft Non-tender, nondistended, bowel sounds positive, obese,abdomina studies 3 weeks out right nowl fullness,  Body mass index is 36.28 kg/m. Ext: No clubbing cyanosis, 1-2 + BLE edema Neuro: Deconditioned at baseline, moving all extremities x4, alert and  oriented x3, walks with walker very slowly Skin: No rashes, warm and dry Psych: normal mood and behavior   Assessment/Plan  COPD without exacerbation (HCC) No significant improvement with the Anoro Patient is requesting another inhaler to try PFTs do show a positive bronchodilator response Plan Try Symbicort 2 puffs twice daily Rinse mouth after use Let us know if you like it Maintain sats 88 to 92% Wear oxygen at 2 L with continuous oxygen and 3 to 4 L with pulsed oxygen.  Pulmonary artery hypertension ([place]) Patient currently not compliant with Lasix Weight increase of 19 pounds in 5 to 6 weeks Patient is noncompliant with wearing oxygen at all times Plan CXR today We will do labs today.( CMET) It is imperative that you use your oxygen at all times. Saturation goals are 88-92% We do not want you to drop you saturation below 88%. Please wear  your oxygen at 2 L nasal cannula when you are using continuous oxygen When you are wearing the pulsed oxygen you need to wear your oxygen at 3 to 4 L nasal cannula to maintain saturations  88% - 92 % We will walk you on a smaller POC which is pulsed oxygen. Take your Lasix every day without fail. We may need to increase dose if you continue to retain fluid. Limit your salt intake.This makes your fluid retention worse Weigh yourself every day. Referral for pulmonary rehab to work on physical conditioning Call cardiology for  increase in weight despite compliance with Lasix Follow-up with cardiology next week as is scheduled   TOBACCO ABUSE Quit February 2019 Counseled to remain smoke-free  Chronic respiratory failure with hypoxia ([place]) Patient is noncompliant with oxygen Plan It is imperative that you use your oxygen at all times. Saturation goals are 88-92% We do not want you to drop you saturation below 88%. Please wear your oxygen at 2 L nasal cannula when you are using continuous oxygen When you are wearing the pulsed oxygen you need to wear your oxygen at 3 to 4 L nasal cannula to maintain saturations  88% - 92 % We will walk you on a smaller POC which is pulsed oxygen. We will refer you to Pulmonary rehab at [facility]) Follow up with Dr. Lake Bells or Judson Roch in 1 month. Please contact office for sooner follow up if symptoms do not improve or worsen or seek emergency care      IPF (idiopathic pulmonary fibrosis) ([place]) Patient has been compliant with Ofev  At  100 mg twice daily Patient claims she has poor appetite since initiating Patient does not have any other GI complications Plan LFTs today for therapeutic drug monitoring Continue  Ofev 100 mg twice daily Follow-up in 4 weeks to monitor tolerance LFTs at follow-up Consider increasing to 150 mg twice daily as she is tolerating well   Snoring Per daughter concern for sleep apnea Plan We will schedule a  home sleep study to evaluate for OSA  Thyroid nodule Ultrasound results indicate 2.0 cm nodule within the right lobe of the thyroid. Meets criteria for imaging and percutaneous sampling Patient was called by [facility] endocrinology and refused biopsy at the time as she was overwhelmed with her current medical issues Plan We refer to [facility] Endocrinology Counseled patient on the fact that she needs to have this procedure done.  Dry mouth Patient has noticed dry mouth with initiation of Ofv and use of Lasix Plan: Ice chips , sugar free hard candy for dry mouth Prescription for  Biotene for dry mouth.     Protein calorie malnutrition ([place]) Patient has lack of appetite with OFEV Per daughter patient is replacing meals with snacks and high sugar content foods and beverages Plan Boost for meal supplement. Avoid replacing a meals with sugar. Low-sodium diet       Magdalen Spatz, NP 02/12/2018  11:41 AM

## 2018-02-12 NOTE — Assessment & Plan Note (Signed)
Ultrasound results indicate 2.0 cm nodule within the right lobe of the thyroid. Meets criteria for imaging and percutaneous sampling Patient was called by [facility] endocrinology and refused biopsy at the time as she was overwhelmed with her current medical issues Plan We refer to [facility] Endocrinology Counseled patient on the fact that she needs to have this procedure done.

## 2018-02-12 NOTE — Assessment & Plan Note (Signed)
Patient has noticed dry mouth with initiation of Ofv and use of Lasix Plan: Ice chips , sugar free hard candy for dry mouth Prescription for Biotene for dry mouth.

## 2018-02-12 NOTE — Assessment & Plan Note (Signed)
Patient is noncompliant with oxygen Plan It is imperative that you use your oxygen at all times. Saturation goals are 88-92% We do not want you to drop you saturation below 88%. Please wear your oxygen at 2 L nasal cannula when you are using continuous oxygen When you are wearing the pulsed oxygen you need to wear your oxygen at 3 to 4 L nasal cannula to maintain saturations  88% - 92 % We will walk you on a smaller POC which is pulsed oxygen. We will refer you to Pulmonary rehab at [facility]) Follow up with Dr. Lake Bells or Judson Roch in 1 month. Please contact office for sooner follow up if symptoms do not improve or worsen or seek emergency care

## 2018-02-12 NOTE — Patient Instructions (Addendum)
It is nice to meet you. Continue Ofev at 100 mg twice daily. CXR today We will do labs today.( CMET) We will schedule a home sleep study to evaluate you for sleep apnea. It is imperative that you use your oxygen at all times. Saturation goals are 88-92% We do not want you to drop you saturation below 88%. Please wear your oxygen at 2 L nasal cannula when you are using continuous oxygen When you are wearing the pulsed oxygen you need to wear your oxygen at 3 to 4 L nasal cannula to maintain saturations  88% - 92 % We will walk you on a smaller POC which is pulsed oxygen. Take your Lasix every day without fail. We may need to increase dose if you continue to retain fluid. Limit your salt intake.This makes your fluid retention worse Weigh yourself every day. We will need to schedule a biopsy of your thyroid. We will see which endocrinologist you were referred to  for follow up.(  Ice chips , sugar free hard candy for dry mouth Prescription for Biotene for dry mouth. We will try Symbicort since the Anoro did not work. 2 puffs twice daily Rinse mouth after use. We will refer you to Pulmonary rehab at [facility] ( Dr.McQuaid) Boost for meal supplement. Avoid replacing a meal with sugar.  Follow up with Dr. Lake Bells or Judson Roch in 1 month. Please contact office for sooner follow up if symptoms do not improve or worsen or seek emergency care

## 2018-02-12 NOTE — Assessment & Plan Note (Signed)
No significant improvement with the Anoro Patient is requesting another inhaler to try PFTs do show a positive bronchodilator response Plan Try Symbicort 2 puffs twice daily Rinse mouth after use Let us know if you like it Maintain sats 88 to 92% Wear oxygen at 2 L with continuous oxygen and 3 to 4 L with pulsed oxygen.

## 2018-02-12 NOTE — Assessment & Plan Note (Signed)
Patient has lack of appetite with OFEV Per daughter patient is replacing meals with snacks and high sugar content foods and beverages Plan Boost for meal supplement. Avoid replacing a meals with sugar. Low-sodium diet

## 2018-02-12 NOTE — Assessment & Plan Note (Signed)
Quit February 2019 Counseled to remain smoke-free

## 2018-02-12 NOTE — Assessment & Plan Note (Signed)
Per daughter concern for sleep apnea Plan We will schedule a home sleep study to evaluate for OSA

## 2018-02-13 ENCOUNTER — Telehealth: Payer: Self-pay | Admitting: Acute Care

## 2018-02-13 NOTE — Telephone Encounter (Signed)
Called and spoke with pt's daughter [name] and pt stating the information per [place] with [facility].  [name] expressed understanding. An appt was made Monday, 6/24 at 9:30 for pt to have a qualifying walk using [employer] so that way we can make sure pt will be able to tolerate pulse flow.  Called Corene Cornea with [facility] to make him aware of the appt that was scheduled. Corene Cornea expressed understanding.  Nothing further needed at this time.

## 2018-02-16 ENCOUNTER — Telehealth: Payer: Self-pay | Admitting: Acute Care

## 2018-02-16 ENCOUNTER — Ambulatory Visit (INDEPENDENT_AMBULATORY_CARE_PROVIDER_SITE_OTHER): Payer: PPO | Admitting: *Deleted

## 2018-02-16 DIAGNOSIS — J84112 Idiopathic pulmonary fibrosis: Secondary | ICD-10-CM | POA: Diagnosis not present

## 2018-02-16 DIAGNOSIS — R7989 Other specified abnormal findings of blood chemistry: Secondary | ICD-10-CM

## 2018-02-16 DIAGNOSIS — R945 Abnormal results of liver function studies: Secondary | ICD-10-CM

## 2018-02-16 NOTE — Telephone Encounter (Signed)
Spoke with pt. States that she has an appointment with Dr. Domenic Polite this week and will have the blood work done there. Nothing further was needed.

## 2018-02-16 NOTE — Progress Notes (Signed)
Patient here for qualifying walk for POC documentation in pulse oximetry tab.

## 2018-02-16 NOTE — Telephone Encounter (Signed)
Please call patient and let her know I would like her to have labs( CMET ) this week to make sure her liver function tests have stabilized. Just labs, no OV this week . Let her know we will call her with the results. Please place order for CMET, schedule for [facility] as the patient lives in [place],. And this will be more convenient. Thanks so much.

## 2018-02-18 NOTE — Progress Notes (Signed)
Cardiology Office Note  Date: 02/19/2018   ID: Shelley [name], DOB 07-Apr-1941, MRN [id number]  PCP: [facility]  Primary Cardiologist: Rozann Lesches, MD   Chief Complaint  Patient presents with  . Coronary Artery Disease    History of Present Illness: Shelley [name] is a 77 y.o. female last seen in January.  Interval records reviewed, she has had follow-up with Dr. Aundra Dubin as of March.  Left and right heart catheterization demonstrated 70% in-stent restenosis of the RCA that was managed medically, normal filling pressures although moderate pulmonary hypertension.  She had no evidence of thromboembolic disease based on subsequent work-up although PFTs demonstrated moderate to severe obstructive defect.  She is trying to quit smoking on Chantix and continues on diuretics although they have been cut back.  She is also now following in the Pulmonary clinic for COPD and IPF.  She presents for routine cardiac follow-up.  At this point not reporting any angina symptoms.  She remains chronically short of breath, tells me that she has not been using her oxygen regularly.  We discussed rationale for continued use.  She also has noticed weight gain with increasing abdominal girth and leg swelling although has not modified her diuretic dose.  She has continued on Lasix at 40 mg daily.  She is about 18 pounds over her previous weight.  She continues to follow with [facility] for management of PAD and carotid artery disease.  Carotid Dopplers in May demonstrated 60 to 79% RICA stenosis and 40 to 70% LICA stenosis.  Current regimen includes aspirin, Plavix, Lasix, potassium supplements, Toprol-XL, Crestor, and as needed nitroglycerin.  Past Medical History:  Diagnosis Date  . Anxiety   . Carotid artery disease ([place])    48-88% RICA and LICA 9/16 - Dr. Oneida Alar  . Cataract   . COPD (chronic obstructive pulmonary disease) ([place])   . Coronary atherosclerosis of native coronary artery    BMS  to RCA 2004  . Depression   . DVT (deep venous thrombosis) ([place])   . Essential hypertension, benign   . Fracture Left Great Toe  . Hepatic steatosis   . History of melanoma   . History of oral aphthous ulcers   . History of Salmonella gastroenteritis   . Hx of adenomatous colonic polyps   . Mixed hyperlipidemia   . Myocardial infarction ([place])    IMI 10/04  . Osteoarthritis   . Osteoporosis   . Pancreatitis   . Rectal bleeding   . Regional enteritis of large intestine ([place])   . Sinus polyp   . TIA (transient ischemic attack)   . Tubular adenoma of colon 08/2012    Past Surgical History:  Procedure Laterality Date  . CATARACT EXTRACTION Bilateral   . Melanoma resection  1996  . RIGHT/LEFT HEART CATH AND CORONARY ANGIOGRAPHY N/A 10/30/2017   Procedure: RIGHT/LEFT HEART CATH AND CORONARY ANGIOGRAPHY;  Surgeon: Larey Dresser, MD;  Location: [facility];  Service: Cardiovascular;  Laterality: N/A;  . TOTAL KNEE ARTHROPLASTY Right Feb 2012    Current Outpatient Medications  Medication Sig Dispense Refill  . antiseptic oral rinse (BIOTENE) LIQD 15 mLs by Mouth Rinse route as needed for dry mouth. 1 Bottle 2  . aspirin 81 MG tablet Take 81 mg by mouth daily.      . budesonide-formoterol (SYMBICORT) 160-4.5 MCG/ACT inhaler Inhale 2 puffs into the lungs 2 (two) times daily. 1 Inhaler 0  . clopidogrel (PLAVIX) 75 MG tablet Take  1 tablet (75 mg total) by mouth daily. 30 tablet 6  . Cyanocobalamin (B-12 PO) Take 1 tablet by mouth See admin instructions. Take 1 capsule every day x 7 days every other week     . dicyclomine (BENTYL) 10 MG capsule Take 1 capsule (10 mg total) by mouth as needed for spasms. 30 capsule 3  . ferrous sulfate 325 (65 FE) MG tablet Take 325 mg by mouth daily.    Marland Kitchen Fexofenadine HCl (ALLEGRA ALLERGY PO) Take 1 tablet by mouth daily as needed (for allergies).     Marland Kitchen FLUoxetine (PROZAC) 40 MG capsule Take 40 mg by mouth daily.    . folic acid (FOLVITE) 1 MG  tablet Take 1 tablet (1 mg total) by mouth daily. 90 tablet 4  . gabapentin (NEURONTIN) 100 MG capsule Take 1 capsule (100 mg total) by mouth 2 (two) times daily. 60 capsule 6  . HYDROcodone-acetaminophen (NORCO) 10-325 MG tablet Take 1 tablet by mouth every 6 (six) hours as needed for moderate pain.     . metoprolol succinate (TOPROL-XL) 25 MG 24 hr tablet Take 1 tablet (25 mg total) by mouth daily. 90 tablet 3  . Misc Natural Products (OSTEO BI-FLEX JOINT SHIELD PO) Take 1 capsule by mouth daily.    . Nintedanib (OFEV) 100 MG CAPS Take 100 mg by mouth 2 (two) times daily.    . nitroGLYCERIN (NITROSTAT) 0.4 MG SL tablet Place 1 tablet (0.4 mg total) under the tongue every 5 (five) minutes x 3 doses as needed. (Patient taking differently: Place 0.4 mg under the tongue every 5 (five) minutes x 3 doses as needed for chest pain. ) 25 tablet 3  . Omega-3 Fatty Acids (FISH OIL) 1200 MG CAPS Take 1,200 mg by mouth daily.     . potassium chloride SA (K-DUR,KLOR-CON) 20 MEQ tablet Take 2 tablets (40 mEq total) by mouth daily. 60 tablet 3  . rosuvastatin (CRESTOR) 10 MG tablet Take 1 tablet (10 mg total) by mouth daily. 30 tablet 3  . sulfaSALAzine (AZULFIDINE) 500 MG tablet Take 1 tablet (500 mg total) by mouth 2 (two) times daily. 60 tablet 6  . furosemide (LASIX) 40 MG tablet Take 1 tablet (40 mg total) by mouth 2 (two) times daily. Take 40 mg twice daily for 1 week then take 40 mg in the morning and 20 mg in the evening 30 tablet 0   No current facility-administered medications for this visit.    Allergies:  Doxycycline; Mercaptopurine; Simvastatin; Sulfasalazine; Penicillins; and Sulfa antibiotics   Social History: The patient  reports that she quit smoking about 4 months ago. Her smoking use included cigarettes. She started smoking about 60 years ago. She has a 10.00 pack-year smoking history. She has never used smokeless tobacco. She reports that she does not drink alcohol or use drugs.   ROS:   Please see the history of present illness. Otherwise, complete review of systems is positive for chronic shortness of breath, leg swelling.  All other systems are reviewed and negative.   Physical Exam: VS:  BP 140/82   Pulse 80   Ht _0  (1.651 m)   Wt 218 lb (98.9 kg)   SpO2 (!) 89%   BMI 36.28 kg/m , BMI Body mass index is 36.28 kg/m.  Wt Readings from Last 3 Encounters:  02/19/18 218 lb (98.9 kg)  02/12/18 218 lb (98.9 kg)  01/22/18 199 lb (90.3 kg)    General: Patient appears comfortable at rest. HEENT: Conjunctiva  and lids normal, oropharynx clear. Neck: Supple, no elevated JVP or carotid bruits, no thyromegaly. Lungs: Coarse breath sounds without wheezing, nonlabored breathing at rest. Cardiac: Regular rate and rhythm, no S3 or significant systolic murmur. Abdomen: Increased girth, nontender, bowel sounds present. Extremities: Chronic appearing bilateral leg edema and lymphedema, tight with venous stasis, distal pulses 1+. Skin: Warm and dry. Musculoskeletal: No kyphosis. Neuropsychiatric: Alert and oriented x3, affect grossly appropriate.  ECG: I personally reviewed the tracing from 10/30/2017 which showed sinus rhythm with old inferior infarct pattern and poor anterior R wave progression, rule out old anterior infarct pattern.  Recent Labwork: 10/30/2017: Hemoglobin 15.3; Platelets 206 02/12/2018: ALT 43; AST 35; BUN 16; Creatinine, Ser 0.77; Potassium 4.2; Sodium 142     Component Value Date/Time   CHOL 131 01/22/2018 1203   TRIG 73 01/22/2018 1203   HDL 64 01/22/2018 1203   CHOLHDL 2.0 01/22/2018 1203   VLDL 15 01/22/2018 1203   LDLCALC 52 01/22/2018 1203    Other Studies Reviewed Today:  Echocardiogram 09/24/2017: Study Conclusions  - Left ventricle: The cavity size was normal. Wall thickness was   increased in a pattern of mild LVH. Systolic function was normal.   The estimated ejection fraction was in the range of 55% to 60%.   Doppler parameters are  consistent with abnormal left ventricular   relaxation (grade 1 diastolic dysfunction). Doppler parameters   are consistent with high ventricular filling pressure. - Aortic valve: Mildly calcified annulus. Trileaflet; mildly   thickened leaflets. Valve area (VTI): 1.73 cm^2. Valve area   (Vmax): 1.73 cm^2. Valve area (Vmean): 1.62 cm^2. - Mitral valve: There was mild regurgitation. - Right ventricle: The cavity size was moderately dilated. Systolic   function was moderately reduced. TAPSE: 11.3 mm . Lateral annulus   peak S velocity: 8.55 cm/s. - Right atrium: The atrium was moderately dilated. - Atrial septum: No defect or patent foramen ovale was identified. - Tricuspid valve: There was moderate regurgitation. - Pulmonary arteries: Systolic pressure was severely increased. PA   peak pressure: 74 mm Hg (S). - Technically adequate study.  Left and right heart catheterization 10/30/2017: 1. Moderate pulmonary arterial hypertension, PVR not markedly elevated at 3.9 WU.   2. Low filling pressures.  3. 70% in-stent restenosis in proximal RCA.   Assessment and Plan:  1.  Chronic diastolic heart failure with RV failure and cor pulmonale.  Weight is up about 18 pounds with increased abdominal girth and leg swelling.  Plan is to increase Lasix to 40 mg twice daily with KCl 40 mEq once daily for the next 7 days, then change to 40 mg in the morning and 20 mg in the afternoon with follow-up visit in 2 weeks and BMET.  2.  COPD and IPF, followed by Pulmonary.  He has chronic hypoxic respiratory failure, I encouraged her to be consistent with supplemental oxygen use.  3.  Pulmonary hypertension, primarily group 3.  4.  CAD with history of BMS to the RCA.  Recent cardiac catheterization in March showed 40% in-stent restenosis that is presently being managed medically.  She remains on antiplatelet regimen and statin therapy.  Current medicines were reviewed with the patient today.   Disposition:  Follow-up in 2 weeks for clinical reevaluation.  Signed, Satira Sark, MD, [facility] 02/19/2018 10:32 AM    [facility] at [place], [place], [place] [address] Phone: [phone]; Fax: [phone]

## 2018-02-19 ENCOUNTER — Encounter: Payer: Self-pay | Admitting: Cardiology

## 2018-02-19 ENCOUNTER — Ambulatory Visit: Payer: PPO | Admitting: Cardiology

## 2018-02-19 VITALS — BP 140/82 | HR 80 | Ht 65.0 in | Wt 218.0 lb

## 2018-02-19 DIAGNOSIS — I272 Pulmonary hypertension, unspecified: Secondary | ICD-10-CM | POA: Diagnosis not present

## 2018-02-19 DIAGNOSIS — J84112 Idiopathic pulmonary fibrosis: Secondary | ICD-10-CM | POA: Diagnosis not present

## 2018-02-19 DIAGNOSIS — I25119 Atherosclerotic heart disease of native coronary artery with unspecified angina pectoris: Secondary | ICD-10-CM | POA: Diagnosis not present

## 2018-02-19 DIAGNOSIS — I5032 Chronic diastolic (congestive) heart failure: Secondary | ICD-10-CM

## 2018-02-19 MED ORDER — FUROSEMIDE 40 MG PO TABS: 40.0000 mg | ORAL_TABLET | Freq: Two times a day (BID) | ORAL | 0 refills | Status: DC

## 2018-02-19 NOTE — Patient Instructions (Signed)
Medication Instructions:  Your physician has recommended you make the following change in your medication:   LASIX 40 mg twice daily for 1 week then take lasix 40 mg in the morning and 20 mg in the evening  Please continue with potassium 40 meq  Please continue all other medications as prescribed  Labwork: NONE  Testing/Procedures: NONE  Follow-Up: Your physician recommends that you schedule a follow-up appointment in: 2 [place]   Any Other Special Instructions Will Be Listed Below (If Applicable).  If you need a refill on your cardiac medications before your next appointment, please call your pharmacy.

## 2018-02-23 NOTE — Progress Notes (Signed)
Pt's 6 month f/u has been scheduled

## 2018-03-03 ENCOUNTER — Telehealth: Payer: Self-pay | Admitting: Acute Care

## 2018-03-03 DIAGNOSIS — R2689 Other abnormalities of gait and mobility: Secondary | ICD-10-CM | POA: Diagnosis not present

## 2018-03-03 DIAGNOSIS — R0602 Shortness of breath: Secondary | ICD-10-CM | POA: Diagnosis not present

## 2018-03-03 DIAGNOSIS — E782 Mixed hyperlipidemia: Secondary | ICD-10-CM

## 2018-03-03 DIAGNOSIS — I5032 Chronic diastolic (congestive) heart failure: Secondary | ICD-10-CM | POA: Diagnosis not present

## 2018-03-03 DIAGNOSIS — Z79899 Other long term (current) drug therapy: Secondary | ICD-10-CM

## 2018-03-03 NOTE — Progress Notes (Signed)
Cardiology Office Note  Date: 03/05/2018   ID: Shelley [name], DOB [birth year], MRN [id number]  PCP: [facility]  Primary Cardiologist: Rozann Lesches, MD   Chief Complaint  Patient presents with  . Diastolic heart failure    History of Present Illness: Shelley [name] is a 77 y.o. female seen recently in late June.  At that time Lasix dose was increased due to acute on chronic diastolic heart failure with approximately 18 pound weight gain.  She presents today for follow-up, states that she feels much better.  Her weight is down 16 pounds.  She is due for follow-up lab work.  She has a follow-up with Pulmonary in the next few weeks and is to have a CMET at that time.  I reviewed her medications. At the current time she is taking Lasix 40 mg in the morning and 20 mg in the afternoon.  Past Medical History:  Diagnosis Date  . Anxiety   . Carotid artery disease ([place])    49-44% RICA and LICA 9/67 - Dr. Oneida Alar  . Cataract   . COPD (chronic obstructive pulmonary disease) ([place])   . Coronary atherosclerosis of native coronary artery    BMS to RCA 2004  . Depression   . DVT (deep venous thrombosis) ([place])   . Essential hypertension, benign   . Fracture Left Great Toe  . Hepatic steatosis   . History of melanoma   . History of oral aphthous ulcers   . History of Salmonella gastroenteritis   . Hx of adenomatous colonic polyps   . Mixed hyperlipidemia   . Myocardial infarction ([place])    IMI 10/04  . Osteoarthritis   . Osteoporosis   . Pancreatitis   . Rectal bleeding   . Regional enteritis of large intestine ([place])   . Sinus polyp   . TIA (transient ischemic attack)   . Tubular adenoma of colon 08/2012    Past Surgical History:  Procedure Laterality Date  . CATARACT EXTRACTION Bilateral   . Melanoma resection  1996  . RIGHT/LEFT HEART CATH AND CORONARY ANGIOGRAPHY N/A 10/30/2017   Procedure: RIGHT/LEFT HEART CATH AND CORONARY ANGIOGRAPHY;  Surgeon: Larey Dresser, MD;  Location: [facility];  Service: Cardiovascular;  Laterality: N/A;  . TOTAL KNEE ARTHROPLASTY Right Feb 2012    Current Outpatient Medications  Medication Sig Dispense Refill  . antiseptic oral rinse (BIOTENE) LIQD 15 mLs by Mouth Rinse route as needed for dry mouth. 1 Bottle 2  . aspirin 81 MG tablet Take 81 mg by mouth daily.      . budesonide-formoterol (SYMBICORT) 160-4.5 MCG/ACT inhaler Inhale 2 puffs into the lungs 2 (two) times daily. 1 Inhaler 0  . clopidogrel (PLAVIX) 75 MG tablet Take 1 tablet (75 mg total) by mouth daily. 30 tablet 6  . Cyanocobalamin (B-12 PO) Take 1 tablet by mouth See admin instructions. Take 1 capsule every day x 7 days every other week     . dicyclomine (BENTYL) 10 MG capsule Take 1 capsule (10 mg total) by mouth as needed for spasms. 30 capsule 3  . ferrous sulfate 325 (65 FE) MG tablet Take 325 mg by mouth daily.    Marland Kitchen Fexofenadine HCl (ALLEGRA ALLERGY PO) Take 1 tablet by mouth daily as needed (for allergies).     Marland Kitchen FLUoxetine (PROZAC) 40 MG capsule Take 40 mg by mouth daily.    . folic acid (FOLVITE) 1 MG tablet Take 1 tablet (1 mg  total) by mouth daily. 90 tablet 4  . furosemide (LASIX) 40 MG tablet Take 1 tablet (40 mg total) by mouth 2 (two) times daily. Take 40 mg twice daily for 1 week then take 40 mg in the morning and 20 mg in the evening 30 tablet 0  . gabapentin (NEURONTIN) 100 MG capsule Take 1 capsule (100 mg total) by mouth 2 (two) times daily. 60 capsule 6  . HYDROcodone-acetaminophen (NORCO) 10-325 MG tablet Take 1 tablet by mouth every 6 (six) hours as needed for moderate pain.     . metoprolol succinate (TOPROL-XL) 25 MG 24 hr tablet Take 1 tablet (25 mg total) by mouth daily. 90 tablet 3  . Misc Natural Products (OSTEO BI-FLEX JOINT SHIELD PO) Take 1 capsule by mouth daily.    . Nintedanib (OFEV) 100 MG CAPS Take 100 mg by mouth 2 (two) times daily.    . nitroGLYCERIN (NITROSTAT) 0.4 MG SL tablet Place 1 tablet (0.4 mg  total) under the tongue every 5 (five) minutes x 3 doses as needed. (Patient taking differently: Place 0.4 mg under the tongue every 5 (five) minutes x 3 doses as needed for chest pain. ) 25 tablet 3  . Omega-3 Fatty Acids (FISH OIL) 1200 MG CAPS Take 1,200 mg by mouth daily.     . potassium chloride SA (K-DUR,KLOR-CON) 20 MEQ tablet Take 2 tablets (40 mEq total) by mouth daily. 60 tablet 3  . rosuvastatin (CRESTOR) 10 MG tablet Take 1 tablet (10 mg total) by mouth daily. 30 tablet 3  . sulfaSALAzine (AZULFIDINE) 500 MG tablet Take 1 tablet (500 mg total) by mouth 2 (two) times daily. 60 tablet 6   No current facility-administered medications for this visit.    Allergies:  Doxycycline; Mercaptopurine; Simvastatin; Sulfasalazine; Penicillins; and Sulfa antibiotics   Social History: The patient  reports that she quit smoking about 5 months ago. Her smoking use included cigarettes. She started smoking about 60 years ago. She has a 10.00 pack-year smoking history. She has never used smokeless tobacco. She reports that she does not drink alcohol or use drugs.   ROS:  Please see the history of present illness. Otherwise, complete review of systems is positive for chronic dyspnea.  All other systems are reviewed and negative.   Physical Exam: VS:  BP 140/68   Pulse 63   Ht _0  (1.651 m)   Wt 202 lb 12.8 oz (92 kg)   SpO2 92%   BMI 33.75 kg/m , BMI Body mass index is 33.75 kg/m.  Wt Readings from Last 3 Encounters:  03/05/18 202 lb 12.8 oz (92 kg)  02/19/18 218 lb (98.9 kg)  02/12/18 218 lb (98.9 kg)    General: Patient appears comfortable at rest. HEENT: Conjunctiva and lids normal, oropharynx clear. Neck: Supple, no elevated JVP or carotid bruits, no thyromegaly. Lungs: Coarse breath sounds without wheezing, nonlabored breathing at rest. Cardiac: Regular rate and rhythm, no S3 or significant systolic murmur. Abdomen: Soft, nontender, bowel sounds present. Extremities: Improved leg  edema and lymphedema with venous stasis noted, distal pulses 1+. Skin: Warm and dry. Musculoskeletal: No kyphosis. Neuropsychiatric: Alert and oriented x3, affect grossly appropriate.  ECG: I personally reviewed the tracing from 10/30/2017 which showed sinus rhythm with old inferior infarct pattern and poor anterior R wave progression, rule out old anterior infarct pattern.  Recent Labwork: 10/30/2017: Hemoglobin 15.3; Platelets 206 02/12/2018: ALT 43; AST 35; BUN 16; Creatinine, Ser 0.77; Potassium 4.2; Sodium 142  Component Value Date/Time   CHOL 131 01/22/2018 1203   TRIG 73 01/22/2018 1203   HDL 64 01/22/2018 1203   CHOLHDL 2.0 01/22/2018 1203   VLDL 15 01/22/2018 1203   LDLCALC 52 01/22/2018 1203    Other Studies Reviewed Today:  Echocardiogram 09/24/2017: Study Conclusions  - Left ventricle: The cavity size was normal. Wall thickness was increased in a pattern of mild LVH. Systolic function was normal. The estimated ejection fraction was in the range of 55% to 60%. Doppler parameters are consistent with abnormal left ventricular relaxation (grade 1 diastolic dysfunction). Doppler parameters are consistent with high ventricular filling pressure. - Aortic valve: Mildly calcified annulus. Trileaflet; mildly thickened leaflets. Valve area (VTI): 1.73 cm^2. Valve area (Vmax): 1.73 cm^2. Valve area (Vmean): 1.62 cm^2. - Mitral valve: There was mild regurgitation. - Right ventricle: The cavity size was moderately dilated. Systolic function was moderately reduced. TAPSE: 11.3 mm . Lateral annulus peak S velocity: 8.55 cm/s. - Right atrium: The atrium was moderately dilated. - Atrial septum: No defect or patent foramen ovale was identified. - Tricuspid valve: There was moderate regurgitation. - Pulmonary arteries: Systolic pressure was severely increased. PA peak pressure: 74 mm Hg (S). - Technically adequate study.  Left and right heart catheterization  10/30/2017: 1. Moderate pulmonary arterial hypertension, PVR not markedly elevated at 3.9 WU.  2. Low filling pressures.  3. 70% in-stent restenosis in proximal RCA.   Assessment and Plan:  1.  Chronic diastolic heart failure and RV failure with cor pulmonale.  She has had significant diuresis following adjustments in Lasix, weight is down 16 pounds.  Plan to continue Lasix at 40 mg in the morning and 20 mg in the afternoon with potassium supplements.  She will have a follow-up CMET per Pulmonary in the next few weeks.  2.  CAD with history of BMS to the RCA.  Cardiac catheterization demonstrated 40% in-stent restenosis as of March and she is clinically stable without active angina on medical therapy.  Current medicines were reviewed with the patient today.  Disposition: Follow-up in 3 months.  Signed, Satira Sark, MD, [facility] 03/05/2018 2:21 PM    [facility] at [place], [place], [place] [address] Phone: [phone]; Fax: [phone]

## 2018-03-03 NOTE — Telephone Encounter (Signed)
Called and spoke with Patient about LFT's being done.  Patient stated that she called and spoke with someone last week at [facility], about Dr. Domenic Polite refusing to do labs in his office.  She stated that he wanted her to have them done at  [facility] or [facility]. Patient has appointment 03/12/18 at 0900, with SG.  She has requested labs be done at [facility] office, because of appointment and she has to pick up her the box for HST.  Orders placed for LFT, so Patient can have labs drawn before 7/18 appointment.   Will route to SG

## 2018-03-03 NOTE — Telephone Encounter (Signed)
Please call patient and see if she had her LFT's repeated at Dr. Myles Gip [facility], like she said she would do in her 6/24/phone conversation. Marland Kitchen Her LFT's have not been rechecked that I can see in [facility] and we need to make sure her ALT returned to normal as she is on OFEV. If she did have them repeated we need to have the labs sent here. If she did not have  them done,  please have her go to have them drawn in [place]. We will call her with results. I think the order is already in the system.Thanks so much

## 2018-03-04 NOTE — Telephone Encounter (Signed)
Her appointment is 9 days in the future. I would prefer she have the labs drawn at [facility] sooner if possible so we can make sure her LFT's have normalized. Thanks  If she refuses, please make sure she understands that this is part of the drug monitoring we do for the OFEV she is on and that waiting to draw the labs may be detrimental to her health.

## 2018-03-04 NOTE — Telephone Encounter (Signed)
Left message for patient to call back.

## 2018-03-05 ENCOUNTER — Encounter: Payer: Self-pay | Admitting: Cardiology

## 2018-03-05 ENCOUNTER — Encounter

## 2018-03-05 ENCOUNTER — Ambulatory Visit: Payer: PPO | Admitting: Cardiology

## 2018-03-05 VITALS — BP 140/68 | HR 63 | Ht 65.0 in | Wt 202.8 lb

## 2018-03-05 DIAGNOSIS — I5032 Chronic diastolic (congestive) heart failure: Secondary | ICD-10-CM | POA: Diagnosis not present

## 2018-03-05 DIAGNOSIS — I25119 Atherosclerotic heart disease of native coronary artery with unspecified angina pectoris: Secondary | ICD-10-CM | POA: Diagnosis not present

## 2018-03-05 NOTE — Patient Instructions (Signed)
Medication Instructions:  Your physician recommends that you continue on your current medications as directed. Please refer to the Current Medication list given to you today.  Labwork: NONE  Testing/Procedures: NONE  Follow-Up: Your physician recommends that you schedule a follow-up appointment in: [place]. [place].  Any Other Special Instructions Will Be Listed Below (If Applicable).  If you need a refill on your cardiac medications before your next appointment, please call your pharmacy.

## 2018-03-10 ENCOUNTER — Ambulatory Visit: Payer: PPO | Admitting: Physician Assistant

## 2018-03-12 ENCOUNTER — Other Ambulatory Visit: Payer: Self-pay | Admitting: Cardiology

## 2018-03-12 ENCOUNTER — Ambulatory Visit: Payer: PPO | Admitting: Acute Care

## 2018-03-12 MED ORDER — METOPROLOL SUCCINATE ER 25 MG PO TB24: 25.0000 mg | ORAL_TABLET | Freq: Every day | ORAL | 0 refills | Status: DC

## 2018-03-12 NOTE — Telephone Encounter (Signed)
° °  1. Which medications need to be refilled? (please list name of each medication and dose if known)     metoprolol succinate (TOPROL-XL) 25 MG 24    2. Which pharmacy/location (including street and city if local pharmacy) is medication to be sent to?   [facility]   3. Do they need a 30 day or 90 day supply?  [place]

## 2018-03-12 NOTE — Telephone Encounter (Signed)
RX sent

## 2018-03-16 ENCOUNTER — Other Ambulatory Visit (INDEPENDENT_AMBULATORY_CARE_PROVIDER_SITE_OTHER): Payer: PPO

## 2018-03-16 ENCOUNTER — Encounter: Payer: Self-pay | Admitting: Acute Care

## 2018-03-16 ENCOUNTER — Ambulatory Visit: Payer: PPO | Admitting: Acute Care

## 2018-03-16 VITALS — BP 110/72 | HR 64 | Ht 65.0 in | Wt 207.2 lb

## 2018-03-16 DIAGNOSIS — Z79899 Other long term (current) drug therapy: Secondary | ICD-10-CM | POA: Diagnosis not present

## 2018-03-16 DIAGNOSIS — J84112 Idiopathic pulmonary fibrosis: Secondary | ICD-10-CM

## 2018-03-16 DIAGNOSIS — R7989 Other specified abnormal findings of blood chemistry: Secondary | ICD-10-CM

## 2018-03-16 DIAGNOSIS — G4733 Obstructive sleep apnea (adult) (pediatric): Secondary | ICD-10-CM | POA: Diagnosis not present

## 2018-03-16 DIAGNOSIS — R0683 Snoring: Secondary | ICD-10-CM

## 2018-03-16 DIAGNOSIS — I2721 Secondary pulmonary arterial hypertension: Secondary | ICD-10-CM

## 2018-03-16 DIAGNOSIS — R945 Abnormal results of liver function studies: Secondary | ICD-10-CM | POA: Diagnosis not present

## 2018-03-16 DIAGNOSIS — J9611 Chronic respiratory failure with hypoxia: Secondary | ICD-10-CM

## 2018-03-16 DIAGNOSIS — F17200 Nicotine dependence, unspecified, uncomplicated: Secondary | ICD-10-CM

## 2018-03-16 DIAGNOSIS — E440 Moderate protein-calorie malnutrition: Secondary | ICD-10-CM

## 2018-03-16 DIAGNOSIS — E041 Nontoxic single thyroid nodule: Secondary | ICD-10-CM

## 2018-03-16 DIAGNOSIS — J449 Chronic obstructive pulmonary disease, unspecified: Secondary | ICD-10-CM

## 2018-03-16 DIAGNOSIS — E782 Mixed hyperlipidemia: Secondary | ICD-10-CM | POA: Diagnosis not present

## 2018-03-16 LAB — COMPREHENSIVE METABOLIC PANEL
ALK PHOS: 86 U/L (ref 39–117)
ALT: 13 U/L (ref 0–35)
AST: 15 U/L (ref 0–37)
Albumin: 3.9 g/dL (ref 3.5–5.2)
BILIRUBIN TOTAL: 0.6 mg/dL (ref 0.2–1.2)
BUN: 20 mg/dL (ref 6–23)
CALCIUM: 9.3 mg/dL (ref 8.4–10.5)
CO2: 23 meq/L (ref 19–32)
Chloride: 106 mEq/L (ref 96–112)
Creatinine, Ser: 0.82 mg/dL (ref 0.40–1.20)
GFR: 71.51 mL/min (ref >60.00)
Glucose, Bld: 102 mg/dL — ABNORMAL HIGH (ref 70–99)
Potassium: 4.3 mEq/L (ref 3.5–5.1)
Sodium: 138 mEq/L (ref 135–145)
Total Protein: 7.2 g/dL (ref 6.0–8.3)

## 2018-03-16 LAB — HEPATIC FUNCTION PANEL
ALK PHOS: 86 U/L (ref 39–117)
ALT: 13 U/L (ref 0–35)
AST: 15 U/L (ref 0–37)
Albumin: 3.9 g/dL (ref 3.5–5.2)
BILIRUBIN DIRECT: 0.2 mg/dL (ref 0.0–0.3)
TOTAL PROTEIN: 7.2 g/dL (ref 6.0–8.3)
Total Bilirubin: 0.6 mg/dL (ref 0.2–1.2)

## 2018-03-16 NOTE — Assessment & Plan Note (Addendum)
More compliant with Lasix Weight down 12 pounds since last visit Plan: Continue Lasix daily Low salt diet Maintain sats 88 to 92% at all times Wear oxygen at 2 L with continuous oxygen and 3 to 4 L with pulsed oxygen. Home Sleep test today to evaluate for sleep apnea. We will call you with results Follow up with pulmonary rehab Follow up with cardiology Follow up with Dr. [place] Bells in 2 months. Please contact office for sooner follow up if symptoms do not improve or worsen or seek emergency care

## 2018-03-16 NOTE — Assessment & Plan Note (Addendum)
Pt.is not 100 % compliant with oxygen use Plan: Education provided explained how hypoxemia causes progression of PAH It is imperative that you use your oxygen at all times. Saturation goals are 88-92% We do not want you to drop you saturation below 88%. Please wear your oxygen at 2 L nasal cannula when you are using continuous oxygen When you are wearing the pulsed oxygen you need to wear your oxygen at 3 to 4 L nasal cannula to maintain saturations  88% - 92 % We will re-order the POC that was ordered 1 month ago. If [facility] does not provide the device you have been approved for we will try another DME. Follow up with  Pulmonary rehab at [facility]) Follow up with Dr. Lake Bells or Judson Roch in 1 month. Please contact office for sooner follow up if symptoms do not improve or worsen or seek emergency care

## 2018-03-16 NOTE — Assessment & Plan Note (Addendum)
Likes addition of Symbicort Non-compliant with oxygen 24/7 Plan: Congratulations on staying smoke free. We will call you with the results of your labs drawn today. [facility] has not provided the portable oxygen tank. Please place another order. Please order humidification for home oxygen. We will send in a prescription for Symbicort 2 puffs twice daily. Rinse mouth after use. Maintain sats 88 to 92% Wear oxygen at 2 L with continuous oxygen and 3 to 4 L with pulsed oxygen. Nasal saline as needed for dry sinuses. Follow up with Dr. Lake Bells in 2 months. Please contact office for sooner follow up if symptoms do not improve or worsen or seek emergency care

## 2018-03-16 NOTE — Assessment & Plan Note (Addendum)
Continue Boost meal supplements Low salt diet Avoid replacing meals with high sugar snacks

## 2018-03-16 NOTE — Assessment & Plan Note (Addendum)
Appointment this week , 03/19/2018, with endocrinology for possible biopsy of nodule

## 2018-03-16 NOTE — Patient Instructions (Addendum)
It is good to see you today. Congratulations on staying smoke free. Your weight is down 12 pounds since your last OV. We will call you with the results of your labs drawn today. [facility] has not provided the portable oxygen tank. Please place another order. Please order humidification for home oxygen. Continue OFEV at 100 mg twice daily for your pulmonary fibrosis.. Remember to take your Lasix every day without fail. Remember to eat a low salt diet. We will send in a prescription for Symbicort 2 puffs twice daily. Rinse mouth after use. Maintain sats 88 to 92% Wear oxygen at 2 L with continuous oxygen and 3 to 4 L with pulsed oxygen. Home Sleep test today to evaluate for sleep apnea. We will call you with results Follow up with Endocrinology Thursday for evaluation of Thyroid nodule/ and possible biopsy. Continue biotene and pop sicles for dry mouth. Nasal saline as needed for dry sinuses. Boost meal supplements for lack of appetite. Follow up with Dr. Lake Bells in 2 months. LFT's 05/07/2018 when you are seeing Dr. Aundra Dubin. ( Order will be placed) Please contact office for sooner follow up if symptoms do not improve or worsen or seek emergency care

## 2018-03-16 NOTE — Progress Notes (Signed)
History of Present Illness Shelley [name] is a 77 y.o. female former smoker quit 2019,  with COPD, PAH,CHF  and CAD. She is followed by Dr. Lake Bells  Synopsis: Referred in March 2019 by Dr. Aundra Dubin for COPD. She has pulmonary hypertension and CAD. She had cardiac stents placed in 2004 by Dr. Lia Foyer. She developed leg swelling in January 2019 and was found to have pulmonary hypertension. She also has a history of Crohn's disease. 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 She quit smoking in 2019.   03/16/2018  One month follow up. Pt. Was last seen 02/12/2018. At that time she was non-compliant with her oxygen, and requesting a different inhaler to try for her COPD. We started Symbicort , and encouraged use of her  Oxygen. She was non-compliant with her lasix, and had gained 19 pounds in a 5-6 week period. We asked he to take her lasix every day and follow up with cards for weight gain.She was referred to cardiac rehab. She was tolerating OFEV at 100 mg twice daily.She needs LFT's today for therapeutic drug monitoring.She has had decreased appetite and was encouraged to supplement meals.Her daughter was concerned about sleep apnea, so she was scheduled for a home sleep study, which has not yet been done. She was referred for biopsy of a thyroid nodule, which does not appear to have been done.  She presents today with her daughter.She states she is more compliant with her lasix , although she has not taken it today. She states she skips days when she has to leave the house. We discussed taking her lasix every day, when she returns from her outings.Her weight is down 12 pounds since the last OV, and her lower extremity edema is better. Per her daughter she is more compliant with her oxygen, but she is not wearing it at all times as prescribed. We discussed the need to wear her oxygen at all times to prevent progression of her PAH.She is compliant with her OFEV. LFT's were done today prior to the OV to  evaluate Liver function on OFEV. She states she is complaint with her Symbicort. She feels this works better for her than the Anoro. She is having issues with [facility] and the ability to fill her small portable oxygen tanks. The smaller POC that was ordered a month ago, and that her insurance has approved has not been delivered.She is using a very large tank today that I suspect may contribute to her opting not to use oxygen when leaving home.She continues to have a poor diet per her daughter. She is going to pick up her home sleep study machine today to evaluate for OSA. She is seeing an endocrinologist Thursday to evaluate her thyroid.She denies fever, chest pain, orthopnea or hemoptysis.   Test Results: LFT's 03/16/2018 Results for Shelley [name], Shelley [name] (MRN [id number]) as of 03/16/2018 22:44  Ref. Range 03/16/2018 13:55  Alkaline Phosphatase Latest Ref Range: 39 - 117 U/L 86  Albumin Latest Ref Range: 3.5 - 5.2 g/dL 3.9  AST Latest Ref Range: 0 - 37 U/L 15  ALT Latest Ref Range: 0 - 35 U/L 13  Total Protein Latest Ref Range: 6.0 - 8.3 g/dL 7.2  Total Bilirubin Latest Ref Range: 0.2 - 1.2 mg/dL 0.6    US Thyroid 12/2017 The approximately 2.0 cm nodule within the right lobe of the thyroid, likely correlating with the nodule seen on preceding chest CT, meets imaging criteria to recommend percutaneous sampling as clinically indicated.  Pt refused biopsy when called  1 month ago to set up procedure due to complexity of current medical issues   Chest imaging: CTA chest (1/19): No PE, air trapping noted, some centrilobular emphysema noted, some paraseptal emphysema noted, fibrotic change in the periphery and appears to be worse in the bases V/Q scan (3/19): No evidence for chronic PE. HRCT 10/2017: images independently reviewed: peripheral based recitulation, interlobular thickening and honeycombing worse in the bases, some traction bronchiectasis, some areas of preserved lung noted. Per  radiology probably UIP, possible fibrotic NSIP; by [facility] read this is consistent with UIP; paratracheal adenopathy, thyroid nodule 2 cm, cardiomegally   PFT: - PFTs (3/19): Ratio 63%, FEV1 1.28 L 61% predicted, total lung capacity 4.2 L 80% predicted, DLCO 8.10 31% predicted  Labs: 10/2017: ANA neg, SCL 70 neg, Anti-Jo-1 neg, Centromere neg, SSA/SSB neg, RF neg, HP panel neg  Path:  Echo: Echo (1/19) with EF 55-60%, mild LVH, mild MR, moderate RV dilation with moderately decreased systolic function, moderate TR, PASP 74 mmHg.   Heart Catheterization: - RHC (3/19): mean RA 2, PA 54/16 mean 28, mean PCWP 8, CI 2.55, PVR 3.9 WU.    CBC Latest Ref Rng & Units 10/30/2017 11/09/2014 08/10/2013  WBC 4.0 - 10.5 K/uL 5.9 6.6 7.2  Hemoglobin 12.0 - 15.0 g/dL 15.3(H) 13.1 12.5  Hematocrit 36.0 - 46.0 % 47.0(H) 38.5 37.6  Platelets 150 - 400 K/uL 206 236.0 235.0    BMP Latest Ref Rng & Units 03/16/2018 02/12/2018 11/19/2017  Glucose 70 - 99 mg/dL 102(H) 104(H) 92  BUN 6 - 23 mg/dL _0 Creatinine 0.40 - 1.20 mg/dL 0.82 0.77 0.71  Sodium 135 - 145 mEq/L 138 142 138  Potassium 3.5 - 5.1 mEq/L 4.3 4.2 4.0  Chloride 96 - 112 mEq/L 106 109 106  CO2 19 - 32 mEq/L _1 Calcium 8.4 - 10.5 mg/dL 9.3 9.3 9.5    BNP No results found for: BNP  ProBNP No results found for: PROBNP  PFT    Component Value Date/Time   FEV1PRE 1.15 10/29/2017 0824   FEV1POST 1.28 10/29/2017 0824   FVCPRE 1.86 10/29/2017 0824   FVCPOST 2.01 10/29/2017 0824   TLC 4.19 10/29/2017 0824   DLCOUNC 8.10 10/29/2017 0824   PREFEV1FVCRT 62 10/29/2017 0824   PSTFEV1FVCRT 63 10/29/2017 0824    No results found.   Past medical hx Past Medical History:  Diagnosis Date  . Anxiety   . Carotid artery disease ([place])    40-98% RICA and LICA 1/19 - Dr. Oneida Alar  . Cataract   . COPD (chronic obstructive pulmonary disease) ([place])   . Coronary atherosclerosis of native coronary artery    BMS to RCA 2004  .  Depression   . DVT (deep venous thrombosis) ([place])   . Essential hypertension, benign   . Fracture Left Great Toe  . Hepatic steatosis   . History of melanoma   . History of oral aphthous ulcers   . History of Salmonella gastroenteritis   . Hx of adenomatous colonic polyps   . Mixed hyperlipidemia   . Myocardial infarction ([place])    IMI 10/04  . Osteoarthritis   . Osteoporosis   . Pancreatitis   . Rectal bleeding   . Regional enteritis of large intestine ([place])   . Sinus polyp   . TIA (transient ischemic attack)   . Tubular adenoma of colon 08/2012     Social History   Tobacco Use  .  Smoking status: Former Smoker    Packs/day: 0.25    Years: 40.00    Pack years: 10.00    Types: Cigarettes    Start date: 06/02/1957    Last attempt to quit: 09/2017    Years since quitting: 0.4  . Smokeless tobacco: Never Used  Substance Use Topics  . Alcohol use: No    Alcohol/week: 0.0 oz  . Drug use: No    Ms.Abarca reports that she quit smoking about 5 months ago. Her smoking use included cigarettes. She started smoking about 60 years ago. She has a 10.00 pack-year smoking history. She has never used smokeless tobacco. She reports that she does not drink alcohol or use drugs.  Tobacco Cessation: Former smoker quit 09/2017  Past surgical hx, Family hx, Social hx all reviewed.  Current Outpatient Medications on File Prior to Visit  Medication Sig  . antiseptic oral rinse (BIOTENE) LIQD 15 mLs by Mouth Rinse route as needed for dry mouth.  Marland Kitchen aspirin 81 MG tablet Take 81 mg by mouth daily.    . budesonide-formoterol (SYMBICORT) 160-4.5 MCG/ACT inhaler Inhale 2 puffs into the lungs 2 (two) times daily.  . clopidogrel (PLAVIX) 75 MG tablet Take 1 tablet (75 mg total) by mouth daily.  . Cyanocobalamin (B-12 PO) Take 1 tablet by mouth See admin instructions. Take 1 capsule every day x 7 days every other week   . dicyclomine (BENTYL) 10 MG capsule Take 1 capsule (10 mg total) by mouth as  needed for spasms.  . ferrous sulfate 325 (65 FE) MG tablet Take 325 mg by mouth daily.  Marland Kitchen Fexofenadine HCl (ALLEGRA ALLERGY PO) Take 1 tablet by mouth daily as needed (for allergies).   Marland Kitchen FLUoxetine (PROZAC) 40 MG capsule Take 40 mg by mouth daily.  . folic acid (FOLVITE) 1 MG tablet Take 1 tablet (1 mg total) by mouth daily.  . furosemide (LASIX) 40 MG tablet Take 1 tablet (40 mg total) by mouth 2 (two) times daily. Take 40 mg twice daily for 1 week then take 40 mg in the morning and 20 mg in the evening  . gabapentin (NEURONTIN) 100 MG capsule Take 1 capsule (100 mg total) by mouth 2 (two) times daily.  Marland Kitchen HYDROcodone-acetaminophen (NORCO) 10-325 MG tablet Take 1 tablet by mouth every 6 (six) hours as needed for moderate pain.   . metoprolol succinate (TOPROL-XL) 25 MG 24 hr tablet Take 1 tablet (25 mg total) by mouth daily.  . Misc Natural Products (OSTEO BI-FLEX JOINT SHIELD PO) Take 1 capsule by mouth daily.  . Nintedanib (OFEV) 100 MG CAPS Take 100 mg by mouth 2 (two) times daily.  . nitroGLYCERIN (NITROSTAT) 0.4 MG SL tablet Place 1 tablet (0.4 mg total) under the tongue every 5 (five) minutes x 3 doses as needed. (Patient taking differently: Place 0.4 mg under the tongue every 5 (five) minutes x 3 doses as needed for chest pain. )  . Omega-3 Fatty Acids (FISH OIL) 1200 MG CAPS Take 1,200 mg by mouth daily.   . potassium chloride SA (K-DUR,KLOR-CON) 20 MEQ tablet Take 2 tablets (40 mEq total) by mouth daily.  . rosuvastatin (CRESTOR) 10 MG tablet Take 1 tablet (10 mg total) by mouth daily.  Marland Kitchen sulfaSALAzine (AZULFIDINE) 500 MG tablet Take 1 tablet (500 mg total) by mouth 2 (two) times daily.   No current facility-administered medications on file prior to visit.      Allergies  Allergen Reactions  . Doxycycline Other (See Comments)  Poss. Photosensitivity  . Mercaptopurine Other (See Comments)    REACTION: pancreatitis  . Simvastatin Other (See Comments)    Severe leg aches  .  Sulfasalazine   . Penicillins Rash and Other (See Comments)    Has patient had a PCN reaction causing immediate rash, facial/tongue/throat swelling, SOB or lightheadedness with hypotension: No Has patient had a PCN reaction causing severe rash involving mucus membranes or skin necrosis: No Has patient had a PCN reaction that required hospitalization: No Has patient had a PCN reaction occurring within the last 10 years: No If all of the above answers are "NO", then may proceed with Cephalosporin use.   . Sulfa Antibiotics Rash    Review Of Systems:  Constitutional:   No  weight loss, night sweats,  Fevers, chills,+ fatigue, or  lassitude.  HEENT:   No headaches,  Difficulty swallowing,  Tooth/dental problems, or  Sore throat,                No sneezing, itching, ear ache, nasal congestion, post nasal drip,   CV:  No chest pain,  Orthopnea, PND, 1+ swelling in lower extremities, No anasarca, dizziness, palpitations, syncope.   GI  No heartburn, indigestion, abdominal pain, nausea, vomiting, diarrhea, change in bowel habits, loss of appetite, bloody stools.   Resp: + shortness of breath with exertion less at rest.  No excess mucus, no productive cough,  No non-productive cough,  No coughing up of blood.  No change in color of mucus.  No wheezing.  No chest wall deformity  Skin: no rash or lesions.  GU: no dysuria, change in color of urine, no urgency or frequency.  No flank pain, no hematuria   MS:  No joint pain or swelling.  No decreased range of motion.  No back pain.  Psych:  No change in mood or affect. No depression or anxiety.  No memory loss.   Vital Signs BP 110/72 (BP Location: Left Arm, Cuff Size: Normal)   Pulse 64   Ht _0  (1.651 m)   Wt 207 lb 3.2 oz (94 kg)   SpO2 94%   BMI 34.48 kg/m    Physical Exam:  General- No distress,  A&Ox3, pleasant ENT: No sinus tenderness, TM clear, pale nasal mucosa, no oral exudate,no post nasal drip, no LAN Cardiac: S1, S2,  regular rate and rhythm, no murmur Chest: No wheeze/ rales/ dullness; no accessory muscle use, no nasal flaring, no sternal retractions, + crackles middle and lower  Lobes bilaterally Abd.: Soft Non-tender, ND, BS +, Body mass index is 34.48 kg/m. Ext: No clubbing cyanosis, 1+ BLE edema Neuro:  MAE x 4, A&O x 3, appropriate Skin: No rashes, warm and dry Psych: normal mood and behavior   Assessment/Plan  COPD without exacerbation ([place]) Likes addition of Symbicort Non-compliant with oxygen 24/7 Plan: Congratulations on staying smoke free. We will call you with the results of your labs drawn today. [facility] has not provided the portable oxygen tank. Please place another order. Please order humidification for home oxygen. We will send in a prescription for Symbicort 2 puffs twice daily. Rinse mouth after use. Maintain sats 88 to 92% Wear oxygen at 2 L with continuous oxygen and 3 to 4 L with pulsed oxygen. Nasal saline as needed for dry sinuses. Follow up with Dr. Lake Bells in 2 months. Please contact office for sooner follow up if symptoms do not improve or worsen or seek emergency care     Pulmonary artery  hypertension ([place]) More compliant with Lasix Weight down 12 pounds since last visit Plan: Continue Lasix daily Low salt diet Maintain sats 88 to 92% at all times Wear oxygen at 2 L with continuous oxygen and 3 to 4 L with pulsed oxygen. Home Sleep test today to evaluate for sleep apnea. We will call you with results Follow up with pulmonary rehab Follow up with cardiology Follow up with Dr. Lake Bells in 2 months. Please contact office for sooner follow up if symptoms do not improve or worsen or seek emergency care     TOBACCO ABUSE Remains smoke free Plan: I have spent 3 minutes counseling patient on maintaining smoking cessation this visit.  Chronic respiratory failure with hypoxia ([facility]) Pt.is not 100 % compliant with oxygen use Plan: Education provided  explained how hypoxemia causes progression of PAH It is imperative that you use your oxygen at all times. Saturation goals are 88-92% We do not want you to drop you saturation below 88%. Please wear your oxygen at 2 L nasal cannula when you are using continuous oxygen When you are wearing the pulsed oxygen you need to wear your oxygen at 3 to 4 L nasal cannula to maintain saturations  88% - 92 % We will re-order the POC that was ordered 1 month ago. If [facility] does not provide the device you have been approved for we will try another DME. Follow up [facility]  [facility] Follow up with Dr. Lake Bells or Judson Roch in 1 month. Please contact office for sooner follow up if symptoms do not improve or worsen or seek emergency care  IPF (idiopathic pulmonary fibrosis) ([place]) Compliant with OFEV at 100 mg BID LFT's stable No GI issues at present Plan: Continue OFEV at 100 mg twice daily for your pulmonary fibrosis. Boost meal supplements for lack of appetite. Follow up with Dr. Lake Bells in 2 months. LFT's 05/07/2018 when you are seeing Dr. Aundra Dubin. ( Order will be placed) Consider starting 150 mg BID at next visit  as she is tolerating well   Snoring Picking up home sleep test devise today to evaluate for OSA We will call patient with results of test and treat per results  Protein calorie malnutrition ([place]) Continue Boost meal supplements Low salt diet Avoid replacing meals with high sugar snacks  Thyroid nodule Appointment this week , 03/19/2018, with endocrinology for possible biopsy of nodule    Magdalen Spatz, NP 03/16/2018  11:07 PM

## 2018-03-16 NOTE — Assessment & Plan Note (Signed)
Remains smoke free Plan: I have spent 3 minutes counseling patient on maintaining smoking cessation this visit.

## 2018-03-16 NOTE — Assessment & Plan Note (Signed)
Compliant with OFEV at 100 mg BID LFT's stable No GI issues at present Plan: Continue OFEV at 100 mg twice daily for your pulmonary fibrosis. Boost meal supplements for lack of appetite. Follow up with Dr. Lake Bells in 2 months. LFT's 05/07/2018 when you are seeing Dr. Aundra Dubin. ( Order will be placed) Consider starting 150 mg BID at next visit  as she is tolerating well

## 2018-03-16 NOTE — Assessment & Plan Note (Addendum)
Picking up home sleep test devise today to evaluate for OSA We will call patient with results of test and treat per results

## 2018-03-17 ENCOUNTER — Other Ambulatory Visit: Payer: Self-pay | Admitting: *Deleted

## 2018-03-17 DIAGNOSIS — G4733 Obstructive sleep apnea (adult) (pediatric): Secondary | ICD-10-CM | POA: Diagnosis not present

## 2018-03-18 ENCOUNTER — Ambulatory Visit: Payer: PPO | Admitting: "Endocrinology

## 2018-03-18 DIAGNOSIS — R2689 Other abnormalities of gait and mobility: Secondary | ICD-10-CM | POA: Diagnosis not present

## 2018-03-18 DIAGNOSIS — R0602 Shortness of breath: Secondary | ICD-10-CM | POA: Diagnosis not present

## 2018-03-18 DIAGNOSIS — I5032 Chronic diastolic (congestive) heart failure: Secondary | ICD-10-CM | POA: Diagnosis not present

## 2018-03-19 ENCOUNTER — Telehealth: Payer: Self-pay

## 2018-03-19 ENCOUNTER — Encounter: Payer: Self-pay | Admitting: "Endocrinology

## 2018-03-19 ENCOUNTER — Ambulatory Visit: Payer: PPO | Admitting: "Endocrinology

## 2018-03-19 VITALS — BP 117/70 | HR 65 | Ht 65.0 in | Wt 207.0 lb

## 2018-03-19 DIAGNOSIS — G4733 Obstructive sleep apnea (adult) (pediatric): Secondary | ICD-10-CM

## 2018-03-19 DIAGNOSIS — E042 Nontoxic multinodular goiter: Secondary | ICD-10-CM | POA: Insufficient documentation

## 2018-03-19 NOTE — Telephone Encounter (Signed)
lmtcb for pt to relay results/recs.   Will order cpap after speaking to pt.  Will route to [facility] to follow up on.

## 2018-03-19 NOTE — Telephone Encounter (Signed)
-----  Message from Len Blalock, [place] sent at 03/19/2018 11:33 AM EDT -----   ----- Message ----- From: Juanito Doom, MD Sent: 03/18/2018   8:16 AM To: Magdalen Spatz, NP, Len Blalock, CMA  A, Please let her know this showed obstructive sleep apnea and she needs to have an in lab CPAP titration study arranged.  Please order. Thanks, Ruby Cola ----- Message ----- From: Chesley Mires, MD Sent: 03/17/2018   6:16 PM To: Magdalen Spatz, NP, Juanito Doom, MD  Home sleep study from 03/16/18 >> moderate OSA.  AHI 27.8, SaO2 low 75%.  Spent 703.6 minutes of test time with SaO2 < 89%.  Should have in lab titration.  Thanks.  [place]

## 2018-03-19 NOTE — Progress Notes (Signed)
Endocrinology Consult Note                                            03/19/2018, 5:50 PM   Subjective:    Patient ID: Shelley [name], female    DOB: 1941-05-14, PCP [facility]   Past Medical History:  Diagnosis Date  . Anxiety   . Carotid artery disease ([place])    74-71% RICA and LICA 8/55 - Dr. Oneida Alar  . Cataract   . COPD (chronic obstructive pulmonary disease) ([place])   . Coronary atherosclerosis of native coronary artery    BMS to RCA 2004  . Depression   . DVT (deep venous thrombosis) ([place])   . Essential hypertension, benign   . Fracture Left Great Toe  . Hepatic steatosis   . History of melanoma   . History of oral aphthous ulcers   . History of Salmonella gastroenteritis   . Hx of adenomatous colonic polyps   . Mixed hyperlipidemia   . Myocardial infarction ([place])    IMI 10/04  . Osteoarthritis   . Osteoporosis   . Pancreatitis   . Rectal bleeding   . Regional enteritis of large intestine ([place])   . Sinus polyp   . TIA (transient ischemic attack)   . Tubular adenoma of colon 08/2012   Past Surgical History:  Procedure Laterality Date  . CATARACT EXTRACTION Bilateral   . Melanoma resection  1996  . RIGHT/LEFT HEART CATH AND CORONARY ANGIOGRAPHY N/A 10/30/2017   Procedure: RIGHT/LEFT HEART CATH AND CORONARY ANGIOGRAPHY;  Surgeon: Larey Dresser, MD;  Location: [facility];  Service: Cardiovascular;  Laterality: N/A;  . TOTAL KNEE ARTHROPLASTY Right Feb 2012   Social History   Socioeconomic History  . Marital status: Divorced    Spouse name: Not on file  . Number of children: 2  . Years of education: Not on file  . Highest education level: Not on file  Occupational History  . Occupation: Retired    Fish farm manager: RETIRED  Social Needs  . Financial resource strain: Not on file  . Food insecurity:    Worry: Not on file    Inability: Not on file  . Transportation needs:    Medical: Not on file    Non-medical: Not on file   Tobacco Use  . Smoking status: Former Smoker    Packs/day: 0.25    Years: 40.00    Pack years: 10.00    Types: Cigarettes    Start date: 06/02/1957    Last attempt to quit: 09/2017    Years since quitting: 0.4  . Smokeless tobacco: Never Used  Substance and Sexual Activity  . Alcohol use: No    Alcohol/week: 0.0 oz  . Drug use: No  . Sexual activity: Not on file  Lifestyle  . Physical activity:    Days per week: Not on file    Minutes per session: Not on file  . Stress: Not on file  Relationships  . Social connections:    Talks on phone: Not on file    Gets together: Not on file    Attends religious service: Not on file    Active member of club or organization: Not on file    Attends meetings of clubs or organizations: Not on file    Relationship status: Not on file  Other Topics Concern  . Not on file  Social History Narrative  . Not on file   Outpatient Encounter Medications as of 03/19/2018  Medication Sig  . antiseptic oral rinse (BIOTENE) LIQD 15 mLs by Mouth Rinse route as needed for dry mouth.  Marland Kitchen aspirin 81 MG tablet Take 81 mg by mouth daily.    . budesonide-formoterol (SYMBICORT) 160-4.5 MCG/ACT inhaler Inhale 2 puffs into the lungs 2 (two) times daily.  . clopidogrel (PLAVIX) 75 MG tablet Take 1 tablet (75 mg total) by mouth daily.  . Cyanocobalamin (B-12 PO) Take 1 tablet by mouth See admin instructions. Take 1 capsule every day x 7 days every other week   . dicyclomine (BENTYL) 10 MG capsule Take 1 capsule (10 mg total) by mouth as needed for spasms.  . ferrous sulfate 325 (65 FE) MG tablet Take 325 mg by mouth daily.  Marland Kitchen Fexofenadine HCl (ALLEGRA ALLERGY PO) Take 1 tablet by mouth daily as needed (for allergies).   Marland Kitchen FLUoxetine (PROZAC) 40 MG capsule Take 40 mg by mouth daily.  . folic acid (FOLVITE) 1 MG tablet Take 1 tablet (1 mg total) by mouth daily.  . furosemide (LASIX) 40 MG tablet Take 1 tablet (40 mg total) by mouth 2 (two) times daily. Take 40 mg  twice daily for 1 week then take 40 mg in the morning and 20 mg in the evening  . gabapentin (NEURONTIN) 100 MG capsule Take 1 capsule (100 mg total) by mouth 2 (two) times daily.  Marland Kitchen HYDROcodone-acetaminophen (NORCO) 10-325 MG tablet Take 1 tablet by mouth every 6 (six) hours as needed for moderate pain.   . metoprolol succinate (TOPROL-XL) 25 MG 24 hr tablet Take 1 tablet (25 mg total) by mouth daily.  . Misc Natural Products (OSTEO BI-FLEX JOINT SHIELD PO) Take 1 capsule by mouth daily.  . Nintedanib (OFEV) 100 MG CAPS Take 100 mg by mouth 2 (two) times daily.  . Omega-3 Fatty Acids (FISH OIL) 1200 MG CAPS Take 1,200 mg by mouth daily.   . potassium chloride SA (K-DUR,KLOR-CON) 20 MEQ tablet Take 2 tablets (40 mEq total) by mouth daily.  . rosuvastatin (CRESTOR) 10 MG tablet Take 1 tablet (10 mg total) by mouth daily.  Marland Kitchen sulfaSALAzine (AZULFIDINE) 500 MG tablet Take 1 tablet (500 mg total) by mouth 2 (two) times daily.  . nitroGLYCERIN (NITROSTAT) 0.4 MG SL tablet Place 1 tablet (0.4 mg total) under the tongue every 5 (five) minutes x 3 doses as needed. (Patient taking differently: Place 0.4 mg under the tongue every 5 (five) minutes x 3 doses as needed for chest pain. )   No facility-administered encounter medications on file as of 03/19/2018.    ALLERGIES: Allergies  Allergen Reactions  . Doxycycline Other (See Comments)    Poss. Photosensitivity  . Mercaptopurine Other (See Comments)    REACTION: pancreatitis  . Simvastatin Other (See Comments)    Severe leg aches  . Sulfasalazine   . Penicillins Rash and Other (See Comments)    Has patient had a PCN reaction causing immediate rash, facial/tongue/throat swelling, SOB or lightheadedness with hypotension: No Has patient had a PCN reaction causing severe rash involving mucus membranes or skin necrosis: No Has patient had a PCN reaction that required hospitalization: No Has patient had a PCN reaction occurring within the last 10 years:  No If all of the above answers are "NO", then may proceed with Cephalosporin use.   . Sulfa Antibiotics Rash    VACCINATION  STATUS: Immunization History  Administered Date(s) Administered  . Influenza,inj,Quad PF,6+ Mos 06/16/2017  . Influenza-Unspecified 06/14/2009, 06/09/2012, 05/28/2013, 05/11/2014, 06/15/2015  . Pneumococcal Conjugate-13 05/11/2014  . Tdap 06/09/2012  . Zoster 06/09/2012    HPI Shelley [name] is 77 y.o. female who presents today with a medical history as above. she is being seen in consultation for multinodular goiter requested by [facility], [facility].  -She denies any prior history of thyroid dysfunction.  She was found to have 2 cm nodule on chest CT in April 2019 which was subsequently confirmed on thyroid ultrasound on Jan 05, 2018 to show 2.0 cm nodule on the lower half of the right lobe of her thyroid which measures 4.9 cm; left lobe measuring 3.7 cm.  She denies any dysphagia, shortness of breath, odynophagia, or change in voice.  She has significant history of smoking anterior March 2019.  She was diagnosed with idiopathic pulmonary fibrosis in January 2019. -She denies radiation to neck.  She denies palpitations, tremors, nor heat intolerance.   Review of Systems  Constitutional: + On Ongoing oxygen supplement related to her pulmonary fibrosis, + fatigue, no subjective hyperthermia, no subjective hypothermia Eyes: no blurry vision, no xerophthalmia ENT: no sore throat, no nodules palpated in throat, no dysphagia/odynophagia, no hoarseness Cardiovascular: no Chest Pain, +Shortness of Breath, no palpitations, no leg swelling Respiratory: + cough, + SOB Gastrointestinal: no Nausea/Vomiting/Diarhhea Musculoskeletal: no muscle/joint aches Skin: no rashes Neurological: no tremors, no numbness, no tingling, no dizziness Psychiatric: no depression, no anxiety  Objective:    BP 117/70   Pulse 65   Ht _0  (1.651 m)   Wt 207 lb (93.9 kg)   BMI  34.45 kg/m   Wt Readings from Last 3 Encounters:  03/19/18 207 lb (93.9 kg)  03/16/18 207 lb 3.2 oz (94 kg)  03/05/18 202 lb 12.8 oz (92 kg)    Physical Exam  Constitutional: + Obese for height, + uses supplemental oxygen per nasal cannula, not in acute distress, normal state of mind Eyes: PERRLA, EOMI, no exophthalmos ENT: moist mucous membranes, + thyromegaly , no cervical lymphadenopathy Cardiovascular: normal precordial activity, Regular Rate and Rhythm, no Murmur/Rubs/Gallops Respiratory:  adequate breathing efforts, no gross chest deformity, Clear to auscultation bilaterally Gastrointestinal: abdomen soft, Non -tender, No distension, Bowel Sounds present Musculoskeletal: + Dependent edema, poor circulation  Skin: moist, warm, no rashes Neurological: no tremor with outstretched hands   CMP     Component Value Date/Time   NA 138 03/16/2018 1355   K 4.3 03/16/2018 1355   CL 106 03/16/2018 1355   CO2 23 03/16/2018 1355   GLUCOSE 102 (H) 03/16/2018 1355   BUN 20 03/16/2018 1355   CREATININE 0.82 03/16/2018 1355   CALCIUM 9.3 03/16/2018 1355   PROT 7.2 03/16/2018 1355   PROT 7.2 03/16/2018 1355   ALBUMIN 3.9 03/16/2018 1355   ALBUMIN 3.9 03/16/2018 1355   AST 15 03/16/2018 1355   AST 15 03/16/2018 1355   ALT 13 03/16/2018 1355   ALT 13 03/16/2018 1355   ALKPHOS 86 03/16/2018 1355   ALKPHOS 86 03/16/2018 1355   BILITOT 0.6 03/16/2018 1355   BILITOT 0.6 03/16/2018 1355   GFRNONAA >60 11/19/2017 1212   GFRAA >60 11/19/2017 1212    Lipid Panel ( most recent) Lipid Panel     Component Value Date/Time   CHOL 131 01/22/2018 1203   TRIG 73 01/22/2018 1203   HDL 64 01/22/2018 1203   CHOLHDL 2.0 01/22/2018 1203  VLDL 15 01/22/2018 1203   LDLCALC 52 01/22/2018 1203   Thyroid ultrasound on Jan 05, 2018 showed right lobe measuring 4.9 cm with 2 cm nodule; left lobe measuring 3.7 cm.   Assessment & Plan:   1. Multinodular goiter - I have reviewed her available  thyroid records and clinically evaluated the patient. - Based on reviews, she has multinodular goiter. -The largest nodule on the right lobe of her thyroid is 2 cm which needs further work-up to rule out malignancy.  She is a general high risk patient for surgery given her advanced pulmonary fibrosis which requires supplemental oxygen on a regular basis, accompanied by her 2 grown daughters, insisting that if necessary she will consider surgery.   -I discussed the next step being fine-needle aspiration of the moderately suspicious  nodule in the right lobe of the thyroid.   -She will be sent to radiology at [facility] for fine-needle aspiration.  She will be returning in 2 weeks to discuss results. -I did not initiate a new prescription today.  - I did not initiate any new prescriptions today. - I advised her  to maintain close follow up with her PCP at [facility] family practice for primary care needs.   - Time spent with the patient: 45 minutes, of which >50% was spent in obtaining information about her symptoms, reviewing her previous labs, evaluations, and treatments, counseling her about her thyroid function studies and thyroid nodules, and developing a plan to confirm the diagnosis and long term treatment as necessary.  Shelley [name] participated in the discussions, expressed understanding, and voiced agreement with the above plans.  All questions were answered to her satisfaction. she is encouraged to contact clinic should she have any questions or concerns prior to her return visit.  Follow up plan: Return in about 2 weeks (around 04/02/2018) for follow up with biopsy results.   Glade Lloyd, MD [facility] [address] [place], [place] [address] Phone: [phone]  Fax: [phone]     03/19/2018, 5:50 PM  This note was partially dictated with voice recognition software. Similar sounding words can be transcribed  inadequately or may not  be corrected upon review.

## 2018-03-20 MED ORDER — BUDESONIDE-FORMOTEROL FUMARATE 160-4.5 MCG/ACT IN AERO: 2.0000 | INHALATION_SPRAY | Freq: Two times a day (BID) | RESPIRATORY_TRACT | 5 refills | Status: DC

## 2018-03-20 NOTE — Telephone Encounter (Signed)
Pt is aware of results and voiced her understanding. Pt agreed to titration.  Order has been placed.  Pt also requested lab results.  Pt has been made aware of below lab results. Pt also stated that Rx for symbicort 160 was not received by pharmacy.  Rx for Symbicort 160 has been sent to preferred pharmacy. Nothing further is needed.    Notes recorded by Magdalen Spatz, NP on 03/20/2018 at 4:16 PM EDT Please let pt know her liver function tests and renal functions are fine. Have her follow the plan of care developed on the office at the last visit. Thanks so much.

## 2018-03-26 ENCOUNTER — Ambulatory Visit (HOSPITAL_COMMUNITY)
Admission: RE | Admit: 2018-03-26 | Discharge: 2018-03-26 | Disposition: A | Discharge status: Home / Self Care | Payer: PPO | Source: Ambulatory Visit | Attending: "Endocrinology | Admitting: "Endocrinology

## 2018-03-26 ENCOUNTER — Encounter (HOSPITAL_COMMUNITY): Payer: Self-pay

## 2018-03-26 DIAGNOSIS — E041 Nontoxic single thyroid nodule: Secondary | ICD-10-CM | POA: Diagnosis not present

## 2018-03-26 DIAGNOSIS — E042 Nontoxic multinodular goiter: Secondary | ICD-10-CM | POA: Insufficient documentation

## 2018-03-26 DIAGNOSIS — E079 Disorder of thyroid, unspecified: Secondary | ICD-10-CM | POA: Diagnosis not present

## 2018-03-26 MED ORDER — LIDOCAINE HCL (PF) 2 % IJ SOLN
INTRAMUSCULAR | Status: AC
  Filled 2018-03-26: qty 10

## 2018-03-26 NOTE — Procedures (Signed)
PreOperative Dx: RT thyroid nodule Postoperative Dx: RT thyroid nodule Procedure:   US guided FNA of RT thyroid nodule Radiologist:  Thornton Papas Anesthesia:  3 ml of 2% lidocaine Specimen:  FNA x 5  EBL:   < 1 ml Complications: None

## 2018-04-01 DIAGNOSIS — E042 Nontoxic multinodular goiter: Secondary | ICD-10-CM | POA: Diagnosis not present

## 2018-04-02 DIAGNOSIS — Z23 Encounter for immunization: Secondary | ICD-10-CM | POA: Diagnosis not present

## 2018-04-02 DIAGNOSIS — F329 Major depressive disorder, single episode, unspecified: Secondary | ICD-10-CM | POA: Diagnosis not present

## 2018-04-02 DIAGNOSIS — K5090 Crohn's disease, unspecified, without complications: Secondary | ICD-10-CM | POA: Diagnosis not present

## 2018-04-02 DIAGNOSIS — E079 Disorder of thyroid, unspecified: Secondary | ICD-10-CM | POA: Diagnosis not present

## 2018-04-02 DIAGNOSIS — E785 Hyperlipidemia, unspecified: Secondary | ICD-10-CM | POA: Diagnosis not present

## 2018-04-02 DIAGNOSIS — R7301 Impaired fasting glucose: Secondary | ICD-10-CM | POA: Diagnosis not present

## 2018-04-02 DIAGNOSIS — J84112 Idiopathic pulmonary fibrosis: Secondary | ICD-10-CM | POA: Diagnosis not present

## 2018-04-03 DIAGNOSIS — I5032 Chronic diastolic (congestive) heart failure: Secondary | ICD-10-CM | POA: Diagnosis not present

## 2018-04-03 DIAGNOSIS — R2689 Other abnormalities of gait and mobility: Secondary | ICD-10-CM | POA: Diagnosis not present

## 2018-04-03 DIAGNOSIS — R0602 Shortness of breath: Secondary | ICD-10-CM | POA: Diagnosis not present

## 2018-04-06 ENCOUNTER — Ambulatory Visit: Payer: PPO | Admitting: "Endocrinology

## 2018-04-06 DIAGNOSIS — Z1231 Encounter for screening mammogram for malignant neoplasm of breast: Secondary | ICD-10-CM | POA: Diagnosis not present

## 2018-04-06 DIAGNOSIS — Z803 Family history of malignant neoplasm of breast: Secondary | ICD-10-CM | POA: Diagnosis not present

## 2018-04-09 ENCOUNTER — Encounter: Payer: Self-pay | Admitting: "Endocrinology

## 2018-04-09 ENCOUNTER — Ambulatory Visit: Payer: PPO | Admitting: "Endocrinology

## 2018-04-09 VITALS — BP 131/75 | HR 66 | Ht 65.0 in | Wt 203.0 lb

## 2018-04-09 DIAGNOSIS — R899 Unspecified abnormal finding in specimens from other organs, systems and tissues: Secondary | ICD-10-CM

## 2018-04-09 DIAGNOSIS — E042 Nontoxic multinodular goiter: Secondary | ICD-10-CM | POA: Diagnosis not present

## 2018-04-09 NOTE — Progress Notes (Signed)
Endocrinology follow-up note                                            04/09/2018, 1:14 PM   Subjective:    Patient ID: Shelley [name], female    DOB: Feb 01, 1941, PCP Led[facility] Noss[facility]   Past Medical History:  Diagnosis Date  . Anxiety   . Carotid artery disease ([place])    96-04% RICA and LICA 5/40 - Dr. Oneida Alar  . Cataract   . COPD (chronic obstructive pulmonary disease) ([place])   . Coronary atherosclerosis of native coronary artery    BMS to RCA 2004  . Depression   . DVT (deep venous thrombosis) ([place])   . Essential hypertension, benign   . Fracture Left Great Toe  . Hepatic steatosis   . History of melanoma   . History of oral aphthous ulcers   . History of Salmonella gastroenteritis   . Hx of adenomatous colonic polyps   . Mixed hyperlipidemia   . Myocardial infarction ([place])    IMI 10/04  . Osteoarthritis   . Osteoporosis   . Pancreatitis   . Rectal bleeding   . Regional enteritis of large intestine ([place])   . Sinus polyp   . TIA (transient ischemic attack)   . Tubular adenoma of colon 08/2012   Past Surgical History:  Procedure Laterality Date  . CATARACT EXTRACTION Bilateral   . Melanoma resection  1996  . RIGHT/LEFT HEART CATH AND CORONARY ANGIOGRAPHY N/A 10/30/2017   Procedure: RIGHT/LEFT HEART CATH AND CORONARY ANGIOGRAPHY;  Surgeon: Larey Dresser, MD;  Location: [facility];  Service: Cardiovascular;  Laterality: N/A;  . TOTAL KNEE ARTHROPLASTY Right Feb 2012   Social History   Socioeconomic History  . Marital status: Divorced    Spouse name: Not on file  . Number of children: 2  . Years of education: Not on file  . Highest education level: Not on file  Occupational History  . Occupation: Retired    Fish farm manager: RETIRED  Social Needs  . Financial resource strain: Not on file  . Food insecurity:    Worry: Not on file    Inability: Not on file  . Transportation needs:    Medical: Not on file    Non-medical: Not on file   Tobacco Use  . Smoking status: Former Smoker    Packs/day: 0.25    Years: 40.00    Pack years: 10.00    Types: Cigarettes    Start date: 06/02/1957    Last attempt to quit: 09/2017    Years since quitting: 0.5  . Smokeless tobacco: Never Used  Substance and Sexual Activity  . Alcohol use: No    Alcohol/week: 0.0 standard drinks  . Drug use: No  . Sexual activity: Not on file  Lifestyle  . Physical activity:    Days per week: Not on file    Minutes per session: Not on file  . Stress: Not on file  Relationships  . Social connections:    Talks on phone: Not on file    Gets together: Not on file    Attends religious service: Not on file    Active member of club or organization: Not on file    Attends meetings of clubs or organizations: Not on file    Relationship status: Not on file  Other Topics Concern  . Not on file  Social History Narrative  . Not on file   Outpatient Encounter Medications as of 04/09/2018  Medication Sig  . antiseptic oral rinse (BIOTENE) LIQD 15 mLs by Mouth Rinse route as needed for dry mouth.  Marland Kitchen aspirin 81 MG tablet Take 81 mg by mouth daily.    . budesonide-formoterol (SYMBICORT) 160-4.5 MCG/ACT inhaler Inhale 2 puffs into the lungs 2 (two) times daily.  . clopidogrel (PLAVIX) 75 MG tablet Take 1 tablet (75 mg total) by mouth daily.  . Cyanocobalamin (B-12 PO) Take 1 tablet by mouth See admin instructions. Take 1 capsule every day x 7 days every other week   . dicyclomine (BENTYL) 10 MG capsule Take 1 capsule (10 mg total) by mouth as needed for spasms.  . ferrous sulfate 325 (65 FE) MG tablet Take 325 mg by mouth daily.  Marland Kitchen Fexofenadine HCl (ALLEGRA ALLERGY PO) Take 1 tablet by mouth daily as needed (for allergies).   Marland Kitchen FLUoxetine (PROZAC) 40 MG capsule Take 40 mg by mouth daily.  . folic acid (FOLVITE) 1 MG tablet Take 1 tablet (1 mg total) by mouth daily.  . furosemide (LASIX) 40 MG tablet Take 1 tablet (40 mg total) by mouth 2 (two) times daily.  Take 40 mg twice daily for 1 week then take 40 mg in the morning and 20 mg in the evening  . gabapentin (NEURONTIN) 100 MG capsule Take 1 capsule (100 mg total) by mouth 2 (two) times daily.  Marland Kitchen HYDROcodone-acetaminophen (NORCO) 10-325 MG tablet Take 1 tablet by mouth every 6 (six) hours as needed for moderate pain.   . metoprolol succinate (TOPROL-XL) 25 MG 24 hr tablet Take 1 tablet (25 mg total) by mouth daily.  . Misc Natural Products (OSTEO BI-FLEX JOINT SHIELD PO) Take 1 capsule by mouth daily.  . Nintedanib (OFEV) 100 MG CAPS Take 100 mg by mouth 2 (two) times daily.  . nitroGLYCERIN (NITROSTAT) 0.4 MG SL tablet Place 1 tablet (0.4 mg total) under the tongue every 5 (five) minutes x 3 doses as needed. (Patient taking differently: Place 0.4 mg under the tongue every 5 (five) minutes x 3 doses as needed for chest pain. )  . Omega-3 Fatty Acids (FISH OIL) 1200 MG CAPS Take 1,200 mg by mouth daily.   . potassium chloride SA (K-DUR,KLOR-CON) 20 MEQ tablet Take 2 tablets (40 mEq total) by mouth daily.  . rosuvastatin (CRESTOR) 10 MG tablet Take 1 tablet (10 mg total) by mouth daily.  Marland Kitchen sulfaSALAzine (AZULFIDINE) 500 MG tablet Take 1 tablet (500 mg total) by mouth 2 (two) times daily.   No facility-administered encounter medications on file as of 04/09/2018.    ALLERGIES: Allergies  Allergen Reactions  . Doxycycline Other (See Comments)    Poss. Photosensitivity  . Mercaptopurine Other (See Comments)    REACTION: pancreatitis  . Simvastatin Other (See Comments)    Severe leg aches  . Sulfasalazine   . Penicillins Rash and Other (See Comments)    Has patient had a PCN reaction causing immediate rash, facial/tongue/throat swelling, SOB or lightheadedness with hypotension: No Has patient had a PCN reaction causing severe rash involving mucus membranes or skin necrosis: No Has patient had a PCN reaction that required hospitalization: No Has patient had a PCN reaction occurring within the last  10 years: No If all of the above answers are "NO", then may proceed with Cephalosporin use.   . Sulfa Antibiotics Rash    VACCINATION  STATUS: Immunization History  Administered Date(s) Administered  . Influenza,inj,Quad PF,6+ Mos 06/16/2017  . Influenza-Unspecified 06/14/2009, 06/09/2012, 05/28/2013, 05/11/2014, 06/15/2015  . Pneumococcal Conjugate-13 05/11/2014  . Tdap 06/09/2012  . Zoster 06/09/2012    HPI Shelley [name] is 77 y.o. female who presents today with a medical history as above. she is returning with the results of her fine-needle aspiration after being seen in consultation for multinodular goiter requested by [facility].  -She denies any prior history of thyroid dysfunction.  She was found to have 2 cm nodule on chest CT in April 2019 which was subsequently confirmed on thyroid ultrasound on Jan 05, 2018 to show 2.0 cm nodule on the lower half of the right lobe of her thyroid which measures 4.9 cm; left lobe measuring 3.7 cm.  She underwent fine-needle aspiration on March 26, 2018 which resulted in atypia of undetermined significance.  A sample was sent for molecular study results are pending. She denies any dysphagia, shortness of breath, odynophagia, or change in voice.  She has significant history of smoking anterior March 2019.  She was diagnosed with idiopathic pulmonary fibrosis in January 2019. -She denies radiation to neck.  She denies palpitations, tremors, nor heat intolerance. -She has no new complaints today.   Review of Systems  Constitutional: + She is without her oxygen supplement this morning, she has history of pulmonary fibrosis which requires ongoing oxygen supplement.    + fatigue, no subjective hyperthermia, no subjective hypothermia Eyes: no blurry vision, no xerophthalmia ENT: no sore throat, no nodules palpated in throat, no dysphagia/odynophagia, no hoarseness Cardiovascular: no Chest Pain, +Shortness of Breath, no  palpitations, no leg swelling Respiratory: + cough, + SOB Gastrointestinal: no Nausea/Vomiting/Diarhhea Musculoskeletal: no muscle/joint aches Skin: no rashes Neurological: no tremors, no numbness, no tingling, no dizziness Psychiatric: no depression, no anxiety  Objective:    BP 131/75   Pulse 66   Ht _0  (1.651 m)   Wt 203 lb (92.1 kg)   BMI 33.78 kg/m   Wt Readings from Last 3 Encounters:  04/09/18 203 lb (92.1 kg)  03/19/18 207 lb (93.9 kg)  03/16/18 207 lb 3.2 oz (94 kg)    Physical Exam  Constitutional: + Obese for height, + uses a walker to get around, not using her oxygen supplement this morning, not in acute distress, normal state of mind.    Eyes: PERRLA, EOMI, no exophthalmos ENT: moist mucous membranes, + thyromegaly , no cervical lymphadenopathy Cardiovascular: normal precordial activity, Regular Rate and Rhythm, no Murmur/Rubs/Gallops Respiratory:  adequate breathing efforts, no gross chest deformity, Clear to auscultation bilaterally Gastrointestinal: abdomen soft, Non -tender, No distension, Bowel Sounds present Musculoskeletal: + Dependent edema, poor circulation  Skin: moist, warm, no rashes Neurological: no tremor with outstretched hands   CMP     Component Value Date/Time   NA 138 03/16/2018 1355   K 4.3 03/16/2018 1355   CL 106 03/16/2018 1355   CO2 23 03/16/2018 1355   GLUCOSE 102 (H) 03/16/2018 1355   BUN 20 03/16/2018 1355   CREATININE 0.82 03/16/2018 1355   CALCIUM 9.3 03/16/2018 1355   PROT 7.2 03/16/2018 1355   PROT 7.2 03/16/2018 1355   ALBUMIN 3.9 03/16/2018 1355   ALBUMIN 3.9 03/16/2018 1355   AST 15 03/16/2018 1355   AST 15 03/16/2018 1355   ALT 13 03/16/2018 1355   ALT 13 03/16/2018 1355   ALKPHOS 86 03/16/2018 1355   ALKPHOS 86 03/16/2018 1355   BILITOT 0.6 03/16/2018  1355   BILITOT 0.6 03/16/2018 1355   GFRNONAA >60 11/19/2017 1212   GFRAA >60 11/19/2017 1212    Lipid Panel ( most recent) Lipid Panel     Component  Value Date/Time   CHOL 131 01/22/2018 1203   TRIG 73 01/22/2018 1203   HDL 64 01/22/2018 1203   CHOLHDL 2.0 01/22/2018 1203   VLDL 15 01/22/2018 1203   LDLCALC 52 01/22/2018 1203   Thyroid ultrasound on Jan 05, 2018 showed right lobe measuring 4.9 cm with 2 cm nodule; left lobe measuring 3.7 cm.  Cytopathology report Diagnosis THYROID, FINE NEEDLE ASPIRATION, RIGHT(SPECIMEN 1 OF 1 COLLECTED 03/26/18): - ATYPIA OF UNDETERMINED SIGNIFICANCE OR FOLLICULAR LESION OF UNDETERMINED SIGNIFICANCE (BETHESDA CATEGORY III) COMMENT: THIS SPECIMEN WILL BE SENT FOR AFIRMA TESTING. Thressa Sheller MD Pathologist, Electronic Signature (Case signed 03/30/2018)  Assessment & Plan:   1. Multinodular goiter 2.  Atypia of undetermined significance  -She is returning for follow-up of her multinodular goiter status post fine-needle aspiration.  Her cytopathology report is remarkable for atypia of undetermined significance or follicular lesion of undetermined significance Bethesda category III. -This carries 5 -15% risk of malignancy. -Fortunately , a sample was sent for Afirma molecular studies.  This will take several weeks to return.  She will return in 5 to 6 weeks time to discuss results. -she  is  generally high risk for surgery given her advanced pulmonary fibrosis which requires ongoing portable oxygen supplement.  She made it clear however that if malignancy is confirmed,  she will consider surgery. -If molecular studies indicate a higher risk of malignancy, she will be referred to surgery for possible thyroidectomy.  -I did not initiate a new prescription today. - I advised her  to maintain close follow up with her PCP at [facility] for primary care needs.  Follow up plan: Return in about 6 weeks (around 05/21/2018), or Awaiting her Molecular Study Results.   Glade Lloyd, MD [facility] [address] [place], [place] [address] Phone:  [phone]  Fax: [phone]     04/09/2018, 1:14 PM  This note was partially dictated with voice recognition software. Similar sounding words can be transcribed inadequately or may not  be corrected upon review.

## 2018-04-15 ENCOUNTER — Encounter (HOSPITAL_COMMUNITY): Payer: Self-pay

## 2018-04-18 DIAGNOSIS — R0602 Shortness of breath: Secondary | ICD-10-CM | POA: Diagnosis not present

## 2018-04-18 DIAGNOSIS — I5032 Chronic diastolic (congestive) heart failure: Secondary | ICD-10-CM | POA: Diagnosis not present

## 2018-04-18 DIAGNOSIS — R2689 Other abnormalities of gait and mobility: Secondary | ICD-10-CM | POA: Diagnosis not present

## 2018-05-04 DIAGNOSIS — R2689 Other abnormalities of gait and mobility: Secondary | ICD-10-CM | POA: Diagnosis not present

## 2018-05-04 DIAGNOSIS — R0602 Shortness of breath: Secondary | ICD-10-CM | POA: Diagnosis not present

## 2018-05-04 DIAGNOSIS — I5032 Chronic diastolic (congestive) heart failure: Secondary | ICD-10-CM | POA: Diagnosis not present

## 2018-05-07 ENCOUNTER — Ambulatory Visit (HOSPITAL_COMMUNITY)
Admission: RE | Admit: 2018-05-07 | Discharge: 2018-05-07 | Disposition: A | Discharge status: Home / Self Care | Payer: PPO | Source: Ambulatory Visit | Attending: Cardiology | Admitting: Cardiology

## 2018-05-07 VITALS — BP 144/70 | HR 75 | Wt 207.0 lb

## 2018-05-07 DIAGNOSIS — I272 Pulmonary hypertension, unspecified: Secondary | ICD-10-CM

## 2018-05-07 DIAGNOSIS — E785 Hyperlipidemia, unspecified: Secondary | ICD-10-CM | POA: Diagnosis not present

## 2018-05-07 DIAGNOSIS — Z7982 Long term (current) use of aspirin: Secondary | ICD-10-CM | POA: Diagnosis not present

## 2018-05-07 DIAGNOSIS — Z8673 Personal history of transient ischemic attack (TIA), and cerebral infarction without residual deficits: Secondary | ICD-10-CM | POA: Diagnosis not present

## 2018-05-07 DIAGNOSIS — Z833 Family history of diabetes mellitus: Secondary | ICD-10-CM | POA: Diagnosis not present

## 2018-05-07 DIAGNOSIS — Z79899 Other long term (current) drug therapy: Secondary | ICD-10-CM | POA: Insufficient documentation

## 2018-05-07 DIAGNOSIS — I110 Hypertensive heart disease with heart failure: Secondary | ICD-10-CM | POA: Diagnosis not present

## 2018-05-07 DIAGNOSIS — I2721 Secondary pulmonary arterial hypertension: Secondary | ICD-10-CM | POA: Insufficient documentation

## 2018-05-07 DIAGNOSIS — Z86718 Personal history of other venous thrombosis and embolism: Secondary | ICD-10-CM | POA: Insufficient documentation

## 2018-05-07 DIAGNOSIS — I2510 Atherosclerotic heart disease of native coronary artery without angina pectoris: Secondary | ICD-10-CM | POA: Diagnosis not present

## 2018-05-07 DIAGNOSIS — F329 Major depressive disorder, single episode, unspecified: Secondary | ICD-10-CM | POA: Insufficient documentation

## 2018-05-07 DIAGNOSIS — Z85820 Personal history of malignant melanoma of skin: Secondary | ICD-10-CM | POA: Diagnosis not present

## 2018-05-07 DIAGNOSIS — I5032 Chronic diastolic (congestive) heart failure: Secondary | ICD-10-CM | POA: Diagnosis not present

## 2018-05-07 DIAGNOSIS — Z803 Family history of malignant neoplasm of breast: Secondary | ICD-10-CM | POA: Diagnosis not present

## 2018-05-07 DIAGNOSIS — Z8249 Family history of ischemic heart disease and other diseases of the circulatory system: Secondary | ICD-10-CM | POA: Diagnosis not present

## 2018-05-07 DIAGNOSIS — J84112 Idiopathic pulmonary fibrosis: Secondary | ICD-10-CM | POA: Diagnosis not present

## 2018-05-07 DIAGNOSIS — Z8049 Family history of malignant neoplasm of other genital organs: Secondary | ICD-10-CM | POA: Diagnosis not present

## 2018-05-07 DIAGNOSIS — I6523 Occlusion and stenosis of bilateral carotid arteries: Secondary | ICD-10-CM | POA: Insufficient documentation

## 2018-05-07 DIAGNOSIS — J449 Chronic obstructive pulmonary disease, unspecified: Secondary | ICD-10-CM | POA: Insufficient documentation

## 2018-05-07 DIAGNOSIS — Z7951 Long term (current) use of inhaled steroids: Secondary | ICD-10-CM | POA: Diagnosis not present

## 2018-05-07 DIAGNOSIS — Z7902 Long term (current) use of antithrombotics/antiplatelets: Secondary | ICD-10-CM | POA: Insufficient documentation

## 2018-05-07 DIAGNOSIS — K5090 Crohn's disease, unspecified, without complications: Secondary | ICD-10-CM | POA: Diagnosis not present

## 2018-05-07 DIAGNOSIS — I25119 Atherosclerotic heart disease of native coronary artery with unspecified angina pectoris: Secondary | ICD-10-CM

## 2018-05-07 DIAGNOSIS — I739 Peripheral vascular disease, unspecified: Secondary | ICD-10-CM | POA: Diagnosis not present

## 2018-05-07 DIAGNOSIS — Z955 Presence of coronary angioplasty implant and graft: Secondary | ICD-10-CM | POA: Diagnosis not present

## 2018-05-07 DIAGNOSIS — Z87891 Personal history of nicotine dependence: Secondary | ICD-10-CM | POA: Insufficient documentation

## 2018-05-07 LAB — BASIC METABOLIC PANEL
ANION GAP: 8 (ref 5–15)
BUN: 19 mg/dL (ref 8–23)
CALCIUM: 9.5 mg/dL (ref 8.9–10.3)
CO2: 23 mmol/L (ref 22–32)
Chloride: 107 mmol/L (ref 98–111)
Creatinine, Ser: 0.71 mg/dL (ref 0.44–1.00)
Glucose, Bld: 110 mg/dL — ABNORMAL HIGH (ref 70–99)
Potassium: 5 mmol/L (ref 3.5–5.1)
SODIUM: 138 mmol/L (ref 135–145)

## 2018-05-07 MED ORDER — FUROSEMIDE 40 MG PO TABS: 60.0000 mg | ORAL_TABLET | Freq: Every day | ORAL | 3 refills | Status: DC

## 2018-05-07 NOTE — Patient Instructions (Signed)
Labs today (will call for abnormal results, otherwise no news is good news)  START taking Lasix 60 mg (1.5 Tablets) Once Daily  Referral placed for Lipid Clinic for you to start Repatha, their office will contact you to schedule appointment.   Follow up in 3 months.

## 2018-05-07 NOTE — Progress Notes (Signed)
PCP: Dr. Scotty Court Cardiology: Dr. Domenic Polite HF Cardiology: Dr. Aundra Dubin  77 y.o. with history of prior DVT, CAD, HTN was referred by Dr. Domenic Polite for evaluation of pulmonary hypertension/RV failure.  Patient is a long-time smoker, quit in 3/19. In 1/19, she began to develop peripheral edema.  This gradually worsened.  She was given Lasix for 7 days with improvement, then edema returned and recently, Lasix was restarted at 40 mg daily.    In 3/19, I took her for right/left heart cath.  This showed 70% in-stent restenosis in the RCA, medically managed.  She had normal filling pressures on RHC with moderate pulmonary hypertension.  V/Q scan showed no chronic PE and PFTs showed moderate to severe obstructive defect.   She saw Dr. Lake Bells with pulmonary, and was diagnosed with both COPD and idiopathic pulmonary fibrosis.  She was started on Ofev.    She returns for followup of CHF and pulmonary hypertension.  She has home oxygen but is not very regular about using it.  She continues to have dyspnea with moderate exertion such as walking about 100 feet.  No chest pain.  She is also limited by claudication in calves.  She has been following for PAD with Dr. Oneida Alar, so far have planned medical management.  She is staying off cigarettes.  No lightheadedness/syncope. She is supposed to take Lasix 40 mg qam/20 qpm but generally only takes the am dose. Weight is up 8 lbs since last appointment. She has had myalgias with Crestor use, only on 10 mg daily.    ECG (personally reviewed): NSR, LBBB  Labs (1/19): K 3.8, creatinine 0.74 Labs (3/19): K 4.6, creatinine 0.87 Labs (5/19): LDL 52 Labs (7/19): K 4.3, creatinine 0.82  PMH: 1. CAD: BMS to RCA in 2004.  - Cardiolite (12/16): EF 52%, fixed inferior defect with no ischemia.  - LHC (3/19): 70% in-stent restenosis in RCA stent, managed medically.  2. Depression 3. H/o DVT 4. HTN 5. Osteoarthritis: h/o R TKR.  6. H/o melanoma 7. Crohns disease 8.  Hyperlipidemia 9. Carotid stenosis: Followed at [facility].  - Carotid dopplers (11/18): RICA 40-98% stenosis, LICA 11-91% stenosis.  10. COPD: No longer smokes. -  PFTs (3/19): moderate-severe obstruction consistent with COPD. 11. Pulmonary hypertension/RV failure: Suspect primarily group 3.  Echo (1/19) with EF 55-60%, mild LVH, mild MR, moderate RV dilation with moderately decreased systolic function, moderate TR, PASP 74 mmHg.  - CTA chest (1/19): No PE, chronic fibrotic changes in the lungs, 6 mm RUL nodule.  - RHC (3/19): mean RA 2, PA 54/16 mean 28, mean PCWP 8, CI 2.55, PVR 3.9 WU.  - PFTs (3/19): moderate-severe obstruction consistent with COPD.  - V/Q scan (3/19): No evidence for chronic PE.  12. TIA  13. PAD: Peripheral arterial dopplers (3/19) with totally occluded right AT, totally occluded left SFA, severe stenosis in left PT.  - Followed by Dr. Oneida Alar.  14. Idiopathic pulmonary fibrosis: Followed by Dr. Lake Bells, she is on Ofev.  15. LBBB 16. Multinodular goiter  Social History   Socioeconomic History  . Marital status: Divorced    Spouse name: Not on file  . Number of children: 2  . Years of education: Not on file  . Highest education level: Not on file  Occupational History  . Occupation: Retired    Fish farm manager: RETIRED  Social Needs  . Financial resource strain: Not on file  . Food insecurity:    Worry: Not on file    Inability: Not on file  .  Transportation needs:    Medical: Not on file    Non-medical: Not on file  Tobacco Use  . Smoking status: Former Smoker    Packs/day: 0.25    Years: 40.00    Pack years: 10.00    Types: Cigarettes    Start date: 06/02/1957    Last attempt to quit: 09/2017    Years since quitting: 0.6  . Smokeless tobacco: Never Used  Substance and Sexual Activity  . Alcohol use: No    Alcohol/week: 0.0 standard drinks  . Drug use: No  . Sexual activity: Not on file  Lifestyle  . Physical activity:    Days per week: Not on file     Minutes per session: Not on file  . Stress: Not on file  Relationships  . Social connections:    Talks on phone: Not on file    Gets together: Not on file    Attends religious service: Not on file    Active member of club or organization: Not on file    Attends meetings of clubs or organizations: Not on file    Relationship status: Not on file  . Intimate partner violence:    Fear of current or ex partner: Not on file    Emotionally abused: Not on file    Physically abused: Not on file    Forced sexual activity: Not on file  Other Topics Concern  . Not on file  Social History Narrative  . Not on file   Family History  Problem Relation Age of Onset  . Diabetes type II Mother   . Heart attack Mother 56       questionable  . Uterine cancer Mother   . Heart disease Mother        before age 79  . Varicose Veins Mother   . Other Brother        PVD and hx DVT  . Deep vein thrombosis Brother   . Breast cancer Sister   . Hyperlipidemia Sister   . Hypertension Sister   . Colon cancer Neg Hx    ROS: All systems reviewed and negative except as per HPI.  Current Outpatient Medications  Medication Sig Dispense Refill  . antiseptic oral rinse (BIOTENE) LIQD 15 mLs by Mouth Rinse route as needed for dry mouth. 1 Bottle 2  . aspirin 81 MG tablet Take 81 mg by mouth daily.      . budesonide-formoterol (SYMBICORT) 160-4.5 MCG/ACT inhaler Inhale 2 puffs into the lungs 2 (two) times daily. 1 Inhaler 5  . clopidogrel (PLAVIX) 75 MG tablet Take 1 tablet (75 mg total) by mouth daily. 30 tablet 6  . Cyanocobalamin (B-12 PO) Take 1 tablet by mouth See admin instructions. Take 1 capsule every day x 7 days every other week     . dicyclomine (BENTYL) 10 MG capsule Take 1 capsule (10 mg total) by mouth as needed for spasms. 30 capsule 3  . ferrous sulfate 325 (65 FE) MG tablet Take 325 mg by mouth daily.    Marland Kitchen Fexofenadine HCl (ALLEGRA ALLERGY PO) Take 1 tablet by mouth daily as needed (for  allergies).     Marland Kitchen FLUoxetine (PROZAC) 40 MG capsule Take 40 mg by mouth daily.    . folic acid (FOLVITE) 1 MG tablet Take 1 tablet (1 mg total) by mouth daily. 90 tablet 4  . furosemide (LASIX) 40 MG tablet Take 1.5 tablets (60 mg total) by mouth daily. 45 tablet 3  . gabapentin (  NEURONTIN) 100 MG capsule Take 1 capsule (100 mg total) by mouth 2 (two) times daily. 60 capsule 6  . HYDROcodone-acetaminophen (NORCO) 10-325 MG tablet Take 1 tablet by mouth every 6 (six) hours as needed for moderate pain.     . metoprolol succinate (TOPROL-XL) 25 MG 24 hr tablet Take 1 tablet (25 mg total) by mouth daily. 90 tablet 0  . Misc Natural Products (OSTEO BI-FLEX JOINT SHIELD PO) Take 1 capsule by mouth daily.    . Nintedanib (OFEV) 100 MG CAPS Take 100 mg by mouth 2 (two) times daily.    . nitroGLYCERIN (NITROSTAT) 0.4 MG SL tablet Place 1 tablet (0.4 mg total) under the tongue every 5 (five) minutes x 3 doses as needed. (Patient taking differently: Place 0.4 mg under the tongue every 5 (five) minutes x 3 doses as needed for chest pain. ) 25 tablet 3  . Omega-3 Fatty Acids (FISH OIL) 1200 MG CAPS Take 1,200 mg by mouth daily.     . potassium chloride SA (K-DUR,KLOR-CON) 20 MEQ tablet Take 2 tablets (40 mEq total) by mouth daily. 60 tablet 3  . rosuvastatin (CRESTOR) 10 MG tablet Take 1 tablet (10 mg total) by mouth daily. 30 tablet 3  . sulfaSALAzine (AZULFIDINE) 500 MG tablet Take 1 tablet (500 mg total) by mouth 2 (two) times daily. 60 tablet 6   No current facility-administered medications for this encounter.    BP (!) 144/70   Pulse 75   Wt 93.9 kg (207 lb)   SpO2 91%   BMI 34.45 kg/m  General: NAD Neck: JVP 8 cm, no thyromegaly or thyroid nodule.  Lungs: Clear to auscultation bilaterally with normal respiratory effort. CV: Nondisplaced PMI.  Heart regular S1/S2 with parodoxical S2 split, no S3/S4, no murmur.  No peripheral edema.  No carotid bruit.  Unable to palpate pedal pulses.  Abdomen:  Soft, nontender, no hepatosplenomegaly, no distention.  Skin: Intact without lesions or rashes.  Neurologic: Alert and oriented x 3.  Psych: Normal affect. Extremities: No clubbing or cyanosis.  HEENT: Normal.   Assessment/Plan: 1. Chronic diastolic CHF with prominent RV failure: Moderately dilated RV with moderately decreased systolic function on 8/11 echo.   [name] in 3/19 showed normal right and left heart filling pressures.   Weight is up today, NYHA class III symptoms.  Suspect this is from a combination of CHF, COPD and IPF.  She has mild volume overload on exam.  Not taking her pm Lasix generally.  - I will change Lasix to 60 mg daily.  BMET today.   - Wear compression stockings.  2. Pulmonary hypertension: PASP 74 mmHg on 1/19 echo with appearance of cor pulmonale.  CTA chest in 1/19 showed chronic fibrotic changes in the lungs but not emphysema, IPF diagnosed by Dr. Lake Bells.  However, PFTs in 3/19 were suggestive of moderate-severe obstruction consistent with COPD.  RHC (3/19) showed moderate pulmonary arterial hypertension.  V/Q scan showed no evidence for chronic pulmonary embolus.  My suspicion is that her pulmonary hypertension is primarily group 3 due to COPD and idiopathic pulmonary fibrosis.  - I am not inclined to treat with pulmonary vasodilators, Dr Lake Bells has agreed with this.   - Use home oxygen.  3. Pulmonary fibrosis: IPF.  She is now on Ofev.  She does not see much of a difference in her breathing. 4. CAD: h/o BMS to RCA in 2004.  LHC in 3/19 showed 70% in-stent restenosis in the RCA.  With no chest pain,  plan to treat medically.  - Continue ASA 81 and Plavix.  - She has had myalgias with Crestor 10 mg daily.  I am going to refer her to lipid clinic to see if she can get Repatha (has had myalgias with several statins).  5. Carotid stenosis: Followed by [facility], repeat carotid dopplers in 11/19 6. Active smoker: She has almost quit with Chantix.  I encouraged her today.  7. PAD:  Peripheral arterial dopplers (3/19) showed occluded left SFA.  She does seem to have significant claudication.  - She has followup with Dr. Oneida Alar to consider angiography.  - She has quit smoking.  8. COPD: She has now quit smoking.   Loralie Champagne 05/07/2018

## 2018-05-08 ENCOUNTER — Telehealth (HOSPITAL_COMMUNITY): Payer: Self-pay | Admitting: *Deleted

## 2018-05-08 DIAGNOSIS — I5032 Chronic diastolic (congestive) heart failure: Secondary | ICD-10-CM

## 2018-05-08 MED ORDER — POTASSIUM CHLORIDE CRYS ER 20 MEQ PO TBCR: 20.0000 meq | EXTENDED_RELEASE_TABLET | Freq: Every day | ORAL | 3 refills | Status: DC

## 2018-05-08 NOTE — Telephone Encounter (Signed)
Result Notes for Basic Metabolic Panel (BMET)   Notes recorded by Darron Doom, RN on 05/08/2018 at 10:03 AM EDT Spoke with patient, she's agreeable with plan. MAR updated, lab appt scheduled, and bmet ordered. ------  Notes recorded by Larey Dresser, MD on 05/07/2018 at 9:16 PM EDT Decrease KCl to 20 daily. BMET 10 days.

## 2018-05-11 ENCOUNTER — Telehealth (HOSPITAL_COMMUNITY): Payer: Self-pay | Admitting: *Deleted

## 2018-05-11 NOTE — Telephone Encounter (Signed)
Pt called to request if she can have labs done at [facility] next week instead of having to come to [place].  Advised she can go to [facility] on [address] in [place], she is agreeable, order faxed to them at [phone]

## 2018-05-18 ENCOUNTER — Other Ambulatory Visit (HOSPITAL_COMMUNITY): Payer: PPO

## 2018-05-18 DIAGNOSIS — I5032 Chronic diastolic (congestive) heart failure: Secondary | ICD-10-CM | POA: Diagnosis not present

## 2018-05-19 DIAGNOSIS — R0602 Shortness of breath: Secondary | ICD-10-CM | POA: Diagnosis not present

## 2018-05-19 DIAGNOSIS — I5032 Chronic diastolic (congestive) heart failure: Secondary | ICD-10-CM | POA: Diagnosis not present

## 2018-05-19 DIAGNOSIS — R2689 Other abnormalities of gait and mobility: Secondary | ICD-10-CM | POA: Diagnosis not present

## 2018-05-20 ENCOUNTER — Other Ambulatory Visit: Payer: Self-pay | Admitting: Cardiology

## 2018-05-21 ENCOUNTER — Ambulatory Visit: Payer: PPO | Admitting: Pulmonary Disease

## 2018-05-21 ENCOUNTER — Other Ambulatory Visit (HOSPITAL_COMMUNITY): Payer: Self-pay | Admitting: Cardiology

## 2018-05-22 NOTE — Progress Notes (Signed)
Cardiology Office Note  Date: 05/25/2018   ID: Shelley [name], DOB 12-18-1940, MRN [id number]  PCP: [facility]  Primary Cardiologist: Rozann Lesches, MD   Chief Complaint  Patient presents with  . Diastolic heart failure  . Coronary Artery Disease    History of Present Illness: Shelley [name] is a 77 y.o. female last seen in July.  She had interval follow-up just recently in the heart failure clinic with Dr. Aundra Dubin.  Lasix was changed to 60 mg daily at that point.  She was also referred to the lipid clinic for consideration of Repatha in light of statin intolerance.  She presents today for a routine visit.  She is using a walker, does not have oxygen on today.  She states that she has been using oxygen only intermittently, mainly when she is at home, although has been asked to use it continuously due to recurring hypoxic respiratory failure.  She continues to follow with Pulmonary.  She reports stable weights by her home scales, tolerating higher dose Lasix.  Follow-up lab work showed normalization of potassium on current supplement doses.  Reviewed these with her today.  I went over her last lipid panel, LDL looks good at 52.  She reports leg pain on Crestor, I asked her to go ahead and cut the dose to every other day for now.  She might be able to stay on statin therapy at lower dose with effective lipid management.  Past Medical History:  Diagnosis Date  . Anxiety   . Carotid artery disease ([place])    94-70% RICA and LICA 7/61 - Dr. Oneida Alar  . Cataract   . COPD (chronic obstructive pulmonary disease) ([place])   . Coronary atherosclerosis of native coronary artery    BMS to RCA 2004  . Depression   . DVT (deep venous thrombosis) ([place])   . Essential hypertension, benign   . Fracture Left Great Toe  . Hepatic steatosis   . History of melanoma   . History of oral aphthous ulcers   . History of Salmonella gastroenteritis   . Hx of adenomatous colonic polyps     . Mixed hyperlipidemia   . Myocardial infarction ([place])    IMI 10/04  . Osteoarthritis   . Osteoporosis   . Pancreatitis   . Rectal bleeding   . Regional enteritis of large intestine ([place])   . Sinus polyp   . TIA (transient ischemic attack)   . Tubular adenoma of colon 08/2012    Past Surgical History:  Procedure Laterality Date  . CATARACT EXTRACTION Bilateral   . Melanoma resection  1996  . RIGHT/LEFT HEART CATH AND CORONARY ANGIOGRAPHY N/A 10/30/2017   Procedure: RIGHT/LEFT HEART CATH AND CORONARY ANGIOGRAPHY;  Surgeon: Larey Dresser, MD;  Location: [facility];  Service: Cardiovascular;  Laterality: N/A;  . TOTAL KNEE ARTHROPLASTY Right Feb 2012    Current Outpatient Medications  Medication Sig Dispense Refill  . antiseptic oral rinse (BIOTENE) LIQD 15 mLs by Mouth Rinse route as needed for dry mouth. 1 Bottle 2  . aspirin 81 MG tablet Take 81 mg by mouth daily.      . budesonide-formoterol (SYMBICORT) 160-4.5 MCG/ACT inhaler Inhale 2 puffs into the lungs 2 (two) times daily. 1 Inhaler 5  . clopidogrel (PLAVIX) 75 MG tablet Take 1 tablet (75 mg total) by mouth daily. 30 tablet 6  . Cyanocobalamin (B-12 PO) Take 1 tablet by mouth See admin instructions. Take 1 capsule every  day x 7 days every other week     . dicyclomine (BENTYL) 10 MG capsule Take 1 capsule (10 mg total) by mouth as needed for spasms. 30 capsule 3  . ferrous sulfate 325 (65 FE) MG tablet Take 325 mg by mouth daily.    Marland Kitchen Fexofenadine HCl (ALLEGRA ALLERGY PO) Take 1 tablet by mouth daily as needed (for allergies).     Marland Kitchen FLUoxetine (PROZAC) 40 MG capsule Take 40 mg by mouth daily.    . folic acid (FOLVITE) 1 MG tablet Take 1 tablet (1 mg total) by mouth daily. 90 tablet 4  . furosemide (LASIX) 40 MG tablet Take 1.5 tablets (60 mg total) by mouth daily. 45 tablet 3  . gabapentin (NEURONTIN) 100 MG capsule Take 1 capsule (100 mg total) by mouth 2 (two) times daily. 60 capsule 6  . HYDROcodone-acetaminophen  (NORCO) 10-325 MG tablet Take 1 tablet by mouth every 6 (six) hours as needed for moderate pain.     . metoprolol succinate (TOPROL-XL) 25 MG 24 hr tablet TAKE 1 TABLET BY MOUTH EVERY DAY 90 tablet 2  . Misc Natural Products (OSTEO BI-FLEX JOINT SHIELD PO) Take 1 capsule by mouth daily.    . Nintedanib (OFEV) 100 MG CAPS Take 100 mg by mouth 2 (two) times daily.    . nitroGLYCERIN (NITROSTAT) 0.4 MG SL tablet Place 1 tablet (0.4 mg total) under the tongue every 5 (five) minutes x 3 doses as needed. (Patient taking differently: Place 0.4 mg under the tongue every 5 (five) minutes x 3 doses as needed for chest pain. ) 25 tablet 3  . Omega-3 Fatty Acids (FISH OIL) 1200 MG CAPS Take 1,200 mg by mouth daily.     . potassium chloride SA (K-DUR,KLOR-CON) 20 MEQ tablet Take 1 tablet (20 mEq total) by mouth daily. 30 tablet 3  . rosuvastatin (CRESTOR) 10 MG tablet Take 1 tablet (10 mg total) by mouth every other day.    . sulfaSALAzine (AZULFIDINE) 500 MG tablet Take 1 tablet (500 mg total) by mouth 2 (two) times daily. 60 tablet 6   No current facility-administered medications for this visit.    Allergies:  Doxycycline; Mercaptopurine; Simvastatin; Sulfasalazine; Penicillins; and Sulfa antibiotics   Social History: The patient  reports that she quit smoking about 7 months ago. Her smoking use included cigarettes. She started smoking about 61 years ago. She has a 10.00 pack-year smoking history. She has never used smokeless tobacco. She reports that she does not drink alcohol or use drugs.   ROS:  Please see the history of present illness. Otherwise, complete review of systems is positive for hearing loss.  All other systems are reviewed and negative.   Physical Exam: VS:  BP 138/68   Pulse 72   Ht _0  (1.651 m)   Wt 208 lb (94.3 kg)   SpO2 (!) 85%   BMI 34.61 kg/m , BMI Body mass index is 34.61 kg/m.  Wt Readings from Last 3 Encounters:  05/25/18 208 lb (94.3 kg)  05/07/18 207 lb (93.9 kg)    04/09/18 203 lb (92.1 kg)    General: Patient appears comfortable at rest.  Using a walker. HEENT: Conjunctiva and lids normal, oropharynx clear. Neck: Supple, no elevated JVP or carotid bruits, no thyromegaly. Lungs: No wheezing, nonlabored breathing at rest. Cardiac: Regular rate and rhythm, no S3 or significant systolic murmur. Abdomen: Soft, nontender, bowel sounds present. Extremities: Mild lower leg edema and venous stasis, distal pulses 1+. Skin: Warm  and dry. Musculoskeletal: No kyphosis. Neuropsychiatric: Alert and oriented x3, affect grossly appropriate.  ECG: I personally reviewed the tracing from 05/07/2018 which showed sinus rhythm with left bundle branch block.  Recent Labwork: 10/30/2017: Hemoglobin 15.3; Platelets 206 12/12/2017: TSH 1.30 03/16/2018: ALT 13; ALT 13; AST 15; AST 15 05/07/2018: BUN 19; Creatinine, Ser 0.71; Potassium 5.0; Sodium 138     Component Value Date/Time   CHOL 131 01/22/2018 1203   TRIG 73 01/22/2018 1203   HDL 64 01/22/2018 1203   CHOLHDL 2.0 01/22/2018 1203   VLDL 15 01/22/2018 1203   LDLCALC 52 01/22/2018 1203  September 2019: BUN 21, creatinine 0.67, potassium 4.1  Other Studies Reviewed Today:  Carotid Dopplers 01/22/2018: Final Interpretation: Right Carotid: Velocities in the right ICA are consistent with a 60-79%        stenosis.  Left Carotid: Velocities in the left ICA are consistent with a 40-59% stenosis.  Vertebrals: Bilateral vertebral arteries demonstrate antegrade flow. Subclavians: Normal flow hemodynamics were seen in bilateral subclavian       arteries.  Echocardiogram 09/24/2017: Study Conclusions  - Left ventricle: The cavity size was normal. Wall thickness was increased in a pattern of mild LVH. Systolic function was normal. The estimated ejection fraction was in the range of 55% to 60%. Doppler parameters are consistent with abnormal left ventricular relaxation (grade 1 diastolic  dysfunction). Doppler parameters are consistent with high ventricular filling pressure. - Aortic valve: Mildly calcified annulus. Trileaflet; mildly thickened leaflets. Valve area (VTI): 1.73 cm^2. Valve area (Vmax): 1.73 cm^2. Valve area (Vmean): 1.62 cm^2. - Mitral valve: There was mild regurgitation. - Right ventricle: The cavity size was moderately dilated. Systolic function was moderately reduced. TAPSE: 11.3 mm . Lateral annulus peak S velocity: 8.55 cm/s. - Right atrium: The atrium was moderately dilated. - Atrial septum: No defect or patent foramen ovale was identified. - Tricuspid valve: There was moderate regurgitation. - Pulmonary arteries: Systolic pressure was severely increased. PA peak pressure: 74 mm Hg (S). - Technically adequate study.  Left and right heart catheterization 10/30/2017: 1. Moderate pulmonary arterial hypertension, PVR not markedly elevated at 3.9 WU.  2. Low filling pressures.  3. 70% in-stent restenosis in proximal RCA.  Assessment and Plan:  1.  Chronic diastolic heart failure with RV failure and cor pulmonale.  Weights are relatively stable by home check, Lasix recently increased, follow-up lab work shows normalization of potassium and stable renal function.  Use compression stockings as before.  2.  Pulmonary hypertension with associated pulmonary fibrosis, currently followed by Dr. [place] Bells and on [place].  3.  CAD with history of BMS to the RCA in 2004.  She has 70% in-stent restenosis which is being managed medically without active angina.  Continue antiplatelet regimen and statin.  4.  Mixed hyperlipidemia, on Crestor 10 mg daily.  She does report leg myalgias, plan to cut the dose to every other day.  She also has a pending visit in the lipid clinic.  Current medicines were reviewed with the patient today.  Disposition: Follow-up in 6 months.  Signed, Satira Sark, MD, [facility] 05/25/2018 9:18 AM    [facility] [place], [place], [place] [address] Phone: [phone]; Fax: [phone]

## 2018-05-25 ENCOUNTER — Ambulatory Visit: Payer: PPO | Admitting: Cardiology

## 2018-05-25 ENCOUNTER — Encounter: Payer: Self-pay | Admitting: Cardiology

## 2018-05-25 VITALS — BP 138/68 | HR 72 | Ht 65.0 in | Wt 208.0 lb

## 2018-05-25 DIAGNOSIS — I272 Pulmonary hypertension, unspecified: Secondary | ICD-10-CM

## 2018-05-25 DIAGNOSIS — I25119 Atherosclerotic heart disease of native coronary artery with unspecified angina pectoris: Secondary | ICD-10-CM

## 2018-05-25 DIAGNOSIS — Z23 Encounter for immunization: Secondary | ICD-10-CM | POA: Diagnosis not present

## 2018-05-25 DIAGNOSIS — E782 Mixed hyperlipidemia: Secondary | ICD-10-CM

## 2018-05-25 DIAGNOSIS — I5032 Chronic diastolic (congestive) heart failure: Secondary | ICD-10-CM

## 2018-05-25 MED ORDER — ROSUVASTATIN CALCIUM 10 MG PO TABS: 10.0000 mg | ORAL_TABLET | ORAL | Status: DC

## 2018-05-25 NOTE — Patient Instructions (Signed)
Medication Instructions:   Decrease Crestor to every other day.   Continue all other current medications.  Labwork: none  Testing/Procedures: none  Follow-Up: Your physician wants you to follow up in: 6 months.  You will receive a reminder letter in the mail one-two months in advance.  If you don't receive a letter, please call our office to schedule the follow up appointment   Any Other Special Instructions Will Be Listed Below (If Applicable).  If you need a refill on your cardiac medications before your next appointment, please call your pharmacy.

## 2018-05-28 ENCOUNTER — Ambulatory Visit: Payer: PPO | Admitting: "Endocrinology

## 2018-05-28 ENCOUNTER — Encounter: Payer: Self-pay | Admitting: "Endocrinology

## 2018-05-28 VITALS — BP 117/69 | HR 60 | Ht 65.0 in | Wt 207.0 lb

## 2018-05-28 DIAGNOSIS — E042 Nontoxic multinodular goiter: Secondary | ICD-10-CM | POA: Diagnosis not present

## 2018-05-28 NOTE — Progress Notes (Signed)
Endocrinology follow-up note                                            05/28/2018, 1:03 PM   Subjective:    Patient ID: Shelley [name], female    DOB: [birth year], [facility]   Past Medical History:  Diagnosis Date  . Anxiety   . Carotid artery disease ([place])    81-15% RICA and LICA 7/26 - Dr. Oneida Alar  . Cataract   . COPD (chronic obstructive pulmonary disease) ([place])   . Coronary atherosclerosis of native coronary artery    BMS to RCA 2004  . Depression   . DVT (deep venous thrombosis) ([place])   . Essential hypertension, benign   . Fracture Left Great Toe  . Hepatic steatosis   . History of melanoma   . History of oral aphthous ulcers   . History of Salmonella gastroenteritis   . Hx of adenomatous colonic polyps   . Mixed hyperlipidemia   . Myocardial infarction ([place])    IMI 10/04  . Osteoarthritis   . Osteoporosis   . Pancreatitis   . Rectal bleeding   . Regional enteritis of large intestine ([place])   . Sinus polyp   . TIA (transient ischemic attack)   . Tubular adenoma of colon 08/2012   Past Surgical History:  Procedure Laterality Date  . CATARACT EXTRACTION Bilateral   . Melanoma resection  1996  . RIGHT/LEFT HEART CATH AND CORONARY ANGIOGRAPHY N/A 10/30/2017   Procedure: RIGHT/LEFT HEART CATH AND CORONARY ANGIOGRAPHY;  Surgeon: Larey Dresser, MD;  Location: [facility];  Service: Cardiovascular;  Laterality: N/A;  . TOTAL KNEE ARTHROPLASTY Right Feb 2012   Social History   Socioeconomic History  . Marital status: Divorced    Spouse name: Not on file  . Number of children: 2  . Years of education: Not on file  . Highest education level: Not on file  Occupational History  . Occupation: Retired    Fish farm manager: RETIRED  Social Needs  . Financial resource strain: Not on file  . Food insecurity:    Worry: Not on file    Inability: Not on file  . Transportation needs:    Medical: Not on file    Non-medical: Not on file   Tobacco Use  . Smoking status: Former Smoker    Packs/day: 0.25    Years: 40.00    Pack years: 10.00    Types: Cigarettes    Start date: 06/02/1957    Last attempt to quit: 09/2017    Years since quitting: 0.6  . Smokeless tobacco: Never Used  Substance and Sexual Activity  . Alcohol use: No    Alcohol/week: 0.0 standard drinks  . Drug use: No  . Sexual activity: Not on file  Lifestyle  . Physical activity:    Days per week: Not on file    Minutes per session: Not on file  . Stress: Not on file  Relationships  . Social connections:    Talks on phone: Not on file    Gets together: Not on file    Attends religious service: Not on file    Active member of club or organization: Not on file    Attends meetings of clubs or organizations: Not on file    Relationship status: Not on file  Other Topics Concern  . Not on file  Social History Narrative  . Not on file   Outpatient Encounter Medications as of 05/28/2018  Medication Sig  . antiseptic oral rinse (BIOTENE) LIQD 15 mLs by Mouth Rinse route as needed for dry mouth.  Marland Kitchen aspirin 81 MG tablet Take 81 mg by mouth daily.    . budesonide-formoterol (SYMBICORT) 160-4.5 MCG/ACT inhaler Inhale 2 puffs into the lungs 2 (two) times daily.  . clopidogrel (PLAVIX) 75 MG tablet Take 1 tablet (75 mg total) by mouth daily.  . Cyanocobalamin (B-12 PO) Take 1 tablet by mouth See admin instructions. Take 1 capsule every day x 7 days every other week   . dicyclomine (BENTYL) 10 MG capsule Take 1 capsule (10 mg total) by mouth as needed for spasms.  . ferrous sulfate 325 (65 FE) MG tablet Take 325 mg by mouth daily.  Marland Kitchen Fexofenadine HCl (ALLEGRA ALLERGY PO) Take 1 tablet by mouth daily as needed (for allergies).   Marland Kitchen FLUoxetine (PROZAC) 40 MG capsule Take 40 mg by mouth daily.  . folic acid (FOLVITE) 1 MG tablet Take 1 tablet (1 mg total) by mouth daily.  . furosemide (LASIX) 40 MG tablet Take 1.5 tablets (60 mg total) by mouth daily.  Marland Kitchen  gabapentin (NEURONTIN) 100 MG capsule Take 1 capsule (100 mg total) by mouth 2 (two) times daily.  Marland Kitchen HYDROcodone-acetaminophen (NORCO) 10-325 MG tablet Take 1 tablet by mouth every 6 (six) hours as needed for moderate pain.   . metoprolol succinate (TOPROL-XL) 25 MG 24 hr tablet TAKE 1 TABLET BY MOUTH EVERY DAY  . Misc Natural Products (OSTEO BI-FLEX JOINT SHIELD PO) Take 1 capsule by mouth daily.  . Nintedanib (OFEV) 100 MG CAPS Take 100 mg by mouth 2 (two) times daily.  . nitroGLYCERIN (NITROSTAT) 0.4 MG SL tablet Place 1 tablet (0.4 mg total) under the tongue every 5 (five) minutes x 3 doses as needed. (Patient taking differently: Place 0.4 mg under the tongue every 5 (five) minutes x 3 doses as needed for chest pain. )  . Omega-3 Fatty Acids (FISH OIL) 1200 MG CAPS Take 1,200 mg by mouth daily.   . potassium chloride SA (K-DUR,KLOR-CON) 20 MEQ tablet Take 1 tablet (20 mEq total) by mouth daily.  . rosuvastatin (CRESTOR) 10 MG tablet Take 1 tablet (10 mg total) by mouth every other day.  . sulfaSALAzine (AZULFIDINE) 500 MG tablet Take 1 tablet (500 mg total) by mouth 2 (two) times daily.   No facility-administered encounter medications on file as of 05/28/2018.    ALLERGIES: Allergies  Allergen Reactions  . Doxycycline Other (See Comments)    Poss. Photosensitivity  . Mercaptopurine Other (See Comments)    REACTION: pancreatitis  . Simvastatin Other (See Comments)    Severe leg aches  . Sulfasalazine   . Penicillins Rash and Other (See Comments)    Has patient had a PCN reaction causing immediate rash, facial/tongue/throat swelling, SOB or lightheadedness with hypotension: No Has patient had a PCN reaction causing severe rash involving mucus membranes or skin necrosis: No Has patient had a PCN reaction that required hospitalization: No Has patient had a PCN reaction occurring within the last 10 years: No If all of the above answers are "NO", then may proceed with Cephalosporin use.    . Sulfa Antibiotics Rash    VACCINATION STATUS: Immunization History  Administered Date(s) Administered  . Influenza,inj,Quad PF,6+ Mos 06/16/2017, 05/25/2018  . Influenza-Unspecified 06/14/2009, 06/09/2012, 05/28/2013, 05/11/2014, 06/15/2015  .  Pneumococcal Conjugate-13 05/11/2014  . Tdap 06/09/2012  . Zoster 06/09/2012    HPI Shelley [name] is 77 y.o. female who presents today with a medical history as above. she is returning with the results of her fine-needle aspiration after being seen in consultation for multinodular goiter requested by [facility], [facility].  -She denies any prior history of thyroid dysfunction.  She was found to have 2 cm nodule on chest CT in April 2019 which was subsequently confirmed on thyroid ultrasound on Jan 05, 2018 to show 2.0 cm nodule on the lower half of the right lobe of her thyroid which measures 4.9 cm; left lobe measuring 3.7 cm.  She underwent fine-needle aspiration on March 26, 2018 which resulted in atypia of undetermined significance.  A sample was sent for molecular study, revealing benign findings.  She denies any dysphagia, shortness of breath, odynophagia, or change in voice.  She has significant history of smoking until March 2019.  She was diagnosed with idiopathic pulmonary fibrosis in January 2019. -She denies radiation to neck.  She denies palpitations, tremors, nor heat intolerance. -She has no new complaints today.   Review of Systems  Constitutional: + She is without her oxygen supplement this morning, she has history of pulmonary fibrosis which requires ongoing oxygen supplement.    + fatigue, no subjective hyperthermia, no subjective hypothermia Eyes: no blurry vision, no xerophthalmia ENT: no sore throat, no nodules palpated in throat, no dysphagia/odynophagia, no hoarseness Cardiovascular: no Chest Pain, +Shortness of Breath, no palpitations, no leg swelling Respiratory: + cough, + SOB Gastrointestinal: no  Nausea/Vomiting/Diarhhea Musculoskeletal: no muscle/joint aches Skin: no rashes Neurological: no tremors, no numbness, no tingling, no dizziness Psychiatric: no depression, no anxiety  Objective:    BP 117/69   Pulse 60   Ht _0  (1.651 m)   Wt 207 lb (93.9 kg)   BMI 34.45 kg/m   Wt Readings from Last 3 Encounters:  05/28/18 207 lb (93.9 kg)  05/25/18 208 lb (94.3 kg)  05/07/18 207 lb (93.9 kg)    Physical Exam  Constitutional: + Obese for height, + uses a walker to get around, not using her oxygen supplement this morning, not in acute distress, normal state of mind.    Eyes: PERRLA, EOMI, no exophthalmos ENT: moist mucous membranes, + mild thyromegaly , no cervical lymphadenopathy Cardiovascular: normal precordial activity, Regular Rate and Rhythm, no Murmur/Rubs/Gallops Respiratory:  adequate breathing efforts, no gross chest deformity, Clear to auscultation bilaterally Gastrointestinal: abdomen soft, Non -tender, No distension, Bowel Sounds present Musculoskeletal: + Dependent edema, poor circulation  Skin: moist, warm, no rashes Neurological: no tremor with outstretched hands   CMP     Component Value Date/Time   NA 138 05/07/2018 1133   K 5.0 05/07/2018 1133   CL 107 05/07/2018 1133   CO2 23 05/07/2018 1133   GLUCOSE 110 (H) 05/07/2018 1133   BUN 19 05/07/2018 1133   CREATININE 0.71 05/07/2018 1133   CALCIUM 9.5 05/07/2018 1133   PROT 7.2 03/16/2018 1355   PROT 7.2 03/16/2018 1355   ALBUMIN 3.9 03/16/2018 1355   ALBUMIN 3.9 03/16/2018 1355   AST 15 03/16/2018 1355   AST 15 03/16/2018 1355   ALT 13 03/16/2018 1355   ALT 13 03/16/2018 1355   ALKPHOS 86 03/16/2018 1355   ALKPHOS 86 03/16/2018 1355   BILITOT 0.6 03/16/2018 1355   BILITOT 0.6 03/16/2018 1355   GFRNONAA >60 05/07/2018 1133   GFRAA >60 05/07/2018 1133    Lipid  Panel ( most recent) Lipid Panel     Component Value Date/Time   CHOL 131 01/22/2018 1203   TRIG 73 01/22/2018 1203   HDL 64  01/22/2018 1203   CHOLHDL 2.0 01/22/2018 1203   VLDL 15 01/22/2018 1203   LDLCALC 52 01/22/2018 1203   Thyroid ultrasound on Jan 05, 2018 showed right lobe measuring 4.9 cm with 2 cm nodule; left lobe measuring 3.7 cm.  Cytopathology report Diagnosis THYROID, FINE NEEDLE ASPIRATION, RIGHT(SPECIMEN 1 OF 1 COLLECTED 03/26/18): - ATYPIA OF UNDETERMINED SIGNIFICANCE OR FOLLICULAR LESION OF UNDETERMINED SIGNIFICANCE (BETHESDA CATEGORY III) COMMENT: THIS SPECIMEN WILL BE SENT FOR AFIRMA TESTING. Thressa Sheller MD Pathologist, Electronic Signature (Case signed 03/30/2018)  Afirma molecular study: Benign with negative malignancy classifies.      Assessment & Plan:   1. Multinodular goiter 2.  Atypia of undetermined significance  -She is returning for follow-up of her multinodular goiter status post fine-needle aspiration.  Her cytopathology report is remarkable for atypia of undetermined significance or follicular lesion of undetermined significance Bethesda category III, subsequent  Afirma molecular studies-reveal benign cytology with negative malignancy classifiers.   -She will not need thyroidectomy or surgical intervention at this time. -she  is  generally high risk for surgery given her advanced pulmonary fibrosis which requires ongoing portable oxygen supplement. -She will return in 1 year for physical exam with repeat thyroid function tests. -I did not initiate a new prescription today. - I advised her  to maintain close follow up with her PCP at [facility] for primary care needs.  Follow up plan: Return in about 1 year (around 05/29/2019) for Follow up with Pre-visit Labs.   Glade Lloyd, MD [facility] [address] [place], [place] [address] Phone: [phone]  Fax: [phone]     05/28/2018, 1:03 PM  This note was partially dictated with voice recognition software. Similar sounding words can be transcribed  inadequately or may not  be corrected upon review.

## 2018-06-08 ENCOUNTER — Ambulatory Visit: Payer: PPO | Admitting: Pulmonary Disease

## 2018-06-08 ENCOUNTER — Encounter: Payer: Self-pay | Admitting: Pulmonary Disease

## 2018-06-08 VITALS — BP 120/72 | HR 72 | Ht 65.16 in | Wt 206.0 lb

## 2018-06-08 DIAGNOSIS — G4733 Obstructive sleep apnea (adult) (pediatric): Secondary | ICD-10-CM | POA: Diagnosis not present

## 2018-06-08 DIAGNOSIS — J84112 Idiopathic pulmonary fibrosis: Secondary | ICD-10-CM | POA: Diagnosis not present

## 2018-06-08 NOTE — Progress Notes (Signed)
Subjective:   PATIENT ID: Shelley [name] GENDER: female DOB: [birth year], MRN: [id number]  Synopsis: Referred in March 2019 by Dr. Aundra Dubin for COPD and a CT chest showing UIP.  She has pulmonary hypertension and CAD.  She had cardiac stents placed in 2004 by Dr. Lia Foyer.  She developed leg swelling in January 2019 and was found to have pulmonary hypertension.  She also has a history of Crohn's disease. 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  She quit smoking in 2019.  Ofev started 12/2017.   HPI  Chief Complaint  Patient presents with  . Follow-up    loose stool with urgency (happened twice)     This has been a stable interval for [facility].  She says that she did have 2 episodes of loose stools however over the last 2 weeks.  This happened suddenly.  She also notes some bloating and a bit of abdominal pain.  This is not associated with weight gain.  She has been taking a stable dose of Lasix and her weight is been stable at 206 to 207.  She continues to use her oxygen regularly.  She does have problems sleeping and she says that she is sleepy during the daytime.  She stopped taking Symbicort because she says it made her dizzy.  SHe has been bruising more over the last few weeks.      Past Medical History:  Diagnosis Date  . Anxiety   . Carotid artery disease ([place])    23-76% RICA and LICA 2/83 - Dr. Oneida Alar  . Cataract   . COPD (chronic obstructive pulmonary disease) ([place])   . Coronary atherosclerosis of native coronary artery    BMS to RCA 2004  . Depression   . DVT (deep venous thrombosis) ([place])   . Essential hypertension, benign   . Fracture Left Great Toe  . Hepatic steatosis   . History of melanoma   . History of oral aphthous ulcers   . History of Salmonella gastroenteritis   . Hx of adenomatous colonic polyps   . Mixed hyperlipidemia   . Myocardial infarction ([place])    IMI 10/04  . Osteoarthritis   . Osteoporosis   . Pancreatitis   . Rectal bleeding   . Regional  enteritis of large intestine ([place])   . Sinus polyp   . TIA (transient ischemic attack)   . Tubular adenoma of colon 08/2012        Review of Systems  Constitutional: Negative for chills, fever, malaise/fatigue and weight loss.  HENT: Negative for congestion, nosebleeds, sinus pain and sore throat.   Eyes: Negative for photophobia, pain and discharge.  Respiratory: Positive for cough, sputum production and shortness of breath. Negative for hemoptysis and wheezing.   Cardiovascular: Positive for leg swelling. Negative for chest pain, palpitations and orthopnea.  Gastrointestinal: Negative for abdominal pain, constipation, diarrhea, nausea and vomiting.  Genitourinary: Negative for dysuria, frequency, hematuria and urgency.  Musculoskeletal: Negative for back pain, joint pain, myalgias and neck pain.  Skin: Negative for itching and rash.  Neurological: Negative for tingling, tremors, sensory change, speech change, focal weakness, seizures, weakness and headaches.  Psychiatric/Behavioral: Negative for memory loss, substance abuse and suicidal ideas. The patient is not nervous/anxious.       Objective:  Physical Exam   Vitals:   06/08/18 0951  BP: 120/72  Pulse: 72  SpO2: 94%  Weight: 206 lb (93.4 kg)  Height: 5' 5.16" (1.655 m)   4LNC  Gen: chronically  ill ppearing HENT: OP clear, TM's clear, neck supple PULM: Crackles bases, normal percussion CV: RRR, no mgr, trace edema GI: BS+, soft, nontender Derm: no cyanosis or rash Psyche: normal mood and affect    CBC    Component Value Date/Time   WBC 5.9 10/30/2017 0747   RBC 4.76 10/30/2017 0747   HGB 15.3 (H) 10/30/2017 0747   HCT 47.0 (H) 10/30/2017 0747   PLT 206 10/30/2017 0747   MCV 98.7 10/30/2017 0747   MCH 32.1 10/30/2017 0747   MCHC 32.6 10/30/2017 0747   RDW 14.1 10/30/2017 0747   LYMPHSABS 1.5 11/09/2014 1231   MONOABS 0.6 11/09/2014 1231   EOSABS 0.2 11/09/2014 1231   BASOSABS 0.0 11/09/2014 1231      Chest imaging: CTA chest (1/19): No PE, air trapping noted, some centrilobular emphysema noted, some paraseptal emphysema noted, fibrotic change in the periphery and appears to be worse in the bases V/Q scan (3/19): No evidence for chronic PE.  HRCT 10/2017: images independently reviewed: peripheral based recitulation, interlobular thickening and honeycombing worse in the bases, some traction bronchiectasis, some areas of preserved lung noted. Per radiology probably UIP, possible fibrotic NSIP; by [facility] read this is consistent with UIP; paratracheal adenopathy, thyroid nodule 2 cm, cardiomegally   PFT: - PFTs (3/19): Ratio 63%, FEV1 1.28 L 61% predicted, total lung capacity 4.2 L 80% predicted, DLCO 8.10 31% predicted  Labs: 10/2017: ANA neg, SCL 70 neg, Anti-Jo-1 neg, Centromere neg, SSA/SSB neg, RF neg, HP panel neg  Path:  Echo:  Echo (1/19) with EF 55-60%, mild LVH, mild MR, moderate RV dilation with moderately decreased systolic function, moderate TR, PASP 74 mmHg.   Heart Catheterization: - RHC (3/19): mean RA 2, PA 54/16 mean 28, mean PCWP 8, CI 2.55, PVR 3.9 WU.  Sleep study: July 2019 home sleep test showed moderate obstructive sleep apnea with an AHI of 28, O2 saturation nadir 75%, 700 minutes below 89   Records from her July 2019 visit with our nurse practitioner in the pulmonary clinic reviewed where she had recently been started on Symbicort for COPD.  She noted compliance with Lasix and weight was down by 12 pounds.  Encouraged to continue to participate in pulmonary rehab and to use oxygen.  Ofev was continued at 100 mg twice a day.    Assessment & Plan:   No diagnosis found.  Discussion: Harue returns to clinic today for further management of her COPD, idiopathic pulmonary fibrosis, cystic fibrosis gene mutation, chronic respiratory failure with hypoxemia, and obstructive sleep apnea.  This is all in the setting of pulmonary hypertension.  She has [place] group 3 pulmonary hypertension and will not benefit from a pulmonary vasodilator.  She needs to continue using oxygen regularly.  We have a new diagnosis of obstructive sleep apnea and she needs to start using a CPAP machine at night.  She has not had too many side effects from Ofev, but I believe that the diarrhea and bloating are directly related to it so I am reluctant to increase the dose considering her Crohn's disease.  Idiopathic pulmonary fibrosis: Continue taking OFF 100 mg twice a day Liver function testing later this week when she sees the lipid clinic Lung function testing now 6-minute walk testing now  Chronic respiratory failure with hypoxemia: Continue using 3 L continuously at rest (continuous flow) and 4 L pulse with exertion Continue pulmonary rehab  New diagnosis of obstructive sleep apnea: CPAP auto titrating 5 to 20  cm of water prescribed now 2 L of oxygen bled into CPAP machine  COPD: From my standpoint it is okay to hold Symbicort Practice good hand hygiene Stay active Use albuterol as needed for chest tightness wheezing or shortness of breath  Follow-up in 2 to 3 months  Greater than 50% of this 30-minute visit spent face-to-face    Current Outpatient Medications:  .  antiseptic oral rinse (BIOTENE) LIQD, 15 mLs by Mouth Rinse route as needed for dry mouth., Disp: 1 Bottle, Rfl: 2 .  aspirin 81 MG tablet, Take 81 mg by mouth daily.  , Disp: , Rfl:  .  budesonide-formoterol (SYMBICORT) 160-4.5 MCG/ACT inhaler, Inhale 2 puffs into the lungs 2 (two) times daily., Disp: 1 Inhaler, Rfl: 5 .  clopidogrel (PLAVIX) 75 MG tablet, Take 1 tablet (75 mg total) by mouth daily., Disp: 30 tablet, Rfl: 6 .  Cyanocobalamin (B-12 PO), Take 1 tablet by mouth See admin instructions. Take 1 capsule every day x 7 days every other week , Disp: , Rfl:  .  dicyclomine (BENTYL) 10 MG capsule, Take 1 capsule (10 mg total) by mouth as needed for spasms., Disp:  30 capsule, Rfl: 3 .  ferrous sulfate 325 (65 FE) MG tablet, Take 325 mg by mouth daily., Disp: , Rfl:  .  Fexofenadine HCl (ALLEGRA ALLERGY PO), Take 1 tablet by mouth daily as needed (for allergies). , Disp: , Rfl:  .  FLUoxetine (PROZAC) 40 MG capsule, Take 40 mg by mouth daily., Disp: , Rfl:  .  folic acid (FOLVITE) 1 MG tablet, Take 1 tablet (1 mg total) by mouth daily., Disp: 90 tablet, Rfl: 4 .  furosemide (LASIX) 40 MG tablet, Take 1.5 tablets (60 mg total) by mouth daily., Disp: 45 tablet, Rfl: 3 .  gabapentin (NEURONTIN) 100 MG capsule, Take 1 capsule (100 mg total) by mouth 2 (two) times daily., Disp: 60 capsule, Rfl: 6 .  HYDROcodone-acetaminophen (NORCO) 10-325 MG tablet, Take 1 tablet by mouth every 6 (six) hours as needed for moderate pain. , Disp: , Rfl:  .  metoprolol succinate (TOPROL-XL) 25 MG 24 hr tablet, TAKE 1 TABLET BY MOUTH EVERY DAY, Disp: 90 tablet, Rfl: 2 .  Misc Natural Products (OSTEO BI-FLEX JOINT SHIELD PO), Take 1 capsule by mouth daily., Disp: , Rfl:  .  Nintedanib (OFEV) 100 MG CAPS, Take 100 mg by mouth 2 (two) times daily., Disp: , Rfl:  .  nitroGLYCERIN (NITROSTAT) 0.4 MG SL tablet, Place 1 tablet (0.4 mg total) under the tongue every 5 (five) minutes x 3 doses as needed. (Patient taking differently: Place 0.4 mg under the tongue every 5 (five) minutes x 3 doses as needed for chest pain. ), Disp: 25 tablet, Rfl: 3 .  Omega-3 Fatty Acids (FISH OIL) 1200 MG CAPS, Take 1,200 mg by mouth daily. , Disp: , Rfl:  .  potassium chloride SA (K-DUR,KLOR-CON) 20 MEQ tablet, Take 1 tablet (20 mEq total) by mouth daily., Disp: 30 tablet, Rfl: 3 .  rosuvastatin (CRESTOR) 10 MG tablet, Take 1 tablet (10 mg total) by mouth every other day., Disp: , Rfl:  .  sulfaSALAzine (AZULFIDINE) 500 MG tablet, Take 1 tablet (500 mg total) by mouth 2 (two) times daily., Disp: 60 tablet, Rfl: 6

## 2018-06-08 NOTE — Patient Instructions (Signed)
Idiopathic pulmonary fibrosis: Continue taking OFF 100 mg twice a day Liver function testing later this week when she sees the lipid clinic Lung function testing now 6-minute walk testing now  Chronic respiratory failure with hypoxemia: Continue using 3 L continuously at rest (continuous flow) and 4 L pulse with exertion Continue pulmonary rehab  New diagnosis of obstructive sleep apnea: CPAP auto titrating 5 to 20 cm of water prescribed now 2 L of oxygen bled into CPAP machine  COPD: From my standpoint it is okay to hold Symbicort Practice good hand hygiene Stay active Use albuterol as needed for chest tightness wheezing or shortness of breath  Follow-up in 2 to 3 months

## 2018-06-09 ENCOUNTER — Ambulatory Visit: Payer: PPO | Admitting: Cardiology

## 2018-06-09 ENCOUNTER — Telehealth: Payer: Self-pay | Admitting: Pulmonary Disease

## 2018-06-09 NOTE — Telephone Encounter (Signed)
Called and spoke with pt who stated she had to change the date for the lipid clinic from Thursday, 10/17 to 11/25 due to not having a way to get there.  Pt wants to know if she needs to have bloodwork done sooner than 11/25.  Dr. Lake Bells, please advise on this for pt. Thanks!

## 2018-06-10 NOTE — Telephone Encounter (Signed)
Spoke with pt. She is aware of Dr. Anastasia Pall response. Nothing further was needed.

## 2018-06-10 NOTE — Telephone Encounter (Signed)
No, she can have an LFT on 11/25

## 2018-06-11 ENCOUNTER — Ambulatory Visit: Payer: PPO

## 2018-06-18 DIAGNOSIS — I5032 Chronic diastolic (congestive) heart failure: Secondary | ICD-10-CM | POA: Diagnosis not present

## 2018-06-18 DIAGNOSIS — R2689 Other abnormalities of gait and mobility: Secondary | ICD-10-CM | POA: Diagnosis not present

## 2018-06-18 DIAGNOSIS — R0602 Shortness of breath: Secondary | ICD-10-CM | POA: Diagnosis not present

## 2018-06-22 DIAGNOSIS — G4733 Obstructive sleep apnea (adult) (pediatric): Secondary | ICD-10-CM | POA: Diagnosis not present

## 2018-06-22 DIAGNOSIS — I5032 Chronic diastolic (congestive) heart failure: Secondary | ICD-10-CM | POA: Diagnosis not present

## 2018-06-22 DIAGNOSIS — R2689 Other abnormalities of gait and mobility: Secondary | ICD-10-CM | POA: Diagnosis not present

## 2018-06-22 DIAGNOSIS — R0602 Shortness of breath: Secondary | ICD-10-CM | POA: Diagnosis not present

## 2018-06-29 ENCOUNTER — Other Ambulatory Visit: Payer: Self-pay | Admitting: *Deleted

## 2018-06-29 MED ORDER — GABAPENTIN 100 MG PO CAPS: 100.0000 mg | ORAL_CAPSULE | Freq: Two times a day (BID) | ORAL | 6 refills | Status: DC

## 2018-07-02 ENCOUNTER — Encounter (HOSPITAL_COMMUNITY): Payer: PPO

## 2018-07-02 ENCOUNTER — Ambulatory Visit: Payer: PPO | Admitting: Vascular Surgery

## 2018-07-16 ENCOUNTER — Ambulatory Visit: Payer: PPO

## 2018-07-19 DIAGNOSIS — I5032 Chronic diastolic (congestive) heart failure: Secondary | ICD-10-CM | POA: Diagnosis not present

## 2018-07-19 DIAGNOSIS — R2689 Other abnormalities of gait and mobility: Secondary | ICD-10-CM | POA: Diagnosis not present

## 2018-07-19 DIAGNOSIS — R0602 Shortness of breath: Secondary | ICD-10-CM | POA: Diagnosis not present

## 2018-07-19 NOTE — Progress Notes (Addendum)
Patient ID: Shelley [name]                 DOB: 13-Sep-1940                    MRN: [id number]     HPI: Shelley [name] is a 77 y.o. female patient referred to lipid clinic by Dr. Aundra Dubin. PMH is significant for prior DVT, CAD s/p DES RCA 2004, hx MI, HTN, and pulmonary HTN. Carotid dopplers (11/18) showed RICA 17-49% stenosis, LICA 44-96% stenosis. She was seen by Dr. Domenic Polite ~2 months ago who decreased her from rosuvastatin 10 mg daily to 10 mg every other day due to leg pain. She presents today in lipid clinic to follow up with other options.   Patient has history of statin intolerance. She reports continued leg pain despite decreasing rosuvastatin to 10 mg every other day. She reports that this pain will bother her when she's sleeping as well. She reports this pain has persisted for years. She was put on gabapentin to help rule out neuropathic nature of pain. She is uncertain if her arthritis could be contributing, pain seems to be diffuse, not localized to joints, and has occurred since initiation of statin. She tried rosuvastatin a few years ago to test if it was better tolerated than rosuvastatin, reported similar muscle pain when she trialed it for a few months, and was switched back to rosuvastatin.  Current Medications: rosuvastatin 34m every other day, fish oil 1200 mg daily  Intolerances: rosuvastatin 10 mg every other day - 20 mg daily (persistent leg pain), simvastatin (muscle pain)  Risk Factors: Right and left coronary artery stenosis, CAD s/p DES  LDL goal: < 759mdL  Diet: Breakfast: blueberry muffin, two bowls of cheerios; Lunch: skips; Dinner: chicken salad, tuna salad, chicken, (hardly eats beef). Doesn't eat out very often, and doesn't have too much of an appetite.  Exercise: Does house work (without oxygen), clDentistoxes. She will get out of breath sit down, and uses oxygen.  Family History: Mother (DM2, MI, heart disease before 60100died at 6478varicose  veins), Brother (PVD, hx DVT), Sister (breast Ca, HLD, HTN)  Social History: (-) tobacco, quit smoking in March (-) alcohol  Labs: 01/22/18: Tot 131, HDL 64, LDL 52, TG 73, VLDL 15 (Crestor 20 mg every 2-3 days) 11/19/17: Tot 172, HDL 65, LDL 92, TG 75, VLDL 15 (Crestor 10 mg daily)  Past Medical History:  Diagnosis Date  . Anxiety   . Carotid artery disease ([place]   6075-91%ICA and LICA 12/29/36 Dr. FiOneida Alar. Cataract   . COPD [place] disease) [place]  . Coronary atherosclerosis of native coronary artery    BMS to RCA 2004  . Depression   . [place]) [place]  . Essential hypertension, benign   . Fracture Left Great Toe  . Hepatic steatosis   . History of melanoma   . History of oral aphthous ulcers   . History of Salmonella gastroenteritis   . Hx of adenomatous colonic polyps   . Mixed hyperlipidemia   . Myocardial infarction ([place]   IMI 10/04  . Osteoarthritis   . Osteoporosis   . Pancreatitis   . Rectal bleeding   . Regional enteritis of large intestine ([place]  . Sinus polyp   . TIA (transient ischemic attack)   . Tubular adenoma of colon 08/2012    Current Outpatient Medications on File  Prior to Visit  Medication Sig Dispense Refill  . antiseptic oral rinse (BIOTENE) LIQD 15 mLs by Mouth Rinse route as needed for dry mouth. 1 Bottle 2  . aspirin 81 MG tablet Take 81 mg by mouth daily.      . budesonide-formoterol (SYMBICORT) 160-4.5 MCG/ACT inhaler Inhale 2 puffs into the lungs 2 (two) times daily. 1 Inhaler 5  . clopidogrel (PLAVIX) 75 MG tablet Take 1 tablet (75 mg total) by mouth daily. 30 tablet 6  . Cyanocobalamin (B-12 PO) Take 1 tablet by mouth See admin instructions. Take 1 capsule every day x 7 days every other week     . dicyclomine (BENTYL) 10 MG capsule Take 1 capsule (10 mg total) by mouth as needed for spasms. 30 capsule 3  . ferrous sulfate 325 (65 FE) MG tablet Take 325 mg by mouth daily.    Marland Kitchen Fexofenadine HCl  (ALLEGRA ALLERGY PO) Take 1 tablet by mouth daily as needed (for allergies).     Marland Kitchen FLUoxetine (PROZAC) 40 MG capsule Take 40 mg by mouth daily.    . folic acid (FOLVITE) 1 MG tablet Take 1 tablet (1 mg total) by mouth daily. 90 tablet 4  . furosemide (LASIX) 40 MG tablet Take 1.5 tablets (60 mg total) by mouth daily. 45 tablet 3  . gabapentin (NEURONTIN) 100 MG capsule Take 1 capsule (100 mg total) by mouth 2 (two) times daily. 60 capsule 6  . HYDROcodone-acetaminophen (NORCO) 10-325 MG tablet Take 1 tablet by mouth every 6 (six) hours as needed for moderate pain.     . metoprolol succinate (TOPROL-XL) 25 MG 24 hr tablet TAKE 1 TABLET BY MOUTH EVERY DAY 90 tablet 2  . Misc Natural Products (OSTEO BI-FLEX JOINT SHIELD PO) Take 1 capsule by mouth daily.    . Nintedanib (OFEV) 100 MG CAPS Take 100 mg by mouth 2 (two) times daily.    . nitroGLYCERIN (NITROSTAT) 0.4 MG SL tablet Place 1 tablet (0.4 mg total) under the tongue every 5 (five) minutes x 3 doses as needed. (Patient taking differently: Place 0.4 mg under the tongue every 5 (five) minutes x 3 doses as needed for chest pain. ) 25 tablet 3  . Omega-3 Fatty Acids (FISH OIL) 1200 MG CAPS Take 1,200 mg by mouth daily.     . potassium chloride SA (K-DUR,KLOR-CON) 20 MEQ tablet Take 1 tablet (20 mEq total) by mouth daily. 30 tablet 3  . rosuvastatin (CRESTOR) 10 MG tablet Take 1 tablet (10 mg total) by mouth every other day.    . sulfaSALAzine (AZULFIDINE) 500 MG tablet Take 1 tablet (500 mg total) by mouth 2 (two) times daily. 60 tablet 6   No current facility-administered medications on file prior to visit.     Allergies  Allergen Reactions  . Doxycycline Other (See Comments)    Poss. Photosensitivity  . Mercaptopurine Other (See Comments)    REACTION: pancreatitis  . Simvastatin Other (See Comments)    Severe leg aches  . Sulfasalazine   . Penicillins Rash and Other (See Comments)    Has patient had a PCN reaction causing immediate rash,  facial/tongue/throat swelling, SOB or lightheadedness with hypotension: No Has patient had a PCN reaction causing severe rash involving mucus membranes or skin necrosis: No Has patient had a PCN reaction that required hospitalization: No Has patient had a PCN reaction occurring within the last 10 years: No If all of the above answers are "NO", then may proceed with Cephalosporin use.   Marland Kitchen  Sulfa Antibiotics Rash    Assessment/Plan:  1. Hyperlipidemia - Patient's most recent LDL is at goal <70 at 50 when she was taking rosuvastatin 31m every other to every three days. Uncertain what LDL will be today on current regimen of rosuvastatin 10 mg every other day. Patient reports relatively healthy diet and tries to stay active but is limited by shortness of breath which she will utilize her oxygen for. Discussed options including ezetimibe, switching to atorvastatin or pravastatin to see if it's better tolerated, or start PCSK9 inhibitor. Will check lipids with direct LDL, as patient is not fasting, today to help guide therapy decision. Will follow up in lipid clinic prn pending final decision of therapy.  EClaiborne Billings PharmD PGY2 Cardiology Pharmacy Resident 07/20/2018 1:57 PM    Addendum 11/26: Direct LDL well controlled at 55 mg/dL on current dose of rosuvastatin 10 mg every other day. Discussed with patient, she is willing to continue taking rosuvastatin at current dose for as long as she can tolerate. Will not schedule follow up at this time, patient able to scheduled prn.

## 2018-07-20 ENCOUNTER — Ambulatory Visit (INDEPENDENT_AMBULATORY_CARE_PROVIDER_SITE_OTHER): Payer: PPO | Admitting: *Deleted

## 2018-07-20 ENCOUNTER — Telehealth: Payer: Self-pay | Admitting: Pulmonary Disease

## 2018-07-20 ENCOUNTER — Encounter (INDEPENDENT_AMBULATORY_CARE_PROVIDER_SITE_OTHER): Payer: Self-pay

## 2018-07-20 ENCOUNTER — Ambulatory Visit (INDEPENDENT_AMBULATORY_CARE_PROVIDER_SITE_OTHER): Payer: PPO | Admitting: Pharmacist

## 2018-07-20 ENCOUNTER — Ambulatory Visit: Payer: PPO

## 2018-07-20 DIAGNOSIS — G4733 Obstructive sleep apnea (adult) (pediatric): Secondary | ICD-10-CM

## 2018-07-20 DIAGNOSIS — J449 Chronic obstructive pulmonary disease, unspecified: Secondary | ICD-10-CM

## 2018-07-20 DIAGNOSIS — E782 Mixed hyperlipidemia: Secondary | ICD-10-CM | POA: Diagnosis not present

## 2018-07-20 NOTE — Telephone Encounter (Signed)
I'm OK with both, will need orders

## 2018-07-20 NOTE — Telephone Encounter (Signed)
Pt came into office today for 6 min walk She advised during her walk that she would like to know if she could have O2 mask to cover to mouth and nose as she is a mouth breather. Pt has hard time breathing through her nose when walking, her sats drop more with exertion. Also pt is requesting a new CPAP mask at night. She is having hard time with the mask leaking.  BQ please advise. Thank you.

## 2018-07-20 NOTE — Progress Notes (Signed)
SIX MIN WALK 07/20/2018 02/16/2018 02/12/2018 11/20/2017  Medications Aspirin 71m, Symbicort 160 2 puffs, plavix 710m Ferrous, sulfate, fexofenadine, prozac, folvite, lasix 4052mneurontin 100m63moprol 25mg61mev 100mg,53mh oil, potassium, crestor 10mg, 35mazulfidine 500mg al22mis morning prior to walk. - - -  Supplimental Oxygen during Test? (L/min) Yes Yes Yes No  O2 Flow Rate _0 -  Type Pulse Pulse Pulse -  Laps 3 - - -  Partial Lap (in Meters) 0 - - -  Baseline BP (sitting) 113/72 - - -  Baseline Heartrate 59 - - -  Baseline Dyspnea (Borg Scale) 5 - - -  Baseline Fatigue (Borg Scale) 7 - - -  Baseline SPO2 96 - - -  BP (sitting) 118/74 - - -  Heartrate 77 - - -  Dyspnea (Borg Scale) 7 - - -  Fatigue (Borg Scale) 7 - - -  SPO2 88 - - -  BP (sitting) 116/74 - - -  Heartrate 68 - - -  SPO2 91 - - -  Stopped or Paused before Six Minutes No - - -  Interpretation Leg pain - - -  Distance Completed 144 - - -  Tech Comments: Pt had increase SOB after 2nd lap, used a walker during the walk the entire time. Pt states that she is a mouth breather and has troubles with using O2 because she hardly breathes through her nose. patient maintained at 90% with 3 liters of o2 slow paced walk SOB noted.  pt walked a very slow pace with a walker.  did not walk laps 2 and 3 d/t dyspnea and leg weakness.

## 2018-07-20 NOTE — Patient Instructions (Addendum)
It was nice to meet you today.   We will be checking your cholesterol today to see where your LDL is. Depending on what your LDL is, we can determine our plan either adding on another oral medication ezetimibe, or starting PCSK9 inhibitor injection therapy, Repatha. We will follow up with your results tomorrow and the plan.  If you have any questions, please don't hesitate to call [phone].

## 2018-07-20 NOTE — Telephone Encounter (Signed)
Orders have been sent. Patient aware. Nothing further needed.

## 2018-07-21 ENCOUNTER — Encounter (HOSPITAL_COMMUNITY): Payer: Self-pay | Admitting: Cardiology

## 2018-07-21 LAB — LDL CHOLESTEROL, DIRECT: LDL DIRECT: 55 mg/dL (ref 0–99)

## 2018-07-21 LAB — LIPID PANEL
CHOLESTEROL TOTAL: 133 mg/dL (ref 100–199)
Chol/HDL Ratio: 2.1 ratio (ref 0.0–4.4)
HDL: 62 mg/dL (ref >39)
LDL Calculated: 56 mg/dL (ref 0–99)
Triglycerides: 73 mg/dL (ref 0–149)
VLDL CHOLESTEROL CAL: 15 mg/dL (ref 5–40)

## 2018-07-23 DIAGNOSIS — R2689 Other abnormalities of gait and mobility: Secondary | ICD-10-CM | POA: Diagnosis not present

## 2018-07-23 DIAGNOSIS — R0602 Shortness of breath: Secondary | ICD-10-CM | POA: Diagnosis not present

## 2018-07-23 DIAGNOSIS — I5032 Chronic diastolic (congestive) heart failure: Secondary | ICD-10-CM | POA: Diagnosis not present

## 2018-07-23 DIAGNOSIS — G4733 Obstructive sleep apnea (adult) (pediatric): Secondary | ICD-10-CM | POA: Diagnosis not present

## 2018-07-27 ENCOUNTER — Ambulatory Visit: Payer: PPO | Admitting: Pulmonary Disease

## 2018-07-27 ENCOUNTER — Ambulatory Visit: Payer: PPO

## 2018-07-27 ENCOUNTER — Ambulatory Visit (INDEPENDENT_AMBULATORY_CARE_PROVIDER_SITE_OTHER): Payer: PPO | Admitting: Pulmonary Disease

## 2018-07-27 ENCOUNTER — Encounter: Payer: Self-pay | Admitting: Pulmonary Disease

## 2018-07-27 VITALS — BP 110/80 | HR 63 | Ht 65.0 in | Wt 210.0 lb

## 2018-07-27 DIAGNOSIS — J849 Interstitial pulmonary disease, unspecified: Secondary | ICD-10-CM

## 2018-07-27 DIAGNOSIS — Z5181 Encounter for therapeutic drug level monitoring: Secondary | ICD-10-CM | POA: Diagnosis not present

## 2018-07-27 DIAGNOSIS — G4733 Obstructive sleep apnea (adult) (pediatric): Secondary | ICD-10-CM

## 2018-07-27 DIAGNOSIS — J84112 Idiopathic pulmonary fibrosis: Secondary | ICD-10-CM | POA: Diagnosis not present

## 2018-07-27 LAB — PULMONARY FUNCTION TEST
DL/VA % pred: 42 %
DL/VA: 2.07 ml/min/mmHg/L
DLCO unc % pred: 26 %
DLCO unc: 6.86 ml/min/mmHg
FEF 25-75 PRE: 1.21 L/s
FEF2575-%PRED-PRE: 79 %
FEV1-%Pred-Pre: 65 %
FEV1-PRE: 1.36 L
FEV1FVC-%Pred-Pre: 105 %
FEV6-%PRED-PRE: 66 %
FEV6-Pre: 1.75 L
FEV6FVC-%Pred-Pre: 105 %
FVC-%Pred-Pre: 62 %
FVC-Pre: 1.75 L
PRE FEV1/FVC RATIO: 78 %
Pre FEV6/FVC Ratio: 100 %
RV % PRED: 79 %
RV: 1.93 L
TLC % PRED: 72 %
TLC: 3.78 L

## 2018-07-27 LAB — CBC WITH DIFFERENTIAL/PLATELET
BASOS ABS: 0 10*3/uL (ref 0.0–0.1)
Basophils Relative: 0.5 % (ref 0.0–3.0)
Eosinophils Absolute: 0.1 10*3/uL (ref 0.0–0.7)
Eosinophils Relative: 2.2 % (ref 0.0–5.0)
HCT: 40.8 % (ref 36.0–46.0)
Hemoglobin: 13.4 g/dL (ref 12.0–15.0)
Lymphocytes Relative: 24.4 % (ref 12.0–46.0)
Lymphs Abs: 1.4 10*3/uL (ref 0.7–4.0)
MCHC: 32.7 g/dL (ref 30.0–36.0)
MCV: 103.3 fl — AB (ref 78.0–100.0)
Monocytes Absolute: 0.5 10*3/uL (ref 0.1–1.0)
Monocytes Relative: 9 % (ref 3.0–12.0)
Neutro Abs: 3.7 10*3/uL (ref 1.4–7.7)
Neutrophils Relative %: 63.9 % (ref 43.0–77.0)
Platelets: 203 10*3/uL (ref 150.0–400.0)
RBC: 3.95 Mil/uL (ref 3.87–5.11)
RDW: 14.8 % (ref 11.5–15.5)
WBC: 5.9 10*3/uL (ref 4.0–10.5)

## 2018-07-27 LAB — COMPREHENSIVE METABOLIC PANEL
ALT: 27 U/L (ref 0–35)
AST: 25 U/L (ref 0–37)
Albumin: 4 g/dL (ref 3.5–5.2)
Alkaline Phosphatase: 65 U/L (ref 39–117)
BILIRUBIN TOTAL: 0.8 mg/dL (ref 0.2–1.2)
BUN: 16 mg/dL (ref 6–23)
CO2: 28 mEq/L (ref 19–32)
Calcium: 9.5 mg/dL (ref 8.4–10.5)
Chloride: 104 mEq/L (ref 96–112)
Creatinine, Ser: 0.87 mg/dL (ref 0.40–1.20)
GFR: 66.73 mL/min (ref >60.00)
GLUCOSE: 96 mg/dL (ref 70–99)
Potassium: 4.1 mEq/L (ref 3.5–5.1)
Sodium: 140 mEq/L (ref 135–145)
Total Protein: 6.9 g/dL (ref 6.0–8.3)

## 2018-07-27 MED ORDER — ALBUTEROL SULFATE (2.5 MG/3ML) 0.083% IN NEBU: 2.5000 mg | INHALATION_SOLUTION | Freq: Four times a day (QID) | RESPIRATORY_TRACT | 11 refills | Status: DC | PRN

## 2018-07-27 NOTE — Progress Notes (Signed)
Subjective:   PATIENT ID: Shelley [name] GENDER: female DOB: 1941-01-03, MRN: [id number]  Synopsis: Referred in March 2019 by Dr. Aundra Dubin for COPD and a CT chest showing UIP.  She has pulmonary hypertension and CAD.  She had cardiac stents placed in 2004 by Dr. Lia Foyer.  She developed leg swelling in January 2019 and was found to have pulmonary hypertension.  She also has a history of Crohn's disease. 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  She quit smoking in 2019.  Ofev started 12/2017.   HPI  Chief Complaint  Patient presents with  . Follow-up    Pt had PFT prior to ov today.    [name] had a really hard time with her CPAP.  She says that the cord would tangle up around her and bother her.  The face mask didn't fit very well and slips all over her face.  Her DME company wasn't helpful with this.  She says that she doesn't have much of an appetite on Ofev and some days she won't take the nighttime dose if she can't eat anything with it.  (this doesn't happen too often, maybe once a week).   She says that she takes her oxygen off quite a bit during the daytime.  She says she does not feel short of breath when she does this.  However, she ends up wearing it for the majority of the day.  She does not take her Symbicort routinely.  She says that she does not have any albuterol to use.  Past Medical History:  Diagnosis Date  . Anxiety   . Carotid artery disease ([place])    50-35% RICA and LICA 4/65 - Dr. Oneida Alar  . Cataract   . COPD (chronic obstructive pulmonary disease) ([place])   . Coronary atherosclerosis of native coronary artery    BMS to RCA 2004  . Depression   . DVT (deep venous thrombosis) ([place])   . Essential hypertension, benign   . Fracture Left Great Toe  . Hepatic steatosis   . History of melanoma   . History of oral aphthous ulcers   . History of Salmonella gastroenteritis   . Hx of adenomatous colonic polyps   . Mixed hyperlipidemia   . Myocardial infarction ([place])      IMI 10/04  . Osteoarthritis   . Osteoporosis   . Pancreatitis   . Rectal bleeding   . Regional enteritis of large intestine ([place])   . Sinus polyp   . TIA (transient ischemic attack)   . Tubular adenoma of colon 08/2012        Review of Systems  Constitutional: Negative for chills, fever, malaise/fatigue and weight loss.  HENT: Negative for congestion, nosebleeds, sinus pain and sore throat.   Eyes: Negative for photophobia, pain and discharge.  Respiratory: Positive for cough, sputum production and shortness of breath. Negative for hemoptysis and wheezing.   Cardiovascular: Positive for leg swelling. Negative for chest pain, palpitations and orthopnea.  Gastrointestinal: Negative for abdominal pain, constipation, diarrhea, nausea and vomiting.  Genitourinary: Negative for dysuria, frequency, hematuria and urgency.  Musculoskeletal: Negative for back pain, joint pain, myalgias and neck pain.  Skin: Negative for itching and rash.  Neurological: Negative for tingling, tremors, sensory change, speech change, focal weakness, seizures, weakness and headaches.  Psychiatric/Behavioral: Negative for memory loss, substance abuse and suicidal ideas. The patient is not nervous/anxious.       Objective:  Physical Exam   Vitals:   07/27/18 1434  07/27/18 1451  BP: 114/76 110/80  Pulse: (!) 50 63  SpO2: 92% 94%  Weight: 210 lb (95.3 kg) 210 lb (95.3 kg)  Height: _0  (1.651 m) _1  (1.651 m)   4LNC  Gen: chronically ill appearing HENT: OP clear,  neck supple PULM: Crackles bases, no wheezing B, normal percussion CV: RRR, no mgr, trace edema GI: BS+, soft, nontender Derm: no cyanosis or rash Psyche: normal mood and affect     CBC    Component Value Date/Time   WBC 5.9 10/30/2017 0747   RBC 4.76 10/30/2017 0747   HGB 15.3 (H) 10/30/2017 0747   HCT 47.0 (H) 10/30/2017 0747   PLT 206 10/30/2017 0747   MCV 98.7 10/30/2017 0747   MCH 32.1 10/30/2017 0747   MCHC 32.6  10/30/2017 0747   RDW 14.1 10/30/2017 0747   LYMPHSABS 1.5 11/09/2014 1231   MONOABS 0.6 11/09/2014 1231   EOSABS 0.2 11/09/2014 1231   BASOSABS 0.0 11/09/2014 1231     Chest imaging: CTA chest (1/19): No PE, air trapping noted, some centrilobular emphysema noted, some paraseptal emphysema noted, fibrotic change in the periphery and appears to be worse in the bases V/Q scan (3/19): No evidence for chronic PE.  HRCT 10/2017: images independently reviewed: peripheral based recitulation, interlobular thickening and honeycombing worse in the bases, some traction bronchiectasis, some areas of preserved lung noted. Per radiology probably UIP, possible fibrotic NSIP; by [facility] read this is consistent with UIP; paratracheal adenopathy, thyroid nodule 2 cm, cardiomegally   PFT: - PFTs (3/19): Ratio 63%, FVC 1.86L FEV1 1.28 L 61% predicted, total lung capacity 4.2 L 80% predicted, DLCO 8.10 31% predicted December 2019: FVC 1.75 L 62% predicted, DLCO 6.9 mL 26% predicted  Labs: 10/2017: ANA neg, SCL 70 neg, Anti-Jo-1 neg, Centromere neg, SSA/SSB neg, RF neg, HP panel neg  Path:  Echo:  Echo (1/19) with EF 55-60%, mild LVH, mild MR, moderate RV dilation with moderately decreased systolic function, moderate TR, PASP 74 mmHg.   Heart Catheterization: - RHC (3/19): mean RA 2, PA 54/16 mean 28, mean PCWP 8, CI 2.55, PVR 3.9 WU.  Sleep study: July 2019 home sleep test showed moderate obstructive sleep apnea with an AHI of 28, O2 saturation nadir 75%, 700 minutes below 89   Records from her July 2019 visit with our nurse practitioner in the pulmonary clinic reviewed where she had recently been started on Symbicort for COPD.  She noted compliance with Lasix and weight was down by 12 pounds.  Encouraged to continue to participate in pulmonary rehab and to use oxygen.  Ofev was continued at 100 mg twice a day.    Assessment & Plan:   No diagnosis found.  Discussion: Shaquille's lung function testing  shows decline today which is consistent with the natural history of idiopathic pulmonary fibrosis.  I do not feel that there is an alternative treatment we could change to to slow the decline further.  There is no data from the literature that shows a clear benefit to additive therapy and considering her Crohn's disease I think we would cause more harm than good by increasing the nintedanib.  She has been noncompliant with oxygen so we talked about the consequences of that today.  I explained that this will speed the process of her dying because it will cause worsening pulmonary hypertension.  Plan: COPD: Use albuterol nebulized as needed for chest tightness wheezing or shortness of breath  Idiopathic pulmonary fibrosis: Today's lung function testing  showed a decline Continue taking nintedanib 100 mg twice a day We will get liver function testing today to make sure there is no evidence of toxicity from the medicine Lung function testing again in 6 months Stay active Practice good hand hygiene  Obstructive sleep apnea: Because of the difficulty you have had with the mask we will arrange for a mask fitting class at [facility] using CPAP as much as possible  Chronic respiratory failure with hypoxemia: Keep using 3 L of oxygen at rest, 4 L with exertion Keep in mind that not using oxygen causes heart failure to worsen  We will see you back in 3 months or sooner if needed   Current Outpatient Medications:  .  antiseptic oral rinse (BIOTENE) LIQD, 15 mLs by Mouth Rinse route as needed for dry mouth., Disp: 1 Bottle, Rfl: 2 .  aspirin 81 MG tablet, Take 81 mg by mouth daily.  , Disp: , Rfl:  .  clopidogrel (PLAVIX) 75 MG tablet, Take 1 tablet (75 mg total) by mouth daily., Disp: 30 tablet, Rfl: 6 .  Cyanocobalamin (B-12 PO), Take 1 tablet by mouth See admin instructions. Take 1 capsule every day x 7 days every other week , Disp: , Rfl:  .  dicyclomine (BENTYL) 10 MG capsule, Take 1  capsule (10 mg total) by mouth as needed for spasms., Disp: 30 capsule, Rfl: 3 .  ferrous sulfate 325 (65 FE) MG tablet, Take 325 mg by mouth daily., Disp: , Rfl:  .  Fexofenadine HCl (ALLEGRA ALLERGY PO), Take 1 tablet by mouth daily as needed (for allergies). , Disp: , Rfl:  .  FLUoxetine (PROZAC) 40 MG capsule, Take 40 mg by mouth daily., Disp: , Rfl:  .  folic acid (FOLVITE) 1 MG tablet, Take 1 tablet (1 mg total) by mouth daily., Disp: 90 tablet, Rfl: 4 .  furosemide (LASIX) 40 MG tablet, Take 1.5 tablets (60 mg total) by mouth daily., Disp: 45 tablet, Rfl: 3 .  gabapentin (NEURONTIN) 100 MG capsule, Take 1 capsule (100 mg total) by mouth 2 (two) times daily., Disp: 60 capsule, Rfl: 6 .  HYDROcodone-acetaminophen (NORCO) 10-325 MG tablet, Take 1 tablet by mouth every 6 (six) hours as needed for moderate pain. , Disp: , Rfl:  .  metoprolol succinate (TOPROL-XL) 25 MG 24 hr tablet, TAKE 1 TABLET BY MOUTH EVERY DAY, Disp: 90 tablet, Rfl: 2 .  Misc Natural Products (OSTEO BI-FLEX JOINT SHIELD PO), Take 1 capsule by mouth daily., Disp: , Rfl:  .  Nintedanib (OFEV) 100 MG CAPS, Take 100 mg by mouth 2 (two) times daily., Disp: , Rfl:  .  nitroGLYCERIN (NITROSTAT) 0.4 MG SL tablet, Place 1 tablet (0.4 mg total) under the tongue every 5 (five) minutes x 3 doses as needed. (Patient taking differently: Place 0.4 mg under the tongue every 5 (five) minutes x 3 doses as needed for chest pain. ), Disp: 25 tablet, Rfl: 3 .  Omega-3 Fatty Acids (FISH OIL) 1200 MG CAPS, Take 1,200 mg by mouth daily. , Disp: , Rfl:  .  potassium chloride SA (K-DUR,KLOR-CON) 20 MEQ tablet, Take 1 tablet (20 mEq total) by mouth daily., Disp: 30 tablet, Rfl: 3 .  rosuvastatin (CRESTOR) 10 MG tablet, Take 1 tablet (10 mg total) by mouth every other day., Disp: , Rfl:  .  sulfaSALAzine (AZULFIDINE) 500 MG tablet, Take 1 tablet (500 mg total) by mouth 2 (two) times daily., Disp: 60 tablet, Rfl: 6

## 2018-07-27 NOTE — Progress Notes (Signed)
Patient completed full PFT today. 

## 2018-07-27 NOTE — Patient Instructions (Signed)
COPD: Use albuterol nebulized as needed for chest tightness wheezing or shortness of breath  Idiopathic pulmonary fibrosis: Today's lung function testing showed a decline Continue taking nintedanib 100 mg twice a day We will get liver function testing today to make sure there is no evidence of toxicity from the medicine Lung function testing again in 6 months Stay active Practice good hand hygiene  Obstructive sleep apnea: Because of the difficulty you have had with the mask we will arrange for a mask fitting class at [facility] using CPAP as much as possible  Chronic respiratory failure with hypoxemia: Keep using 3 L of oxygen at rest, 4 L with exertion Keep in mind that not using oxygen causes heart failure to worsen  We will see you back in 3 months or sooner if needed

## 2018-07-28 ENCOUNTER — Telehealth: Payer: Self-pay | Admitting: Pulmonary Disease

## 2018-07-28 NOTE — Telephone Encounter (Signed)
[facility] needs new RX printed with Dx code and can NOT state PRN for nebulizer medication-then have BQ sign. Fax back to [place] at [phone] along with patient's insurance card.   Burman Nieves, I have corrected Rx and pended the Rx-if you will please print and have BQ sign. Triage can fax to [place] with Insurance card.   Thanks.

## 2018-07-29 ENCOUNTER — Telehealth: Payer: Self-pay | Admitting: Pulmonary Disease

## 2018-07-29 DIAGNOSIS — I5032 Chronic diastolic (congestive) heart failure: Secondary | ICD-10-CM | POA: Diagnosis not present

## 2018-07-29 DIAGNOSIS — R0602 Shortness of breath: Secondary | ICD-10-CM | POA: Diagnosis not present

## 2018-07-29 DIAGNOSIS — J449 Chronic obstructive pulmonary disease, unspecified: Secondary | ICD-10-CM | POA: Diagnosis not present

## 2018-07-29 DIAGNOSIS — R2689 Other abnormalities of gait and mobility: Secondary | ICD-10-CM | POA: Diagnosis not present

## 2018-07-29 DIAGNOSIS — G4733 Obstructive sleep apnea (adult) (pediatric): Secondary | ICD-10-CM | POA: Diagnosis not present

## 2018-07-29 MED ORDER — ALBUTEROL SULFATE (2.5 MG/3ML) 0.083% IN NEBU: 2.5000 mg | INHALATION_SOLUTION | Freq: Four times a day (QID) | RESPIRATORY_TRACT | 11 refills | Status: DC

## 2018-07-29 NOTE — Telephone Encounter (Signed)
Shelley Doom, MD sent to Dolores Lory, RN        BJ,  Please let the patient know this was OK  Thanks,  B    Spoke with pt and notified of results per Dr. Lake Bells. Pt verbalized understanding and denied any questions.

## 2018-07-29 NOTE — Telephone Encounter (Signed)
Rx has been printed and placed in BQ's lookat folder.  Will keep encounter open until all has been fully taken care of for pt.

## 2018-07-29 NOTE — Progress Notes (Signed)
Spoke with pt and notified of results per Dr. Wert. Pt verbalized understanding and denied any questions. 

## 2018-07-30 ENCOUNTER — Telehealth: Payer: Self-pay | Admitting: Pulmonary Disease

## 2018-07-30 NOTE — Telephone Encounter (Signed)
Called and spoke with Judson Roch at [facility]  She states that with the pt's insurance, healthte[employer] advantage, they are unable to provide nebs  Need to let pt know and see what local pharm we can send rx  LMTCB for the pt

## 2018-07-30 NOTE — Telephone Encounter (Signed)
Order signed and faxed to [facility]. Confirmation obtained. Nothing further is needed at this time.

## 2018-07-31 MED ORDER — ALBUTEROL SULFATE (2.5 MG/3ML) 0.083% IN NEBU: 2.5000 mg | INHALATION_SOLUTION | Freq: Four times a day (QID) | RESPIRATORY_TRACT | 11 refills | Status: DC

## 2018-07-31 NOTE — Telephone Encounter (Signed)
Patient called back; she uses [facility].

## 2018-07-31 NOTE — Addendum Note (Signed)
Addended by: Elton Sin on: 07/31/2018 09:47 AM   Modules accepted: Orders

## 2018-07-31 NOTE — Telephone Encounter (Signed)
ATC Patient, no answer, unable to leave message.  Per last message, Patient uses [facility].  Neb prescription sent to [facility].  Nothing further.

## 2018-08-01 IMAGING — NM NM PULMONARY VENT & PERF
16 series · 16 of 16 positions shown · non-contrast
Comparison: CT 09/24/2017.

CLINICAL DATA: Leg and feet swelling.  Weight gain.

EXAM:
NUCLEAR MEDICINE VENTILATION - PERFUSION LUNG SCAN
TECHNIQUE: Ventilation images were obtained in multiple projections using
inhaled aerosol 2c-44m DTPA. Perfusion images were obtained in
multiple projections after intravenous injection of [id number].
RADIOPHARMACEUTICALS:  32.5 mCi of 2c-44m DTPA aerosol inhalation
and 4.4 mCi [id number] IV

[Series 1: ant/post vent · 4.14mm/px · 1 of 1 slices shown (1 of 2)]
[im 1/1]
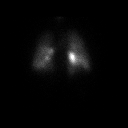

[Series 1: ant/post vent · 4.14mm/px · 1 of 1 slices shown (2 of 2)]
[im 1/1]
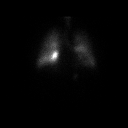

[Series 2: lao/rpo vent · 4.14mm/px · 1 of 1 slices shown (1 of 2)]
[im 1/1]
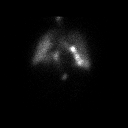

[Series 2: lao/rpo vent · 4.14mm/px · 1 of 1 slices shown (2 of 2)]
[im 1/1]
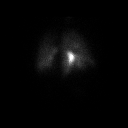

[Series 3: lpo/rao vent · 4.14mm/px · 1 of 1 slices shown (1 of 2)]
[im 1/1]
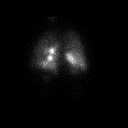

[Series 3: lpo/rao vent · 4.14mm/px · 1 of 1 slices shown (2 of 2)]
[im 1/1]
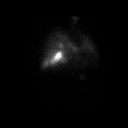

[Series 4: lt lat/rt lat vent · 4.14mm/px · 1 of 1 slices shown (1 of 2)]
[im 1/1]
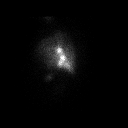

[Series 4: lt lat/rt lat vent · 4.14mm/px · 1 of 1 slices shown (2 of 2)]
[im 1/1]
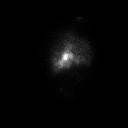

[Series 5: lt lat/rt lat perf · 4.14mm/px · 1 of 1 slices shown (1 of 2)]
[im 1/1]
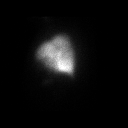

[Series 5: lt lat/rt lat perf · 4.14mm/px · 1 of 1 slices shown (2 of 2)]
[im 1/1]
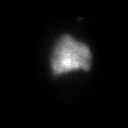

[Series 6: lpo/rao perf · 4.14mm/px · 1 of 1 slices shown (1 of 2)]
[im 1/1]
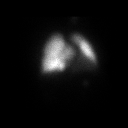

[Series 6: lpo/rao perf · 4.14mm/px · 1 of 1 slices shown (2 of 2)]
[im 1/1]
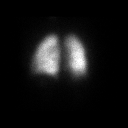

[Series 7: ant/post perf · 4.14mm/px · 1 of 1 slices shown (1 of 2)]
[im 1/1]
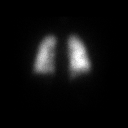

[Series 7: ant/post perf · 4.14mm/px · 1 of 1 slices shown (2 of 2)]
[im 1/1]
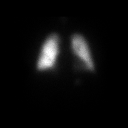

[Series 8: lao/rpo perf · 4.14mm/px · 1 of 1 slices shown (1 of 2)]
[im 1/1]
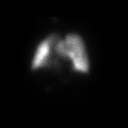

[Series 8: lao/rpo perf · 4.14mm/px · 1 of 1 slices shown (2 of 2)]
[im 1/1]
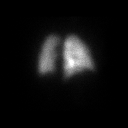

[16 of 16 positions shown; findings below may reference images not displayed]

FINDINGS: Ventilation: No focal ventilation defect.

Perfusion: No wedge shaped peripheral perfusion defects to suggest
acute pulmonary embolism.
IMPRESSION: Negative exam.

## 2018-08-03 ENCOUNTER — Ambulatory Visit (HOSPITAL_COMMUNITY)
Admission: RE | Admit: 2018-08-03 | Discharge: 2018-08-03 | Disposition: A | Discharge status: Home / Self Care | Payer: PPO | Source: Ambulatory Visit | Attending: Cardiology | Admitting: Cardiology

## 2018-08-03 VITALS — BP 130/68 | HR 71 | Wt 210.2 lb

## 2018-08-03 DIAGNOSIS — Z833 Family history of diabetes mellitus: Secondary | ICD-10-CM | POA: Insufficient documentation

## 2018-08-03 DIAGNOSIS — I739 Peripheral vascular disease, unspecified: Secondary | ICD-10-CM | POA: Insufficient documentation

## 2018-08-03 DIAGNOSIS — I2510 Atherosclerotic heart disease of native coronary artery without angina pectoris: Secondary | ICD-10-CM | POA: Insufficient documentation

## 2018-08-03 DIAGNOSIS — Z7982 Long term (current) use of aspirin: Secondary | ICD-10-CM | POA: Insufficient documentation

## 2018-08-03 DIAGNOSIS — I25119 Atherosclerotic heart disease of native coronary artery with unspecified angina pectoris: Secondary | ICD-10-CM

## 2018-08-03 DIAGNOSIS — Z8249 Family history of ischemic heart disease and other diseases of the circulatory system: Secondary | ICD-10-CM | POA: Insufficient documentation

## 2018-08-03 DIAGNOSIS — I447 Left bundle-branch block, unspecified: Secondary | ICD-10-CM | POA: Insufficient documentation

## 2018-08-03 DIAGNOSIS — J449 Chronic obstructive pulmonary disease, unspecified: Secondary | ICD-10-CM | POA: Diagnosis not present

## 2018-08-03 DIAGNOSIS — M1990 Unspecified osteoarthritis, unspecified site: Secondary | ICD-10-CM | POA: Insufficient documentation

## 2018-08-03 DIAGNOSIS — I6529 Occlusion and stenosis of unspecified carotid artery: Secondary | ICD-10-CM | POA: Diagnosis not present

## 2018-08-03 DIAGNOSIS — I5032 Chronic diastolic (congestive) heart failure: Secondary | ICD-10-CM | POA: Insufficient documentation

## 2018-08-03 DIAGNOSIS — I272 Pulmonary hypertension, unspecified: Secondary | ICD-10-CM | POA: Diagnosis not present

## 2018-08-03 DIAGNOSIS — Z7902 Long term (current) use of antithrombotics/antiplatelets: Secondary | ICD-10-CM | POA: Insufficient documentation

## 2018-08-03 DIAGNOSIS — G4733 Obstructive sleep apnea (adult) (pediatric): Secondary | ICD-10-CM | POA: Diagnosis not present

## 2018-08-03 DIAGNOSIS — J84112 Idiopathic pulmonary fibrosis: Secondary | ICD-10-CM | POA: Diagnosis not present

## 2018-08-03 DIAGNOSIS — I2721 Secondary pulmonary arterial hypertension: Secondary | ICD-10-CM

## 2018-08-03 DIAGNOSIS — Z86718 Personal history of other venous thrombosis and embolism: Secondary | ICD-10-CM | POA: Diagnosis not present

## 2018-08-03 DIAGNOSIS — Z7901 Long term (current) use of anticoagulants: Secondary | ICD-10-CM | POA: Diagnosis not present

## 2018-08-03 DIAGNOSIS — Z8673 Personal history of transient ischemic attack (TIA), and cerebral infarction without residual deficits: Secondary | ICD-10-CM | POA: Insufficient documentation

## 2018-08-03 DIAGNOSIS — F329 Major depressive disorder, single episode, unspecified: Secondary | ICD-10-CM | POA: Insufficient documentation

## 2018-08-03 DIAGNOSIS — E785 Hyperlipidemia, unspecified: Secondary | ICD-10-CM | POA: Diagnosis not present

## 2018-08-03 DIAGNOSIS — Z79899 Other long term (current) drug therapy: Secondary | ICD-10-CM | POA: Insufficient documentation

## 2018-08-03 DIAGNOSIS — I110 Hypertensive heart disease with heart failure: Secondary | ICD-10-CM | POA: Diagnosis not present

## 2018-08-03 DIAGNOSIS — Z87891 Personal history of nicotine dependence: Secondary | ICD-10-CM | POA: Insufficient documentation

## 2018-08-03 MED ORDER — RIVAROXABAN 2.5 MG PO TABS: 2.5000 mg | ORAL_TABLET | Freq: Two times a day (BID) | ORAL | 6 refills | Status: DC

## 2018-08-03 MED ORDER — SPIRONOLACTONE 25 MG PO TABS: 12.5000 mg | ORAL_TABLET | Freq: Every day | ORAL | 6 refills | Status: DC

## 2018-08-03 NOTE — Patient Instructions (Addendum)
START Xarelto 2.23m (1 tab) twice a day  START Spironolactone 12.5 (0.5 tab) daily  DISCONTINUE Plavix  Your physician recommends that you schedule a follow-up appointment in: 3 months with an ECHO  Your physician has requested that you have an echocardiogram. Echocardiography is a painless test that uses sound waves to create images of your heart. It provides your doctor with information about the size and shape of your heart and how well your heart's chambers and valves are working. This procedure takes approximately one hour. There are no restrictions for this procedure.  Please have labs done in 10 days. Fax results to [phone]

## 2018-08-03 NOTE — Progress Notes (Signed)
PCP: Dr. Scotty Court Cardiology: Dr. Domenic Polite HF Cardiology: Dr. Aundra Dubin  77 y.o. with history of prior DVT, CAD, HTN was referred by Dr. Domenic Polite for evaluation of pulmonary hypertension/RV failure.  Patient is a long-time smoker, quit in 3/19. In 1/19, she began to develop peripheral edema.  This gradually worsened.  She was given Lasix for 7 days with improvement, then edema returned and recently, Lasix was restarted at 40 mg daily.    In 3/19, I took her for right/left heart cath.  This showed 70% in-stent restenosis in the RCA, medically managed.  She had normal filling pressures on RHC with moderate pulmonary hypertension.  V/Q scan showed no chronic PE and PFTs showed moderate to severe obstructive defect.   She saw Dr. Lake Bells with pulmonary, and was diagnosed with both COPD and idiopathic pulmonary fibrosis.  She was started on Ofev.    She returns for followup of CHF and pulmonary hypertension.  She has home oxygen.  She has CPAP but is having trouble using it.  Weight is up 3 lbs.  She is not smoking any. Breathing is stable, she was short of breath walking into the office. No orthopnea/PND.  She is having more calf pain, bilateral calf claudication walking about 50-100 feet.  She also is limited by peripheral neuropathy.  No chest pain.   Labs (1/19): K 3.8, creatinine 0.74 Labs (3/19): K 4.6, creatinine 0.87 Labs (5/19): LDL 52 Labs (7/19): K 4.3, creatinine 0.82 Labs (11/19): LDL 55, TGs 73 Labs (12/19): K 4.1, creatinine 0.87  PMH: 1. CAD: BMS to RCA in 2004.  - Cardiolite (12/16): EF 52%, fixed inferior defect with no ischemia.  - LHC (3/19): 70% in-stent restenosis in RCA stent, managed medically.  2. Depression 3. H/o DVT 4. HTN 5. Osteoarthritis: h/o R TKR.  6. H/o melanoma 7. Crohns disease 8. Hyperlipidemia 9. Carotid stenosis: Followed at [facility].  - Carotid dopplers (11/18): RICA 78-58% stenosis, LICA 85-02% stenosis.  10. COPD: No longer smokes. -  PFTs (3/19):  moderate-severe obstruction consistent with COPD. 11. Pulmonary hypertension/RV failure: Suspect primarily group 3.  Echo (1/19) with EF 55-60%, mild LVH, mild MR, moderate RV dilation with moderately decreased systolic function, moderate TR, PASP 74 mmHg.  - CTA chest (1/19): No PE, chronic fibrotic changes in the lungs, 6 mm RUL nodule.  - RHC (3/19): mean RA 2, PA 54/16 mean 28, mean PCWP 8, CI 2.55, PVR 3.9 WU.  - PFTs (3/19): moderate-severe obstruction consistent with COPD.  - V/Q scan (3/19): No evidence for chronic PE.  12. TIA  13. PAD: Peripheral arterial dopplers (3/19) with totally occluded right AT, totally occluded left SFA, severe stenosis in left PT.  - Followed by Dr. Oneida Alar.  14. Idiopathic pulmonary fibrosis: Followed by Dr. Lake Bells, she is on Ofev.  15. LBBB 16. Multinodular goiter 17. OSA  Social History   Socioeconomic History  . Marital status: Divorced    Spouse name: Not on file  . Number of children: 2  . Years of education: Not on file  . Highest education level: Not on file  Occupational History  . Occupation: Retired    Fish farm manager: RETIRED  Social Needs  . Financial resource strain: Not on file  . Food insecurity:    Worry: Not on file    Inability: Not on file  . Transportation needs:    Medical: Not on file    Non-medical: Not on file  Tobacco Use  . Smoking status: Former Smoker  Packs/day: 0.25    Years: 40.00    Pack years: 10.00    Types: Cigarettes    Start date: 06/02/1957    Last attempt to quit: 09/2017    Years since quitting: 0.8  . Smokeless tobacco: Never Used  Substance and Sexual Activity  . Alcohol use: No    Alcohol/week: 0.0 standard drinks  . Drug use: No  . Sexual activity: Not on file  Lifestyle  . Physical activity:    Days per week: Not on file    Minutes per session: Not on file  . Stress: Not on file  Relationships  . Social connections:    Talks on phone: Not on file    Gets together: Not on file     Attends religious service: Not on file    Active member of club or organization: Not on file    Attends meetings of clubs or organizations: Not on file    Relationship status: Not on file  . Intimate partner violence:    Fear of current or ex partner: Not on file    Emotionally abused: Not on file    Physically abused: Not on file    Forced sexual activity: Not on file  Other Topics Concern  . Not on file  Social History Narrative  . Not on file   Family History  Problem Relation Age of Onset  . Diabetes type II Mother   . Heart attack Mother 58       questionable  . Uterine cancer Mother   . Heart disease Mother        before age 53  . Varicose Veins Mother   . Other Brother        PVD and hx DVT  . Deep vein thrombosis Brother   . Breast cancer Sister   . Hyperlipidemia Sister   . Hypertension Sister   . Colon cancer Neg Hx    ROS: All systems reviewed and negative except as per HPI.  Current Outpatient Medications  Medication Sig Dispense Refill  . albuterol (PROVENTIL) (2.5 MG/3ML) 0.083% nebulizer solution Take 3 mLs (2.5 mg total) by nebulization every 6 (six) hours. Dx COPD J44.9 75 mL 11  . aspirin 81 MG tablet Take 81 mg by mouth daily.      . Cyanocobalamin (B-12 PO) Take 1 tablet by mouth See admin instructions. Take 1 capsule every day x 7 days every other week     . ferrous sulfate 325 (65 FE) MG tablet Take 325 mg by mouth daily.    Marland Kitchen FLUoxetine (PROZAC) 40 MG capsule Take 40 mg by mouth daily.    . folic acid (FOLVITE) 1 MG tablet Take 1 tablet (1 mg total) by mouth daily. 90 tablet 4  . furosemide (LASIX) 40 MG tablet Take 1.5 tablets (60 mg total) by mouth daily. 45 tablet 3  . gabapentin (NEURONTIN) 100 MG capsule Take 1 capsule (100 mg total) by mouth 2 (two) times daily. 60 capsule 6  . HYDROcodone-acetaminophen (NORCO) 10-325 MG tablet Take 1 tablet by mouth every 6 (six) hours as needed for moderate pain.     . metoprolol succinate (TOPROL-XL) 25 MG  24 hr tablet TAKE 1 TABLET BY MOUTH EVERY DAY 90 tablet 2  . Misc Natural Products (OSTEO BI-FLEX JOINT SHIELD PO) Take 1 capsule by mouth daily.    . Nintedanib (OFEV) 100 MG CAPS Take 100 mg by mouth 2 (two) times daily.    . Omega-3 Fatty  Acids (FISH OIL) 1200 MG CAPS Take 1,200 mg by mouth daily.     . potassium chloride SA (K-DUR,KLOR-CON) 20 MEQ tablet Take 1 tablet (20 mEq total) by mouth daily. 30 tablet 3  . rosuvastatin (CRESTOR) 10 MG tablet Take 1 tablet (10 mg total) by mouth every other day.    . sulfaSALAzine (AZULFIDINE) 500 MG tablet Take 1 tablet (500 mg total) by mouth 2 (two) times daily. 60 tablet 6  . antiseptic oral rinse (BIOTENE) LIQD 15 mLs by Mouth Rinse route as needed for dry mouth. (Patient not taking: Reported on 08/03/2018) 1 Bottle 2  . dicyclomine (BENTYL) 10 MG capsule Take 1 capsule (10 mg total) by mouth as needed for spasms. (Patient not taking: Reported on 08/03/2018) 30 capsule 3  . Fexofenadine HCl (ALLEGRA ALLERGY PO) Take 1 tablet by mouth daily as needed (for allergies).     . nitroGLYCERIN (NITROSTAT) 0.4 MG SL tablet Place 1 tablet (0.4 mg total) under the tongue every 5 (five) minutes x 3 doses as needed. (Patient not taking: Reported on 08/03/2018) 25 tablet 3  . rivaroxaban (XARELTO) 2.5 MG TABS tablet Take 1 tablet (2.5 mg total) by mouth 2 (two) times daily. 60 tablet 6  . spironolactone (ALDACTONE) 25 MG tablet Take 0.5 tablets (12.5 mg total) by mouth daily. 15 tablet 6   No current facility-administered medications for this encounter.    BP 130/68   Pulse 71   Wt 95.3 kg (210 lb 3.2 oz)   SpO2 90% Comment: 3L  BMI 34.98 kg/m  General: NAD Neck: JVP 8 cm, no thyromegaly or thyroid nodule.  Lungs: Dry crackles at bases.  CV: Nondisplaced PMI.  Heart regular S1/S2, no S3/S4, no murmur.  Trace ankle edema.  No carotid bruit.  Normal pedal pulses.  Abdomen: Soft, nontender, no hepatosplenomegaly, no distention.  Skin: Intact without lesions or  rashes.  Neurologic: Alert and oriented x 3.  Psych: Normal affect. Extremities: No clubbing or cyanosis.  HEENT: Normal.   Assessment/Plan: 1. Chronic diastolic CHF with prominent RV failure: Moderately dilated RV with moderately decreased systolic function on 9/52 echo.   [place] in 3/19 showed normal right and left heart filling pressures.   Chronic NYHA class III symptoms.  Suspect this is from a combination of CHF, COPD and IPF.  She has very minimal volume overload on exam.    - Continue Lasix 60 mg daily.  - I will add spironolactone 12.5 mg daily.  BMET in 10 days.  - Echo at followup appt.  2. Pulmonary hypertension: PASP 74 mmHg on 1/19 echo with appearance of cor pulmonale.  CTA chest in 1/19 showed chronic fibrotic changes in the lungs but not emphysema, IPF diagnosed by Dr. Lake Bells.  However, PFTs in 3/19 were suggestive of moderate-severe obstruction consistent with COPD.  RHC (3/19) showed moderate pulmonary arterial hypertension.  V/Q scan showed no evidence for chronic pulmonary embolus.  My suspicion is that her pulmonary hypertension is primarily group 3 due to COPD and idiopathic pulmonary fibrosis.  She has OSA as well.  - I am not inclined to treat with pulmonary vasodilators, Dr Lake Bells has agreed with this.   - Use home oxygen.  - I encourage CPAP use.  3. Pulmonary fibrosis: IPF.  She is now on Ofev.  She does not see much of a difference. - She has quit smoking.  4. CAD: h/o BMS to RCA in 2004.  LHC in 3/19 showed 70% in-stent restenosis in the  RCA.  With no chest pain, plan to treat medically.  - Continue ASA 81.  - She has extensive vascular disease.  Based on COMPASS study, will have her start rivaroxaban 2.5 mg bid and stop Plavix.  - She is tolerating Crestor 10 mg qod without myalgias, good lipids in 11/19.  5. Carotid stenosis: Followed by [facility]. 6. PAD: Peripheral arterial dopplers (3/19) showed occluded left SFA.  Claudication seems worse recently. - She has  followup with Dr. Oneida Alar to consider angiography.  - She has quit smoking.  7. COPD: She has now quit smoking.   Shelley [name] 08/03/2018

## 2018-08-06 ENCOUNTER — Encounter: Payer: Self-pay | Admitting: Vascular Surgery

## 2018-08-06 ENCOUNTER — Ambulatory Visit (INDEPENDENT_AMBULATORY_CARE_PROVIDER_SITE_OTHER)
Admission: RE | Admit: 2018-08-06 | Discharge: 2018-08-06 | Disposition: A | Discharge status: Home / Self Care | Payer: PPO | Source: Ambulatory Visit | Attending: Vascular Surgery | Admitting: Vascular Surgery

## 2018-08-06 ENCOUNTER — Other Ambulatory Visit: Payer: Self-pay

## 2018-08-06 ENCOUNTER — Ambulatory Visit: Payer: PPO | Admitting: Vascular Surgery

## 2018-08-06 ENCOUNTER — Ambulatory Visit (HOSPITAL_COMMUNITY)
Admission: RE | Admit: 2018-08-06 | Discharge: 2018-08-06 | Disposition: A | Discharge status: Home / Self Care | Payer: PPO | Source: Ambulatory Visit | Attending: Vascular Surgery | Admitting: Vascular Surgery

## 2018-08-06 VITALS — BP 131/71 | HR 59 | Temp 97.0°F | Resp 14 | Ht 65.0 in | Wt 210.0 lb

## 2018-08-06 DIAGNOSIS — I6523 Occlusion and stenosis of bilateral carotid arteries: Secondary | ICD-10-CM

## 2018-08-06 DIAGNOSIS — I739 Peripheral vascular disease, unspecified: Secondary | ICD-10-CM | POA: Diagnosis not present

## 2018-08-06 DIAGNOSIS — I779 Disorder of arteries and arterioles, unspecified: Secondary | ICD-10-CM

## 2018-08-06 NOTE — Progress Notes (Signed)
Patient name: Shelley [name] MRN: [id number] DOB: Jan 19, 1941 Sex: female  HPI: Shelley [name] is a 77 y.o. female returns today for follow-up regarding carotid occlusive disease as well as peripheral arterial disease.  The patient continues to deny any symptoms of TIA amaurosis or stroke.  She is currently on aspirin and Crestor.  Xarelto was recently added by Dr. Benjamine Mola due to her history of peripheral arterial disease and coronary artery disease based on Compass study.  As far as her peripheral arterial disease is concerned she does not really describe claudication or rest pain symptoms.  She does have bilateral peripheral neuropathy with numbness and tingling in both feet.  Her walking distance is much more limited by her overall pulmonary dysfunction.  She is on continuous home oxygen therapy although she admits to being noncompliant with this at times.  Other medical problems include pulmonary fibrosis, hyperlipidemia, coronary artery disease, COPD all of which are currently stable.  She is on Neurontin for her peripheral neuropathy.  She is not sure whether or not her symptoms are improved with this or not.  Past Medical History:  Diagnosis Date  . Anxiety   . Carotid artery disease ([place])    59-97% RICA and LICA 7/41 - Dr. Oneida Alar  . Cataract   . COPD (chronic obstructive pulmonary disease) ([place])   . Coronary atherosclerosis of native coronary artery    BMS to RCA 2004  . Depression   . DVT (deep venous thrombosis) ([place])   . Essential hypertension, benign   . Fracture Left Great Toe  . Hepatic steatosis   . History of melanoma   . History of oral aphthous ulcers   . History of Salmonella gastroenteritis   . Hx of adenomatous colonic polyps   . Mixed hyperlipidemia   . Myocardial infarction ([place])    IMI 10/04  . Osteoarthritis   . Osteoporosis   . Pancreatitis   . Rectal bleeding   . Regional enteritis of large intestine ([place])   . Sinus polyp   . TIA (transient ischemic  attack)   . Tubular adenoma of colon 08/2012   Past Surgical History:  Procedure Laterality Date  . CATARACT EXTRACTION Bilateral   . Melanoma resection  1996  . RIGHT/LEFT HEART CATH AND CORONARY ANGIOGRAPHY N/A 10/30/2017   Procedure: RIGHT/LEFT HEART CATH AND CORONARY ANGIOGRAPHY;  Surgeon: Larey Dresser, MD;  Location: [facility];  Service: Cardiovascular;  Laterality: N/A;  . TOTAL KNEE ARTHROPLASTY Right Feb 2012    Family History  Problem Relation Age of Onset  . Diabetes type II Mother   . Heart attack Mother 26       questionable  . Uterine cancer Mother   . Heart disease Mother        before age 74  . Varicose Veins Mother   . Other Brother        PVD and hx DVT  . Deep vein thrombosis Brother   . Breast cancer Sister   . Hyperlipidemia Sister   . Hypertension Sister   . Colon cancer Neg Hx     SOCIAL HISTORY: Social History   Socioeconomic History  . Marital status: Divorced    Spouse name: Not on file  . Number of children: 2  . Years of education: Not on file  . Highest education level: Not on file  Occupational History  . Occupation: Retired    Fish farm manager: RETIRED  Social Needs  . Financial resource strain: Not  on file  . Food insecurity:    Worry: Not on file    Inability: Not on file  . Transportation needs:    Medical: Not on file    Non-medical: Not on file  Tobacco Use  . Smoking status: Former Smoker    Packs/day: 0.25    Years: 40.00    Pack years: 10.00    Types: Cigarettes    Start date: 06/02/1957    Last attempt to quit: 09/2017    Years since quitting: 0.8  . Smokeless tobacco: Never Used  Substance and Sexual Activity  . Alcohol use: No    Alcohol/week: 0.0 standard drinks  . Drug use: No  . Sexual activity: Not on file  Lifestyle  . Physical activity:    Days per week: Not on file    Minutes per session: Not on file  . Stress: Not on file  Relationships  . Social connections:    Talks on phone: Not on file     Gets together: Not on file    Attends religious service: Not on file    Active member of club or organization: Not on file    Attends meetings of clubs or organizations: Not on file    Relationship status: Not on file  . Intimate partner violence:    Fear of current or ex partner: Not on file    Emotionally abused: Not on file    Physically abused: Not on file    Forced sexual activity: Not on file  Other Topics Concern  . Not on file  Social History Narrative  . Not on file    Allergies  Allergen Reactions  . Doxycycline Other (See Comments)    Poss. Photosensitivity  . Mercaptopurine Other (See Comments)    REACTION: pancreatitis  . Simvastatin Other (See Comments)    Severe leg aches  . Sulfasalazine   . Penicillins Rash and Other (See Comments)    Has patient had a PCN reaction causing immediate rash, facial/tongue/throat swelling, SOB or lightheadedness with hypotension: No Has patient had a PCN reaction causing severe rash involving mucus membranes or skin necrosis: No Has patient had a PCN reaction that required hospitalization: No Has patient had a PCN reaction occurring within the last 10 years: No If all of the above answers are "NO", then may proceed with Cephalosporin use.   . Sulfa Antibiotics Rash    Current Outpatient Medications  Medication Sig Dispense Refill  . albuterol (PROVENTIL) (2.5 MG/3ML) 0.083% nebulizer solution Take 3 mLs (2.5 mg total) by nebulization every 6 (six) hours. Dx COPD J44.9 75 mL 11  . antiseptic oral rinse (BIOTENE) LIQD 15 mLs by Mouth Rinse route as needed for dry mouth. 1 Bottle 2  . aspirin 81 MG tablet Take 81 mg by mouth daily.      . Cyanocobalamin (B-12 PO) Take 1 tablet by mouth See admin instructions. Take 1 capsule every day x 7 days every other week     . ferrous sulfate 325 (65 FE) MG tablet Take 325 mg by mouth daily.    Marland Kitchen FLUoxetine (PROZAC) 40 MG capsule Take 40 mg by mouth daily.    . folic acid (FOLVITE) 1 MG  tablet Take 1 tablet (1 mg total) by mouth daily. 90 tablet 4  . furosemide (LASIX) 40 MG tablet Take 1.5 tablets (60 mg total) by mouth daily. 45 tablet 3  . gabapentin (NEURONTIN) 100 MG capsule Take 1 capsule (100 mg total) by mouth  2 (two) times daily. 60 capsule 6  . HYDROcodone-acetaminophen (NORCO) 10-325 MG tablet Take 1 tablet by mouth every 6 (six) hours as needed for moderate pain.     . metoprolol succinate (TOPROL-XL) 25 MG 24 hr tablet TAKE 1 TABLET BY MOUTH EVERY DAY 90 tablet 2  . Misc Natural Products (OSTEO BI-FLEX JOINT SHIELD PO) Take 1 capsule by mouth daily.    . Nintedanib (OFEV) 100 MG CAPS Take 100 mg by mouth 2 (two) times daily.    . nitroGLYCERIN (NITROSTAT) 0.4 MG SL tablet Place 1 tablet (0.4 mg total) under the tongue every 5 (five) minutes x 3 doses as needed. 25 tablet 3  . Omega-3 Fatty Acids (FISH OIL) 1200 MG CAPS Take 1,200 mg by mouth daily.     . potassium chloride SA (K-DUR,KLOR-CON) 20 MEQ tablet Take 1 tablet (20 mEq total) by mouth daily. 30 tablet 3  . rivaroxaban (XARELTO) 2.5 MG TABS tablet Take 1 tablet (2.5 mg total) by mouth 2 (two) times daily. 60 tablet 6  . rosuvastatin (CRESTOR) 10 MG tablet Take 1 tablet (10 mg total) by mouth every other day.    . spironolactone (ALDACTONE) 25 MG tablet Take 0.5 tablets (12.5 mg total) by mouth daily. 15 tablet 6  . sulfaSALAzine (AZULFIDINE) 500 MG tablet Take 1 tablet (500 mg total) by mouth 2 (two) times daily. 60 tablet 6  . dicyclomine (BENTYL) 10 MG capsule Take 1 capsule (10 mg total) by mouth as needed for spasms. (Patient not taking: Reported on 08/06/2018) 30 capsule 3  . Fexofenadine HCl (ALLEGRA ALLERGY PO) Take 1 tablet by mouth daily as needed (for allergies).      No current facility-administered medications for this visit.     ROS:   General:  No weight loss, Fever, chills  HEENT: No recent headaches, no nasal bleeding, no visual changes, no sore throat  Neurologic: No dizziness,  blackouts, seizures. No recent symptoms of stroke or mini- stroke. No recent episodes of slurred speech, or temporary blindness.  Cardiac: No recent episodes of chest pain/pressure, no shortness of breath at rest.  No shortness of breath with exertion.  Denies history of atrial fibrillation or irregular heartbeat  Vascular: No history of rest pain in feet.  No history of claudication.  No history of non-healing ulcer, No history of DVT   Pulmonary: + home oxygen, no productive cough, no hemoptysis,  No asthma or wheezing  Musculoskeletal:  _0  Arthritis, _1  Low back pain,  _2  Joint pain  Hematologic:No history of hypercoagulable state.  No history of easy bleeding.  No history of anemia  Gastrointestinal: No hematochezia or melena,  No gastroesophageal reflux, no trouble swallowing  Urinary: _3  chronic Kidney disease, _4  on HD - _5  MWF or _6  TTHS, _7  Burning with urination, _8  Frequent urination, _9  Difficulty urinating;   Skin: No rashes  Psychological: No history of anxiety,  No history of depression   Physical Examination  Vitals:   08/06/18 0958 08/06/18 1002  BP: 121/70 131/71  Pulse: (!) 59 (!) 59  Resp: 14   Temp: (!) 97 F (36.1 C)   TempSrc: Oral   SpO2: 90%   Weight: 210 lb (95.3 kg)   Height: _10  (1.651 m)     Body mass index is 34.95 kg/m.  General:  Alert and oriented, no acute distress HEENT: Normal Neck: No right positive left carotid bruit no JVD Pulmonary: Clear  to auscultation bilaterally Cardiac: Regular Rate and Rhythm without murmur Abdomen: Soft, non-tender, non-distended Skin: No rash Extremity Pulses:  2+ radial, brachial, femoral, absent popliteal dorsalis pedis, posterior tibial pulses bilaterally Musculoskeletal: No deformity trace pretibial edema  Neurologic: Upper and lower extremity motor 5/5 and symmetric  DATA:  Patient had bilateral carotid duplex exam today which I reviewed and interpreted.  Right side was 60 to 80%, left  side 40 to 60%.  She also had bilateral ABIs performed which were 1.05 on the right 0.73 on the left.  I also reviewed and interpreted the study.  ASSESSMENT: #1 peripheral arterial disease unchanged ABIs no real claudication or rest pain symptoms.  She has numbness and tingling in her feet which more likely is related to peripheral neuropathy.  She is not sure whether or not her Neurontin is helping with her peripheral neuropathy symptoms.  She is going to treat give a trial of stopping this and considering discontinuing the medication as she is not convinced it is giving her much benefit  2.  Bilateral carotid occlusive disease currently asymptomatic would consider elective carotid intervention if greater than 80% stenosis or if she develops symptoms of TIA amaurosis or stroke.   PLAN: She Shelley follow-up with our nurse practitioner in 6 months.  We Shelley repeat her ABIs and carotid duplex exam at that time.   Ruta Hinds, MD [facility] Office: [phone] Pager: [phone]

## 2018-08-11 ENCOUNTER — Other Ambulatory Visit (HOSPITAL_BASED_OUTPATIENT_CLINIC_OR_DEPARTMENT_OTHER): Payer: PPO

## 2018-08-13 ENCOUNTER — Inpatient Hospital Stay (HOSPITAL_COMMUNITY): Admit: 2018-08-13 | Payer: Self-pay

## 2018-08-13 ENCOUNTER — Other Ambulatory Visit (HOSPITAL_COMMUNITY)
Admission: RE | Admit: 2018-08-13 | Discharge: 2018-08-13 | Disposition: A | Discharge status: Home / Self Care | Payer: PPO | Source: Ambulatory Visit | Attending: Cardiology | Admitting: Cardiology

## 2018-08-13 DIAGNOSIS — I272 Pulmonary hypertension, unspecified: Secondary | ICD-10-CM | POA: Insufficient documentation

## 2018-08-13 LAB — BASIC METABOLIC PANEL
Anion gap: 7 (ref 5–15)
BUN: 19 mg/dL (ref 8–23)
CHLORIDE: 107 mmol/L (ref 98–111)
CO2: 24 mmol/L (ref 22–32)
Calcium: 8.6 mg/dL — ABNORMAL LOW (ref 8.9–10.3)
Creatinine, Ser: 0.99 mg/dL (ref 0.44–1.00)
GFR calc Af Amer: >60 mL/min (ref >60)
GFR calc non Af Amer: 54 mL/min — ABNORMAL LOW (ref >60)
Glucose, Bld: 85 mg/dL (ref 70–99)
Potassium: 3.5 mmol/L (ref 3.5–5.1)
Sodium: 138 mmol/L (ref 135–145)

## 2018-08-13 LAB — CBC
HEMATOCRIT: 41.8 % (ref 36.0–46.0)
Hemoglobin: 12.9 g/dL (ref 12.0–15.0)
MCH: 32.7 pg (ref 26.0–34.0)
MCHC: 30.9 g/dL (ref 30.0–36.0)
MCV: 105.8 fL — ABNORMAL HIGH (ref 80.0–100.0)
Platelets: 184 10*3/uL (ref 150–400)
RBC: 3.95 MIL/uL (ref 3.87–5.11)
RDW: 13.5 % (ref 11.5–15.5)
WBC: 4.7 10*3/uL (ref 4.0–10.5)
nRBC: 0 % (ref 0.0–0.2)

## 2018-08-18 DIAGNOSIS — R2689 Other abnormalities of gait and mobility: Secondary | ICD-10-CM | POA: Diagnosis not present

## 2018-08-18 DIAGNOSIS — R0602 Shortness of breath: Secondary | ICD-10-CM | POA: Diagnosis not present

## 2018-08-18 DIAGNOSIS — I5032 Chronic diastolic (congestive) heart failure: Secondary | ICD-10-CM | POA: Diagnosis not present

## 2018-08-22 DIAGNOSIS — I5032 Chronic diastolic (congestive) heart failure: Secondary | ICD-10-CM | POA: Diagnosis not present

## 2018-08-22 DIAGNOSIS — G4733 Obstructive sleep apnea (adult) (pediatric): Secondary | ICD-10-CM | POA: Diagnosis not present

## 2018-08-22 DIAGNOSIS — R0602 Shortness of breath: Secondary | ICD-10-CM | POA: Diagnosis not present

## 2018-08-22 DIAGNOSIS — R2689 Other abnormalities of gait and mobility: Secondary | ICD-10-CM | POA: Diagnosis not present

## 2018-08-29 DIAGNOSIS — J449 Chronic obstructive pulmonary disease, unspecified: Secondary | ICD-10-CM | POA: Diagnosis not present

## 2018-08-29 DIAGNOSIS — I5032 Chronic diastolic (congestive) heart failure: Secondary | ICD-10-CM | POA: Diagnosis not present

## 2018-08-29 DIAGNOSIS — G4733 Obstructive sleep apnea (adult) (pediatric): Secondary | ICD-10-CM | POA: Diagnosis not present

## 2018-08-29 DIAGNOSIS — R2689 Other abnormalities of gait and mobility: Secondary | ICD-10-CM | POA: Diagnosis not present

## 2018-08-29 DIAGNOSIS — R0602 Shortness of breath: Secondary | ICD-10-CM | POA: Diagnosis not present

## 2018-08-31 ENCOUNTER — Ambulatory Visit (HOSPITAL_BASED_OUTPATIENT_CLINIC_OR_DEPARTMENT_OTHER): Payer: PPO | Attending: Pulmonary Disease | Admitting: Radiology

## 2018-08-31 DIAGNOSIS — G4733 Obstructive sleep apnea (adult) (pediatric): Secondary | ICD-10-CM

## 2018-09-02 ENCOUNTER — Other Ambulatory Visit: Payer: Self-pay

## 2018-09-02 MED ORDER — GABAPENTIN 100 MG PO CAPS: ORAL_CAPSULE | ORAL | 6 refills | Status: DC

## 2018-09-18 DIAGNOSIS — I5032 Chronic diastolic (congestive) heart failure: Secondary | ICD-10-CM | POA: Diagnosis not present

## 2018-09-18 DIAGNOSIS — R2689 Other abnormalities of gait and mobility: Secondary | ICD-10-CM | POA: Diagnosis not present

## 2018-09-18 DIAGNOSIS — R0602 Shortness of breath: Secondary | ICD-10-CM | POA: Diagnosis not present

## 2018-09-21 ENCOUNTER — Telehealth: Payer: Self-pay | Admitting: Acute Care

## 2018-09-21 ENCOUNTER — Ambulatory Visit: Payer: PPO | Admitting: Acute Care

## 2018-09-21 ENCOUNTER — Encounter: Payer: Self-pay | Admitting: Acute Care

## 2018-09-21 VITALS — BP 138/80 | HR 60 | Ht 65.0 in | Wt 215.8 lb

## 2018-09-21 DIAGNOSIS — Z9989 Dependence on other enabling machines and devices: Secondary | ICD-10-CM

## 2018-09-21 DIAGNOSIS — J84112 Idiopathic pulmonary fibrosis: Secondary | ICD-10-CM

## 2018-09-21 DIAGNOSIS — J9611 Chronic respiratory failure with hypoxia: Secondary | ICD-10-CM | POA: Diagnosis not present

## 2018-09-21 DIAGNOSIS — G4733 Obstructive sleep apnea (adult) (pediatric): Secondary | ICD-10-CM

## 2018-09-21 DIAGNOSIS — J849 Interstitial pulmonary disease, unspecified: Secondary | ICD-10-CM

## 2018-09-21 DIAGNOSIS — I2721 Secondary pulmonary arterial hypertension: Secondary | ICD-10-CM | POA: Diagnosis not present

## 2018-09-21 DIAGNOSIS — J449 Chronic obstructive pulmonary disease, unspecified: Secondary | ICD-10-CM

## 2018-09-21 NOTE — Patient Instructions (Addendum)
It is nice to meet you today. Continue on CPAP at bedtime. You appear to be benefiting from the treatment Goal is to wear for at least 6 hours each night for maximal clinical benefit. Continue to work on weight loss, as the link between excess weight  and sleep apnea is well established.  Do not drive if sleepy. Remember to clean mask, tubing, filter, and reservoir once weekly with soapy water.  We will send in a prescription for the green connector oxygen tubing 25 feet in length to [facility] in [place]  as  Needed. Continue wearing your oxygen at 3 L as you have been doing. Saturation goals are 88-92% Follow up with Dr. Lake Bells or Judson Roch NP in 2 months. Call Dr. Claris Gladden office and let them know you are having additional fluid retention. He may want you to take extra doses. Remember to use a low salt diet. Try Mrs. Dash in place of salt. Look for snacks that are low in salty. Weigh yourself every day in the morning after you void. Please contact office for sooner follow up if symptoms do not improve or worsen or seek emergency care

## 2018-09-21 NOTE — Telephone Encounter (Signed)
I will send the order once it's signed

## 2018-09-21 NOTE — Telephone Encounter (Signed)
I called Shelley [name] but there was no answer. I left a VM for her to call us back.

## 2018-09-21 NOTE — Assessment & Plan Note (Signed)
Admits to being non-compliant with Lasix She is non-compliant with low salt diet She does not weigh daily Plan We discussed the consequences of medical non-compliance Take Lasix and aldactone  daily without fail Follow a low salt diet Weigh daily after you wake up and void ( same time each day) If you are going to be away from home, take lasix upon returning home, do not skip dose.

## 2018-09-21 NOTE — Assessment & Plan Note (Addendum)
Compliant with OFEV Compliant with oxygen Therapy Plan: Continue wearing your oxygen at 3 L as you have been doing. Saturation goals are 88-92% We will send in a prescription for the green connector oxygen tubing 25 feet in length to [facility] in [place]  as  Needed. PFT's 01/2019 Work on increasing your activity Follow up with Dr. Lake Bells 10/2018 with therapeutic drug monitoring Please contact office for sooner follow up if symptoms do not improve or worsen or seek emergency care

## 2018-09-21 NOTE — Assessment & Plan Note (Signed)
  Reviewed the risks and health consequences of now wearing oxygen in setting of IPF/ PAH Plan: Continue wearing oxygen at 3 L [place] at all times Saturation goals are 88-92% We will send in a prescription for the green connector oxygen tubing 25 feet in length to [facility] in [place]  as  Needed.

## 2018-09-21 NOTE — Assessment & Plan Note (Signed)
Continue albuterol nebulizer treatments every 6 hours for wheezing and shortness of breath Wear oxygen to maintain saturation goals of 88-92%

## 2018-09-21 NOTE — Telephone Encounter (Signed)
Spoke with Doroteo Bradford and she states the pt is not a patient there at [facility]. This was a mistake on my end and her DME company is [facility]. PCC's can you send order to [facility]? I placed another order in the system. Thanks.

## 2018-09-21 NOTE — Progress Notes (Signed)
foll

## 2018-09-21 NOTE — Progress Notes (Signed)
History of Present Illness Shelley [name] is a 78 y.o. female with COPD and OSA.She is followed y Shelley [name]. Fayne [name].  Synopsis Referred in March 2019 by Shelley [name]. Aundra [name] for COPD and a CT chest showing UIP.  She has pulmonary hypertension and CAD.  She had cardiac stents placed in 2004 by Shelley [name]. Lia [name].  She developed leg swelling in January 2019 and was found to have pulmonary hypertension.  She also has a history of Crohn's disease. 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  She quit smoking in 2019.  Ofev started 12/2017.   09/21/2018 Compliance visit for CPAP per insurance request. Pt. Presents today for insurance requested compliance check for CPAP. Pt had been having a very difficult time with her previous mask. She went to [name] 08/31/2018  and had a mask fitting. She has subsequently been switched  to a Small Shelley [name] F30I Nasal/Oral mask. She states the mask de-sensensitation has resolved her mask issues.Since she has had  the [facility] mask she has been compliant with her CPAP every night with the exception of one night. (09/06/2018-09/20/2018). She is wearing the device for an average of 9 hours per night. She states she has had less daytime sleepiness, and has not had headaches upon awakening. Per her daughter she is not compliant with her lasix. She has not taken it for the last 2 days. She does not follow a low salt diet. Weigh is up about 4 pounds. She endorses a cough that is non-productive. She states she has been compliant with her oxygen. She has been compliant with her OFEV She denies fever, chest pain, hemoptysis, or orthopnea.  Test Results: Chest imaging: CTA chest (1/19): No PE, air trapping noted, some centrilobular emphysema noted, some paraseptal emphysema noted, fibrotic change in the periphery and appears to be worse in the bases V/Q scan (3/19): No evidence for chronic PE. HRCT 10/2017: images independently reviewed: peripheral based recitulation, interlobular thickening and  honeycombing worse in the bases, some traction bronchiectasis, some areas of preserved lung noted. Per radiology probably UIP, possible fibrotic NSIP; by Shelley [name] read this is consistent with UIP; paratracheal adenopathy, thyroid nodule 2 cm, cardiomegally   PFT: - PFTs (3/19): Ratio 63%, FVC 1.86L FEV1 1.28 L 61% predicted, total lung capacity 4.2 L 80% predicted, DLCO 8.10 31% predicted December 2019: FVC 1.75 L 62% predicted, DLCO 6.9 mL 26% predicted  Labs: 10/2017: ANA neg, SCL 70 neg, Anti-Jo-1 neg, Centromere neg, SSA/SSB neg, RF neg, HP panel neg  Echo: Echo (1/19) with EF 55-60%, mild LVH, mild MR, moderate RV dilation with moderately decreased systolic function, moderate TR, PASP 74 mmHg.   Heart Catheterization: - RHC (3/19): mean RA 2, PA 54/16 mean 28, mean PCWP 8, CI 2.55, PVR 3.9 WU.  Sleep study: July 2019 home sleep test showed moderate obstructive sleep apnea with an AHI of 28, O2 saturation nadir 75%, 700 minutes below 89     Down Load 09/06/2018-09/20/2018 Air Sense 10 AutoSet 5 cm H2O  to 20 cm H2O Usage 14 of 15 nights>> 93% Average usage >> 9 hours 52 minutes Median  Pressure>> 12.6 cm H2O AHI 2.5    CBC Latest Ref Rng & Units 08/13/2018 07/27/2018 10/30/2017  WBC 4.0 - 10.5 K/uL 4.7 5.9 5.9  Hemoglobin 12.0 - 15.0 g/dL 12.9 13.4 15.3(H)  Hematocrit 36.0 - 46.0 % 41.8 40.8 47.0(H)  Platelets 150 - 400 K/uL 184 203.0 206    BMP Latest Ref Rng & Units 08/13/2018 07/27/2018  05/07/2018  Glucose 70 - 99 mg/dL 85 96 110(H)  BUN 8 - 23 mg/dL _0 Creatinine 0.44 - 1.00 mg/dL 0.99 0.87 0.71  Sodium 135 - 145 mmol/L 138 140 138  Potassium 3.5 - 5.1 mmol/L 3.5 4.1 5.0  Chloride 98 - 111 mmol/L 107 104 107  CO2 22 - 32 mmol/L _1 Calcium 8.9 - 10.3 mg/dL 8.6(L) 9.5 9.5    BNP No results found for: BNP  ProBNP No results found for: PROBNP  PFT    Component Value Date/Time   FEV1PRE 1.36 07/27/2018 1343   FEV1POST 1.28 10/29/2017 0824    FVCPRE 1.75 07/27/2018 1343   FVCPOST 2.01 10/29/2017 0824   TLC 3.78 07/27/2018 1343   DLCOUNC 6.86 07/27/2018 1343   PREFEV1FVCRT 78 07/27/2018 1343   PSTFEV1FVCRT 63 10/29/2017 0824    No results found.   Past medical hx Past Medical History:  Diagnosis Date  . Anxiety   . Carotid artery disease (Shelley [name])    92-92% RICA and LICA 4/46 - Shelley [name]. [place] [name]  . Cataract   . COPD (chronic obstructive pulmonary disease) (Shelley [name])   . Coronary atherosclerosis of native coronary artery    BMS to RCA 2004  . Depression   . DVT (deep venous thrombosis) (Shelley [name])   . Essential hypertension, benign   . Fracture Left Great Toe  . Hepatic steatosis   . History of melanoma   . History of oral aphthous ulcers   . History of Salmonella gastroenteritis   . Hx of adenomatous colonic polyps   . Mixed hyperlipidemia   . Myocardial infarction (Shelley [name])    IMI 10/04  . Osteoarthritis   . Osteoporosis   . Pancreatitis   . Rectal bleeding   . Regional enteritis of large intestine (Shelley [name])   . Sinus polyp   . TIA (transient ischemic attack)   . Tubular adenoma of colon 08/2012     Social History   Tobacco Use  . Smoking status: Former Smoker    Packs/day: 1.00    Years: 40.00    Pack years: 40.00    Types: Cigarettes    Start date: 06/02/1957    Last attempt to quit: 09/2017    Years since quitting: 0.9  . Smokeless tobacco: Never Used  Substance Use Topics  . Alcohol use: No    Alcohol/week: 0.0 standard drinks  . Drug use: No    [name] reports that she quit smoking about a year ago. Her smoking use included cigarettes. She started smoking about 61 years ago. She has a 40.00 pack-year smoking history. She has never used smokeless tobacco. She reports that she does not drink alcohol or use drugs.  Tobacco Cessation: Former smoker with a 40 pack year smoking history, quit 2019  Past surgical hx, Family hx, Social hx all reviewed.  Current Outpatient Medications on File Prior to Visit  Medication  Sig  . albuterol (PROVENTIL) (2.5 MG/3ML) 0.083% nebulizer solution Take 3 mLs (2.5 mg total) by nebulization every 6 (six) hours. Dx COPD J44.9  . antiseptic oral rinse (BIOTENE) LIQD 15 mLs by Mouth Rinse route as needed for dry mouth.  Marland Kitchen aspirin 81 MG tablet Take 81 mg by mouth daily.    . Cyanocobalamin (B-12 PO) Take 1 tablet by mouth See admin instructions. Take 1 capsule every day x 7 days every other week   . dicyclomine (BENTYL) 10 MG capsule Take 1 capsule (10 mg total) by mouth as needed for  spasms.  . ferrous sulfate 325 (65 FE) MG tablet Take 325 mg by mouth daily.  Marland Kitchen Fexofenadine HCl (ALLEGRA ALLERGY PO) Take 1 tablet by mouth daily as needed (for allergies).   Marland Kitchen FLUoxetine (PROZAC) 40 MG capsule Take 40 mg by mouth daily.  . folic acid (FOLVITE) 1 MG tablet Take 1 tablet (1 mg total) by mouth daily.  . furosemide (LASIX) 40 MG tablet Take 1.5 tablets (60 mg total) by mouth daily.  Marland Kitchen gabapentin (NEURONTIN) 100 MG capsule Take two 126m tablets twice daily  . HYDROcodone-acetaminophen (NORCO) 10-325 MG tablet Take 1 tablet by mouth every 6 (six) hours as needed for moderate pain.   . metoprolol succinate (TOPROL-XL) 25 MG 24 hr tablet TAKE 1 TABLET BY MOUTH EVERY DAY  . Misc Natural Products (OSTEO BI-FLEX JOINT SHIELD PO) Take 1 capsule by mouth daily.  . Nintedanib (OFEV) 100 MG CAPS Take 100 mg by mouth 2 (two) times daily.  . nitroGLYCERIN (NITROSTAT) 0.4 MG SL tablet Place 1 tablet (0.4 mg total) under the tongue every 5 (five) minutes x 3 doses as needed.  . Omega-3 Fatty Acids (FISH OIL) 1200 MG CAPS Take 1,200 mg by mouth daily.   . potassium chloride SA (K-DUR,KLOR-CON) 20 MEQ tablet Take 1 tablet (20 mEq total) by mouth daily.  . rivaroxaban (XARELTO) 2.5 MG TABS tablet Take 1 tablet (2.5 mg total) by mouth 2 (two) times daily.  . rosuvastatin (CRESTOR) 10 MG tablet Take 1 tablet (10 mg total) by mouth every other day.  . spironolactone (ALDACTONE) 25 MG tablet Take 0.5  tablets (12.5 mg total) by mouth daily.  .Marland KitchensulfaSALAzine (AZULFIDINE) 500 MG tablet Take 1 tablet (500 mg total) by mouth 2 (two) times daily.   No current facility-administered medications on file prior to visit.      Allergies  Allergen Reactions  . Doxycycline Other (See Comments)    Poss. Photosensitivity  . Mercaptopurine Other (See Comments)    REACTION: pancreatitis  . Simvastatin Other (See Comments)    Severe leg aches  . Sulfasalazine   . Penicillins Rash and Other (See Comments)    Has patient had a PCN reaction causing immediate rash, facial/tongue/throat swelling, SOB or lightheadedness with hypotension: No Has patient had a PCN reaction causing severe rash involving mucus membranes or skin necrosis: No Has patient had a PCN reaction that required hospitalization: No Has patient had a PCN reaction occurring within the last 10 years: No If all of the above answers are "NO", then may proceed with Cephalosporin use.   . Sulfa Antibiotics Rash    Review Of Systems:  Constitutional:   No  weight loss, night sweats,  Fevers, chills,+ fatigue, or  lassitude.  HEENT:   No headaches,  Difficulty swallowing,  Tooth/dental problems, or  Sore throat,                No sneezing, itching, ear ache, nasal congestion, post nasal drip,   CV:  No chest pain,  Orthopnea, PND, ++swelling in lower extremities, anasarca, dizziness, palpitations, syncope.   GI  No heartburn, indigestion, abdominal pain, nausea, vomiting, diarrhea, change in bowel habits, loss of appetite, bloody stools.   Resp: + shortness of breath with exertion or at rest.  No excess mucus, no productive cough,  + non-productive cough,  No coughing up of blood.  No change in color of mucus.  No wheezing.  No chest wall deformity  Skin: no rash or lesions.  GU: no  dysuria, change in color of urine, no urgency or frequency.  No flank pain, no hematuria   MS:  No joint pain or swelling.  No decreased range of motion.   No back pain.  Psych:  No change in mood or affect. No depression or anxiety.  No memory loss.   Vital Signs BP 138/80 (BP Location: Left Arm, Cuff Size: Large)   Pulse 60   Ht _0  (1.651 m)   Wt 215 lb 12.8 oz (97.9 kg)   SpO2 92%   BMI 35.91 kg/m    Physical Exam:  General- No distress,  A&Ox3, pleasant ENT: No sinus tenderness, TM clear, pale nasal mucosa, no oral exudate,no post nasal drip, no LAN Cardiac: S1, S2, regular rate and rhythm, no murmur Chest: No wheeze/ rales/ dullness; no accessory muscle use, no nasal flaring, no sternal retractions, + crackles per bases bilaterally, + for non-productive cough Abd.: Soft Non-tender, ND, BS +Body mass index is 35.91 kg/m. Ext: No clubbing cyanosis, + BLE edema 2+ Neuro:  Cranial nerves intact, deconditioned at baseline Skin: No rashes, warm and dry Psych: normal mood and behavior, pleasant and appropriate   Assessment/Plan  OSA on CPAP  Compliant since mask de-sensitization Plan: Continue on CPAP at bedtime. You appear to be benefiting from the treatment Goal is to wear for at least 6 hours each night for maximal clinical benefit. Continue to work on weight loss, as the link between excess weight  and sleep apnea is well established.  Do not drive if sleepy. Remember to clean mask, tubing, filter, and reservoir once weekly with soapy water.    IPF (idiopathic pulmonary fibrosis) ([place]) Compliant with OFEV Compliant with oxygen Therapy Plan: Continue wearing your oxygen at 3 L as you have been doing. Saturation goals are 88-92% We will send in a prescription for the green connector oxygen tubing 25 feet in length to [facility] in [place]  as  Needed. PFT's 01/2019 Work on increasing your activity Follow up with Shelley [name]. [place] Bells 10/2018 with therapeutic drug monitoring Please contact office for sooner follow up if symptoms do not improve or worsen or seek emergency care     Chronic respiratory failure with  hypoxia [facility])  Reviewed the risks and health consequences of now wearing oxygen in setting of IPF/ PAH Plan: Continue wearing oxygen at 3 L [place] at all times Saturation goals are 88-92% We will send in a prescription for the green connector oxygen tubing 25 feet in length to [facility] in [place]  as  Needed.  COPD without exacerbation ([place]) Continue albuterol nebulizer treatments every 6 hours for wheezing and shortness of breath Wear oxygen to maintain saturation goals of 88-92%    Magdalen Spatz, NP 09/21/2018  9:18 PM

## 2018-09-21 NOTE — Assessment & Plan Note (Addendum)
  Compliant since mask de-sensitization Plan: Continue on CPAP at bedtime. You appear to be benefiting from the treatment Goal is to wear for at least 6 hours each night for maximal clinical benefit. Continue to work on weight loss, as the link between excess weight  and sleep apnea is well established.  Do not drive if sleepy. Remember to clean mask, tubing, filter, and reservoir once weekly with soapy water.

## 2018-09-22 NOTE — Telephone Encounter (Signed)
Referral Notes  Number of Notes: 2  Type Date User Summary Attachment  General 09/21/2018 4:35 PM Harland German - -  Note   Waiting on signature        Type Date User Summary Attachment  Provider Comments 09/21/2018 4:23 PM Jannette Spanner, CMA Provider Comments -  Note   Green connecter oxygen tubing 25 feet in length/[facility]          Sarah, please sign off on order. Thanks

## 2018-09-22 NOTE — Progress Notes (Signed)
Reviewed, agree 

## 2018-09-23 NOTE — Telephone Encounter (Signed)
Patient returning phone call.  Patient phone number is [phone].

## 2018-09-23 NOTE — Telephone Encounter (Signed)
Called and spoke with patient regarding cpap supplies She states that her cats chews her cpap and O2 tubing in need of new ones right away She is a patient with [facility], called them it would take 3-4 days for delivery Patient did not want to be without O2 nor cpap for that long period Therefore patient's daughter called [facility] to see if they could come by there to pick up the supplies [place] stated since she is not a patient with them, she will need to have order sent over I asked patient if she would like to switch DME companies from [facility] to [facility].  Pt stated no, she wants to stay with [facility] This matter happened 2 weeks ago, patient has new tubing and needs no new supplies at this time. Nothing further needed.

## 2018-09-23 NOTE — Telephone Encounter (Signed)
She is not a patient at [facility] but [facility] will not give her what she needs. I called pt to discuss to see if she wanted to transfer services to [facility] since she is closer to them. ATC pt, no answer. Left message for pt to call back.

## 2018-09-23 NOTE — Telephone Encounter (Signed)
Shelley [name] is calling the patient to see if she wants to switch DME to [place] which is closer to where she lives .

## 2018-09-29 ENCOUNTER — Telehealth: Payer: Self-pay | Admitting: Pulmonary Disease

## 2018-09-29 DIAGNOSIS — R2689 Other abnormalities of gait and mobility: Secondary | ICD-10-CM | POA: Diagnosis not present

## 2018-09-29 DIAGNOSIS — R0602 Shortness of breath: Secondary | ICD-10-CM | POA: Diagnosis not present

## 2018-09-29 DIAGNOSIS — I5032 Chronic diastolic (congestive) heart failure: Secondary | ICD-10-CM | POA: Diagnosis not present

## 2018-09-29 DIAGNOSIS — J449 Chronic obstructive pulmonary disease, unspecified: Secondary | ICD-10-CM | POA: Diagnosis not present

## 2018-09-29 DIAGNOSIS — G4733 Obstructive sleep apnea (adult) (pediatric): Secondary | ICD-10-CM | POA: Diagnosis not present

## 2018-09-29 NOTE — Telephone Encounter (Signed)
Noted

## 2018-09-29 NOTE — Telephone Encounter (Signed)
Spoke with Sonia Baller and she confirmed that pt needs to start over due to non-compliance.  Spoke with pt and she was offered earlier appoinment but not able to make it.  Has appt with Dr. Lake Bells on 11/03/18.  She will call [facility] and see if they need to pick up machine so she is not charged.  Nothing further is needed.  Routing to Dr. [place] Bells for [place].

## 2018-09-29 NOTE — Telephone Encounter (Signed)
Sonia Baller called in stating the pt has reached her 90 day audit and is not compliant with her CPAP. Sonia Baller is stating the pt is needing a new office visit, diagnostic sleep study, and new rx for CPAP.  Per Sonia Baller, pt is willing to use CPAP.   Will forward to triage to follow.

## 2018-10-02 NOTE — Telephone Encounter (Signed)
Will close encounter, as nothing further is needed at this time.

## 2018-10-05 DIAGNOSIS — R7301 Impaired fasting glucose: Secondary | ICD-10-CM | POA: Diagnosis not present

## 2018-10-05 DIAGNOSIS — K5900 Constipation, unspecified: Secondary | ICD-10-CM | POA: Diagnosis not present

## 2018-10-05 DIAGNOSIS — K5901 Slow transit constipation: Secondary | ICD-10-CM | POA: Diagnosis not present

## 2018-10-12 ENCOUNTER — Other Ambulatory Visit (HOSPITAL_COMMUNITY): Payer: Self-pay | Admitting: Cardiology

## 2018-10-19 DIAGNOSIS — I5032 Chronic diastolic (congestive) heart failure: Secondary | ICD-10-CM | POA: Diagnosis not present

## 2018-10-19 DIAGNOSIS — R2689 Other abnormalities of gait and mobility: Secondary | ICD-10-CM | POA: Diagnosis not present

## 2018-10-19 DIAGNOSIS — R0602 Shortness of breath: Secondary | ICD-10-CM | POA: Diagnosis not present

## 2018-10-26 ENCOUNTER — Ambulatory Visit: Payer: PPO | Admitting: Pulmonary Disease

## 2018-10-28 DIAGNOSIS — I5032 Chronic diastolic (congestive) heart failure: Secondary | ICD-10-CM | POA: Diagnosis not present

## 2018-10-28 DIAGNOSIS — R2689 Other abnormalities of gait and mobility: Secondary | ICD-10-CM | POA: Diagnosis not present

## 2018-10-28 DIAGNOSIS — J449 Chronic obstructive pulmonary disease, unspecified: Secondary | ICD-10-CM | POA: Diagnosis not present

## 2018-10-28 DIAGNOSIS — R062 Wheezing: Secondary | ICD-10-CM | POA: Diagnosis not present

## 2018-11-03 ENCOUNTER — Encounter: Payer: Self-pay | Admitting: Pulmonary Disease

## 2018-11-03 ENCOUNTER — Ambulatory Visit (HOSPITAL_BASED_OUTPATIENT_CLINIC_OR_DEPARTMENT_OTHER)
Admission: RE | Admit: 2018-11-03 | Discharge: 2018-11-03 | Disposition: A | Discharge status: Home / Self Care | Payer: PPO | Source: Ambulatory Visit | Attending: Cardiology | Admitting: Cardiology

## 2018-11-03 ENCOUNTER — Ambulatory Visit: Payer: PPO | Admitting: Pulmonary Disease

## 2018-11-03 ENCOUNTER — Encounter (HOSPITAL_COMMUNITY): Payer: Self-pay | Admitting: Cardiology

## 2018-11-03 ENCOUNTER — Ambulatory Visit (HOSPITAL_COMMUNITY)
Admission: RE | Admit: 2018-11-03 | Discharge: 2018-11-03 | Disposition: A | Discharge status: Home / Self Care | Payer: PPO | Source: Ambulatory Visit | Attending: Internal Medicine | Admitting: Internal Medicine

## 2018-11-03 VITALS — BP 132/80 | HR 50 | Wt 218.4 lb

## 2018-11-03 VITALS — BP 110/58 | HR 66 | Ht 65.0 in | Wt 214.6 lb

## 2018-11-03 DIAGNOSIS — Z7901 Long term (current) use of anticoagulants: Secondary | ICD-10-CM | POA: Insufficient documentation

## 2018-11-03 DIAGNOSIS — J84112 Idiopathic pulmonary fibrosis: Secondary | ICD-10-CM | POA: Diagnosis not present

## 2018-11-03 DIAGNOSIS — I25119 Atherosclerotic heart disease of native coronary artery with unspecified angina pectoris: Secondary | ICD-10-CM

## 2018-11-03 DIAGNOSIS — E785 Hyperlipidemia, unspecified: Secondary | ICD-10-CM | POA: Insufficient documentation

## 2018-11-03 DIAGNOSIS — Z79899 Other long term (current) drug therapy: Secondary | ICD-10-CM | POA: Insufficient documentation

## 2018-11-03 DIAGNOSIS — K5090 Crohn's disease, unspecified, without complications: Secondary | ICD-10-CM | POA: Insufficient documentation

## 2018-11-03 DIAGNOSIS — J9611 Chronic respiratory failure with hypoxia: Secondary | ICD-10-CM

## 2018-11-03 DIAGNOSIS — Z9989 Dependence on other enabling machines and devices: Secondary | ICD-10-CM

## 2018-11-03 DIAGNOSIS — I2721 Secondary pulmonary arterial hypertension: Secondary | ICD-10-CM | POA: Diagnosis not present

## 2018-11-03 DIAGNOSIS — I2510 Atherosclerotic heart disease of native coronary artery without angina pectoris: Secondary | ICD-10-CM | POA: Diagnosis not present

## 2018-11-03 DIAGNOSIS — J449 Chronic obstructive pulmonary disease, unspecified: Secondary | ICD-10-CM | POA: Diagnosis not present

## 2018-11-03 DIAGNOSIS — I5032 Chronic diastolic (congestive) heart failure: Secondary | ICD-10-CM | POA: Diagnosis not present

## 2018-11-03 DIAGNOSIS — I110 Hypertensive heart disease with heart failure: Secondary | ICD-10-CM | POA: Insufficient documentation

## 2018-11-03 DIAGNOSIS — Z86718 Personal history of other venous thrombosis and embolism: Secondary | ICD-10-CM | POA: Diagnosis not present

## 2018-11-03 DIAGNOSIS — I779 Disorder of arteries and arterioles, unspecified: Secondary | ICD-10-CM | POA: Diagnosis not present

## 2018-11-03 DIAGNOSIS — F329 Major depressive disorder, single episode, unspecified: Secondary | ICD-10-CM | POA: Insufficient documentation

## 2018-11-03 DIAGNOSIS — Z85820 Personal history of malignant melanoma of skin: Secondary | ICD-10-CM | POA: Insufficient documentation

## 2018-11-03 DIAGNOSIS — I071 Rheumatic tricuspid insufficiency: Secondary | ICD-10-CM | POA: Insufficient documentation

## 2018-11-03 DIAGNOSIS — G4733 Obstructive sleep apnea (adult) (pediatric): Secondary | ICD-10-CM | POA: Diagnosis not present

## 2018-11-03 DIAGNOSIS — M1990 Unspecified osteoarthritis, unspecified site: Secondary | ICD-10-CM | POA: Diagnosis not present

## 2018-11-03 DIAGNOSIS — Z8249 Family history of ischemic heart disease and other diseases of the circulatory system: Secondary | ICD-10-CM | POA: Diagnosis not present

## 2018-11-03 DIAGNOSIS — Z8673 Personal history of transient ischemic attack (TIA), and cerebral infarction without residual deficits: Secondary | ICD-10-CM | POA: Diagnosis not present

## 2018-11-03 DIAGNOSIS — J849 Interstitial pulmonary disease, unspecified: Secondary | ICD-10-CM | POA: Diagnosis not present

## 2018-11-03 DIAGNOSIS — Z87891 Personal history of nicotine dependence: Secondary | ICD-10-CM | POA: Diagnosis not present

## 2018-11-03 DIAGNOSIS — Z7982 Long term (current) use of aspirin: Secondary | ICD-10-CM | POA: Insufficient documentation

## 2018-11-03 DIAGNOSIS — I272 Pulmonary hypertension, unspecified: Secondary | ICD-10-CM | POA: Diagnosis not present

## 2018-11-03 LAB — BASIC METABOLIC PANEL
ANION GAP: 9 (ref 5–15)
BUN: 20 mg/dL (ref 8–23)
CO2: 25 mmol/L (ref 22–32)
Calcium: 9.1 mg/dL (ref 8.9–10.3)
Chloride: 106 mmol/L (ref 98–111)
Creatinine, Ser: 0.95 mg/dL (ref 0.44–1.00)
GFR calc Af Amer: >60 mL/min (ref >60)
GFR, EST NON AFRICAN AMERICAN: 57 mL/min — AB (ref >60)
Glucose, Bld: 94 mg/dL (ref 70–99)
POTASSIUM: 3.2 mmol/L — AB (ref 3.5–5.1)
Sodium: 140 mmol/L (ref 135–145)

## 2018-11-03 MED ORDER — FUROSEMIDE 40 MG PO TABS: 60.0000 mg | ORAL_TABLET | Freq: Two times a day (BID) | ORAL | 3 refills | Status: DC

## 2018-11-03 MED ORDER — POTASSIUM CHLORIDE CRYS ER 20 MEQ PO TBCR: 20.0000 meq | EXTENDED_RELEASE_TABLET | Freq: Every day | ORAL | 3 refills | Status: DC

## 2018-11-03 MED ORDER — SPIRONOLACTONE 25 MG PO TABS: 25.0000 mg | ORAL_TABLET | Freq: Every day | ORAL | 6 refills | Status: DC

## 2018-11-03 NOTE — Progress Notes (Signed)
  Echocardiogram 2D Echocardiogram has been performed.  Shelley [name] 11/03/2018, 10:43 AM

## 2018-11-03 NOTE — Progress Notes (Signed)
Subjective:   PATIENT ID: Shelley [name] GENDER: female DOB: Dec 25, 1940, MRN: [id number]  Synopsis: Referred in March 2019 by Dr. Aundra Dubin for COPD and a CT chest showing UIP.  She has pulmonary hypertension and CAD.  She had cardiac stents placed in 2004 by Dr. Lia Foyer.  She developed leg swelling in January 2019 and was found to have pulmonary hypertension.  She also has a history of Crohn's disease. 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  She quit smoking in 2019.  Ofev started 12/2017.   HPI  Chief Complaint  Patient presents with  . Follow-up    remains SOB - no CPAP now   Dyspnea remains a problem for her. Weight is up by about 12 pounds. Had her furosemide adjusted today by cardiology. Had an office visit with [place] to help coordinate CPAP care, went to go see the mask fitting folks and got a new mask.  But by this point her insurance company discontinued coverage for her CPAP.  She said that when she had the new mask she was actually able to wear CPAP nightly and was doing well with it.  However, she was forced to turn it back in because her insurance company refused coverage.   Past Medical History:  Diagnosis Date  . Anxiety   . Carotid artery disease ([place])    48-01% RICA and LICA 6/55 - Dr. Oneida Alar  . Cataract   . COPD (chronic obstructive pulmonary disease) ([place])   . Coronary atherosclerosis of native coronary artery    BMS to RCA 2004  . Depression   . DVT (deep venous thrombosis) ([place])   . Essential hypertension, benign   . Fracture Left Great Toe  . Hepatic steatosis   . History of melanoma   . History of oral aphthous ulcers   . History of Salmonella gastroenteritis   . Hx of adenomatous colonic polyps   . Mixed hyperlipidemia   . Myocardial infarction ([place])    IMI 10/04  . Osteoarthritis   . Osteoporosis   . Pancreatitis   . Rectal bleeding   . Regional enteritis of large intestine ([place])   . Sinus polyp   . TIA (transient ischemic attack)   . Tubular  adenoma of colon 08/2012        Review of Systems  Constitutional: Negative for chills, fever, malaise/fatigue and weight loss.  HENT: Negative for congestion, nosebleeds, sinus pain and sore throat.   Eyes: Negative for photophobia, pain and discharge.  Respiratory: Positive for cough, sputum production and shortness of breath. Negative for hemoptysis and wheezing.   Cardiovascular: Positive for leg swelling. Negative for chest pain, palpitations and orthopnea.  Gastrointestinal: Negative for abdominal pain, constipation, diarrhea, nausea and vomiting.  Genitourinary: Negative for dysuria, frequency, hematuria and urgency.  Musculoskeletal: Negative for back pain, joint pain, myalgias and neck pain.  Skin: Negative for itching and rash.  Neurological: Negative for tingling, tremors, sensory change, speech change, focal weakness, seizures, weakness and headaches.  Psychiatric/Behavioral: Negative for memory loss, substance abuse and suicidal ideas. The patient is not nervous/anxious.       Objective:  Physical Exam   Vitals:   11/03/18 1412  BP: (!) 110/58  Pulse: 66  SpO2: 90%  Weight: 214 lb 9.6 oz (97.3 kg)  Height: _0  (1.651 m)   4LNC  Gen: obese, chronically ill appearing HENT: OP clear, TM's clear, neck supple PULM: Crackles bases B, normal percussion CV: RRR, no mgr, trace edema  GI: BS+, soft, nontender Derm: no cyanosis or rash Psyche: normal mood and affect     CBC    Component Value Date/Time   WBC 4.7 08/13/2018 1532   RBC 3.95 08/13/2018 1532   HGB 12.9 08/13/2018 1532   HCT 41.8 08/13/2018 1532   PLT 184 08/13/2018 1532   MCV 105.8 (H) 08/13/2018 1532   MCH 32.7 08/13/2018 1532   MCHC 30.9 08/13/2018 1532   RDW 13.5 08/13/2018 1532   LYMPHSABS 1.4 07/27/2018 1609   MONOABS 0.5 07/27/2018 1609   EOSABS 0.1 07/27/2018 1609   BASOSABS 0.0 07/27/2018 1609     Chest imaging: CTA chest (1/19): No PE, air trapping noted, some centrilobular  emphysema noted, some paraseptal emphysema noted, fibrotic change in the periphery and appears to be worse in the bases V/Q scan (3/19): No evidence for chronic PE.  HRCT 10/2017: images independently reviewed: peripheral based recitulation, interlobular thickening and honeycombing worse in the bases, some traction bronchiectasis, some areas of preserved lung noted. Per radiology probably UIP, possible fibrotic NSIP; by [facility] read this is consistent with UIP; paratracheal adenopathy, thyroid nodule 2 cm, cardiomegally   PFT: - PFTs (3/19): Ratio 63%, FVC 1.86L FEV1 1.28 L 61% predicted, total lung capacity 4.2 L 80% predicted, DLCO 8.10 31% predicted December 2019: FVC 1.75 L 62% predicted, DLCO 6.9 mL 26% predicted  Labs: 10/2017: ANA neg, SCL 70 neg, Anti-Jo-1 neg, Centromere neg, SSA/SSB neg, RF neg, HP panel neg  Path:  Echo:  Echo (1/19) with EF 55-60%, mild LVH, mild MR, moderate RV dilation with moderately decreased systolic function, moderate TR, PASP 74 mmHg.   Heart Catheterization: - RHC (3/19): mean RA 2, PA 54/16 mean 28, mean PCWP 8, CI 2.55, PVR 3.9 WU.  Sleep study: July 2019 home sleep test showed moderate obstructive sleep apnea with an AHI of 28, O2 saturation nadir 75%, 700 minutes below 89   Records from her January 2020 visit with our nurse practitioner reviewed where she was seen for sleep apnea, IPF, chronic respiratory failure with hypoxemia, and COPD: She was continued on over of, plans were made for a lung function test in June of this year, CPAP was continued, oxygen was continued.    Assessment & Plan:   No diagnosis found.  Discussion: Her pulmonary hypertension is worse which I think is due in part to the fact that she has not been using CPAP since her insurance company discontinued coverage for it.  I also think that her retaining fluid recently contributes to this.  Plan: Idiopathic pulmonary fibrosis: Continue Ofev Liver function testing to monitor  for drug toxicity Repeat pulmonary function testing in June  Chronic respiratory failure with hypoxemia: Keep using oxygen as she is doing  COPD: Continue albuterol every 6 hours  Pulmonary hypertension: Increased dose of Lasix as directed by the cardiology clinic  Obstructive sleep apnea: The patient has been compliant with the request that we had sent for her to have a new mask but unfortunately her insurance company has denied coverage.  I think it would be most reasonable for them to cover the CPAP again rather than having her go through a polysomnogram again as that would cause excessive cost and is not medically necessary.  We will reorder CPAP and see if her insurance company will act in her best interest.  Follow up in 3 months   Current Outpatient Medications:  .  albuterol (PROVENTIL) (2.5 MG/3ML) 0.083% nebulizer solution, Take 3 mLs (2.5 mg  total) by nebulization every 6 (six) hours. Dx COPD J44.9, Disp: 75 mL, Rfl: 11 .  antiseptic oral rinse (BIOTENE) LIQD, 15 mLs by Mouth Rinse route as needed for dry mouth., Disp: 1 Bottle, Rfl: 2 .  aspirin 81 MG tablet, Take 81 mg by mouth daily.  , Disp: , Rfl:  .  calcium citrate-vitamin D (CITRACAL+D) 315-200 MG-UNIT tablet, Take 1 tablet by mouth 2 (two) times daily., Disp: , Rfl:  .  Cyanocobalamin (B-12 PO), Take 1 tablet by mouth See admin instructions. Take 1 capsule every day x 7 days every other week , Disp: , Rfl:  .  ferrous sulfate 325 (65 FE) MG tablet, Take 325 mg by mouth daily., Disp: , Rfl:  .  FLUoxetine (PROZAC) 40 MG capsule, Take 40 mg by mouth daily., Disp: , Rfl:  .  folic acid (FOLVITE) 1 MG tablet, Take 1 tablet (1 mg total) by mouth daily., Disp: 90 tablet, Rfl: 4 .  furosemide (LASIX) 40 MG tablet, Take 1.5 tablets (60 mg total) by mouth 2 (two) times daily. (Patient taking differently: Take 40 mg by mouth 2 (two) times daily. Two tabs twice daily), Disp: 45 tablet, Rfl: 3 .  gabapentin (NEURONTIN) 100 MG  capsule, Take two 150m tablets twice daily, Disp: 120 capsule, Rfl: 6 .  HYDROcodone-acetaminophen (NORCO) 10-325 MG tablet, Take 1 tablet by mouth every 6 (six) hours as needed for moderate pain. , Disp: , Rfl:  .  metoprolol succinate (TOPROL-XL) 25 MG 24 hr tablet, TAKE 1 TABLET BY MOUTH EVERY DAY, Disp: 90 tablet, Rfl: 2 .  Misc Natural Products (OSTEO BI-FLEX JOINT SHIELD PO), Take 1 capsule by mouth daily., Disp: , Rfl:  .  Nintedanib (OFEV) 100 MG CAPS, Take 100 mg by mouth 2 (two) times daily., Disp: , Rfl:  .  nitroGLYCERIN (NITROSTAT) 0.4 MG SL tablet, Place 1 tablet (0.4 mg total) under the tongue every 5 (five) minutes x 3 doses as needed., Disp: 25 tablet, Rfl: 3 .  Omega-3 Fatty Acids (FISH OIL) 1200 MG CAPS, Take 1,200 mg by mouth daily. , Disp: , Rfl:  .  potassium chloride SA (K-DUR,KLOR-CON) 20 MEQ tablet, Take 1 tablet (20 mEq total) by mouth daily., Disp: 30 tablet, Rfl: 3 .  rivaroxaban (XARELTO) 2.5 MG TABS tablet, Take 1 tablet (2.5 mg total) by mouth 2 (two) times daily., Disp: 60 tablet, Rfl: 6 .  rosuvastatin (CRESTOR) 10 MG tablet, Take 1 tablet (10 mg total) by mouth every other day., Disp: , Rfl:  .  senna (SENOKOT) 8.6 MG tablet, Take 1 tablet by mouth daily., Disp: , Rfl:  .  spironolactone (ALDACTONE) 25 MG tablet, Take 1 tablet (25 mg total) by mouth daily., Disp: 15 tablet, Rfl: 6 .  sulfaSALAzine (AZULFIDINE) 500 MG tablet, Take 1 tablet (500 mg total) by mouth 2 (two) times daily., Disp: 60 tablet, Rfl: 6

## 2018-11-03 NOTE — Patient Instructions (Signed)
It was nice seeing today.  1. Please make sure you increase your lasix to 60 mg twice a day and increase your spironolactone as instructed by Dr. Aundra Dubin.  2. We added a liver function test to your blood work you will do in 10 days.  3. Continue with OFEV we will try to set you up with the [facility] specialty pharmacy.  4. Continue to use your oxygen and make sure you do your albuterol treatments as instructed.  5. We will put in a new order for you to have a new CPAP machine  6. We will schedule you for lung function test in June.

## 2018-11-03 NOTE — Progress Notes (Signed)
PCP: Dr. Scotty Court Cardiology: Dr. Domenic Polite HF Cardiology: Dr. Aundra Dubin  78 y.o. with history of prior DVT, CAD, HTN was referred by Dr. Domenic Polite for evaluation of pulmonary hypertension/RV failure.  Patient is a long-time smoker, quit in 3/19. In 1/19, she began to develop peripheral edema.  This gradually worsened.  She was given Lasix for 7 days with improvement, then edema returned and recently, Lasix was restarted at 40 mg daily.    In 3/19, I took her for right/left heart cath.  This showed 70% in-stent restenosis in the RCA, medically managed.  She had normal filling pressures on RHC with moderate pulmonary hypertension.  V/Q scan showed no chronic PE and PFTs showed moderate to severe obstructive defect.   She saw Dr. Lake Bells with pulmonary, and was diagnosed with both COPD and idiopathic pulmonary fibrosis.  She was started on Ofev.    She returns for followup of CHF and pulmonary hypertension.  She has home oxygen.  She has CPAP but needs to get it renewed by Dr. Lake Bells. Weight is up 8 lbs.  Her legs fatigue and hurt "all over" with walking.  No rest pain or pedal ulcers.  She uses a walker, mild dyspnea walking around her house.  More significant dyspnea with longer distances.  No orthopnea/PND.   Labs (1/19): K 3.8, creatinine 0.74 Labs (3/19): K 4.6, creatinine 0.87 Labs (5/19): LDL 52 Labs (7/19): K 4.3, creatinine 0.82 Labs (11/19): LDL 55, TGs 73 Labs (12/19): K 4.1, creatinine 0.87 => 0.99  PMH: 1. CAD: BMS to RCA in 2004.  - Cardiolite (12/16): EF 52%, fixed inferior defect with no ischemia.  - LHC (3/19): 70% in-stent restenosis in RCA stent, managed medically.  2. Depression 3. H/o DVT 4. HTN 5. Osteoarthritis: h/o R TKR.  6. H/o melanoma 7. Crohns disease 8. Hyperlipidemia 9. Carotid stenosis: Followed at [facility].  - Carotid dopplers (11/18): RICA 77-41% stenosis, LICA 28-78% stenosis.  - Carotid dopplers (67/67): RICA 20-94%, LICA 70-96%.  10. COPD: No longer smokes. -   PFTs (3/19): moderate-severe obstruction consistent with COPD. 11. Pulmonary hypertension/RV failure: Suspect primarily group 3.  Echo (1/19) with EF 55-60%, mild LVH, mild MR, moderate RV dilation with moderately decreased systolic function, moderate TR, PASP 74 mmHg.  - CTA chest (1/19): No PE, chronic fibrotic changes in the lungs, 6 mm RUL nodule.  - RHC (3/19): mean RA 2, PA 54/16 mean 28, mean PCWP 8, CI 2.55, PVR 3.9 WU.  - PFTs (3/19): moderate-severe obstruction consistent with COPD.  - V/Q scan (3/19): No evidence for chronic PE.  - Echo (3/20): EF 55-60%, moderately enlarged RV with moderately decreased RV systolic function, PASP 87 mmHg, moderate TR, dilated IVC.  12. TIA  13. PAD: Peripheral arterial dopplers (3/19) with totally occluded right AT, totally occluded left SFA, severe stenosis in left PT.  - ABIs (12/19): 0.73 on left, 1.05 on right.  14. Idiopathic pulmonary fibrosis: Followed by Dr. Lake Bells, she is on Ofev.  15. LBBB 16. Multinodular goiter 17. OSA  Social History   Socioeconomic History  . Marital status: Divorced    Spouse name: Not on file  . Number of children: 2  . Years of education: Not on file  . Highest education level: Not on file  Occupational History  . Occupation: Retired    Fish farm manager: RETIRED  Social Needs  . Financial resource strain: Not on file  . Food insecurity:    Worry: Not on file    Inability: Not  on file  . Transportation needs:    Medical: Not on file    Non-medical: Not on file  Tobacco Use  . Smoking status: Former Smoker    Packs/day: 1.00    Years: 40.00    Pack years: 40.00    Types: Cigarettes    Start date: 06/02/1957    Last attempt to quit: 09/2017    Years since quitting: 1.1  . Smokeless tobacco: Never Used  Substance and Sexual Activity  . Alcohol use: No    Alcohol/week: 0.0 standard drinks  . Drug use: No  . Sexual activity: Not on file  Lifestyle  . Physical activity:    Days per week: Not on file     Minutes per session: Not on file  . Stress: Not on file  Relationships  . Social connections:    Talks on phone: Not on file    Gets together: Not on file    Attends religious service: Not on file    Active member of club or organization: Not on file    Attends meetings of clubs or organizations: Not on file    Relationship status: Not on file  . Intimate partner violence:    Fear of current or ex partner: Not on file    Emotionally abused: Not on file    Physically abused: Not on file    Forced sexual activity: Not on file  Other Topics Concern  . Not on file  Social History Narrative  . Not on file   Family History  Problem Relation Age of Onset  . Diabetes type II Mother   . Heart attack Mother 73       questionable  . Uterine cancer Mother   . Heart disease Mother        before age 7  . Varicose Veins Mother   . Other Brother        PVD and hx DVT  . Deep vein thrombosis Brother   . Breast cancer Sister   . Hyperlipidemia Sister   . Hypertension Sister   . Colon cancer Neg Hx    ROS: All systems reviewed and negative except as per HPI.  Current Outpatient Medications  Medication Sig Dispense Refill  . albuterol (PROVENTIL) (2.5 MG/3ML) 0.083% nebulizer solution Take 3 mLs (2.5 mg total) by nebulization every 6 (six) hours. Dx COPD J44.9 75 mL 11  . antiseptic oral rinse (BIOTENE) LIQD 15 mLs by Mouth Rinse route as needed for dry mouth. 1 Bottle 2  . aspirin 81 MG tablet Take 81 mg by mouth daily.      . calcium citrate-vitamin D (CITRACAL+D) 315-200 MG-UNIT tablet Take 1 tablet by mouth 2 (two) times daily.    . Cyanocobalamin (B-12 PO) Take 1 tablet by mouth See admin instructions. Take 1 capsule every day x 7 days every other week     . ferrous sulfate 325 (65 FE) MG tablet Take 325 mg by mouth daily.    Marland Kitchen FLUoxetine (PROZAC) 40 MG capsule Take 40 mg by mouth daily.    . folic acid (FOLVITE) 1 MG tablet Take 1 tablet (1 mg total) by mouth daily. 90 tablet 4   . furosemide (LASIX) 40 MG tablet Take 1.5 tablets (60 mg total) by mouth 2 (two) times daily. (Patient taking differently: Take 40 mg by mouth 2 (two) times daily. Two tabs twice daily) 45 tablet 3  . gabapentin (NEURONTIN) 100 MG capsule Take two 162m tablets twice daily 120  capsule 6  . HYDROcodone-acetaminophen (NORCO) 10-325 MG tablet Take 1 tablet by mouth every 6 (six) hours as needed for moderate pain.     . metoprolol succinate (TOPROL-XL) 25 MG 24 hr tablet TAKE 1 TABLET BY MOUTH EVERY DAY 90 tablet 2  . Misc Natural Products (OSTEO BI-FLEX JOINT SHIELD PO) Take 1 capsule by mouth daily.    . Nintedanib (OFEV) 100 MG CAPS Take 100 mg by mouth 2 (two) times daily.    . nitroGLYCERIN (NITROSTAT) 0.4 MG SL tablet Place 1 tablet (0.4 mg total) under the tongue every 5 (five) minutes x 3 doses as needed. 25 tablet 3  . Omega-3 Fatty Acids (FISH OIL) 1200 MG CAPS Take 1,200 mg by mouth daily.     . potassium chloride SA (K-DUR,KLOR-CON) 20 MEQ tablet Take 1 tablet (20 mEq total) by mouth daily. 30 tablet 3  . rivaroxaban (XARELTO) 2.5 MG TABS tablet Take 1 tablet (2.5 mg total) by mouth 2 (two) times daily. 60 tablet 6  . rosuvastatin (CRESTOR) 10 MG tablet Take 1 tablet (10 mg total) by mouth every other day.    . senna (SENOKOT) 8.6 MG tablet Take 1 tablet by mouth daily.    Marland Kitchen spironolactone (ALDACTONE) 25 MG tablet Take 1 tablet (25 mg total) by mouth daily. 15 tablet 6  . sulfaSALAzine (AZULFIDINE) 500 MG tablet Take 1 tablet (500 mg total) by mouth 2 (two) times daily. 60 tablet 6   No current facility-administered medications for this encounter.    BP 132/80   Pulse (!) 50   Wt 99.1 kg (218 lb 6 oz)   SpO2 91% Comment: on 3l of O2  BMI 36.34 kg/m  General: NAD Neck: JVP 10 cm, no thyromegaly or thyroid nodule.  Lungs: Dry crackles at bases bilaterally.  CV: Nondisplaced PMI.  Heart regular S1/S2, no S3/S4, no murmur.  1+ edema to knees.  No carotid bruit.  Normal pedal pulses.   Abdomen: Soft, nontender, no hepatosplenomegaly, no distention.  Skin: Intact without lesions or rashes.  Neurologic: Alert and oriented x 3.  Psych: Normal affect. Extremities: No clubbing or cyanosis.  HEENT: Normal.   Assessment/Plan: 1. Chronic diastolic CHF with prominent RV failure: Moderately dilated RV with moderately decreased systolic function on 8/93 echo.   [place] in 3/19 showed normal right and left heart filling pressures.   Chronic NYHA class IIIb symptoms.  Suspect this is from a combination of CHF, COPD and IPF.  She looks volume overloaded on exam with weight up 8 lbs. - Increase Lasix to 60 mg bid.  Add KCl 20 daily. BMET today and again in 10 days.  - Increase spironolactone to 25 mg daily.   2. Pulmonary hypertension: PASP 74 mmHg on 1/19 echo with appearance of cor pulmonale.  CTA chest in 1/19 showed chronic fibrotic changes in the lungs but not emphysema, IPF diagnosed by Dr. Lake Bells.  However, PFTs in 3/19 were suggestive of moderate-severe obstruction consistent with COPD.  RHC (3/19) showed moderate pulmonary arterial hypertension.  V/Q scan showed no evidence for chronic pulmonary embolus.  My suspicion is that her pulmonary hypertension is primarily group 3 due to COPD and idiopathic pulmonary fibrosis.  She has OSA as well.  - I am not inclined to treat with pulmonary vasodilators, Dr Lake Bells has agreed with this.   - Use home oxygen.  - I encouraged CPAP use.  3. Pulmonary fibrosis: IPF.  She is now on Ofev.  She does not see  much of a difference. - She has quit smoking.  4. CAD: h/o BMS to RCA in 2004.  LHC in 3/19 showed 70% in-stent restenosis in the RCA.  With no chest pain, plan to treat medically.  - Continue ASA 81.  - She has extensive vascular disease.  Based on COMPASS study, I have her on rivaroxaban 2.5 mg bid.  - Continue Crestor, good lipids in 11/19.  5. Carotid stenosis: Followed by [facility]. 6. PAD: Peripheral arterial dopplers (3/19) showed occluded  left SFA.  She has leg pain but it is not clearly claudication.   - Saw Dr. Oneida Alar recently, continue conservative management.  - She has quit smoking.  7. COPD: She has now quit smoking.   Followup 3 wks with APP to reassess volume.   Loralie Champagne 11/03/2018

## 2018-11-03 NOTE — Progress Notes (Signed)
Subjective:   PATIENT ID: Shelley [name] GENDER: female DOB: 1941/07/14, MRN: [id number]   HPI  Patient is a 78 year old female with a past medical history significant for ILD, OSA, IPF, COPD, previous hypertension, diastolic heart failure who presents today to follow-up on recent episode of shortness of breath.  Patient reports that she has had worsening shortness of breath for the past 2 weeks.  She is also noticed increased lower extremity swelling as well as increased abdominal fluid.  Patient was seen this morning by heart failure (Dr. Aundra Dubin) and was told that she was 10 pounds from her dry weight of 207.  Patient has been using Lasix daily but reports minimal urine output.  Patient was also on spironolactone.  Patient reports that she has been doing well with the [place].  She has not been using her albuterol as instructed.  Patient reports that she has stopped using her CPAP machine after she was advised but insurance company decided back or pay a 900$ fee.  Patient reports that initially she was unable to use her CPAP machine due to intolerance with the mask.  Patient was later refitted and was using it on a nightly basis.  There was a 2-week lapse between when she initially stopped using it and when she was refitted.  Per daughter patient has not been compliant with albuterol treatment she has started only done once since last office visit.  Patient tries to be compliant with oxygen, she is on 3 L at home 3.5-4 L with exertion.  Patient denies any cough associated with shortness of breath, fever or sputum production.  Chief Complaint  Patient presents with  . Follow-up    remains SOB - no CPAP now     Past Medical History:  Diagnosis Date  . Anxiety   . Carotid artery disease ([place])    84-16% RICA and LICA 6/06 - Dr. Oneida Alar  . Cataract   . COPD (chronic obstructive pulmonary disease) ([place])   . Coronary atherosclerosis of native coronary artery    BMS to RCA 2004  . Depression     . DVT (deep venous thrombosis) ([employer])   . Essential hypertension, benign   . Fracture Left Great Toe  . Hepatic steatosis   . History of melanoma   . History of oral aphthous ulcers   . History of Salmonella gastroenteritis   . Hx of adenomatous colonic polyps   . Mixed hyperlipidemia   . Myocardial infarction ([place])    IMI 10/04  . Osteoarthritis   . Osteoporosis   . Pancreatitis   . Rectal bleeding   . Regional enteritis of large intestine ([place])   . Sinus polyp   . TIA (transient ischemic attack)   . Tubular adenoma of colon 08/2012     Family History  Problem Relation Age of Onset  . Diabetes type II Mother   . Heart attack Mother 57       questionable  . Uterine cancer Mother   . Heart disease Mother        before age 32  . Varicose Veins Mother   . Other Brother        PVD and hx DVT  . Deep vein thrombosis Brother   . Breast cancer Sister   . Hyperlipidemia Sister   . Hypertension Sister   . Colon cancer Neg Hx      Social History   Socioeconomic History  . Marital status: Divorced    Spouse name:  Not on file  . Number of children: 2  . Years of education: Not on file  . Highest education level: Not on file  Occupational History  . Occupation: Retired    Fish farm manager: RETIRED  Social Needs  . Financial resource strain: Not on file  . Food insecurity:    Worry: Not on file    Inability: Not on file  . Transportation needs:    Medical: Not on file    Non-medical: Not on file  Tobacco Use  . Smoking status: Former Smoker    Packs/day: 1.00    Years: 40.00    Pack years: 40.00    Types: Cigarettes    Start date: 06/02/1957    Last attempt to quit: 09/2017    Years since quitting: 1.1  . Smokeless tobacco: Never Used  Substance and Sexual Activity  . Alcohol use: No    Alcohol/week: 0.0 standard drinks  . Drug use: No  . Sexual activity: Not on file  Lifestyle  . Physical activity:    Days per week: Not on file    Minutes per session: Not on file   . Stress: Not on file  Relationships  . Social connections:    Talks on phone: Not on file    Gets together: Not on file    Attends religious service: Not on file    Active member of club or organization: Not on file    Attends meetings of clubs or organizations: Not on file    Relationship status: Not on file  . Intimate partner violence:    Fear of current or ex partner: Not on file    Emotionally abused: Not on file    Physically abused: Not on file    Forced sexual activity: Not on file  Other Topics Concern  . Not on file  Social History Narrative  . Not on file     Allergies  Allergen Reactions  . Doxycycline Other (See Comments)    Poss. Photosensitivity  . Mercaptopurine Other (See Comments)    REACTION: pancreatitis  . Simvastatin Other (See Comments)    Severe leg aches  . Sulfasalazine   . Penicillins Rash and Other (See Comments)    Has patient had a PCN reaction causing immediate rash, facial/tongue/throat swelling, SOB or lightheadedness with hypotension: No Has patient had a PCN reaction causing severe rash involving mucus membranes or skin necrosis: No Has patient had a PCN reaction that required hospitalization: No Has patient had a PCN reaction occurring within the last 10 years: No If all of the above answers are "NO", then may proceed with Cephalosporin use.   . Sulfa Antibiotics Rash     Outpatient Medications Prior to Visit  Medication Sig Dispense Refill  . albuterol (PROVENTIL) (2.5 MG/3ML) 0.083% nebulizer solution Take 3 mLs (2.5 mg total) by nebulization every 6 (six) hours. Dx COPD J44.9 75 mL 11  . antiseptic oral rinse (BIOTENE) LIQD 15 mLs by Mouth Rinse route as needed for dry mouth. 1 Bottle 2  . aspirin 81 MG tablet Take 81 mg by mouth daily.      . calcium citrate-vitamin D (CITRACAL+D) 315-200 MG-UNIT tablet Take 1 tablet by mouth 2 (two) times daily.    . Cyanocobalamin (B-12 PO) Take 1 tablet by mouth See admin instructions. Take 1  capsule every day x 7 days every other week     . ferrous sulfate 325 (65 FE) MG tablet Take 325 mg by mouth daily.    Marland Kitchen  FLUoxetine (PROZAC) 40 MG capsule Take 40 mg by mouth daily.    . folic acid (FOLVITE) 1 MG tablet Take 1 tablet (1 mg total) by mouth daily. 90 tablet 4  . furosemide (LASIX) 40 MG tablet Take 1.5 tablets (60 mg total) by mouth 2 (two) times daily. (Patient taking differently: Take 40 mg by mouth 2 (two) times daily. Two tabs twice daily) 45 tablet 3  . gabapentin (NEURONTIN) 100 MG capsule Take two 155m tablets twice daily 120 capsule 6  . HYDROcodone-acetaminophen (NORCO) 10-325 MG tablet Take 1 tablet by mouth every 6 (six) hours as needed for moderate pain.     . metoprolol succinate (TOPROL-XL) 25 MG 24 hr tablet TAKE 1 TABLET BY MOUTH EVERY DAY 90 tablet 2  . Misc Natural Products (OSTEO BI-FLEX JOINT SHIELD PO) Take 1 capsule by mouth daily.    . Nintedanib (OFEV) 100 MG CAPS Take 100 mg by mouth 2 (two) times daily.    . nitroGLYCERIN (NITROSTAT) 0.4 MG SL tablet Place 1 tablet (0.4 mg total) under the tongue every 5 (five) minutes x 3 doses as needed. 25 tablet 3  . Omega-3 Fatty Acids (FISH OIL) 1200 MG CAPS Take 1,200 mg by mouth daily.     . potassium chloride SA (K-DUR,KLOR-CON) 20 MEQ tablet Take 1 tablet (20 mEq total) by mouth daily. 30 tablet 3  . rivaroxaban (XARELTO) 2.5 MG TABS tablet Take 1 tablet (2.5 mg total) by mouth 2 (two) times daily. 60 tablet 6  . rosuvastatin (CRESTOR) 10 MG tablet Take 1 tablet (10 mg total) by mouth every other day.    . senna (SENOKOT) 8.6 MG tablet Take 1 tablet by mouth daily.    .Marland Kitchenspironolactone (ALDACTONE) 25 MG tablet Take 1 tablet (25 mg total) by mouth daily. 15 tablet 6  . sulfaSALAzine (AZULFIDINE) 500 MG tablet Take 1 tablet (500 mg total) by mouth 2 (two) times daily. 60 tablet 6  . dicyclomine (BENTYL) 10 MG capsule Take 1 capsule (10 mg total) by mouth as needed for spasms. 30 capsule 3  . Fexofenadine HCl  (ALLEGRA ALLERGY PO) Take 1 tablet by mouth daily as needed (for allergies).      No facility-administered medications prior to visit.     Review of Systems  Eyes: Negative.   Respiratory: Positive for shortness of breath. Negative for cough.   Cardiovascular: Positive for leg swelling.  Genitourinary: Negative.   Musculoskeletal: Negative.       Objective:  Physical Exam Constitutional:      Appearance: Normal appearance. She is obese.  HENT:     Head: Normocephalic and atraumatic.     Nose: Nose normal.     Mouth/Throat:     Mouth: Mucous membranes are moist.     Pharynx: Oropharynx is clear.  Eyes:     Extraocular Movements: Extraocular movements intact.     Pupils: Pupils are equal, round, and reactive to light.  Neck:     Musculoskeletal: Normal range of motion.  Cardiovascular:     Rate and Rhythm: Normal rate and regular rhythm.     Pulses: Normal pulses.  Pulmonary:     Effort: Pulmonary effort is normal.     Breath sounds: Normal breath sounds.  Abdominal:     General: Abdomen is flat. Bowel sounds are normal.  Musculoskeletal: Normal range of motion.  Skin:    General: Skin is warm and dry.     Capillary Refill: Capillary refill takes less than  2 seconds.  Neurological:     Mental Status: She is alert.    Vitals:   11/03/18 1412  BP: (!) 110/58  Pulse: 66  SpO2: 90%  Weight: 214 lb 9.6 oz (97.3 kg)  Height: _0  (1.651 m)    CBC    Component Value Date/Time   WBC 4.7 08/13/2018 1532   RBC 3.95 08/13/2018 1532   HGB 12.9 08/13/2018 1532   HCT 41.8 08/13/2018 1532   PLT 184 08/13/2018 1532   MCV 105.8 (H) 08/13/2018 1532   MCH 32.7 08/13/2018 1532   MCHC 30.9 08/13/2018 1532   RDW 13.5 08/13/2018 1532   LYMPHSABS 1.4 07/27/2018 1609   MONOABS 0.5 07/27/2018 1609   EOSABS 0.1 07/27/2018 1609   BASOSABS 0.0 07/27/2018 1609     Chest imaging:  PFT:  Labs:  Path:  Echo: EF 31-49% with diastolic dysfunction, Severe pulmonary  hypertension. Compared with the echo 09/24/17, PASP has increased from 74 mmHg to 87 mmHg.  Heart Catheterization:       Assessment & Plan:   No diagnosis found.  Discussion: Patient is a 78 year old female with a complex past medical history who presents today to follow-up on multiple chronic issues:  1.  Interstitial pulmonary fibrosis Patient seems to be doing well on OFEV.  She is currently getting medication through third-party however decision has been started to possibly use cold specialty pharmacy.  Patient will have CMP added to her labs in 10 days.  Patient is also scheduled for PFTs in June 2020.  2.  Pulmonary hypertension/diastolic heart failure Patient was seen today by Dr. Algernon Huxley and had an echo done which showed an EF of 50- 70% w/diastolic dysfunction severe pulmonary hypertension.  Lasix was increased from 60 daily to twice daily.  Spironolactone dose was also increased.  Patient has significant lower extremity edema 2+ pitting and crackles on lung exam.  She will continue to weigh herself daily and continue with a low-salt diet.  3.  OSA Patient has been unable to use CPAP machine since end of January.  Patient reports that she initially was sent back to company patient was thought to be noncompliant.  Upon further discussion with patient and daughter it appears that patient was initially not tolerating CPAP mask and had to be refitted.  During that time patient was unable to use the machine.  After receiving new mask patient was able to use CPAP machine on a nightly basis. It appears that DME/insurance company interpreted lapse during refitting as non compliance. Patient will likely need a new prescription for CPAP machine.  Will not likely need new sleep study given the settings will be unchanged.  4.  COPD Patient has been noncompliant with her albuterol treatment.  Patient is supposed to be on neb every 6h daily but has only done one treatment since last office visit.  Discussed at length to be adherent to breathing treatment to prevent progression of her COPD.  We will continue to monitor.  5.  Chronic respiratory failure Patient continued to be on 3 L oxygen at home and seems to be doing well on it.  She had worsening shortness of breath in the setting of volume overload.  Patient will continue oxygen therapy.    Current Outpatient Medications:  .  albuterol (PROVENTIL) (2.5 MG/3ML) 0.083% nebulizer solution, Take 3 mLs (2.5 mg total) by nebulization every 6 (six) hours. Dx COPD J44.9, Disp: 75 mL, Rfl: 11 .  antiseptic oral rinse (BIOTENE) LIQD,  15 mLs by Mouth Rinse route as needed for dry mouth., Disp: 1 Bottle, Rfl: 2 .  aspirin 81 MG tablet, Take 81 mg by mouth daily.  , Disp: , Rfl:  .  calcium citrate-vitamin D (CITRACAL+D) 315-200 MG-UNIT tablet, Take 1 tablet by mouth 2 (two) times daily., Disp: , Rfl:  .  Cyanocobalamin (B-12 PO), Take 1 tablet by mouth See admin instructions. Take 1 capsule every day x 7 days every other week , Disp: , Rfl:  .  ferrous sulfate 325 (65 FE) MG tablet, Take 325 mg by mouth daily., Disp: , Rfl:  .  FLUoxetine (PROZAC) 40 MG capsule, Take 40 mg by mouth daily., Disp: , Rfl:  .  folic acid (FOLVITE) 1 MG tablet, Take 1 tablet (1 mg total) by mouth daily., Disp: 90 tablet, Rfl: 4 .  furosemide (LASIX) 40 MG tablet, Take 1.5 tablets (60 mg total) by mouth 2 (two) times daily. (Patient taking differently: Take 40 mg by mouth 2 (two) times daily. Two tabs twice daily), Disp: 45 tablet, Rfl: 3 .  gabapentin (NEURONTIN) 100 MG capsule, Take two 121m tablets twice daily, Disp: 120 capsule, Rfl: 6 .  HYDROcodone-acetaminophen (NORCO) 10-325 MG tablet, Take 1 tablet by mouth every 6 (six) hours as needed for moderate pain. , Disp: , Rfl:  .  metoprolol succinate (TOPROL-XL) 25 MG 24 hr tablet, TAKE 1 TABLET BY MOUTH EVERY DAY, Disp: 90 tablet, Rfl: 2 .  Misc Natural Products (OSTEO BI-FLEX JOINT SHIELD PO), Take 1 capsule by  mouth daily., Disp: , Rfl:  .  Nintedanib (OFEV) 100 MG CAPS, Take 100 mg by mouth 2 (two) times daily., Disp: , Rfl:  .  nitroGLYCERIN (NITROSTAT) 0.4 MG SL tablet, Place 1 tablet (0.4 mg total) under the tongue every 5 (five) minutes x 3 doses as needed., Disp: 25 tablet, Rfl: 3 .  Omega-3 Fatty Acids (FISH OIL) 1200 MG CAPS, Take 1,200 mg by mouth daily. , Disp: , Rfl:  .  potassium chloride SA (K-DUR,KLOR-CON) 20 MEQ tablet, Take 1 tablet (20 mEq total) by mouth daily., Disp: 30 tablet, Rfl: 3 .  rivaroxaban (XARELTO) 2.5 MG TABS tablet, Take 1 tablet (2.5 mg total) by mouth 2 (two) times daily., Disp: 60 tablet, Rfl: 6 .  rosuvastatin (CRESTOR) 10 MG tablet, Take 1 tablet (10 mg total) by mouth every other day., Disp: , Rfl:  .  senna (SENOKOT) 8.6 MG tablet, Take 1 tablet by mouth daily., Disp: , Rfl:  .  spironolactone (ALDACTONE) 25 MG tablet, Take 1 tablet (25 mg total) by mouth daily., Disp: 15 tablet, Rfl: 6 .  sulfaSALAzine (AZULFIDINE) 500 MG tablet, Take 1 tablet (500 mg total) by mouth 2 (two) times daily., Disp: 60 tablet, Rfl: 6

## 2018-11-03 NOTE — Patient Instructions (Signed)
Labs were done today. We will call you with any ABNORMAL results. No news is good news!  Please follow up lab work at [facility] for repeat lab work in 10 days, this order has been put in for you.  INCREASE Spirolactone to 36m daily.  INCREASE Lasix to 664mTWICE A DAY.  Take Potassium 20 mEq daily.  These prescriptions have been sent to your pharmacy.  Your physician recommends that you schedule a follow-up appointment in: 3 Weeks.

## 2018-11-06 ENCOUNTER — Telehealth: Payer: Self-pay | Admitting: Pulmonary Disease

## 2018-11-06 NOTE — Telephone Encounter (Signed)
Spoke with the pt  She was not aware that she needed to speak with [facility]  She is available today at her home number and states will answer their call  Spoke with Maudie Mercury at [facility] and they are going to reopen the case  Nothing further needed

## 2018-11-13 ENCOUNTER — Other Ambulatory Visit (HOSPITAL_COMMUNITY)
Admission: RE | Admit: 2018-11-13 | Discharge: 2018-11-13 | Disposition: A | Discharge status: Home / Self Care | Payer: PPO | Source: Ambulatory Visit | Attending: Cardiology | Admitting: Cardiology

## 2018-11-13 ENCOUNTER — Other Ambulatory Visit: Payer: Self-pay

## 2018-11-13 ENCOUNTER — Other Ambulatory Visit (HOSPITAL_COMMUNITY)
Admission: RE | Admit: 2018-11-13 | Discharge: 2018-11-13 | Disposition: A | Discharge status: Home / Self Care | Payer: PPO | Source: Ambulatory Visit | Attending: Pulmonary Disease | Admitting: Pulmonary Disease

## 2018-11-13 DIAGNOSIS — J84112 Idiopathic pulmonary fibrosis: Secondary | ICD-10-CM | POA: Insufficient documentation

## 2018-11-13 LAB — HEPATIC FUNCTION PANEL
ALT: 20 U/L (ref 0–44)
AST: 21 U/L (ref 15–41)
Albumin: 3.9 g/dL (ref 3.5–5.0)
Alkaline Phosphatase: 69 U/L (ref 38–126)
BILIRUBIN DIRECT: 0.3 mg/dL — AB (ref 0.0–0.2)
Indirect Bilirubin: 1 mg/dL — ABNORMAL HIGH (ref 0.3–0.9)
Total Bilirubin: 1.3 mg/dL — ABNORMAL HIGH (ref 0.3–1.2)
Total Protein: 4.7 g/dL — ABNORMAL LOW (ref 6.5–8.1)

## 2018-11-13 LAB — BASIC METABOLIC PANEL
Anion gap: 10 (ref 5–15)
BUN: 18 mg/dL (ref 8–23)
CALCIUM: 9 mg/dL (ref 8.9–10.3)
CO2: 27 mmol/L (ref 22–32)
Chloride: 100 mmol/L (ref 98–111)
Creatinine, Ser: 0.97 mg/dL (ref 0.44–1.00)
GFR calc Af Amer: >60 mL/min (ref >60)
GFR calc non Af Amer: 56 mL/min — ABNORMAL LOW (ref >60)
Glucose, Bld: 86 mg/dL (ref 70–99)
Potassium: 4 mmol/L (ref 3.5–5.1)
SODIUM: 137 mmol/L (ref 135–145)

## 2018-11-17 DIAGNOSIS — I5032 Chronic diastolic (congestive) heart failure: Secondary | ICD-10-CM | POA: Diagnosis not present

## 2018-11-17 DIAGNOSIS — R062 Wheezing: Secondary | ICD-10-CM | POA: Diagnosis not present

## 2018-11-17 DIAGNOSIS — R2689 Other abnormalities of gait and mobility: Secondary | ICD-10-CM | POA: Diagnosis not present

## 2018-11-24 ENCOUNTER — Ambulatory Visit (HOSPITAL_COMMUNITY)
Admission: RE | Admit: 2018-11-24 | Discharge: 2018-11-24 | Disposition: A | Discharge status: Home / Self Care | Payer: PPO | Source: Ambulatory Visit | Attending: Cardiology | Admitting: Cardiology

## 2018-11-24 ENCOUNTER — Telehealth: Payer: Self-pay | Admitting: Pulmonary Disease

## 2018-11-24 ENCOUNTER — Other Ambulatory Visit: Payer: Self-pay

## 2018-11-24 DIAGNOSIS — I25119 Atherosclerotic heart disease of native coronary artery with unspecified angina pectoris: Secondary | ICD-10-CM

## 2018-11-24 DIAGNOSIS — I2721 Secondary pulmonary arterial hypertension: Secondary | ICD-10-CM | POA: Diagnosis not present

## 2018-11-24 DIAGNOSIS — G4733 Obstructive sleep apnea (adult) (pediatric): Secondary | ICD-10-CM | POA: Diagnosis not present

## 2018-11-24 DIAGNOSIS — J84112 Idiopathic pulmonary fibrosis: Secondary | ICD-10-CM

## 2018-11-24 DIAGNOSIS — I5032 Chronic diastolic (congestive) heart failure: Secondary | ICD-10-CM | POA: Diagnosis not present

## 2018-11-24 MED ORDER — FUROSEMIDE 40 MG PO TABS: ORAL_TABLET | ORAL | 3 refills | Status: DC

## 2018-11-24 NOTE — Telephone Encounter (Signed)
I sent an urgent message to the ladies at [facility] stating the patient has called in about not getting her CPAP Machine yet. Waiting on [employer]'s response

## 2018-11-24 NOTE — Progress Notes (Signed)
Spoke to Dr. Anastasia Pall office about getting this patient a CPAP. She was going to send a message to the provider about this. Office to call us back.

## 2018-11-24 NOTE — Telephone Encounter (Signed)
I spoke with [place] with [employer] and she stated that the patient had an appt at 1:00pm today for CPAP setup class and she was seeing Dominica Severin at the [facility] location.

## 2018-11-24 NOTE — Addendum Note (Signed)
Encounter addended by: Georgiana Shore, NP on: 11/24/2018 10:23 AM  Actions taken: Clinical Note Signed

## 2018-11-24 NOTE — Addendum Note (Signed)
Encounter addended by: Marlise Eves, RN on: 11/24/2018 10:47 AM  Actions taken: Order list changed, Clinical Note Signed

## 2018-11-24 NOTE — Telephone Encounter (Signed)
Called Dr. Claris Gladden office. Marye Round is working remotely from home. She stated that the patient had a televisit with the NP today and she stated she still has not received her CPAP.   [facility] can you help with this. Order placed 3/10

## 2018-11-24 NOTE — Progress Notes (Addendum)
Heart Failure TeleHealth Note  Due to national recommendations of social distancing due to [place] 19, telehealth visit is felt to be most appropriate for this patient at this time.  I discussed the limitations, risks, security and privacy concerns of performing an evaluation and management service by telephone and the availability of in person appointments. I also discussed with the patient that there may be a patient responsible charge related to this service. The patient expressed understanding and agreed to proceed.   ID:  Shelley [name], DOB 19-Sep-1940, MRN [id number]  Location: Home  Provider location: [address] [place] Type of Visit: Established patient, etc  PCP:  Sandi Mealy, MD  Cardiologist:  Rozann Lesches, MD Primary HF: Dr Aundra Dubin  Chief Complaint: Chronic diastolic HF/RV failure   History of Present Illness: Shelley [name] is a 78 y.o. female with a history of chronic diastolic HF with prominent RV failure, PAH, pulmonary fibrosis, OSA, CAD s/p BMS to RCA 2004, carotid stenosis, COPD, former smoker, and PAD.   She was last seen in clinic 11/03/18. Her lasix and sprio were increased due to volume overload. Her repeat labs were stable with creatinine 0.97 and K 4.0 on 3/20.   She presents via Psychiatric nurse for a telehealth visit today. Overall doing much better. SOB is at baseline. Activity is limited due to chronic leg pain. She sees Dr Oneida Alar again in May for PAD. She is SOB moving around the house, but seems to be better if she is wearing oxygen. She wears O2 PRN, but does not wear at night. She does not currently have CPAP machine, but Dr Lake Bells is working on getting her a machine. She was supposed to pick up a machine today, but is no longer coming to [place]. No bleeding, but bruises easily. BLE edema is much improved. No orthopnea or PND. No CP or dizziness. She has a rare cough. No fever or chills. No sick contacts. Main complaint is  urinating throughout the night. She takes PM dose of lasix around 2 pm. Drinking fluid whenever she is thirsty, but doesn't keep track of how much. Taking all medications. Does not need refills that she is aware of.   Weight: 199 lbs today (down from 218 lbs on our scale 3/10)  Pt denies symptoms of cough, fevers, chills, or new SOB worrisome for COVID 19.  No sick contacts.   Past Medical History:  Diagnosis Date  . Anxiety   . Carotid artery disease ([place])    51-70% RICA and LICA 0/17 - Dr. Oneida Alar  . Cataract   . COPD (chronic obstructive pulmonary disease) ([place])   . Coronary atherosclerosis of native coronary artery    BMS to RCA 2004  . Depression   . DVT (deep venous thrombosis) ([place])   . Essential hypertension, benign   . Fracture Left Great Toe  . Hepatic steatosis   . History of melanoma   . History of oral aphthous ulcers   . History of Salmonella gastroenteritis   . Hx of adenomatous colonic polyps   . Mixed hyperlipidemia   . Myocardial infarction ([place])    IMI 10/04  . Osteoarthritis   . Osteoporosis   . Pancreatitis   . Rectal bleeding   . Regional enteritis of large intestine ([place])   . Sinus polyp   . TIA (transient ischemic attack)   . Tubular adenoma of colon 08/2012   Past Surgical History:  Procedure Laterality Date  .  CATARACT EXTRACTION Bilateral   . Melanoma resection  1996  . RIGHT/LEFT HEART CATH AND CORONARY ANGIOGRAPHY N/A 10/30/2017   Procedure: RIGHT/LEFT HEART CATH AND CORONARY ANGIOGRAPHY;  Surgeon: Larey Dresser, MD;  Location: [place] [facility];  Service: Cardiovascular;  Laterality: N/A;  . TOTAL KNEE ARTHROPLASTY Right Feb 2012     Current Outpatient Medications  Medication Sig Dispense Refill  . aspirin 81 MG tablet Take 81 mg by mouth daily.      . furosemide (LASIX) 40 MG tablet Take 1.5 tablets (60 mg total) by mouth 2 (two) times daily. (Patient taking differently: Take 40 mg by mouth 2 (two) times daily. Two tabs twice daily) 45  tablet 3  . metoprolol succinate (TOPROL-XL) 25 MG 24 hr tablet TAKE 1 TABLET BY MOUTH EVERY DAY 90 tablet 2  . potassium chloride SA (K-DUR,KLOR-CON) 20 MEQ tablet Take 1 tablet (20 mEq total) by mouth daily. 30 tablet 3  . rivaroxaban (XARELTO) 2.5 MG TABS tablet Take 1 tablet (2.5 mg total) by mouth 2 (two) times daily. 60 tablet 6  . rosuvastatin (CRESTOR) 10 MG tablet Take 1 tablet (10 mg total) by mouth every other day.    . spironolactone (ALDACTONE) 25 MG tablet Take 1 tablet (25 mg total) by mouth daily. 15 tablet 6  . albuterol (PROVENTIL) (2.5 MG/3ML) 0.083% nebulizer solution Take 3 mLs (2.5 mg total) by nebulization every 6 (six) hours. Dx COPD J44.9 75 mL 11  . antiseptic oral rinse (BIOTENE) LIQD 15 mLs by Mouth Rinse route as needed for dry mouth. 1 Bottle 2  . calcium citrate-vitamin D (CITRACAL+D) 315-200 MG-UNIT tablet Take 1 tablet by mouth 2 (two) times daily.    . Cyanocobalamin (B-12 PO) Take 1 tablet by mouth See admin instructions. Take 1 capsule every day x 7 days every other week     . ferrous sulfate 325 (65 FE) MG tablet Take 325 mg by mouth daily.    Marland Kitchen FLUoxetine (PROZAC) 40 MG capsule Take 40 mg by mouth daily.    . folic acid (FOLVITE) 1 MG tablet Take 1 tablet (1 mg total) by mouth daily. 90 tablet 4  . gabapentin (NEURONTIN) 100 MG capsule Take two 13m tablets twice daily 120 capsule 6  . HYDROcodone-acetaminophen (NORCO) 10-325 MG tablet Take 1 tablet by mouth every 6 (six) hours as needed for moderate pain.     . Misc Natural Products (OSTEO BI-FLEX JOINT SHIELD PO) Take 1 capsule by mouth daily.    . Nintedanib (OFEV) 100 MG CAPS Take 100 mg by mouth 2 (two) times daily.    . nitroGLYCERIN (NITROSTAT) 0.4 MG SL tablet Place 1 tablet (0.4 mg total) under the tongue every 5 (five) minutes x 3 doses as needed. 25 tablet 3  . Omega-3 Fatty Acids (FISH OIL) 1200 MG CAPS Take 1,200 mg by mouth daily.     .Marland Kitchensenna (SENOKOT) 8.6 MG tablet Take 1 tablet by mouth daily.     .Marland KitchensulfaSALAzine (AZULFIDINE) 500 MG tablet Take 1 tablet (500 mg total) by mouth 2 (two) times daily. 60 tablet 6   No current facility-administered medications for this encounter.     Allergies:   Doxycycline; Mercaptopurine; Simvastatin; Sulfasalazine; Penicillins; and Sulfa antibiotics   Social History:  The patient  reports that she quit smoking about 13 months ago. Her smoking use included cigarettes. She started smoking about 61 years ago. She has a 40.00 pack-year smoking history. She has never used smokeless tobacco.  She reports that she does not drink alcohol or use drugs.   Family History:  The patient's family history includes Breast cancer in her sister; Deep vein thrombosis in her brother; Diabetes type II in her mother; Heart attack (age of onset: 14) in her mother; Heart disease in her mother; Hyperlipidemia in her sister; Hypertension in her sister; Other in her brother; Uterine cancer in her mother; Varicose Veins in her mother.   ROS:  Please see the history of present illness.   All other systems are personally reviewed and negative.   Exam:  [facility]) Lungs: Normal respiratory effort with conversation.  Neuro: Alert & oriented x 3.   Recent Labs: 12/12/2017: TSH 1.30 08/13/2018: Hemoglobin 12.9; Platelets 184 11/13/2018: ALT 20; BUN 18; Creatinine, Ser 0.97; Potassium 4.0; Sodium 137  Personally reviewed   Wt Readings from Last 3 Encounters:  11/03/18 99.1 kg (218 lb 6 oz)  11/03/18 97.3 kg (214 lb 9.6 oz)  09/21/18 97.9 kg (215 lb 12.8 oz)      ASSESSMENT AND PLAN:  1.  Chronic diastolic HF with prominent RV failure - Echo 10/2018: mod dilated and mod reduced RV, EF 55-60%, mod TR - NYHA IIIb. Suspect combination of CHF, COPD, and IPF. Volume sounds stable. Weight is down and recent labs were stable. - Change lasix to 80 mg in am, 40 mg in pm to see if this helps with nighttime urination. She takes afternoon dose around 2 pm. I also told her to limit  fluid intake prior to bedtime.  - Continue K 20 meq daily - Continue spiro 25 mg daily - Discussed limiting fluid intake to less than 2 L daily.   2. PAH - Moderate on RHC 10/2017. Thought to be primarily group 3 due to COPD, OSA, and idiopathic pulmonary fibrorsis - Follows with Dr Lake Bells. Both Dr Lake Bells and Dr Aundra Dubin agree not to treat with pulmonary vasodilators - Continue home oxygen. She wears PRN  3. Idiopathic pulmonary fibrosis - On ofev. Follows with Dr Lake Bells  4. CAD - Continue ASA 81 mg daily - Continue rivaroxaban 2.5 mg BID (based on COMPASS study). No bleeding.  - Continue crestor. - No CP.   5. COPD - No longer smoking >1 year.  6. OSA - She does not currently have a CPAP machine and was supposed to pick one up at Dr Anastasia Pall office today, but had called to let them know she would not be in [place]. We will follow up with Dr Anastasia Pall office today.    7. Goals of Care - She has discussed her wishes with her two daughters, Claiborne Billings and [name]. They would be responsible for making decisions for her if she were unable to make decisions for herself.  COVID screen The patient does not have any symptoms that suggest any further testing/ screening at this time.  Social distancing reinforced today. I made her aware of [facility] free E-visits for COVID screening.    Food insecurity screen Daughter is getting groceries for her.   Patient Risk: After full review of this patients clinical status, I feel that they are at moderate risk for cardiac decompensation at this time.  Follow up: 2-3 months with Dr Aundra Dubin, sooner if symptoms worsen.  Today, I have spent 19 minutes with the patient with telehealth technology discussing heart failure symptoms, fluid restriction, medications, and reviewing labs.    Signed, Georgiana Shore, NP  11/24/2018 9:46 AM  [facility] [place] [id number] [phone] (office)  [phone] (fax)

## 2018-11-24 NOTE — Patient Instructions (Signed)
CHANGE Furosemide 17m (2 tabs) every morning and 46m(1 tab) every evening.  Please follow up with Dr. McAundra Dubinn 2-3 months

## 2018-11-24 NOTE — Addendum Note (Signed)
Encounter addended by: Marlise Eves, RN on: 11/24/2018 10:56 AM  Actions taken: Clinical Note Signed

## 2018-11-25 ENCOUNTER — Ambulatory Visit: Payer: PPO | Admitting: Cardiology

## 2018-11-25 NOTE — Telephone Encounter (Signed)
Called [place] and left message with her office since she is working remotely and let them know she had a set up appointment on 11/23/17.Nothing further is needed at this time.

## 2018-11-28 DIAGNOSIS — I5032 Chronic diastolic (congestive) heart failure: Secondary | ICD-10-CM | POA: Diagnosis not present

## 2018-11-28 DIAGNOSIS — R062 Wheezing: Secondary | ICD-10-CM | POA: Diagnosis not present

## 2018-11-28 DIAGNOSIS — R2689 Other abnormalities of gait and mobility: Secondary | ICD-10-CM | POA: Diagnosis not present

## 2018-11-28 DIAGNOSIS — J449 Chronic obstructive pulmonary disease, unspecified: Secondary | ICD-10-CM | POA: Diagnosis not present

## 2018-12-01 ENCOUNTER — Telehealth: Payer: Self-pay | Admitting: Pulmonary Disease

## 2018-12-01 NOTE — Telephone Encounter (Signed)
Contacted patient by phone regarding her inquiry on OFEV funding.  No updates on our end but patient given [facility] phone number to check on status [phone].  Patient acknowledged her understanding and will call for update. Nothing further needed.

## 2018-12-04 ENCOUNTER — Other Ambulatory Visit (HOSPITAL_COMMUNITY): Payer: Self-pay | Admitting: Cardiology

## 2018-12-04 MED ORDER — SPIRONOLACTONE 25 MG PO TABS: 25.0000 mg | ORAL_TABLET | Freq: Every day | ORAL | 3 refills | Status: DC

## 2018-12-04 NOTE — Telephone Encounter (Signed)
Patient called to request refill and change quantity to match increased dose.  Script sent

## 2018-12-18 DIAGNOSIS — R062 Wheezing: Secondary | ICD-10-CM | POA: Diagnosis not present

## 2018-12-18 DIAGNOSIS — I5032 Chronic diastolic (congestive) heart failure: Secondary | ICD-10-CM | POA: Diagnosis not present

## 2018-12-18 DIAGNOSIS — R2689 Other abnormalities of gait and mobility: Secondary | ICD-10-CM | POA: Diagnosis not present

## 2018-12-28 DIAGNOSIS — R2689 Other abnormalities of gait and mobility: Secondary | ICD-10-CM | POA: Diagnosis not present

## 2018-12-28 DIAGNOSIS — R062 Wheezing: Secondary | ICD-10-CM | POA: Diagnosis not present

## 2018-12-28 DIAGNOSIS — I5032 Chronic diastolic (congestive) heart failure: Secondary | ICD-10-CM | POA: Diagnosis not present

## 2018-12-28 DIAGNOSIS — J449 Chronic obstructive pulmonary disease, unspecified: Secondary | ICD-10-CM | POA: Diagnosis not present

## 2018-12-30 ENCOUNTER — Telehealth: Payer: Self-pay | Admitting: Pulmonary Disease

## 2018-12-30 NOTE — Telephone Encounter (Signed)
Noted. Sending to Dr. Lake Bells as an Juluis Rainier

## 2018-12-31 NOTE — Telephone Encounter (Signed)
noted 

## 2019-01-17 DIAGNOSIS — R2689 Other abnormalities of gait and mobility: Secondary | ICD-10-CM | POA: Diagnosis not present

## 2019-01-17 DIAGNOSIS — I5032 Chronic diastolic (congestive) heart failure: Secondary | ICD-10-CM | POA: Diagnosis not present

## 2019-01-17 DIAGNOSIS — R062 Wheezing: Secondary | ICD-10-CM | POA: Diagnosis not present

## 2019-01-20 ENCOUNTER — Other Ambulatory Visit: Payer: Self-pay

## 2019-01-20 MED ORDER — NINTEDANIB ESYLATE 100 MG PO CAPS: 100.0000 mg | ORAL_CAPSULE | Freq: Two times a day (BID) | ORAL | 11 refills | Status: DC

## 2019-01-24 NOTE — Progress Notes (Signed)
Virtual Visit via Telephone Note   This visit type was conducted due to national recommendations for restrictions regarding the COVID-19 Pandemic (e.g. social distancing) in an effort to limit this patient's exposure and mitigate transmission in our community.  Due to her co-morbid illnesses, this patient is at least at moderate risk for complications without adequate follow up.  This format is felt to be most appropriate for this patient at this time.  The patient did not have access to video technology/had technical difficulties with video requiring transitioning to audio format only (telephone).  All issues noted in this document were discussed and addressed.  No physical exam could be performed with this format.  Please refer to the patient's chart for her  consent to telehealth for [facility].   Date:  01/25/2019   ID:  Shelley [name], DOB [birth year], MRN [id number]  Patient Location: Home Provider Location: Office  PCP:  Sandi Mealy, MD  Cardiologist:  Rozann Lesches, MD Electrophysiologist:  None   Evaluation Performed:  Follow-Up Visit  Chief Complaint:  Cardiac follow-up  History of Present Illness:    Shelley [name] is a 78 y.o. female that I last saw in September 2019. She has had interval follow-up in the Heart Failure clinic.  She did not have video access and we spoke by phone today.  She reports no major change in dyspnea on exertion or stamina, continues to use supplemental oxygen.  She has not been out of her house very much at all during the pandemic.  Lasix was changed to 80 mg in the a.m. and 40 mg in the p.m. back in March.  She reports last nocturnal urination.  Actually, her weight has also decreased down to 205 pounds from 218 pounds.  Follow-up echocardiogram in March is outlined below.  We went over her medications and discussed follow-up BMET.  She has follow-up with Dr. Aundra Dubin later this month.  The patient does not have symptoms concerning  for COVID-19 infection (fever, chills, cough, or new shortness of breath).    Past Medical History:  Diagnosis Date  . Anxiety   . Carotid artery disease ([place])    11-03% RICA and LICA 1/59 - Dr. Oneida Alar  . Cataract   . COPD (chronic obstructive pulmonary disease) ([place])   . Coronary atherosclerosis of native coronary artery    BMS to RCA 2004  . Depression   . DVT (deep venous thrombosis) ([place])   . Essential hypertension, benign   . Fracture Left Great Toe  . Hepatic steatosis   . History of melanoma   . History of oral aphthous ulcers   . History of Salmonella gastroenteritis   . Hx of adenomatous colonic polyps   . Mixed hyperlipidemia   . Myocardial infarction ([place])    IMI 10/04  . Osteoarthritis   . Osteoporosis   . Pancreatitis   . Rectal bleeding   . Regional enteritis of large intestine ([place])   . Sinus polyp   . TIA (transient ischemic attack)   . Tubular adenoma of colon 08/2012   Past Surgical History:  Procedure Laterality Date  . CATARACT EXTRACTION Bilateral   . Melanoma resection  1996  . RIGHT/LEFT HEART CATH AND CORONARY ANGIOGRAPHY N/A 10/30/2017   Procedure: RIGHT/LEFT HEART CATH AND CORONARY ANGIOGRAPHY;  Surgeon: Larey Dresser, MD;  Location: [place] [facility];  Service: Cardiovascular;  Laterality: N/A;  . TOTAL KNEE ARTHROPLASTY Right Feb 2012     Current Meds  Medication Sig  . albuterol (PROVENTIL) (2.5 MG/3ML) 0.083% nebulizer solution Take 3 mLs (2.5 mg total) by nebulization every 6 (six) hours. Dx COPD J44.9  . antiseptic oral rinse (BIOTENE) LIQD 15 mLs by Mouth Rinse route as needed for dry mouth.  Marland Kitchen aspirin 81 MG tablet Take 81 mg by mouth daily.    . calcium citrate-vitamin D (CITRACAL+D) 315-200 MG-UNIT tablet Take 1 tablet by mouth daily.   . Cyanocobalamin (B-12 PO) Take 1 tablet by mouth See admin instructions. Take 1 capsule every day x 7 days every other week   . ferrous sulfate 325 (65 FE) MG tablet Take 325 mg by mouth daily.  Marland Kitchen  FLUoxetine (PROZAC) 40 MG capsule Take 40 mg by mouth daily.  . folic acid (FOLVITE) 1 MG tablet Take 1 tablet (1 mg total) by mouth daily.  . furosemide (LASIX) 40 MG tablet Take 2 tablets (80 mg total) by mouth every morning AND 1 tablet (40 mg total) every evening.  . gabapentin (NEURONTIN) 100 MG capsule Take two 136m tablets twice daily  . HYDROcodone-acetaminophen (NORCO) 10-325 MG tablet Take 1 tablet by mouth every 6 (six) hours as needed for moderate pain.   . metoprolol succinate (TOPROL-XL) 25 MG 24 hr tablet TAKE 1 TABLET BY MOUTH EVERY DAY  . Misc Natural Products (OSTEO BI-FLEX JOINT SHIELD PO) Take 1 capsule by mouth daily.  . Nintedanib (OFEV) 100 MG CAPS Take 1 capsule (100 mg total) by mouth 2 (two) times daily.  . nitroGLYCERIN (NITROSTAT) 0.4 MG SL tablet Place 1 tablet (0.4 mg total) under the tongue every 5 (five) minutes x 3 doses as needed.  . Omega-3 Fatty Acids (FISH OIL) 1200 MG CAPS Take 1,200 mg by mouth daily.   . potassium chloride SA (K-DUR,KLOR-CON) 20 MEQ tablet Take 1 tablet (20 mEq total) by mouth daily.  . rivaroxaban (XARELTO) 2.5 MG TABS tablet Take 1 tablet (2.5 mg total) by mouth 2 (two) times daily.  . rosuvastatin (CRESTOR) 10 MG tablet Take 1 tablet (10 mg total) by mouth every other day.  . senna (SENOKOT) 8.6 MG tablet Take 1 tablet by mouth daily as needed.   .Marland Kitchenspironolactone (ALDACTONE) 25 MG tablet Take 1 tablet (25 mg total) by mouth daily.  .Marland KitchensulfaSALAzine (AZULFIDINE) 500 MG tablet Take 1 tablet (500 mg total) by mouth 2 (two) times daily.     Allergies:   Doxycycline; Mercaptopurine; Simvastatin; Sulfasalazine; Penicillins; and Sulfa antibiotics   Social History   Tobacco Use  . Smoking status: Former Smoker    Packs/day: 1.00    Years: 40.00    Pack years: 40.00    Types: Cigarettes    Start date: 06/02/1957    Last attempt to quit: 09/2017    Years since quitting: 1.3  . Smokeless tobacco: Never Used  Substance Use Topics  .  Alcohol use: No    Alcohol/week: 0.0 standard drinks  . Drug use: No     Family Hx: The patient's family history includes Breast cancer in her sister; Deep vein thrombosis in her brother; Diabetes type II in her mother; Heart attack (age of onset: 656 in her mother; Heart disease in her mother; Hyperlipidemia in her sister; Hypertension in her sister; Other in her brother; Uterine cancer in her mother; Varicose Veins in her mother. There is no history of Colon cancer.  ROS:   Please see the history of present illness.    All other systems reviewed and are negative.  Prior CV studies:   The following studies were reviewed today:  Echocardiogram 11/03/2018:  1. The left ventricle has normal systolic function, with an ejection fraction of 55-60%. The cavity size was normal. Left ventricular diastolic Doppler parameters are consistent with impaired relaxation. Elevated mean left atrial pressure.  2. The right ventricle has moderately reduced systolic function. The cavity was moderately enlarged. There is no increase in right ventricular wall thickness. Right ventricular systolic pressure is severely elevated with an estimated pressure of 86.6  mmHg.  3. Lateral annulus peak S velocity 8.7 cm/s. TAPSE 9.6 mm.  4. Right atrial size was moderately dilated.  5. Tricuspid valve regurgitation is moderate.  6. The aortic valve is tricuspid Moderate thickening of the aortic valve Moderate calcification of the aortic valve.  7. The inferior vena cava was dilated in size with <50% respiratory variability.  Labs/Other Tests and Data Reviewed:    EKG:  An ECG dated 05/07/2018 was personally reviewed today and demonstrated:  Sinus rhythm with LBBB.  Recent Labs: 08/13/2018: Hemoglobin 12.9; Platelets 184 11/13/2018: ALT 20; BUN 18; Creatinine, Ser 0.97; Potassium 4.0; Sodium 137   Recent Lipid Panel Lab Results  Component Value Date/Time   CHOL 133 07/20/2018 01:49 PM   TRIG 73 07/20/2018 01:49 PM    HDL 62 07/20/2018 01:49 PM   CHOLHDL 2.1 07/20/2018 01:49 PM   CHOLHDL 2.0 01/22/2018 12:03 PM   LDLCALC 56 07/20/2018 01:49 PM   LDLDIRECT 55 07/20/2018 01:49 PM    Wt Readings from Last 3 Encounters:  01/25/19 205 lb (93 kg)  11/03/18 218 lb 6 oz (99.1 kg)  11/03/18 214 lb 9.6 oz (97.3 kg)     Objective:    Vital Signs:  Ht _0  (1.651 m)   Wt 205 lb (93 kg)   BMI 34.11 kg/m    She was not able to check vital signs today. Patient spoke in full sentences, not short of breath on the phone. No audible wheezing or coughing. Speech pattern normal.  ASSESSMENT & PLAN:    1.  Chronic diastolic heart failure, LVEF 55 to 60% range.  She is doing well in terms of volume status, weight is down significantly as outlined above.  We will plan follow-up BMET to ensure stability and renal function and potassium on current diuretic regimen.  Otherwise continue Aldactone with potassium supplements.  2.  Pulmonary arterial hypertension, severe range in the setting of COPD, OSA, and idiopathic pulmonary fibrosis.  She continues to follow with Pulmonary.  3.  CAD, no active angina symptoms at this time.  Continue aspirin, low-dose rivaroxaban, and Crestor.  4.  Essential hypertension, no change to current regimen.  COVID-19 Education: The signs and symptoms of COVID-19 were discussed with the patient and how to seek care for testing (follow up with PCP or arrange E-visit).  The importance of social distancing was discussed today.  Time:   Today, I have spent 8 minutes with the patient with telehealth technology discussing the above problems.     Medication Adjustments/Labs and Tests Ordered: Current medicines are reviewed at length with the patient today.  Concerns regarding medicines are outlined above.   Tests Ordered: Orders Placed This Encounter  Procedures  . Basic metabolic panel    Medication Changes: No orders of the defined types were placed in this encounter.    Disposition:  Follow up 4 months in the [place] office.  Signed, Rozann Lesches, MD  01/25/2019 11:20 AM    [facility]

## 2019-01-25 ENCOUNTER — Encounter: Payer: Self-pay | Admitting: Cardiology

## 2019-01-25 ENCOUNTER — Telehealth (INDEPENDENT_AMBULATORY_CARE_PROVIDER_SITE_OTHER): Payer: PPO | Admitting: Cardiology

## 2019-01-25 VITALS — Ht 65.0 in | Wt 205.0 lb

## 2019-01-25 DIAGNOSIS — Z7189 Other specified counseling: Secondary | ICD-10-CM

## 2019-01-25 DIAGNOSIS — J84112 Idiopathic pulmonary fibrosis: Secondary | ICD-10-CM

## 2019-01-25 DIAGNOSIS — I2721 Secondary pulmonary arterial hypertension: Secondary | ICD-10-CM

## 2019-01-25 DIAGNOSIS — J449 Chronic obstructive pulmonary disease, unspecified: Secondary | ICD-10-CM

## 2019-01-25 DIAGNOSIS — I2510 Atherosclerotic heart disease of native coronary artery without angina pectoris: Secondary | ICD-10-CM

## 2019-01-25 DIAGNOSIS — I5032 Chronic diastolic (congestive) heart failure: Secondary | ICD-10-CM | POA: Diagnosis not present

## 2019-01-25 DIAGNOSIS — J9611 Chronic respiratory failure with hypoxia: Secondary | ICD-10-CM

## 2019-01-25 NOTE — Patient Instructions (Addendum)
Medication Instructions:   Your physician recommends that you continue on your current medications as directed. Please refer to the Current Medication list given to you today.  Labwork:  Your physician recommends that you return for lab work in: as soon as possible to check your BMET. You may have this done at [facility].  Testing/Procedures:  NONE  Follow-Up:  Your physician recommends that you schedule a follow-up appointment in: 4 month at the [facility] office with Dr. Domenic Polite. You will receive a reminder letter in the mail in about 2 months reminding you to call and schedule your appointment. If you don't receive this letter, please contact our office.  Any Other Special Instructions Will Be Listed Below (If Applicable).  If you need a refill on your cardiac medications before your next appointment, please call your pharmacy.

## 2019-01-28 DIAGNOSIS — R2689 Other abnormalities of gait and mobility: Secondary | ICD-10-CM | POA: Diagnosis not present

## 2019-01-28 DIAGNOSIS — I5032 Chronic diastolic (congestive) heart failure: Secondary | ICD-10-CM | POA: Diagnosis not present

## 2019-01-28 DIAGNOSIS — J449 Chronic obstructive pulmonary disease, unspecified: Secondary | ICD-10-CM | POA: Diagnosis not present

## 2019-01-28 DIAGNOSIS — R062 Wheezing: Secondary | ICD-10-CM | POA: Diagnosis not present

## 2019-02-01 ENCOUNTER — Telehealth: Payer: Self-pay | Admitting: *Deleted

## 2019-02-01 ENCOUNTER — Other Ambulatory Visit (HOSPITAL_COMMUNITY)
Admission: RE | Admit: 2019-02-01 | Discharge: 2019-02-01 | Disposition: A | Discharge status: Home / Self Care | Payer: PPO | Source: Ambulatory Visit | Attending: Cardiology | Admitting: Cardiology

## 2019-02-01 DIAGNOSIS — I5032 Chronic diastolic (congestive) heart failure: Secondary | ICD-10-CM | POA: Insufficient documentation

## 2019-02-01 LAB — BASIC METABOLIC PANEL
Anion gap: 12 (ref 5–15)
BUN: 30 mg/dL — ABNORMAL HIGH (ref 8–23)
CO2: 24 mmol/L (ref 22–32)
Calcium: 9.2 mg/dL (ref 8.9–10.3)
Chloride: 105 mmol/L (ref 98–111)
Creatinine, Ser: 0.97 mg/dL (ref 0.44–1.00)
GFR calc Af Amer: >60 mL/min (ref >60)
GFR calc non Af Amer: 56 mL/min — ABNORMAL LOW (ref >60)
Glucose, Bld: 106 mg/dL — ABNORMAL HIGH (ref 70–99)
Potassium: 3.7 mmol/L (ref 3.5–5.1)
Sodium: 141 mmol/L (ref 135–145)

## 2019-02-01 NOTE — Telephone Encounter (Signed)
-----  Message from Satira Sark, MD sent at 02/01/2019 12:45 PM EDT ----- Results reviewed.  Potassium and creatinine remain normal, no change in current medications.

## 2019-02-01 NOTE — Telephone Encounter (Signed)
Patient informed. Copy sen to PCP

## 2019-02-02 ENCOUNTER — Other Ambulatory Visit: Payer: Self-pay

## 2019-02-02 DIAGNOSIS — I6523 Occlusion and stenosis of bilateral carotid arteries: Secondary | ICD-10-CM

## 2019-02-02 DIAGNOSIS — I739 Peripheral vascular disease, unspecified: Secondary | ICD-10-CM

## 2019-02-04 ENCOUNTER — Ambulatory Visit: Payer: PPO | Admitting: Family

## 2019-02-04 ENCOUNTER — Ambulatory Visit (INDEPENDENT_AMBULATORY_CARE_PROVIDER_SITE_OTHER)
Admission: RE | Admit: 2019-02-04 | Discharge: 2019-02-04 | Disposition: A | Discharge status: Home / Self Care | Payer: PPO | Source: Ambulatory Visit | Attending: Family | Admitting: Family

## 2019-02-04 ENCOUNTER — Other Ambulatory Visit: Payer: Self-pay

## 2019-02-04 ENCOUNTER — Ambulatory Visit (HOSPITAL_COMMUNITY)
Admission: RE | Admit: 2019-02-04 | Discharge: 2019-02-04 | Disposition: A | Discharge status: Home / Self Care | Payer: PPO | Source: Ambulatory Visit | Attending: Family | Admitting: Family

## 2019-02-04 ENCOUNTER — Encounter: Payer: Self-pay | Admitting: Family

## 2019-02-04 VITALS — BP 120/63 | HR 51 | Temp 97.1°F | Ht 65.0 in | Wt 205.0 lb

## 2019-02-04 DIAGNOSIS — I739 Peripheral vascular disease, unspecified: Secondary | ICD-10-CM | POA: Insufficient documentation

## 2019-02-04 DIAGNOSIS — I6523 Occlusion and stenosis of bilateral carotid arteries: Secondary | ICD-10-CM | POA: Insufficient documentation

## 2019-02-04 DIAGNOSIS — Z87891 Personal history of nicotine dependence: Secondary | ICD-10-CM

## 2019-02-04 DIAGNOSIS — I872 Venous insufficiency (chronic) (peripheral): Secondary | ICD-10-CM

## 2019-02-04 DIAGNOSIS — I779 Disorder of arteries and arterioles, unspecified: Secondary | ICD-10-CM

## 2019-02-04 DIAGNOSIS — Z8673 Personal history of transient ischemic attack (TIA), and cerebral infarction without residual deficits: Secondary | ICD-10-CM | POA: Diagnosis not present

## 2019-02-04 NOTE — Patient Instructions (Addendum)
To decrease swelling in your feet and legs: Elevate feet above slightly bent knees, feet above heart, overnight and 3-4 times per day for 20 minutes.    Chronic Venous Insufficiency Chronic venous insufficiency, also called venous stasis, is a condition that prevents blood from being pumped effectively through the veins in your legs. Blood may no longer be pumped effectively from the legs back to the heart. This condition can range from mild to severe. With proper treatment, you should be able to continue with an active life. What are the causes? Chronic venous insufficiency occurs when the vein walls become stretched, weakened, or damaged, or when valves within the vein are damaged. Some common causes of this include:  High blood pressure inside the veins (venous hypertension).  Increased blood pressure in the leg veins from long periods of sitting or standing.  A blood clot that blocks blood flow in a vein (deep vein thrombosis, DVT).  Inflammation of a vein (phlebitis) that causes a blood clot to form.  Tumors in the pelvis that cause blood to back up. What increases the risk? The following factors may make you more likely to develop this condition:  Having a family history of this condition.  Obesity.  Pregnancy.  Living without enough physical activity or exercise (sedentary lifestyle).  Smoking.  Having a job that requires long periods of standing or sitting in one place.  Being a certain age. Women in their 80s and 80s and men in their 80s are more likely to develop this condition. What are the signs or symptoms? Symptoms of this condition include:  Veins that are enlarged, bulging, or twisted (varicose veins).  Skin breakdown or ulcers.  Reddened or discolored skin on the front of the leg.  Brown, smooth, tight, and painful skin just above the ankle, usually on the inside of the leg (lipodermatosclerosis).  Swelling. How is this diagnosed? This condition may  be diagnosed based on:  Your medical history.  A physical exam.  Tests, such as: ? A procedure that creates an image of a blood vessel and nearby organs and provides information about blood flow through the blood vessel (duplex ultrasound). ? A procedure that tests blood flow (plethysmography). ? A procedure to look at the veins using X-ray and dye (venogram). How is this treated? The goals of treatment are to help you return to an active life and to minimize pain or disability. Treatment depends on the severity of your condition, and it may include:  Wearing compression stockings. These can help relieve symptoms and help prevent your condition from getting worse. However, they do not cure the condition.  Sclerotherapy. This is a procedure involving an injection of a material that "dissolves" damaged veins.  Surgery. This may involve: ? Removing a diseased vein (vein stripping). ? Cutting off blood flow through the vein (laser ablation surgery). ? Repairing a valve. Follow these instructions at home:      Wear compression stockings as told by your health care provider. These stockings help to prevent blood clots and reduce swelling in your legs.  Take over-the-counter and prescription medicines only as told by your health care provider.  Stay active by exercising, walking, or doing different activities. Ask your health care provider what activities are safe for you and how much exercise you need.  Drink enough fluid to keep your urine clear or pale yellow.  Do not use any products that contain nicotine or tobacco, such as cigarettes and e-cigarettes. If you need help quitting,  ask your health care provider.  Keep all follow-up visits as told by your health care provider. This is important. Contact a health care provider if:  You have redness, swelling, or more pain in the affected area.  You see a red streak or line that extends up or down from the affected area.  You have  skin breakdown or a loss of skin in the affected area, even if the breakdown is small.  You get an injury in the affected area. Get help right away if:  You get an injury and an open wound in the affected area.  You have severe pain that does not get better with medicine.  You have sudden numbness or weakness in the foot or ankle below the affected area, or you have trouble moving your foot or ankle.  You have a fever and you have worse or persistent symptoms.  You have chest pain.  You have shortness of breath. Summary  Chronic venous insufficiency, also called venous stasis, is a condition that prevents blood from being pumped effectively through the veins in your legs.  Chronic venous insufficiency occurs when the vein walls become stretched, weakened, or damaged, or when valves within the vein are damaged.  Treatment for this condition depends on how severe your condition is, and it may involve wearing compression stockings or having a procedure.  Make sure you stay active by exercising, walking, or doing different activities. Ask your health care provider what activities are safe for you and how much exercise you need. This information is not intended to replace advice given to you by your health care provider. Make sure you discuss any questions you have with your health care provider. Document Released: 12/16/2006 Document Revised: 07/01/2016 Document Reviewed: 07/01/2016 Elsevier Interactive Patient Education  2019 [employer].        Stroke Prevention Some medical conditions and lifestyle choices can lead to a higher risk for a stroke. You can help to prevent a stroke by making nutrition, lifestyle, and other changes. What nutrition changes can be made?   Eat healthy foods. ? Choose foods that are high in fiber. These include:  Fresh fruits.  Fresh vegetables.  Whole grains. ? Eat at least 5 or more servings of fruits and vegetables each day. Try to fill  half of your plate at each meal with fruits and vegetables. ? Choose lean protein foods. These include:  Lowfat (lean) cuts of meat.  Chicken without skin.  Fish.  Tofu.  Beans.  Nuts. ? Eat low-fat dairy products. ? Avoid foods that:  Are high in salt (sodium).  Have saturated fat.  Have trans fat.  Have cholesterol.  Are processed.  Are premade.  Follow eating guidelines as told by your doctor. These may include: ? Reducing how many calories you eat and drink each day. ? Limiting how much salt you eat or drink each day to 1,500 milligrams (mg). ? Using only healthy fats for cooking. These include:  Olive oil.  Canola oil.  Sunflower oil. ? Counting how many carbohydrates you eat and drink each day. What lifestyle changes can be made?  Try to stay at a healthy weight. Talk to your doctor about what a good weight is for you.  Get at least 30 minutes of moderate physical activity at least 5 days a week. This can include: ? Fast walking. ? Biking. ? Swimming.  Do not use any products that have nicotine or tobacco. This includes cigarettes and e-cigarettes. If you  need help quitting, ask your doctor. Avoid being around tobacco smoke in general.  Limit how much alcohol you drink to no more than 1 drink a day for nonpregnant women and 2 drinks a day for men. One drink equals 12 oz of beer, 5 oz of wine, or 1 oz of hard liquor.  Do not use drugs.  Avoid taking birth control pills. Talk to your doctor about the risks of taking birth control pills if: ? You are over 11 years old. ? You smoke. ? You get migraines. ? You have had a blood clot. What other changes can be made?  Manage your cholesterol. ? It is important to eat a healthy diet. ? If your cholesterol cannot be managed through your diet, you may also need to take medicines. Take medicines as told by your doctor.  Manage your diabetes. ? It is important to eat a healthy diet and to exercise  regularly. ? If your blood sugar cannot be managed through diet and exercise, you may need to take medicines. Take medicines as told by your doctor.  Control your high blood pressure (hypertension). ? Try to keep your blood pressure below 130/80. This can help lower your risk of stroke. ? It is important to eat a healthy diet and to exercise regularly. ? If your blood pressure cannot be managed through diet and exercise, you may need to take medicines. Take medicines as told by your doctor. ? Ask your doctor if you should check your blood pressure at home. ? Have your blood pressure checked every year. Do this even if your blood pressure is normal.  Talk to your doctor about getting checked for a sleep disorder. Signs of this can include: ? Snoring a lot. ? Feeling very tired.  Take over-the-counter and prescription medicines only as told by your doctor. These may include aspirin or blood thinners (antiplatelets or anticoagulants).  Make sure that any other medical conditions you have are managed. Where to find more information  American Stroke Association: www.strokeassociation.org  National Stroke Association: www.stroke.org Get help right away if:  You have any symptoms of stroke. "BE FAST" is an easy way to remember the main warning signs: ? B - Balance. Signs are dizziness, sudden trouble walking, or loss of balance. ? E - Eyes. Signs are trouble seeing or a sudden change in how you see. ? F - Face. Signs are sudden weakness or loss of feeling of the face, or the face or eyelid drooping on one side. ? A - Arms. Signs are weakness or loss of feeling in an arm. This happens suddenly and usually on one side of the body. ? S - Speech. Signs are sudden trouble speaking, slurred speech, or trouble understanding what people say. ? T - Time. Time to call emergency services. Write down what time symptoms started.  You have other signs of stroke, such as: ? A sudden, very bad headache  with no known cause. ? Feeling sick to your stomach (nausea). ? Throwing up (vomiting). ? Jerky movements you cannot control (seizure). These symptoms may represent a serious problem that is an emergency. Do not wait to see if the symptoms will go away. Get medical help right away. Call your local emergency services (911 in the [place] Do not drive yourself to the hospital. Summary  You can prevent a stroke by eating healthy, exercising, not smoking, drinking less alcohol, and treating other health problems, such as diabetes, high blood pressure, or high cholesterol.  Do  not use any products that contain nicotine or tobacco, such as cigarettes and e-cigarettes.  Get help right away if you have any signs or symptoms of a stroke. This information is not intended to replace advice given to you by your health care provider. Make sure you discuss any questions you have with your health care provider. Document Released: 02/11/2012 Document Revised: 11/13/2016 Document Reviewed: 11/13/2016 Elsevier Interactive Patient Education  2019 [place].     Peripheral Vascular Disease  Peripheral vascular disease (PVD) is a disease of the blood vessels that are not part of your heart and brain. A simple term for PVD is poor circulation. In most cases, PVD narrows the blood vessels that carry blood from your heart to the rest of your body. This can reduce the supply of blood to your arms, legs, and internal organs, like your stomach or kidneys. However, PVD most often affects a person's lower legs and feet. Without treatment, PVD tends to get worse. PVD can also lead to acute ischemic limb. This is when an arm or leg suddenly cannot get enough blood. This is a medical emergency. Follow these instructions at home: Lifestyle  Do not use any products that contain nicotine or tobacco, such as cigarettes and e-cigarettes. If you need help quitting, ask your doctor.  Lose weight if you are overweight. Or,  stay at a healthy weight as told by your doctor.  Eat a diet that is low in fat and cholesterol. If you need help, ask your doctor.  Exercise regularly. Ask your doctor for activities that are right for you. General instructions  Take over-the-counter and prescription medicines only as told by your doctor.  Take good care of your feet: ? Wear comfortable shoes that fit well. ? Check your feet often for any cuts or sores.  Keep all follow-up visits as told by your doctor This is important. Contact a doctor if:  You have cramps in your legs when you walk.  You have leg pain when you are at rest.  You have coldness in a leg or foot.  Your skin changes.  You are unable to get or have an erection (erectile dysfunction).  You have cuts or sores on your feet that do not heal. Get help right away if:  Your arm or leg turns cold, numb, and blue.  Your arms or legs become red, warm, swollen, painful, or numb.  You have chest pain.  You have trouble breathing.  You suddenly have weakness in your face, arm, or leg.  You become very confused or you cannot speak.  You suddenly have a very bad headache.  You suddenly cannot see. Summary  Peripheral vascular disease (PVD) is a disease of the blood vessels.  A simple term for PVD is poor circulation. Without treatment, PVD tends to get worse.  Treatment may include exercise, low fat and low cholesterol diet, and quitting smoking. This information is not intended to replace advice given to you by your health care provider. Make sure you discuss any questions you have with your health care provider. Document Released: 11/06/2009 Document Revised: 09/19/2016 Document Reviewed: 09/19/2016 Elsevier Interactive Patient Education  2019 [employer].

## 2019-02-04 NOTE — Progress Notes (Addendum)
[facility] [place] HISTORY AND PHYSICAL   CC: Follow up extracranial carotid artery stenosis and peripheral artery occlusive disease    History of Present Illness:   Shelley [name] is a 78 y.o. female whomDr. Philis Nettle been monitoringwith a history of carotid artery stenosis and no history of carotid intervention.  She returns for surveillance.  The patient does have a history of MI and 2 cardiac stents placed in 2004 and TIA symptoms many years ago manifested as transient right arm weakness, but denies history of stroke.  She has had no subsequent stroke or TIA symptoms.  The patient denies a history of amaurosis fugax or monocular blindness, unilateral facial drooping, or receptive or expressive aphasia. ..  Shedenies current hemiplegia.  Patient denies non-healing wounds.    Dr. Oneida Alar last evaluated pt on 08-06-18. At that time her peripheral arterial disease remained stable, unchanged ABIs, no real claudication or rest pain symptoms.  She had numbness and tingling in her feet which more likely was related to peripheral neuropathy. Bilateral carotid occlusive disease was asymptomatic, would consider elective carotid intervention if greater than 80% stenosis or if she develops symptoms of TIA amaurosis or stroke. She was to follow-up with our nurse practitioner in 6 months with repeat ABIs and carotid duplex.   She was recently diagnosed with pulmonary fibrosis, sees Dr. Waunita Schooner. She uses supplemental O2.   Diabetic: No Tobacco use: former smoker (started in high school, quit several times, last cigarette was in March 2019)   Pt meds include: Statin :Yes Betablocker: Yes ASA: Yes Other anticoagulants/antiplatelets: Xarelto prescribed by Dr. Aundra Dubin based on the [facility] study  Current Outpatient Medications  Medication Sig Dispense Refill  . albuterol (PROVENTIL) (2.5 MG/3ML) 0.083% nebulizer solution Take 3 mLs (2.5 mg total) by nebulization  every 6 (six) hours. Dx COPD J44.9 75 mL 11  . antiseptic oral rinse (BIOTENE) LIQD 15 mLs by Mouth Rinse route as needed for dry mouth. 1 Bottle 2  . aspirin 81 MG tablet Take 81 mg by mouth daily.      . calcium citrate-vitamin D (CITRACAL+D) 315-200 MG-UNIT tablet Take 1 tablet by mouth daily.     . Cyanocobalamin (B-12 PO) Take 1 tablet by mouth See admin instructions. Take 1 capsule every day x 7 days every other week     . ferrous sulfate 325 (65 FE) MG tablet Take 325 mg by mouth daily.    Marland Kitchen FLUoxetine (PROZAC) 40 MG capsule Take 40 mg by mouth daily.    . folic acid (FOLVITE) 1 MG tablet Take 1 tablet (1 mg total) by mouth daily. 90 tablet 4  . furosemide (LASIX) 40 MG tablet Take 2 tablets (80 mg total) by mouth every morning AND 1 tablet (40 mg total) every evening. 270 tablet 3  . gabapentin (NEURONTIN) 100 MG capsule Take two 189m tablets twice daily 120 capsule 6  . HYDROcodone-acetaminophen (NORCO) 10-325 MG tablet Take 1 tablet by mouth every 6 (six) hours as needed for moderate pain.     . metoprolol succinate (TOPROL-XL) 25 MG 24 hr tablet TAKE 1 TABLET BY MOUTH EVERY DAY 90 tablet 2  . Misc Natural Products (OSTEO BI-FLEX JOINT SHIELD PO) Take 1 capsule by mouth daily.    . Nintedanib (OFEV) 100 MG CAPS Take 1 capsule (100 mg total) by mouth 2 (two) times daily. 60 capsule 11  . nitroGLYCERIN (NITROSTAT) 0.4 MG SL tablet Place 1 tablet (0.4 mg total) under the tongue every 5 (  five) minutes x 3 doses as needed. 25 tablet 3  . Omega-3 Fatty Acids (FISH OIL) 1200 MG CAPS Take 1,200 mg by mouth daily.     . potassium chloride SA (K-DUR,KLOR-CON) 20 MEQ tablet Take 1 tablet (20 mEq total) by mouth daily. 30 tablet 3  . rivaroxaban (XARELTO) 2.5 MG TABS tablet Take 1 tablet (2.5 mg total) by mouth 2 (two) times daily. 60 tablet 6  . rosuvastatin (CRESTOR) 10 MG tablet Take 1 tablet (10 mg total) by mouth every other day.    . senna (SENOKOT) 8.6 MG tablet Take 1 tablet by mouth daily  as needed.     Marland Kitchen spironolactone (ALDACTONE) 25 MG tablet Take 1 tablet (25 mg total) by mouth daily. 90 tablet 3  . sulfaSALAzine (AZULFIDINE) 500 MG tablet Take 1 tablet (500 mg total) by mouth 2 (two) times daily. 60 tablet 6   No current facility-administered medications for this visit.     Past Medical History:  Diagnosis Date  . Anxiety   . Carotid artery disease ([place])    03-49% RICA and LICA 1/79 - Dr. Oneida Alar  . Cataract   . COPD (chronic obstructive pulmonary disease) ([place])   . Coronary atherosclerosis of native coronary artery    BMS to RCA 2004  . Depression   . DVT (deep venous thrombosis) ([place])   . Essential hypertension, benign   . Fracture Left Great Toe  . Hepatic steatosis   . History of melanoma   . History of oral aphthous ulcers   . History of Salmonella gastroenteritis   . Hx of adenomatous colonic polyps   . Mixed hyperlipidemia   . Myocardial infarction ([place])    IMI 10/04  . Osteoarthritis   . Osteoporosis   . Pancreatitis   . Rectal bleeding   . Regional enteritis of large intestine ([place])   . Sinus polyp   . TIA (transient ischemic attack)   . Tubular adenoma of colon 08/2012    Social History Social History   Tobacco Use  . Smoking status: Former Smoker    Packs/day: 1.00    Years: 40.00    Pack years: 40.00    Types: Cigarettes    Start date: 06/02/1957    Quit date: 09/2017    Years since quitting: 1.3  . Smokeless tobacco: Never Used  Substance Use Topics  . Alcohol use: No    Alcohol/week: 0.0 standard drinks  . Drug use: No    Family History Family History  Problem Relation Age of Onset  . Diabetes type II Mother   . Heart attack Mother 67       questionable  . Uterine cancer Mother   . Heart disease Mother        before age 71  . Varicose Veins Mother   . Other Brother        PVD and hx DVT  . Deep vein thrombosis Brother   . Breast cancer Sister   . Hyperlipidemia Sister   . Hypertension Sister   . Colon cancer Neg  Hx     Surgical History Past Surgical History:  Procedure Laterality Date  . CATARACT EXTRACTION Bilateral   . Melanoma resection  1996  . RIGHT/LEFT HEART CATH AND CORONARY ANGIOGRAPHY N/A 10/30/2017   Procedure: RIGHT/LEFT HEART CATH AND CORONARY ANGIOGRAPHY;  Surgeon: Larey Dresser, MD;  Location: [facility];  Service: Cardiovascular;  Laterality: N/A;  . TOTAL KNEE ARTHROPLASTY Right Feb 2012  Allergies  Allergen Reactions  . Doxycycline Other (See Comments)    Poss. Photosensitivity  . Mercaptopurine Other (See Comments)    REACTION: pancreatitis  . Simvastatin Other (See Comments)    Severe leg aches  . Sulfasalazine   . Penicillins Rash and Other (See Comments)    Has patient had a PCN reaction causing immediate rash, facial/tongue/throat swelling, SOB or lightheadedness with hypotension: No Has patient had a PCN reaction causing severe rash involving mucus membranes or skin necrosis: No Has patient had a PCN reaction that required hospitalization: No Has patient had a PCN reaction occurring within the last 10 years: No If all of the above answers are "NO", then may proceed with Cephalosporin use.   . Sulfa Antibiotics Rash    Current Outpatient Medications  Medication Sig Dispense Refill  . albuterol (PROVENTIL) (2.5 MG/3ML) 0.083% nebulizer solution Take 3 mLs (2.5 mg total) by nebulization every 6 (six) hours. Dx COPD J44.9 75 mL 11  . antiseptic oral rinse (BIOTENE) LIQD 15 mLs by Mouth Rinse route as needed for dry mouth. 1 Bottle 2  . aspirin 81 MG tablet Take 81 mg by mouth daily.      . calcium citrate-vitamin D (CITRACAL+D) 315-200 MG-UNIT tablet Take 1 tablet by mouth daily.     . Cyanocobalamin (B-12 PO) Take 1 tablet by mouth See admin instructions. Take 1 capsule every day x 7 days every other week     . ferrous sulfate 325 (65 FE) MG tablet Take 325 mg by mouth daily.    Marland Kitchen FLUoxetine (PROZAC) 40 MG capsule Take 40 mg by mouth daily.    . folic  acid (FOLVITE) 1 MG tablet Take 1 tablet (1 mg total) by mouth daily. 90 tablet 4  . furosemide (LASIX) 40 MG tablet Take 2 tablets (80 mg total) by mouth every morning AND 1 tablet (40 mg total) every evening. 270 tablet 3  . gabapentin (NEURONTIN) 100 MG capsule Take two 166m tablets twice daily 120 capsule 6  . HYDROcodone-acetaminophen (NORCO) 10-325 MG tablet Take 1 tablet by mouth every 6 (six) hours as needed for moderate pain.     . metoprolol succinate (TOPROL-XL) 25 MG 24 hr tablet TAKE 1 TABLET BY MOUTH EVERY DAY 90 tablet 2  . Misc Natural Products (OSTEO BI-FLEX JOINT SHIELD PO) Take 1 capsule by mouth daily.    . Nintedanib (OFEV) 100 MG CAPS Take 1 capsule (100 mg total) by mouth 2 (two) times daily. 60 capsule 11  . nitroGLYCERIN (NITROSTAT) 0.4 MG SL tablet Place 1 tablet (0.4 mg total) under the tongue every 5 (five) minutes x 3 doses as needed. 25 tablet 3  . Omega-3 Fatty Acids (FISH OIL) 1200 MG CAPS Take 1,200 mg by mouth daily.     . potassium chloride SA (K-DUR,KLOR-CON) 20 MEQ tablet Take 1 tablet (20 mEq total) by mouth daily. 30 tablet 3  . rivaroxaban (XARELTO) 2.5 MG TABS tablet Take 1 tablet (2.5 mg total) by mouth 2 (two) times daily. 60 tablet 6  . rosuvastatin (CRESTOR) 10 MG tablet Take 1 tablet (10 mg total) by mouth every other day.    . senna (SENOKOT) 8.6 MG tablet Take 1 tablet by mouth daily as needed.     .Marland Kitchenspironolactone (ALDACTONE) 25 MG tablet Take 1 tablet (25 mg total) by mouth daily. 90 tablet 3  . sulfaSALAzine (AZULFIDINE) 500 MG tablet Take 1 tablet (500 mg total) by mouth 2 (two) times daily. 60 tablet  6   No current facility-administered medications for this visit.      REVIEW OF SYSTEMS: See HPI for pertinent positives and negatives.  Physical Examination Vitals:   02/04/19 1108 02/04/19 1112  BP: 115/65 120/63  Pulse: (!) 46 (!) 51  Temp: (!) 97.1 F (36.2 C)   TempSrc: Temporal   SpO2: 90%   Weight: 205 lb (93 kg)   Height: _0   (1.651 m)    Body mass index is 34.11 kg/m.  General:  WDWN obese female in NAD Gait: slow, steady, using rolling walker with seat  HENT: no gross abnormalities  Eyes: Pupils equal Pulmonary: normal non-labored breathing, limited air movement in all fields, especially the bases, + faint rales in both bases, no rhonchi or wheezing Cardiac: Regular rhythm, bradycardic (on a beta blocker), + murmur Abdomen: softly obese, NT, no masses palpated Skin: no rashes, no ulcers, no cellulitis.   VASCULAR EXAM  Carotid Bruits Right Left   Positive Negative      Radial pulses are 1+ palpable bilaterally   Adominal aortic pulse is not palpable                      VASCULAR EXAM: Extremities without ischemic changes, without Gangrene; without open wounds. 2+ non pitting edema in both lower legs and feet, red-purple discororation of skin, no ulcers, manu small varicosities                                                                                                         LE Pulses Right Left       FEMORAL  not palpable, seated, obese  not palpable        POPLITEAL  not palpable   not palpable       POSTERIOR TIBIAL  not palpable   not palpable        DORSALIS PEDIS      ANTERIOR TIBIAL not palpable  not palpable     Musculoskeletal: no muscle wasting or atrophy; no peripheral edema  Neurologic:  A&O X 3; appropriate affect, sensation is normal; speech is normal, CN 2-12 intact, pain and light touch intact in extremities, motor exam as listed above. Psychiatric: Normal thought content, mood appropriate to clinical situation.    DATA  Carotid Duplex (02-04-19): Right Carotid: Velocities in the right ICA are consistent with a 60-79%                stenosis. Non-hemodynamically significant plaque <50% noted in                the CCA. The ECA appears <50% stenosed.  Left Carotid: Velocities in the left ICA are consistent with a 40-59% stenosis.               Non-hemodynamically  significant plaque noted in the CCA. The ECA               appears <50% stenosed.  Vertebrals:  Bilateral vertebral arteries demonstrate antegrade flow. Subclavians: Right subclavian artery flow was disturbed. Normal  flow              hemodynamics were seen in the left subclavian artery. No change compared to 08-06-18.    ABI (Date: 02/04/2019): ABI Findings: +---------+------------------+-----+----------+--------------------------------+ Right    Rt Pressure (mmHg)IndexWaveform  Comment                          +---------+------------------+-----+----------+--------------------------------+ Brachial 118                                                               +---------+------------------+-----+----------+--------------------------------+ PTA      133               1.13 triphasic                                  +---------+------------------+-----+----------+--------------------------------+ DP       121               1.03 monophasicmay be falsely elevated; sharp                                             upstroke                         +---------+------------------+-----+----------+--------------------------------+ Great Toe59                0.50                                            +---------+------------------+-----+----------+--------------------------------+  +---------+------------------+-----+----------+-------+ Left     Lt Pressure (mmHg)IndexWaveform  Comment +---------+------------------+-----+----------+-------+ Brachial 117                                      +---------+------------------+-----+----------+-------+ PTA      87                0.74 monophasic        +---------+------------------+-----+----------+-------+ DP       83                0.70 monophasic        +---------+------------------+-----+----------+-------+ Great Toe48                0.41                    +---------+------------------+-----+----------+-------+  +-------+-----------+-----------+------------+------------+ ABI/TBIToday's ABIToday's TBIPrevious ABIPrevious TBI +-------+-----------+-----------+------------+------------+ Right  1.13       0.50       1.05        0.54         +-------+-----------+-----------+------------+------------+ Left   0.74       0.41       0.73        0.53         +-------+-----------+-----------+------------+------------+  Bilateral ABIs and TBIs appear essentially unchanged compared to prior study on 08/06/2018.  Summary: Right: The right toe-brachial index is abnormal. ABIs are unreliable. RT great toe pressure = 59 mmHg. Although ankle brachial indices are within normal limits (0.95-1.29), arterial Doppler waveforms at the ankle suggest some component of arterial occlusive disease. Left: Resting left ankle-brachial index indicates moderate left lower extremity arterial disease. The left toe-brachial index is abnormal. LT Great toe pressure = 48 mmHg.   ASSESSMENT:  Shelley [name] is a 78 y.o. female who we have been monitoring for extracranial carotid artery stenosis and PAOD.   - PAD ; Stable bilateral ABI, no signs of ischemia in her feet or legs. Daily seated leg exercises discussed and demonstrated. I am not encouraging extensive walking since she reports being unsteady on her feet at times, uses a rolling walker  - Chronic venous insufficiency: elevation of legs and knee high compression hose, minimize legs being in a dependent position   - Extracranial carotid artery stenosis: TIA symptoms many years ago, no neurological events since then. Bilateral extracranial ICA stenosis remains stable by duplex today: 60-79% right ICA stenosis, 40-59% left ICA stenosis.   She take Xarelto, 81 mg ASA, and a statin.  At pt request I spoke with her daughter Claiborne Billings by phone.    PLAN:   Based on today's exam and non-invasive  vascular lab results, the patient will follow up in 6 months with the following tests: carotid duplex and ABI's.  Elevation of legs, see Pt instructions   I discussed in depth with the patient the nature of atherosclerosis, and emphasized the importance of maximal medical management including strict control of blood pressure, blood glucose, and lipid levels, obtaining regular exercise, and cessation of smoking.  The patient is aware that without maximal medical management the underlying atherosclerotic disease process will progress, limiting the benefit of any interventions.  The patient was given information about stroke prevention and what symptoms should prompt the patient to seek immediate medical care.  The patient was given information about PAD including signs, symptoms, treatment, what symptoms should prompt the patient to seek immediate medical care, and risk reduction measures to take.  Thank you for allowing [place] to participate in this patient's care.  Clemon Chambers, RN, MSN, FNP-C [facility] Office: [phone]  Clinic MD: [facility] 02/04/2019 11:30 AM

## 2019-02-11 ENCOUNTER — Other Ambulatory Visit: Payer: Self-pay | Admitting: Gastroenterology

## 2019-02-14 ENCOUNTER — Other Ambulatory Visit (HOSPITAL_COMMUNITY): Payer: Self-pay | Admitting: Cardiology

## 2019-02-14 ENCOUNTER — Other Ambulatory Visit: Payer: Self-pay | Admitting: Cardiology

## 2019-02-15 ENCOUNTER — Telehealth (HOSPITAL_COMMUNITY): Payer: Self-pay

## 2019-02-15 NOTE — Telephone Encounter (Signed)

## 2019-02-16 ENCOUNTER — Encounter (HOSPITAL_COMMUNITY): Payer: Self-pay | Admitting: Cardiology

## 2019-02-16 ENCOUNTER — Ambulatory Visit (HOSPITAL_COMMUNITY)
Admission: RE | Admit: 2019-02-16 | Discharge: 2019-02-16 | Disposition: A | Discharge status: Home / Self Care | Payer: PPO | Source: Ambulatory Visit | Attending: Cardiology | Admitting: Cardiology

## 2019-02-16 ENCOUNTER — Other Ambulatory Visit: Payer: Self-pay

## 2019-02-16 VITALS — BP 134/80 | HR 53 | Wt 202.6 lb

## 2019-02-16 DIAGNOSIS — G4733 Obstructive sleep apnea (adult) (pediatric): Secondary | ICD-10-CM | POA: Insufficient documentation

## 2019-02-16 DIAGNOSIS — I739 Peripheral vascular disease, unspecified: Secondary | ICD-10-CM | POA: Insufficient documentation

## 2019-02-16 DIAGNOSIS — Z8249 Family history of ischemic heart disease and other diseases of the circulatory system: Secondary | ICD-10-CM | POA: Insufficient documentation

## 2019-02-16 DIAGNOSIS — R7302 Impaired glucose tolerance (oral): Secondary | ICD-10-CM | POA: Diagnosis not present

## 2019-02-16 DIAGNOSIS — I5032 Chronic diastolic (congestive) heart failure: Secondary | ICD-10-CM | POA: Insufficient documentation

## 2019-02-16 DIAGNOSIS — Z86718 Personal history of other venous thrombosis and embolism: Secondary | ICD-10-CM | POA: Insufficient documentation

## 2019-02-16 DIAGNOSIS — Z7901 Long term (current) use of anticoagulants: Secondary | ICD-10-CM | POA: Diagnosis not present

## 2019-02-16 DIAGNOSIS — Z8673 Personal history of transient ischemic attack (TIA), and cerebral infarction without residual deficits: Secondary | ICD-10-CM | POA: Diagnosis not present

## 2019-02-16 DIAGNOSIS — Z7982 Long term (current) use of aspirin: Secondary | ICD-10-CM | POA: Insufficient documentation

## 2019-02-16 DIAGNOSIS — I6523 Occlusion and stenosis of bilateral carotid arteries: Secondary | ICD-10-CM | POA: Diagnosis not present

## 2019-02-16 DIAGNOSIS — I110 Hypertensive heart disease with heart failure: Secondary | ICD-10-CM | POA: Diagnosis not present

## 2019-02-16 DIAGNOSIS — E785 Hyperlipidemia, unspecified: Secondary | ICD-10-CM | POA: Insufficient documentation

## 2019-02-16 DIAGNOSIS — J84112 Idiopathic pulmonary fibrosis: Secondary | ICD-10-CM | POA: Diagnosis not present

## 2019-02-16 DIAGNOSIS — F329 Major depressive disorder, single episode, unspecified: Secondary | ICD-10-CM | POA: Diagnosis not present

## 2019-02-16 DIAGNOSIS — Z79899 Other long term (current) drug therapy: Secondary | ICD-10-CM | POA: Insufficient documentation

## 2019-02-16 DIAGNOSIS — R5382 Chronic fatigue, unspecified: Secondary | ICD-10-CM | POA: Diagnosis not present

## 2019-02-16 DIAGNOSIS — Z87891 Personal history of nicotine dependence: Secondary | ICD-10-CM | POA: Insufficient documentation

## 2019-02-16 DIAGNOSIS — K5090 Crohn's disease, unspecified, without complications: Secondary | ICD-10-CM | POA: Insufficient documentation

## 2019-02-16 DIAGNOSIS — Z13220 Encounter for screening for lipoid disorders: Secondary | ICD-10-CM | POA: Diagnosis not present

## 2019-02-16 DIAGNOSIS — Z85820 Personal history of malignant melanoma of skin: Secondary | ICD-10-CM | POA: Insufficient documentation

## 2019-02-16 DIAGNOSIS — Z9181 History of falling: Secondary | ICD-10-CM | POA: Diagnosis not present

## 2019-02-16 DIAGNOSIS — J449 Chronic obstructive pulmonary disease, unspecified: Secondary | ICD-10-CM | POA: Diagnosis not present

## 2019-02-16 DIAGNOSIS — I2721 Secondary pulmonary arterial hypertension: Secondary | ICD-10-CM | POA: Diagnosis not present

## 2019-02-16 DIAGNOSIS — I2510 Atherosclerotic heart disease of native coronary artery without angina pectoris: Secondary | ICD-10-CM | POA: Diagnosis not present

## 2019-02-16 DIAGNOSIS — Z136 Encounter for screening for cardiovascular disorders: Secondary | ICD-10-CM | POA: Diagnosis not present

## 2019-02-16 DIAGNOSIS — Z0000 Encounter for general adult medical examination without abnormal findings: Secondary | ICD-10-CM | POA: Diagnosis not present

## 2019-02-16 MED ORDER — POTASSIUM CHLORIDE CRYS ER 20 MEQ PO TBCR: 20.0000 meq | EXTENDED_RELEASE_TABLET | Freq: Two times a day (BID) | ORAL | 3 refills | Status: DC

## 2019-02-16 MED ORDER — FUROSEMIDE 40 MG PO TABS: ORAL_TABLET | ORAL | 3 refills | Status: DC

## 2019-02-16 NOTE — Patient Instructions (Signed)
INCREASE Lasix to 80 mg, twice a day.  INCREASE Potassium to 20 mEq twice a day.  Please have labs completed in 10 days.  Your physician recommends that you schedule a follow-up appointment in: 1 MONTH.  PLEASE CALL TO GET YOUR CPAP MACHINE.  At the [facility], you and your health needs are our priority. As part of our continuing mission to provide you with exceptional heart care, we have created designated Provider Care Teams. These Care Teams include your primary Cardiologist (physician) and Advanced Practice Providers (APPs- Physician Assistants and Nurse Practitioners) who all work together to provide you with the care you need, when you need it.   You may see any of the following providers on your designated Care Team at your next follow up: Marland Kitchen Dr Glori Bickers . Dr Loralie Champagne . Darrick Grinder, NP

## 2019-02-16 NOTE — Progress Notes (Signed)
PCP: Dr. Scotty Court Cardiology: Dr. Domenic Polite HF Cardiology: Dr. Aundra Dubin  78 y.o. with history of prior DVT, CAD, HTN was referred by Dr. Domenic Polite for evaluation of pulmonary hypertension/RV failure.  Patient was a long-time smoker, quit in 3/19. In 1/19, she began to develop peripheral edema.  This gradually worsened and Lasix was started.   In 3/19, I took her for right/left heart cath.  This showed 70% in-stent restenosis in the RCA, medically managed.  She had normal filling pressures on RHC with moderate pulmonary hypertension.  V/Q scan showed no chronic PE and PFTs showed moderate to severe obstructive defect.   She saw Dr. Lake Bells with pulmonary, and was diagnosed with both COPD and idiopathic pulmonary fibrosis.  She was started on Ofev.    She returns for followup of CHF and pulmonary hypertension.  She has home oxygen.  She needs to be on CPAP but has to get it renewed. Weight is actually down about 16 lbs since last appointment.  Her legs fatigue and hurt "all over," not worse with ambulation, no pedal ulcers.  She is easily fatigued, especially with housework. She is short of breath after walking about 100 yards.  No orthopnea/PND.  No chest pain.   Labs (1/19): K 3.8, creatinine 0.74 Labs (3/19): K 4.6, creatinine 0.87 Labs (5/19): LDL 52 Labs (7/19): K 4.3, creatinine 0.82 Labs (11/19): LDL 55, TGs 73 Labs (12/19): K 4.1, creatinine 0.87 => 0.99 Labs (6/20): K 3.7, creatinine 0.97  PMH: 1. CAD: BMS to RCA in 2004.  - Cardiolite (12/16): EF 52%, fixed inferior defect with no ischemia.  - LHC (3/19): 70% in-stent restenosis in RCA stent, managed medically.  2. Depression 3. H/o DVT 4. HTN 5. Osteoarthritis: h/o R TKR.  6. H/o melanoma 7. Crohns disease 8. Hyperlipidemia 9. Carotid stenosis: Followed at [facility].  - Carotid dopplers (11/18): RICA 81-15% stenosis, LICA 72-62% stenosis.  - Carotid dopplers (03/55): RICA 97-41%, LICA 63-84%.  - Carotid dopplers (6/20): RICA 53-64%  stenosis, LICA 68-03% stenosis.  10. COPD: No longer smokes. -  PFTs (3/19): moderate-severe obstruction consistent with COPD. 11. Pulmonary hypertension/RV failure: Suspect primarily group 3.  Echo (1/19) with EF 55-60%, mild LVH, mild MR, moderate RV dilation with moderately decreased systolic function, moderate TR, PASP 74 mmHg.  - CTA chest (1/19): No PE, chronic fibrotic changes in the lungs, 6 mm RUL nodule.  - RHC (3/19): mean RA 2, PA 54/16 mean 28, mean PCWP 8, CI 2.55, PVR 3.9 WU.  - PFTs (3/19): moderate-severe obstruction consistent with COPD.  - V/Q scan (3/19): No evidence for chronic PE.  - Echo (3/20): EF 55-60%, moderately enlarged RV with moderately decreased RV systolic function, PASP 87 mmHg, moderate TR, dilated IVC.  12. TIA  13. PAD: Peripheral arterial dopplers (3/19) with totally occluded right AT, totally occluded left SFA, severe stenosis in left PT.  - ABIs (12/19): 0.73 on left, 1.05 on right.  - ABIs (6/20): Stable compared to prior.  14. Idiopathic pulmonary fibrosis: Followed by Dr. Lake Bells, she is on Ofev.  15. LBBB 16. Multinodular goiter 17. OSA  Social History   Socioeconomic History  . Marital status: Divorced    Spouse name: Not on file  . Number of children: 2  . Years of education: Not on file  . Highest education level: Not on file  Occupational History  . Occupation: Retired    Fish farm manager: RETIRED  Social Needs  . Financial resource strain: Not on file  . Food  insecurity    Worry: Not on file    Inability: Not on file  . Transportation needs    Medical: Not on file    Non-medical: Not on file  Tobacco Use  . Smoking status: Former Smoker    Packs/day: 1.00    Years: 40.00    Pack years: 40.00    Types: Cigarettes    Start date: 06/02/1957    Quit date: 09/2017    Years since quitting: 1.3  . Smokeless tobacco: Never Used  Substance and Sexual Activity  . Alcohol use: No    Alcohol/week: 0.0 standard drinks  . Drug use: No  .  Sexual activity: Not on file  Lifestyle  . Physical activity    Days per week: Not on file    Minutes per session: Not on file  . Stress: Not on file  Relationships  . Social Herbalist on phone: Not on file    Gets together: Not on file    Attends religious service: Not on file    Active member of club or organization: Not on file    Attends meetings of clubs or organizations: Not on file    Relationship status: Not on file  . Intimate partner violence    Fear of current or ex partner: Not on file    Emotionally abused: Not on file    Physically abused: Not on file    Forced sexual activity: Not on file  Other Topics Concern  . Not on file  Social History Narrative  . Not on file   Family History  Problem Relation Age of Onset  . Diabetes type II Mother   . Heart attack Mother 50       questionable  . Uterine cancer Mother   . Heart disease Mother        before age 25  . Varicose Veins Mother   . Other Brother        PVD and hx DVT  . Deep vein thrombosis Brother   . Breast cancer Sister   . Hyperlipidemia Sister   . Hypertension Sister   . Colon cancer Neg Hx    ROS: All systems reviewed and negative except as per HPI.  Current Outpatient Medications  Medication Sig Dispense Refill  . aspirin 81 MG tablet Take 81 mg by mouth daily.      . calcium citrate-vitamin D (CITRACAL+D) 315-200 MG-UNIT tablet Take 1 tablet by mouth daily.     . Cyanocobalamin (B-12 PO) Take 1 tablet by mouth See admin instructions. Take 1 capsule every day x 7 days every other week     . ferrous sulfate 325 (65 FE) MG tablet Take 325 mg by mouth daily.    Marland Kitchen FLUoxetine (PROZAC) 40 MG capsule Take 40 mg by mouth daily.    . folic acid (FOLVITE) 1 MG tablet TAKE 1 TABLET BY MOUTH EVERY DAY 90 tablet 4  . furosemide (LASIX) 40 MG tablet Take 2 tablets (80 mg total) by mouth every morning AND 2 tablets (80 mg total) every evening. 360 tablet 3  . HYDROcodone-acetaminophen (NORCO)  10-325 MG tablet Take 1 tablet by mouth every 6 (six) hours as needed for moderate pain.     . metoprolol succinate (TOPROL-XL) 25 MG 24 hr tablet TAKE 1 TABLET BY MOUTH EVERY DAY 90 tablet 2  . Misc Natural Products (OSTEO BI-FLEX JOINT SHIELD PO) Take 1 capsule by mouth daily.    . Nintedanib (  OFEV) 100 MG CAPS Take 1 capsule (100 mg total) by mouth 2 (two) times daily. 60 capsule 11  . nitroGLYCERIN (NITROSTAT) 0.4 MG SL tablet Place 1 tablet (0.4 mg total) under the tongue every 5 (five) minutes x 3 doses as needed. 25 tablet 3  . Omega-3 Fatty Acids (FISH OIL) 1200 MG CAPS Take 1,200 mg by mouth daily.     . potassium chloride SA (K-DUR) 20 MEQ tablet Take 1 tablet (20 mEq total) by mouth 2 (two) times daily. 180 tablet 3  . rosuvastatin (CRESTOR) 10 MG tablet Take 1 tablet (10 mg total) by mouth every other day.    . spironolactone (ALDACTONE) 25 MG tablet Take 1 tablet (25 mg total) by mouth daily. 90 tablet 3  . sulfaSALAzine (AZULFIDINE) 500 MG tablet Take 1 tablet (500 mg total) by mouth 2 (two) times daily. 60 tablet 6  . XARELTO 2.5 MG TABS tablet TAKE 1 TABLET BY MOUTH TWICE DAILY 60 tablet 6   No current facility-administered medications for this encounter.    BP 134/80   Pulse (!) 53   Wt 91.9 kg (202 lb 9.6 oz)   SpO2 96%   BMI 33.71 kg/m  General: NAD Neck: JVP 8-9 cm with HJR, no thyromegaly or thyroid nodule.  Lungs: Dry crackles at bases.  CV: Nondisplaced PMI.  Heart regular S1/S2, no S3/S4, no murmur. 1+ ankle edema.  No carotid bruit.  Unable to palpate pedal pulses.  Abdomen: Soft, nontender, no hepatosplenomegaly, no distention.  Skin: Intact without lesions or rashes.  Neurologic: Alert and oriented x 3.  Psych: Normal affect. Extremities: No clubbing or cyanosis.  HEENT: Normal.   Assessment/Plan: 1. Chronic diastolic CHF with prominent RV failure: Moderately dilated RV with moderately decreased systolic function on 1/75 echo.   [place] in 3/19 showed normal  right and left heart filling pressures.   Chronic NYHA class III symptoms.  Suspect this is from a combination of CHF, COPD and IPF.  Though weight has trended down, she looks volume overloaded on exam. - Increase Lasix to 80 mg bid.  Increase KCl to 20 bid. BMET in 10 days.  - Continue spironolactone 25 mg daily.   2. Pulmonary hypertension: PASP 74 mmHg on 1/19 echo with appearance of cor pulmonale.  CTA chest in 1/19 showed chronic fibrotic changes in the lungs but not emphysema, IPF diagnosed by Dr. Lake Bells.  However, PFTs in 3/19 were suggestive of moderate-severe obstruction consistent with COPD.  RHC (3/19) showed moderate pulmonary arterial hypertension.  V/Q scan showed no evidence for chronic pulmonary embolus.  My suspicion is that her pulmonary hypertension is primarily group 3 due to COPD and idiopathic pulmonary fibrosis.  She has OSA as well.  - I am not inclined to treat with pulmonary vasodilators, Dr Lake Bells has agreed with this.   - Use home oxygen.  - She needs to restart CPAP, she will call to get this started.  3. Pulmonary fibrosis: IPF.  She is now on Ofev.  She does not see much of a difference. 4. CAD: h/o BMS to RCA in 2004.  LHC in 3/19 showed 70% in-stent restenosis in the RCA.  With no chest pain, plan to treat medically.  - Continue ASA 81.  - She has extensive vascular disease.  Based on COMPASS study, I have her on rivaroxaban 2.5 mg bid.  - Continue Crestor, good lipids in 11/19.  5. Carotid stenosis: Followed by [facility]. 6. PAD: Peripheral arterial dopplers (3/19) showed  occluded left SFA.  She has leg pain but it is not clearly claudication.   - Sees Dr. Oneida Alar, continue conservative management.  - She has quit smoking.  7. COPD: She has now quit smoking.   Followup 1 month.    Loralie Champagne 02/16/2019

## 2019-02-17 DIAGNOSIS — R2689 Other abnormalities of gait and mobility: Secondary | ICD-10-CM | POA: Diagnosis not present

## 2019-02-17 DIAGNOSIS — R062 Wheezing: Secondary | ICD-10-CM | POA: Diagnosis not present

## 2019-02-17 DIAGNOSIS — I5032 Chronic diastolic (congestive) heart failure: Secondary | ICD-10-CM | POA: Diagnosis not present

## 2019-02-26 ENCOUNTER — Telehealth: Payer: Self-pay | Admitting: Cardiology

## 2019-02-26 NOTE — Telephone Encounter (Signed)
Received outpatient call regarding patient lab work. Went to [facility] to have labs drawn earlier today and was told that there would be a 2 hour wait and the patient left. Her lasix was increased on 02/16/2019 by Dr. Aundra Dubin from 52m in AM and 655min PM to 80 amd 80. Based on her previous lab work with stable renal function and K+ levels, she was reassured that she would be able to continue this regimen until Monday morning when she returns to have her lab work drawn. She is stable without SOB or other complaints.  Pt understands and agrees.    JiKathyrn DrownP-C HeOak Ridgeager: [phone]

## 2019-02-27 DIAGNOSIS — I5032 Chronic diastolic (congestive) heart failure: Secondary | ICD-10-CM | POA: Diagnosis not present

## 2019-02-27 DIAGNOSIS — R2689 Other abnormalities of gait and mobility: Secondary | ICD-10-CM | POA: Diagnosis not present

## 2019-02-27 DIAGNOSIS — J449 Chronic obstructive pulmonary disease, unspecified: Secondary | ICD-10-CM | POA: Diagnosis not present

## 2019-02-27 DIAGNOSIS — R062 Wheezing: Secondary | ICD-10-CM | POA: Diagnosis not present

## 2019-03-01 ENCOUNTER — Other Ambulatory Visit (HOSPITAL_COMMUNITY)
Admission: RE | Admit: 2019-03-01 | Discharge: 2019-03-01 | Disposition: A | Discharge status: Home / Self Care | Payer: PPO | Source: Ambulatory Visit | Attending: Cardiology | Admitting: Cardiology

## 2019-03-01 DIAGNOSIS — I6523 Occlusion and stenosis of bilateral carotid arteries: Secondary | ICD-10-CM | POA: Diagnosis not present

## 2019-03-01 LAB — BASIC METABOLIC PANEL
Anion gap: 11 (ref 5–15)
BUN: 26 mg/dL — ABNORMAL HIGH (ref 8–23)
CO2: 25 mmol/L (ref 22–32)
Calcium: 9.8 mg/dL (ref 8.9–10.3)
Chloride: 102 mmol/L (ref 98–111)
Creatinine, Ser: 0.84 mg/dL (ref 0.44–1.00)
GFR calc Af Amer: >60 mL/min (ref >60)
GFR calc non Af Amer: >60 mL/min (ref >60)
Glucose, Bld: 119 mg/dL — ABNORMAL HIGH (ref 70–99)
Potassium: 3.7 mmol/L (ref 3.5–5.1)
Sodium: 138 mmol/L (ref 135–145)

## 2019-03-15 ENCOUNTER — Telehealth: Payer: Self-pay | Admitting: Pulmonary Disease

## 2019-03-15 NOTE — Telephone Encounter (Signed)
Called and spoke with pt. Pt stated she received a letter from insurance stating that she was denied O2 due to no authorization on file. Pt does wear O2 at 4L during the day and wears about 2L at night.  Asked pt if she has been requalified for O2 recently and she stated that she had not. I stated to her that it seems like she needs to be requalified for the O2 and she expressed understanding. appt has been scheduled for pt with [place] tomorrow and I also told pt to bring the letter with her to show provider and we could get all taken care of for her. Pt verbalized understanding. Nothing further needed.

## 2019-03-16 ENCOUNTER — Ambulatory Visit: Payer: PPO | Admitting: Nurse Practitioner

## 2019-03-16 ENCOUNTER — Other Ambulatory Visit: Payer: Self-pay

## 2019-03-16 ENCOUNTER — Encounter: Payer: Self-pay | Admitting: Nurse Practitioner

## 2019-03-16 VITALS — BP 130/70 | HR 56 | Temp 98.4°F | Ht 64.0 in | Wt 199.6 lb

## 2019-03-16 DIAGNOSIS — Z5181 Encounter for therapeutic drug level monitoring: Secondary | ICD-10-CM | POA: Diagnosis not present

## 2019-03-16 DIAGNOSIS — J84112 Idiopathic pulmonary fibrosis: Secondary | ICD-10-CM

## 2019-03-16 LAB — HEPATIC FUNCTION PANEL
ALT: 13 U/L (ref 0–35)
AST: 17 U/L (ref 0–37)
Albumin: 4 g/dL (ref 3.5–5.2)
Alkaline Phosphatase: 71 U/L (ref 39–117)
Bilirubin, Direct: 0.2 mg/dL (ref 0.0–0.3)
Total Bilirubin: 0.8 mg/dL (ref 0.2–1.2)
Total Protein: 6.7 g/dL (ref 6.0–8.3)

## 2019-03-16 NOTE — Progress Notes (Signed)
_0  ID: Shelley [name], female    DOB: 1941/02/21, 78 y.o.   MRN: [id number]  Chief Complaint  Patient presents with  . Follow-up    requalified for O2, DME [employer], has sob with exertion, denies cough, using cpap, mask bothers her face    Referring provider: Sandi Mealy, MD  HPI Patient is a 78 year old female with a past medical history significant for ILD, OSA, IPF, COPD, previous hypertension, diastolic heart failure. Patient is followed by Dr. Lake Bells.  Synopsis: Referred in March 2019 by Dr. Aundra Dubin for COPD and a CT chest showing UIP.  She has pulmonary hypertension and CAD.  She had cardiac stents placed in 2004 by Dr. Lia Foyer.  She developed leg swelling in January 2019 and was found to have pulmonary hypertension.  She also has a history of Crohn's disease. 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  She quit smoking in 2019.  Ofev started 12/2017.   Tests:  Chest imaging: CTA chest (1/19): No PE, air trapping noted, some centrilobular emphysema noted, some paraseptal emphysema noted, fibrotic change in the periphery and appears to be worse in the bases V/Q scan (3/19): No evidence for chronic PE. HRCT 10/2017: peripheral based recitulation, interlobular thickening and honeycombing worse in the bases, some traction bronchiectasis, some areas of preserved lung noted. Per radiology probably UIP, possible fibrotic NSIP; by [facility] read this is consistent with UIP; paratracheal adenopathy, thyroid nodule 2 cm, cardiomegally  PFT: - PFTs (3/19): Ratio 63%, FVC 1.86L FEV1 1.28 L 61% predicted, total lung capacity 4.2 L 80% predicted, DLCO 8.10 31% predicted December 2019: FVC 1.75 L 62% predicted, DLCO 6.9 mL 26% predicted  Labs: 10/2017: ANA neg, SCL 70 neg, Anti-Jo-1 neg, Centromere neg, SSA/SSB neg, RF neg, HP panel neg  Echo: Echo (1/19) with EF 55-60%, mild LVH, mild MR, moderate RV dilation with moderately decreased systolic function, moderate TR, PASP 74 mmHg.    Echo 11/03/18 - The left ventricle has normal systolic function, with an ejection fraction of 55-60%. The cavity size was normal. Left ventricular diastolic Doppler parameters are consistent with impaired relaxation. Elevated mean left atrial pressure.  2. The right ventricle has moderately reduced systolic function. The cavity was moderately enlarged. There is no increase in right ventricular wall thickness. Right ventricular systolic pressure is severely elevated with an estimated pressure of 86.6  mmHg.  3. Lateral annulus peak S velocity 8.7 cm/s. TAPSE 9.6 mm.  4. Right atrial size was moderately dilated.  5. Tricuspid valve regurgitation is moderate.  6. The aortic valve is tricuspid Moderate thickening of the aortic valve Moderate calcification of the aortic valve.  7. The inferior vena cava was dilated in size with <50% respiratory variability.   Heart Catheterization: - RHC (3/19): mean RA 2, PA 54/16 mean 28, mean PCWP 8, CI 2.55, PVR 3.9 WU.  Sleep study: July 2019 home sleep test showed moderate obstructive sleep apnea with an AHI of 28, O2 saturation nadir 75%, 700 minutes below 89    OV 03/16/19 - Follow up Patient presents today for follow-up visit.  She states that this is been a stable interval for her.  She was last seen by Dr. Lake Bells on 11/03/2018.  Patient continues on O2 at 2 L at night and 4 L during the day with exertion.  She does need a requalifying walk today per insurance.  Patient does continue on CPAP and states that she is compliant with CPAP and wears it nightly.  We were  unable to get a download today.  We will contact DME company to try to get a download.  Patient continues on Ofev and is tolerating well.  Patient has followed up with cardiology and is taking Lasix as directed.  She denies any significant shortness of breath.  She denies any significant cough.  Denies f/c/s, n/v/d, hemoptysis, PND, leg swelling.     Patient was waked in office today her sats  dropped to 87% while ambulating when placed back on 4L POC her sats returned to above 91%  Allergies  Allergen Reactions  . Doxycycline Other (See Comments)    Poss. Photosensitivity  . Mercaptopurine Other (See Comments)    REACTION: pancreatitis  . Simvastatin Other (See Comments)    Severe leg aches  . Sulfasalazine   . Penicillins Rash and Other (See Comments)    Has patient had a PCN reaction causing immediate rash, facial/tongue/throat swelling, SOB or lightheadedness with hypotension: No Has patient had a PCN reaction causing severe rash involving mucus membranes or skin necrosis: No Has patient had a PCN reaction that required hospitalization: No Has patient had a PCN reaction occurring within the last 10 years: No If all of the above answers are "NO", then may proceed with Cephalosporin use.   . Sulfa Antibiotics Rash    Immunization History  Administered Date(s) Administered  . Influenza,inj,Quad PF,6+ Mos 06/16/2017, 05/25/2018  . Influenza-Unspecified 06/14/2009, 06/09/2012, 05/28/2013, 05/11/2014, 06/15/2015  . Pneumococcal Conjugate-13 05/11/2014  . Pneumococcal Polysaccharide-23 04/02/2018  . Tdap 06/09/2012  . Zoster 06/09/2012    Past Medical History:  Diagnosis Date  . Anxiety   . Carotid artery disease ([place])    98-11% RICA and LICA 9/14 - Dr. Oneida Alar  . Cataract   . COPD (chronic obstructive pulmonary disease) ([place])   . Coronary atherosclerosis of native coronary artery    BMS to RCA 2004  . Depression   . DVT (deep venous thrombosis) ([place])   . Essential hypertension, benign   . Fracture Left Great Toe  . Hepatic steatosis   . History of melanoma   . History of oral aphthous ulcers   . History of Salmonella gastroenteritis   . Hx of adenomatous colonic polyps   . Mixed hyperlipidemia   . Myocardial infarction ([place])    IMI 10/04  . Osteoarthritis   . Osteoporosis   . Pancreatitis   . Rectal bleeding   . Regional enteritis of large intestine  ([place])   . Sinus polyp   . TIA (transient ischemic attack)   . Tubular adenoma of colon 08/2012    Tobacco History: Social History   Tobacco Use  Smoking Status Former Smoker  . Packs/day: 1.00  . Years: 40.00  . Pack years: 40.00  . Types: Cigarettes  . Start date: 06/02/1957  . Quit date: 09/2017  . Years since quitting: 1.4  Smokeless Tobacco Never Used   Counseling given: Not Answered   Outpatient Encounter Medications as of 03/16/2019  Medication Sig  . aspirin 81 MG tablet Take 81 mg by mouth daily.    . calcium citrate-vitamin D (CITRACAL+D) 315-200 MG-UNIT tablet Take 1 tablet by mouth daily.   . ferrous sulfate 325 (65 FE) MG tablet Take 325 mg by mouth daily.  Marland Kitchen FLUoxetine (PROZAC) 40 MG capsule Take 40 mg by mouth daily.  . folic acid (FOLVITE) 1 MG tablet TAKE 1 TABLET BY MOUTH EVERY DAY  . furosemide (LASIX) 40 MG tablet Take 2 tablets (80 mg total) by mouth  every morning AND 2 tablets (80 mg total) every evening.  Marland Kitchen HYDROcodone-acetaminophen (NORCO) 10-325 MG tablet Take 1 tablet by mouth every 6 (six) hours as needed for moderate pain.   . metoprolol succinate (TOPROL-XL) 25 MG 24 hr tablet TAKE 1 TABLET BY MOUTH EVERY DAY  . Misc Natural Products (OSTEO BI-FLEX JOINT SHIELD PO) Take 1 capsule by mouth daily.  . Nintedanib (OFEV) 100 MG CAPS Take 1 capsule (100 mg total) by mouth 2 (two) times daily.  . nitroGLYCERIN (NITROSTAT) 0.4 MG SL tablet Place 1 tablet (0.4 mg total) under the tongue every 5 (five) minutes x 3 doses as needed.  . Omega-3 Fatty Acids (FISH OIL) 1200 MG CAPS Take 1,200 mg by mouth daily.   . potassium chloride SA (K-DUR) 20 MEQ tablet Take 1 tablet (20 mEq total) by mouth 2 (two) times daily.  Marland Kitchen spironolactone (ALDACTONE) 25 MG tablet Take 1 tablet (25 mg total) by mouth daily.  Marland Kitchen sulfaSALAzine (AZULFIDINE) 500 MG tablet Take 1 tablet (500 mg total) by mouth 2 (two) times daily.  Alveda Reasons 2.5 MG TABS tablet TAKE 1 TABLET BY MOUTH TWICE DAILY   . rosuvastatin (CRESTOR) 10 MG tablet Take 1 tablet (10 mg total) by mouth every other day. (Patient not taking: Reported on 03/16/2019)  . [DISCONTINUED] Cyanocobalamin (B-12 PO) Take 1 tablet by mouth See admin instructions. Take 1 capsule every day x 7 days every other week    No facility-administered encounter medications on file as of 03/16/2019.      Review of Systems  Review of Systems  Constitutional: Negative.  Negative for chills and fever.  HENT: Negative.   Respiratory: Positive for shortness of breath (with exertion). Negative for cough and wheezing.   Cardiovascular: Negative.  Negative for chest pain, palpitations and leg swelling.  Gastrointestinal: Negative.   Allergic/Immunologic: Negative.   Neurological: Negative.   Psychiatric/Behavioral: Negative.        Physical Exam  BP 130/70 (BP Location: Left Arm, Cuff Size: Normal)   Pulse (!) 56   Temp 98.4 F (36.9 C) (Oral)   Ht _0  (1.626 m)   Wt 199 lb 9.6 oz (90.5 kg)   SpO2 91%   BMI 34.26 kg/m   Wt Readings from Last 5 Encounters:  03/16/19 199 lb 9.6 oz (90.5 kg)  02/16/19 202 lb 9.6 oz (91.9 kg)  02/04/19 205 lb (93 kg)  01/25/19 205 lb (93 kg)  11/03/18 218 lb 6 oz (99.1 kg)     Physical Exam Vitals signs and nursing note reviewed.  Constitutional:      General: She is not in acute distress.    Appearance: She is well-developed.  Cardiovascular:     Rate and Rhythm: Normal rate and regular rhythm.  Pulmonary:     Effort: Pulmonary effort is normal. No respiratory distress.     Breath sounds: Normal breath sounds. No wheezing or rhonchi.  Musculoskeletal:        General: No swelling.  Neurological:     Mental Status: She is alert and oriented to person, place, and time.       Assessment & Plan:   IPF (idiopathic pulmonary fibrosis) ([place]) Patient presents today for follow-up visit.  She states that this is been a stable interval for her.  She was last seen by Dr. Lake Bells on  11/03/2018.  Patient continues on O2 at 2 L at night and 4 L during the day with exertion.  She does need a requalifying  walk today per insurance.  Patient does continue on CPAP and states that she is compliant with CPAP and wears it nightly.  We were unable to get a download today.  We will contact DME company to try to get a download.  Patient continues on Ofev and is tolerating well.  Patient has followed up with cardiology and is taking Lasix as directed.   Patient Instructions  Idiopathic pulmonary fibrosis: Continue Ofev Liver function testing today to monitor for drug toxicity Repeat pulmonary function testing at next visit  Chronic respiratory failure with hypoxemia: Keep using oxygen as already using to keep sats above 88%  COPD: Continue albuterol every 6 hours  Pulmonary hypertension: Continue Lasix as directed by the cardiology clinic  Obstructive sleep apnea: Patient continues to benefit from CPAP with good compliance - will request download from [employer] Continue CPAP at current settings Continue current medications Goal of 4 hours or more usage per night Maintain healthy weight Do not drive if drowsy  Follow up: Follow up with Dr. Lake Bells in 3 months or sooner if needed       Fenton Foy, NP 03/16/2019

## 2019-03-16 NOTE — Assessment & Plan Note (Signed)
Patient presents today for follow-up visit.  She states that this is been a stable interval for her.  She was last seen by Dr. Lake Bells on 11/03/2018.  Patient continues on O2 at 2 L at night and 4 L during the day with exertion.  She does need a requalifying walk today per insurance.  Patient does continue on CPAP and states that she is compliant with CPAP and wears it nightly.  We were unable to get a download today.  We will contact DME company to try to get a download.  Patient continues on Ofev and is tolerating well.  Patient has followed up with cardiology and is taking Lasix as directed.   Patient Instructions  Idiopathic pulmonary fibrosis: Continue Ofev Liver function testing today to monitor for drug toxicity Repeat pulmonary function testing at next visit  Chronic respiratory failure with hypoxemia: Keep using oxygen as already using to keep sats above 88%  COPD: Continue albuterol every 6 hours  Pulmonary hypertension: Continue Lasix as directed by the cardiology clinic  Obstructive sleep apnea: Patient continues to benefit from CPAP with good compliance - will request download from [employer] Continue CPAP at current settings Continue current medications Goal of 4 hours or more usage per night Maintain healthy weight Do not drive if drowsy  Follow up: Follow up with Dr. Lake Bells in 3 months or sooner if needed

## 2019-03-16 NOTE — Patient Instructions (Signed)
Idiopathic pulmonary fibrosis: Continue Ofev Liver function testing today to monitor for drug toxicity Repeat pulmonary function testing at next visit  Chronic respiratory failure with hypoxemia: Keep using oxygen as already using to keep sats above 88%  COPD: Continue albuterol every 6 hours  Pulmonary hypertension: Continue Lasix as directed by the cardiology clinic  Obstructive sleep apnea: Patient continues to benefit from CPAP with good compliance - will request download from [employer] Continue CPAP at current settings Continue current medications Goal of 4 hours or more usage per night Maintain healthy weight Do not drive if drowsy  Follow up: Follow up with Dr. Lake Bells in 3 months or sooner if needed

## 2019-03-18 NOTE — Progress Notes (Signed)
Reviewed, agree 

## 2019-03-19 DIAGNOSIS — R2689 Other abnormalities of gait and mobility: Secondary | ICD-10-CM | POA: Diagnosis not present

## 2019-03-19 DIAGNOSIS — R062 Wheezing: Secondary | ICD-10-CM | POA: Diagnosis not present

## 2019-03-19 DIAGNOSIS — I5032 Chronic diastolic (congestive) heart failure: Secondary | ICD-10-CM | POA: Diagnosis not present

## 2019-03-30 DIAGNOSIS — R062 Wheezing: Secondary | ICD-10-CM | POA: Diagnosis not present

## 2019-03-30 DIAGNOSIS — R2689 Other abnormalities of gait and mobility: Secondary | ICD-10-CM | POA: Diagnosis not present

## 2019-03-30 DIAGNOSIS — J449 Chronic obstructive pulmonary disease, unspecified: Secondary | ICD-10-CM | POA: Diagnosis not present

## 2019-03-30 DIAGNOSIS — I5032 Chronic diastolic (congestive) heart failure: Secondary | ICD-10-CM | POA: Diagnosis not present

## 2019-04-05 ENCOUNTER — Ambulatory Visit (HOSPITAL_COMMUNITY)
Admission: RE | Admit: 2019-04-05 | Discharge: 2019-04-05 | Disposition: A | Discharge status: Home / Self Care | Payer: PPO | Source: Ambulatory Visit | Attending: Cardiology | Admitting: Cardiology

## 2019-04-05 ENCOUNTER — Encounter (HOSPITAL_COMMUNITY): Payer: Self-pay | Admitting: Cardiology

## 2019-04-05 ENCOUNTER — Other Ambulatory Visit: Payer: Self-pay

## 2019-04-05 VITALS — BP 122/70 | HR 51 | Wt 197.2 lb

## 2019-04-05 DIAGNOSIS — F329 Major depressive disorder, single episode, unspecified: Secondary | ICD-10-CM | POA: Diagnosis not present

## 2019-04-05 DIAGNOSIS — J84112 Idiopathic pulmonary fibrosis: Secondary | ICD-10-CM | POA: Diagnosis not present

## 2019-04-05 DIAGNOSIS — Z7982 Long term (current) use of aspirin: Secondary | ICD-10-CM | POA: Diagnosis not present

## 2019-04-05 DIAGNOSIS — I2721 Secondary pulmonary arterial hypertension: Secondary | ICD-10-CM

## 2019-04-05 DIAGNOSIS — I5032 Chronic diastolic (congestive) heart failure: Secondary | ICD-10-CM | POA: Diagnosis not present

## 2019-04-05 DIAGNOSIS — I2510 Atherosclerotic heart disease of native coronary artery without angina pectoris: Secondary | ICD-10-CM | POA: Diagnosis not present

## 2019-04-05 DIAGNOSIS — K5090 Crohn's disease, unspecified, without complications: Secondary | ICD-10-CM | POA: Insufficient documentation

## 2019-04-05 DIAGNOSIS — G4733 Obstructive sleep apnea (adult) (pediatric): Secondary | ICD-10-CM | POA: Insufficient documentation

## 2019-04-05 DIAGNOSIS — E785 Hyperlipidemia, unspecified: Secondary | ICD-10-CM | POA: Diagnosis not present

## 2019-04-05 DIAGNOSIS — J449 Chronic obstructive pulmonary disease, unspecified: Secondary | ICD-10-CM | POA: Insufficient documentation

## 2019-04-05 DIAGNOSIS — Z8673 Personal history of transient ischemic attack (TIA), and cerebral infarction without residual deficits: Secondary | ICD-10-CM | POA: Insufficient documentation

## 2019-04-05 DIAGNOSIS — Z86718 Personal history of other venous thrombosis and embolism: Secondary | ICD-10-CM | POA: Diagnosis not present

## 2019-04-05 DIAGNOSIS — Z7901 Long term (current) use of anticoagulants: Secondary | ICD-10-CM | POA: Insufficient documentation

## 2019-04-05 DIAGNOSIS — Z79899 Other long term (current) drug therapy: Secondary | ICD-10-CM | POA: Diagnosis not present

## 2019-04-05 DIAGNOSIS — Z87891 Personal history of nicotine dependence: Secondary | ICD-10-CM | POA: Insufficient documentation

## 2019-04-05 DIAGNOSIS — I110 Hypertensive heart disease with heart failure: Secondary | ICD-10-CM | POA: Diagnosis not present

## 2019-04-05 DIAGNOSIS — Z8249 Family history of ischemic heart disease and other diseases of the circulatory system: Secondary | ICD-10-CM | POA: Diagnosis not present

## 2019-04-05 DIAGNOSIS — I739 Peripheral vascular disease, unspecified: Secondary | ICD-10-CM | POA: Diagnosis not present

## 2019-04-05 LAB — CBC
HCT: 38.7 % (ref 36.0–46.0)
Hemoglobin: 12.3 g/dL (ref 12.0–15.0)
MCH: 33.5 pg (ref 26.0–34.0)
MCHC: 31.8 g/dL (ref 30.0–36.0)
MCV: 105.4 fL — ABNORMAL HIGH (ref 80.0–100.0)
Platelets: 160 10*3/uL (ref 150–400)
RBC: 3.67 MIL/uL — ABNORMAL LOW (ref 3.87–5.11)
RDW: 13.4 % (ref 11.5–15.5)
WBC: 5.6 10*3/uL (ref 4.0–10.5)
nRBC: 0 % (ref 0.0–0.2)

## 2019-04-05 MED ORDER — FUROSEMIDE 40 MG PO TABS: ORAL_TABLET | ORAL | 3 refills | Status: DC

## 2019-04-05 MED ORDER — ROSUVASTATIN CALCIUM 10 MG PO TABS: 10.0000 mg | ORAL_TABLET | Freq: Every day | ORAL | 3 refills | Status: DC

## 2019-04-05 NOTE — Progress Notes (Signed)
PCP: Dr. Scotty Court Cardiology: Dr. Domenic Polite HF Cardiology: Dr. Aundra Dubin  78 y.o. with history of prior DVT, CAD, HTN was referred by Dr. Domenic Polite for evaluation of pulmonary hypertension/RV failure.  Patient was a long-time smoker, quit in 3/19. In 1/19, she began to develop peripheral edema.  This gradually worsened and Lasix was started.   In 3/19, I took her for right/left heart cath.  This showed 70% in-stent restenosis in the RCA, medically managed.  She had normal filling pressures on RHC with moderate pulmonary hypertension.  V/Q scan showed no chronic PE and PFTs showed moderate to severe obstructive defect.   She saw Dr. Lake Bells with pulmonary, and was diagnosed with both COPD and idiopathic pulmonary fibrosis.  She was started on Ofev.    She returns for followup of CHF and pulmonary hypertension.  She has home oxygen.  She is now using CPAP.  She stopped Crestor due to leg pain but being off Crestor did not help her leg pain.  She continues to have pain in her knees and feet bilaterally both at rest and with ambulation. No dyspnea walking around house and on flat ground in general as long as she has oxygen. Dyspnea with stairs.  No chest pain.  No palpitations, no orthopnea/PND.  Weight is down 5 lbs.   Labs (1/19): K 3.8, creatinine 0.74 Labs (3/19): K 4.6, creatinine 0.87 Labs (5/19): LDL 52 Labs (7/19): K 4.3, creatinine 0.82 Labs (11/19): LDL 55, TGs 73 Labs (12/19): K 4.1, creatinine 0.87 => 0.99 Labs (6/20): K 3.7, creatinine 0.97 Labs (7/20): K 3.7, creatinine 0.84  PMH: 1. CAD: BMS to RCA in 2004.  - Cardiolite (12/16): EF 52%, fixed inferior defect with no ischemia.  - LHC (3/19): 70% in-stent restenosis in RCA stent, managed medically.  2. Depression 3. H/o DVT 4. HTN 5. Osteoarthritis: h/o R TKR.  6. H/o melanoma 7. Crohns disease 8. Hyperlipidemia 9. Carotid stenosis: Followed at [facility].  - Carotid dopplers (11/18): RICA 67-89% stenosis, LICA 38-10% stenosis.  -  Carotid dopplers (17/51): RICA 02-58%, LICA 52-77%.  - Carotid dopplers (6/20): RICA 82-42% stenosis, LICA 35-36% stenosis.  10. COPD: No longer smokes. -  PFTs (3/19): moderate-severe obstruction consistent with COPD. 11. Pulmonary hypertension/RV failure: Suspect primarily group 3.  Echo (1/19) with EF 55-60%, mild LVH, mild MR, moderate RV dilation with moderately decreased systolic function, moderate TR, PASP 74 mmHg.  - CTA chest (1/19): No PE, chronic fibrotic changes in the lungs, 6 mm RUL nodule.  - RHC (3/19): mean RA 2, PA 54/16 mean 28, mean PCWP 8, CI 2.55, PVR 3.9 WU.  - PFTs (3/19): moderate-severe obstruction consistent with COPD.  - V/Q scan (3/19): No evidence for chronic PE.  - Echo (3/20): EF 55-60%, moderately enlarged RV with moderately decreased RV systolic function, PASP 87 mmHg, moderate TR, dilated IVC.  12. TIA  13. PAD: Peripheral arterial dopplers (3/19) with totally occluded right AT, totally occluded left SFA, severe stenosis in left PT.  - ABIs (12/19): 0.73 on left, 1.05 on right.  - ABIs (6/20): Stable compared to prior.  14. Idiopathic pulmonary fibrosis: Followed by Dr. Lake Bells, she is on Ofev.  15. LBBB 16. Multinodular goiter 17. OSA: CPAP.   Social History   Socioeconomic History  . Marital status: Divorced    Spouse name: Not on file  . Number of children: 2  . Years of education: Not on file  . Highest education level: Not on file  Occupational History  .  Occupation: Retired    Fish farm manager: RETIRED  Social Needs  . Financial resource strain: Not on file  . Food insecurity    Worry: Not on file    Inability: Not on file  . Transportation needs    Medical: Not on file    Non-medical: Not on file  Tobacco Use  . Smoking status: Former Smoker    Packs/day: 1.00    Years: 40.00    Pack years: 40.00    Types: Cigarettes    Start date: 06/02/1957    Quit date: 09/2017    Years since quitting: 1.5  . Smokeless tobacco: Never Used  Substance  and Sexual Activity  . Alcohol use: No    Alcohol/week: 0.0 standard drinks  . Drug use: No  . Sexual activity: Not on file  Lifestyle  . Physical activity    Days per week: Not on file    Minutes per session: Not on file  . Stress: Not on file  Relationships  . Social Herbalist on phone: Not on file    Gets together: Not on file    Attends religious service: Not on file    Active member of club or organization: Not on file    Attends meetings of clubs or organizations: Not on file    Relationship status: Not on file  . Intimate partner violence    Fear of current or ex partner: Not on file    Emotionally abused: Not on file    Physically abused: Not on file    Forced sexual activity: Not on file  Other Topics Concern  . Not on file  Social History Narrative  . Not on file   Family History  Problem Relation Age of Onset  . Diabetes type II Mother   . Heart attack Mother 9       questionable  . Uterine cancer Mother   . Heart disease Mother        before age 12  . Varicose Veins Mother   . Other Brother        PVD and hx DVT  . Deep vein thrombosis Brother   . Breast cancer Sister   . Hyperlipidemia Sister   . Hypertension Sister   . Colon cancer Neg Hx    ROS: All systems reviewed and negative except as per HPI.  Current Outpatient Medications  Medication Sig Dispense Refill  . aspirin 81 MG tablet Take 81 mg by mouth daily.      . calcium citrate-vitamin D (CITRACAL+D) 315-200 MG-UNIT tablet Take 1 tablet by mouth daily.     . ferrous sulfate 325 (65 FE) MG tablet Take 325 mg by mouth daily.    Marland Kitchen FLUoxetine (PROZAC) 40 MG capsule Take 40 mg by mouth daily.    . folic acid (FOLVITE) 1 MG tablet TAKE 1 TABLET BY MOUTH EVERY DAY 90 tablet 4  . furosemide (LASIX) 40 MG tablet Take 2 tabs Twice daily every other day ALTERNATING with 3 tabs in AM and 2 tabs in PM every other day 450 tablet 3  . HYDROcodone-acetaminophen (NORCO) 10-325 MG tablet Take 1  tablet by mouth every 6 (six) hours as needed for moderate pain.     . metoprolol succinate (TOPROL-XL) 25 MG 24 hr tablet TAKE 1 TABLET BY MOUTH EVERY DAY 90 tablet 2  . Misc Natural Products (OSTEO BI-FLEX JOINT SHIELD PO) Take 1 capsule by mouth daily.    . Nintedanib (OFEV) 100  MG CAPS Take 1 capsule (100 mg total) by mouth 2 (two) times daily. 60 capsule 11  . nitroGLYCERIN (NITROSTAT) 0.4 MG SL tablet Place 1 tablet (0.4 mg total) under the tongue every 5 (five) minutes x 3 doses as needed. 25 tablet 3  . Omega-3 Fatty Acids (FISH OIL) 1200 MG CAPS Take 1,200 mg by mouth daily.     . potassium chloride SA (K-DUR) 20 MEQ tablet Take 1 tablet (20 mEq total) by mouth 2 (two) times daily. 180 tablet 3  . spironolactone (ALDACTONE) 25 MG tablet Take 1 tablet (25 mg total) by mouth daily. 90 tablet 3  . sulfaSALAzine (AZULFIDINE) 500 MG tablet Take 1 tablet (500 mg total) by mouth 2 (two) times daily. 60 tablet 6  . XARELTO 2.5 MG TABS tablet TAKE 1 TABLET BY MOUTH TWICE DAILY 60 tablet 6  . rosuvastatin (CRESTOR) 10 MG tablet Take 1 tablet (10 mg total) by mouth daily. 90 tablet 3   No current facility-administered medications for this encounter.    BP 122/70   Pulse (!) 51   Wt 89.4 kg (197 lb 3.2 oz)   SpO2 99% Comment: 4L of oxygen  BMI 33.85 kg/m  General: NAD Neck: JVP 8 cm with HJR, no thyromegaly or thyroid nodule.  Lungs: Dry crackles at bases.  CV: Nondisplaced PMI.  Heart regular S1/S2, no S3/S4, no murmur. 1+ ankle edema.  No carotid bruit.  Unable to palpate pedal pulses.  Abdomen: Soft, nontender, no hepatosplenomegaly, no distention.  Skin: Intact without lesions or rashes.  Neurologic: Alert and oriented x 3.  Psych: Normal affect. Extremities: No clubbing or cyanosis.  HEENT: Normal.   Assessment/Plan: 1. Chronic diastolic CHF with prominent RV failure: Moderately dilated RV with moderately decreased systolic function on 7/12 echo.   [place] in 3/19 showed normal right  and left heart filling pressures.   Chronic NYHA class III symptoms.  Suspect this is from a combination of CHF, COPD and IPF.  Though weight has trended down, she looks volume overloaded on exam (mildly). - Increase Lasix to 120 qam/80 qpm alternating with Lasix 80 mg bid. BMET 10 days.  - Continue spironolactone 25 mg daily.   2. Pulmonary hypertension: PASP 74 mmHg on 1/19 echo with appearance of cor pulmonale.  CTA chest in 1/19 showed chronic fibrotic changes in the lungs but not emphysema, IPF diagnosed by Dr. Lake Bells.  However, PFTs in 3/19 were suggestive of moderate-severe obstruction consistent with COPD.  RHC (3/19) showed moderate pulmonary arterial hypertension.  V/Q scan showed no evidence for chronic pulmonary embolus.  My suspicion is that her pulmonary hypertension is primarily group 3 due to COPD and idiopathic pulmonary fibrosis.  She has OSA as well.  - I am not inclined to treat with pulmonary vasodilators, Dr Lake Bells has agreed with this.   - Use home oxygen.  - Continue CPAP.  3. Pulmonary fibrosis: IPF.  She is now on Ofev.  She does not see much of a difference. 4. CAD: h/o BMS to RCA in 2004.  LHC in 3/19 showed 70% in-stent restenosis in the RCA.  With no chest pain, plan to treat medically.  - Continue ASA 81.  - She has extensive vascular disease.  Based on COMPASS study, I have her on rivaroxaban 2.5 mg bid.  - She tried stopping the statin to see if it would help her legs, but it did not.  She should resume Crestor 10 mg daily with lipids/LFTs in 2 months.  If she has worsening pain with Crestor, could switch to Repatha.   5. Carotid stenosis: Followed by [facility]. 6. PAD: Peripheral arterial dopplers (3/19) showed occluded left SFA.  She has foot pain bilaterally.   - Sees Dr. Oneida Alar, he has so far recommended medical management.  - She has quit smoking.  7. COPD: She has now quit smoking.   Followup 3 months.    Loralie Champagne 04/05/2019

## 2019-04-05 NOTE — Patient Instructions (Signed)
Increase Furosemide (Lasix) 120 mg (3 tabs) in AM and 80 mg (2 tabs) in PM every other day ALTERNATING with 80 mg (2 tabs) Twice daily every other day   Restart Crestor 10 mg daily  Labs today, we will call you if results are abnormal  Labs in 2 weeks, this can be done in [place] recommends that you schedule a follow-up appointment in: 3 months  At the [facility], you and your health needs are our priority. As part of our continuing mission to provide you with exceptional heart care, we have created designated Provider Care Teams. These Care Teams include your primary Cardiologist (physician) and Advanced Practice Providers (APPs- Physician Assistants and Nurse Practitioners) who all work together to provide you with the care you need, when you need it.   You may see any of the following providers on your designated Care Team at your next follow up: Marland Kitchen Dr Glori Bickers . Dr Loralie Champagne . Darrick Grinder, NP   Please be sure to bring in all your medications bottles to every appointment.

## 2019-04-19 ENCOUNTER — Other Ambulatory Visit (HOSPITAL_COMMUNITY)
Admission: RE | Admit: 2019-04-19 | Discharge: 2019-04-19 | Disposition: A | Discharge status: Home / Self Care | Payer: PPO | Source: Ambulatory Visit | Attending: Cardiology | Admitting: Cardiology

## 2019-04-19 DIAGNOSIS — R2689 Other abnormalities of gait and mobility: Secondary | ICD-10-CM | POA: Diagnosis not present

## 2019-04-19 DIAGNOSIS — I270 Primary pulmonary hypertension: Secondary | ICD-10-CM | POA: Diagnosis not present

## 2019-04-19 DIAGNOSIS — R062 Wheezing: Secondary | ICD-10-CM | POA: Diagnosis not present

## 2019-04-19 DIAGNOSIS — I5032 Chronic diastolic (congestive) heart failure: Secondary | ICD-10-CM | POA: Diagnosis not present

## 2019-04-19 LAB — BASIC METABOLIC PANEL
Anion gap: 9 (ref 5–15)
BUN: 23 mg/dL (ref 8–23)
CO2: 26 mmol/L (ref 22–32)
Calcium: 9.4 mg/dL (ref 8.9–10.3)
Chloride: 104 mmol/L (ref 98–111)
Creatinine, Ser: 0.94 mg/dL (ref 0.44–1.00)
GFR calc Af Amer: >60 mL/min (ref >60)
GFR calc non Af Amer: 58 mL/min — ABNORMAL LOW (ref >60)
Glucose, Bld: 95 mg/dL (ref 70–99)
Potassium: 4.1 mmol/L (ref 3.5–5.1)
Sodium: 139 mmol/L (ref 135–145)

## 2019-04-27 DIAGNOSIS — R062 Wheezing: Secondary | ICD-10-CM | POA: Diagnosis not present

## 2019-04-27 DIAGNOSIS — I5032 Chronic diastolic (congestive) heart failure: Secondary | ICD-10-CM | POA: Diagnosis not present

## 2019-04-27 DIAGNOSIS — R2689 Other abnormalities of gait and mobility: Secondary | ICD-10-CM | POA: Diagnosis not present

## 2019-04-27 DIAGNOSIS — J449 Chronic obstructive pulmonary disease, unspecified: Secondary | ICD-10-CM | POA: Diagnosis not present

## 2019-04-30 DIAGNOSIS — J449 Chronic obstructive pulmonary disease, unspecified: Secondary | ICD-10-CM | POA: Diagnosis not present

## 2019-04-30 DIAGNOSIS — R2689 Other abnormalities of gait and mobility: Secondary | ICD-10-CM | POA: Diagnosis not present

## 2019-04-30 DIAGNOSIS — I5032 Chronic diastolic (congestive) heart failure: Secondary | ICD-10-CM | POA: Diagnosis not present

## 2019-04-30 DIAGNOSIS — R062 Wheezing: Secondary | ICD-10-CM | POA: Diagnosis not present

## 2019-05-20 DIAGNOSIS — I5032 Chronic diastolic (congestive) heart failure: Secondary | ICD-10-CM | POA: Diagnosis not present

## 2019-05-20 DIAGNOSIS — R062 Wheezing: Secondary | ICD-10-CM | POA: Diagnosis not present

## 2019-05-20 DIAGNOSIS — R2689 Other abnormalities of gait and mobility: Secondary | ICD-10-CM | POA: Diagnosis not present

## 2019-05-25 DIAGNOSIS — I10 Essential (primary) hypertension: Secondary | ICD-10-CM | POA: Diagnosis not present

## 2019-05-25 DIAGNOSIS — Z79899 Other long term (current) drug therapy: Secondary | ICD-10-CM | POA: Diagnosis not present

## 2019-05-25 DIAGNOSIS — Z7901 Long term (current) use of anticoagulants: Secondary | ICD-10-CM | POA: Diagnosis not present

## 2019-05-25 DIAGNOSIS — M7989 Other specified soft tissue disorders: Secondary | ICD-10-CM | POA: Diagnosis not present

## 2019-05-25 DIAGNOSIS — M25571 Pain in right ankle and joints of right foot: Secondary | ICD-10-CM | POA: Diagnosis not present

## 2019-05-25 DIAGNOSIS — E7800 Pure hypercholesterolemia, unspecified: Secondary | ICD-10-CM | POA: Diagnosis not present

## 2019-05-25 DIAGNOSIS — Z7982 Long term (current) use of aspirin: Secondary | ICD-10-CM | POA: Diagnosis not present

## 2019-05-25 DIAGNOSIS — R609 Edema, unspecified: Secondary | ICD-10-CM | POA: Diagnosis not present

## 2019-05-25 DIAGNOSIS — I959 Hypotension, unspecified: Secondary | ICD-10-CM | POA: Diagnosis not present

## 2019-05-25 DIAGNOSIS — L03115 Cellulitis of right lower limb: Secondary | ICD-10-CM | POA: Diagnosis not present

## 2019-05-25 DIAGNOSIS — M1990 Unspecified osteoarthritis, unspecified site: Secondary | ICD-10-CM | POA: Diagnosis not present

## 2019-05-25 DIAGNOSIS — I252 Old myocardial infarction: Secondary | ICD-10-CM | POA: Diagnosis not present

## 2019-05-25 DIAGNOSIS — Z87891 Personal history of nicotine dependence: Secondary | ICD-10-CM | POA: Diagnosis not present

## 2019-05-25 DIAGNOSIS — R52 Pain, unspecified: Secondary | ICD-10-CM | POA: Diagnosis not present

## 2019-05-27 DIAGNOSIS — J449 Chronic obstructive pulmonary disease, unspecified: Secondary | ICD-10-CM | POA: Diagnosis not present

## 2019-05-27 DIAGNOSIS — R2689 Other abnormalities of gait and mobility: Secondary | ICD-10-CM | POA: Diagnosis not present

## 2019-05-27 DIAGNOSIS — R062 Wheezing: Secondary | ICD-10-CM | POA: Diagnosis not present

## 2019-05-27 DIAGNOSIS — I5032 Chronic diastolic (congestive) heart failure: Secondary | ICD-10-CM | POA: Diagnosis not present

## 2019-05-28 ENCOUNTER — Telehealth: Payer: Self-pay | Admitting: Adult Health

## 2019-05-28 NOTE — Telephone Encounter (Signed)
Should be fine as long as no diarrhea or multiple stools a day, or anorexia , weight loss or other GI symptoms  Cont on current regimen  Follow up as planned, if she would like to move up visit, I am glad to see her next week.   Please contact office for sooner follow up if symptoms do not improve or worsen or seek emergency care

## 2019-05-28 NOTE — Telephone Encounter (Signed)
Spoke with the pt and notified of recs per TP  She verbalized understanding Nothing further needed

## 2019-05-28 NOTE — Telephone Encounter (Signed)
BQ patient last seen in the office 7.21.2020 by [name].  Patient is scheduled for routine follow up w/ Parrett NP on 10.19.2020.  Faxed received from [facility] stating:  We are contacting you to inform you that we have spoken with your patient Shelley [name], or their caregiver, on 9.23.2020.  She is experiencing side effects that may require follow up.  We have instructed them to contact you.   Called spoke with patient to inquire about the above.  Patient stated that she mentioned to the [employer] rep that she always has a BM within 1 hour of taking her Ofev.  Denies any diarrhea, loose stools - BM is 'normal.'  Patient stated she was unsure if this was a side effect that needed to be reported.  Patient does take each dose with food.  Routing to Parrett NP to advise.

## 2019-05-30 DIAGNOSIS — R062 Wheezing: Secondary | ICD-10-CM | POA: Diagnosis not present

## 2019-05-30 DIAGNOSIS — J449 Chronic obstructive pulmonary disease, unspecified: Secondary | ICD-10-CM | POA: Diagnosis not present

## 2019-05-30 DIAGNOSIS — R2689 Other abnormalities of gait and mobility: Secondary | ICD-10-CM | POA: Diagnosis not present

## 2019-05-30 DIAGNOSIS — I5032 Chronic diastolic (congestive) heart failure: Secondary | ICD-10-CM | POA: Diagnosis not present

## 2019-05-31 ENCOUNTER — Ambulatory Visit: Payer: PPO | Admitting: "Endocrinology

## 2019-05-31 DIAGNOSIS — Z23 Encounter for immunization: Secondary | ICD-10-CM | POA: Diagnosis not present

## 2019-05-31 DIAGNOSIS — M1990 Unspecified osteoarthritis, unspecified site: Secondary | ICD-10-CM | POA: Diagnosis not present

## 2019-06-02 DIAGNOSIS — R062 Wheezing: Secondary | ICD-10-CM | POA: Diagnosis not present

## 2019-06-02 DIAGNOSIS — I5032 Chronic diastolic (congestive) heart failure: Secondary | ICD-10-CM | POA: Diagnosis not present

## 2019-06-02 DIAGNOSIS — R2689 Other abnormalities of gait and mobility: Secondary | ICD-10-CM | POA: Diagnosis not present

## 2019-06-14 ENCOUNTER — Encounter: Payer: Self-pay | Admitting: Adult Health

## 2019-06-14 ENCOUNTER — Ambulatory Visit: Payer: PPO | Admitting: Nurse Practitioner

## 2019-06-14 ENCOUNTER — Ambulatory Visit: Payer: PPO | Admitting: Adult Health

## 2019-06-14 ENCOUNTER — Other Ambulatory Visit: Payer: Self-pay

## 2019-06-14 VITALS — BP 124/66 | HR 53 | Temp 97.0°F | Ht 65.0 in | Wt 193.6 lb

## 2019-06-14 DIAGNOSIS — J9611 Chronic respiratory failure with hypoxia: Secondary | ICD-10-CM | POA: Diagnosis not present

## 2019-06-14 DIAGNOSIS — Z9989 Dependence on other enabling machines and devices: Secondary | ICD-10-CM

## 2019-06-14 DIAGNOSIS — G4733 Obstructive sleep apnea (adult) (pediatric): Secondary | ICD-10-CM | POA: Diagnosis not present

## 2019-06-14 DIAGNOSIS — J84112 Idiopathic pulmonary fibrosis: Secondary | ICD-10-CM

## 2019-06-14 MED ORDER — ALBUTEROL SULFATE HFA 108 (90 BASE) MCG/ACT IN AERS: 2.0000 | INHALATION_SPRAY | RESPIRATORY_TRACT | 5 refills | Status: DC | PRN

## 2019-06-14 NOTE — Progress Notes (Signed)
_0  ID: Shelley [name], female    DOB: 1940/10/25, 77 y.o.   MRN: [id number]  Chief Complaint  Patient presents with  . Follow-up    ILD    Referring provider: Sandi Mealy, MD  HPI: 78 year old female former smoker with medical history significant for ILD , obstructive sleep apnea , COPD,  diastolic heart failure, Pulmonary HTN (WHO group 3)   TEST/EVENTS :  CTA chest (1/19): No PE, air trapping noted, some centrilobular emphysema noted, some paraseptal emphysema noted, fibrotic change in the periphery and appears to be worse in the bases  V/Q scan (3/19): No evidence for chronic PE.  HRCT 11/2017: peripheral based recitulation, interlobular thickening and honeycombing worse in the bases, some traction bronchiectasis, some areas of preserved lung noted. Per radiology probably UIP, possible fibrotic NSIP; by [facility] read this is consistent with UIP; paratracheal adenopathy, thyroid nodule 2 cm, cardiomegally  PFT: - PFTs (3/19): Ratio 63%, FVC 1.86L FEV1 1.28 L 61% predicted, total lung capacity 4.2 L 80% predicted, DLCO 8.10 31% predicted  December 2019: FVC 1.75 L 62% predicted, DLCO 6.9 mL 26% predicted  Labs: 10/2017: ANA neg, SCL 70 neg, Anti-Jo-1 neg, Centromere neg, SSA/SSB neg, RF neg, HP panel neg  Echo: Echo (1/19) with EF 55-60%, mild LVH, mild MR, moderate RV dilation with moderately decreased systolic function, moderate TR, PASP 74 mmHg.   Echo 11/03/18 - The left ventricle has normal systolic function, with an ejection fraction of 55-60%. The cavity size was normal. Left ventricular diastolic Doppler parameters are consistent with impaired relaxation. Elevated mean left atrial pressure. 2. The right ventricle has moderately reduced systolic function. The cavity was moderately enlarged. There is no increase in right ventricular wall thickness. Right ventricular systolic pressure is severely elevated with an estimated pressure of 86.6  mmHg. 3.  Lateral annulus peak S velocity 8.7 cm/s. TAPSE 9.6 mm. 4. Right atrial size was moderately dilated. 5. Tricuspid valve regurgitation is moderate. 6. The aortic valve is tricuspid Moderate thickening of the aortic valve Moderate calcification of the aortic valve. 7. The inferior vena cava was dilated in size with <50% respiratory variability.  OFEV started 12/2017 (lower dose due to Crohns hx )    Heart Catheterization: - RHC (3/19): mean RA 2, PA 54/16 mean 28, mean PCWP 8, CI 2.55, PVR 3.9 WU.  Sleep study: July 2019 home sleep test showed moderate obstructive sleep apnea with an AHI of 28, O2 saturation nadir 75%, 700 minutes below 89   06/14/2019 Follow up : Pulmonary Fibrosis , COPD , O2 RF , Pulmonary HTN , Diastolic CHF  Patient returns for a 25-monthfollow-up.  Patient has known interstitial lung disease (probable UIP on HRCT chest).  She was started on Ofev May 2019. She is on lower dose due to Crohn's disease. Says no diarrhea but right after taking has to go to bathroom for BM . Says overall breathing is doing about the same , gets winded with minimal activity . Sedentary lifestyle, uses walker. Moves slowly to get around.   She has underlying Crohn's dz on Sulfasalazine for long while. Says under good control .   She has underlying COPD with long smoking hx . She denies significant cough. Rare use of Albuterol .   Has OSA on CPAP At bedtime. Wears most every night. Does not like it but wears it anyway. Download shows good compliance with average use at 7.5hr . AHI 5/hr .  + leaks. Says mask does okay ,  has tried several but likes this one. Feels rested.   Follows with cardiology for severe pulmonary HTN -WHO Group 3 (most likely from COPD, ILD, and OSA ) and diastolic dysfunction . Says weight stable , no increased leg swelling . Remains on Oxygen at 3l/m . Lasix and aldactone.    Allergies  Allergen Reactions  . Crestor [Rosuvastatin Calcium]     Muscle pain   .  Doxycycline Other (See Comments)    Poss. Photosensitivity  . Mercaptopurine Other (See Comments)    REACTION: pancreatitis  . Simvastatin Other (See Comments)    Severe leg aches  . Sulfasalazine   . Penicillins Rash and Other (See Comments)    Has patient had a PCN reaction causing immediate rash, facial/tongue/throat swelling, SOB or lightheadedness with hypotension: No Has patient had a PCN reaction causing severe rash involving mucus membranes or skin necrosis: No Has patient had a PCN reaction that required hospitalization: No Has patient had a PCN reaction occurring within the last 10 years: No If all of the above answers are "NO", then may proceed with Cephalosporin use.   . Sulfa Antibiotics Rash    Immunization History  Administered Date(s) Administered  . Influenza, High Dose Seasonal PF 05/31/2019  . Influenza,inj,Quad PF,6+ Mos 06/16/2017, 05/25/2018  . Influenza-Unspecified 06/14/2009, 06/09/2012, 05/28/2013, 05/11/2014, 06/15/2015  . Pneumococcal Conjugate-13 05/11/2014  . Pneumococcal Polysaccharide-23 04/02/2018  . Tdap 06/09/2012  . Zoster 06/09/2012    Past Medical History:  Diagnosis Date  . Anxiety   . Carotid artery disease ([place])    41-63% RICA and LICA 8/45 - Dr. Oneida Alar  . Cataract   . COPD (chronic obstructive pulmonary disease) ([place])   . Coronary atherosclerosis of native coronary artery    BMS to RCA 2004  . Depression   . DVT (deep venous thrombosis) ([place])   . Essential hypertension, benign   . Fracture Left Great Toe  . Hepatic steatosis   . History of melanoma   . History of oral aphthous ulcers   . History of Salmonella gastroenteritis   . Hx of adenomatous colonic polyps   . Mixed hyperlipidemia   . Myocardial infarction ([place])    IMI 10/04  . Osteoarthritis   . Osteoporosis   . Pancreatitis   . Rectal bleeding   . Regional enteritis of large intestine ([place])   . Sinus polyp   . TIA (transient ischemic attack)   . Tubular adenoma of  colon 08/2012    Tobacco History: Social History   Tobacco Use  Smoking Status Former Smoker  . Packs/day: 1.00  . Years: 40.00  . Pack years: 40.00  . Types: Cigarettes  . Start date: 06/02/1957  . Quit date: 09/2017  . Years since quitting: 1.7  Smokeless Tobacco Never Used   Counseling given: Not Answered   Outpatient Medications Prior to Visit  Medication Sig Dispense Refill  . aspirin 81 MG tablet Take 81 mg by mouth daily.      . calcium citrate-vitamin D (CITRACAL+D) 315-200 MG-UNIT tablet Take 1 tablet by mouth daily.     . ferrous sulfate 325 (65 FE) MG tablet Take 325 mg by mouth daily.    Marland Kitchen FLUoxetine (PROZAC) 40 MG capsule Take 40 mg by mouth daily.    . folic acid (FOLVITE) 1 MG tablet TAKE 1 TABLET BY MOUTH EVERY DAY 90 tablet 4  . furosemide (LASIX) 40 MG tablet Take 2 tabs Twice daily every other day ALTERNATING with 3 tabs in AM  and 2 tabs in PM every other day 450 tablet 3  . HYDROcodone-acetaminophen (NORCO) 10-325 MG tablet Take 1 tablet by mouth every 6 (six) hours as needed for moderate pain.     . metoprolol succinate (TOPROL-XL) 25 MG 24 hr tablet TAKE 1 TABLET BY MOUTH EVERY DAY 90 tablet 2  . Misc Natural Products (OSTEO BI-FLEX JOINT SHIELD PO) Take 1 capsule by mouth daily.    . Nintedanib (OFEV) 100 MG CAPS Take 1 capsule (100 mg total) by mouth 2 (two) times daily. 60 capsule 11  . nitroGLYCERIN (NITROSTAT) 0.4 MG SL tablet Place 1 tablet (0.4 mg total) under the tongue every 5 (five) minutes x 3 doses as needed. 25 tablet 3  . Omega-3 Fatty Acids (FISH OIL) 1200 MG CAPS Take 1,200 mg by mouth daily.     . potassium chloride SA (K-DUR) 20 MEQ tablet Take 1 tablet (20 mEq total) by mouth 2 (two) times daily. 180 tablet 3  . rosuvastatin (CRESTOR) 10 MG tablet Take 1 tablet (10 mg total) by mouth daily. 90 tablet 3  . spironolactone (ALDACTONE) 25 MG tablet Take 1 tablet (25 mg total) by mouth daily. 90 tablet 3  . sulfaSALAzine (AZULFIDINE) 500 MG  tablet Take 1 tablet (500 mg total) by mouth 2 (two) times daily. 60 tablet 6  . XARELTO 2.5 MG TABS tablet TAKE 1 TABLET BY MOUTH TWICE DAILY 60 tablet 6   No facility-administered medications prior to visit.      Review of Systems:   Constitutional:   No  weight loss, night sweats,  Fevers, chills,  +fatigue, or  lassitude.  HEENT:   No headaches,  Difficulty swallowing,  Tooth/dental problems, or  Sore throat,                No sneezing, itching, ear ache,  +nasal congestion, post nasal drip,   CV:  No chest pain,  Orthopnea, PND, + swelling in lower extremities No , anasarca, dizziness, palpitations, syncope.   GI  No heartburn, indigestion, abdominal pain, nausea, vomiting, diarrhea, change in bowel habits, loss of appetite, bloody stools.   Resp:    No chest wall deformity  Skin: no rash or lesions.  GU: no dysuria, change in color of urine, no urgency or frequency.  No flank pain, no hematuria   MS:  No joint pain or swelling.  No decreased range of motion.  No back pain.    Physical Exam  BP 124/66 (BP Location: Left Arm, Cuff Size: Normal)   Pulse (!) 53   Temp (!) 97 F (36.1 C) (Temporal)   Ht _0  (1.651 m)   Wt 193 lb 9.6 oz (87.8 kg)   SpO2 93%   BMI 32.22 kg/m   GEN: A/Ox3; pleasant , NAD,Obese per BMI    HEENT:  [place]/AT,   NOSE-clear, THROAT-clear, no lesions, no postnasal drip or exudate noted. Class 2-3 MP airway   NECK:  Supple w/ fair ROM; no JVD; normal carotid impulses w/o bruits; no thyromegaly or nodules palpated; no lymphadenopathy.    RESP  BB crackles no accessory muscle use, no dullness to percussion  CARD:  RRR, no m/r/g, tr -1  peripheral edema, pulses intact, no cyanosis or clubbing.  GI:   Soft & nt; nml bowel sounds; no organomegaly or masses detected.   Musco: Warm bil, no deformities or joint swelling noted.   Neuro: alert, no focal deficits noted.    Skin: Warm, no lesions or rashes  Lab Results:  CBC    Component  Value Date/Time   WBC 5.6 04/05/2019 1229   RBC 3.67 (L) 04/05/2019 1229   HGB 12.3 04/05/2019 1229   HCT 38.7 04/05/2019 1229   PLT 160 04/05/2019 1229   MCV 105.4 (H) 04/05/2019 1229   MCH 33.5 04/05/2019 1229   MCHC 31.8 04/05/2019 1229   RDW 13.4 04/05/2019 1229   LYMPHSABS 1.4 07/27/2018 1609   MONOABS 0.5 07/27/2018 1609   EOSABS 0.1 07/27/2018 1609   BASOSABS 0.0 07/27/2018 1609    BMET    Component Value Date/Time   NA 139 04/19/2019 1156   K 4.1 04/19/2019 1156   CL 104 04/19/2019 1156   CO2 26 04/19/2019 1156   GLUCOSE 95 04/19/2019 1156   BUN 23 04/19/2019 1156   CREATININE 0.94 04/19/2019 1156   CALCIUM 9.4 04/19/2019 1156   GFRNONAA 58 (L) 04/19/2019 1156   GFRAA >60 04/19/2019 1156   Hepatic Function Latest Ref Rng & Units 03/16/2019 11/13/2018 07/27/2018  Total Protein 6.0 - 8.3 g/dL 6.7 4.7(L) 6.9  Albumin 3.5 - 5.2 g/dL 4.0 3.9 4.0  AST 0 - 37 U/L _0 ALT 0 - 35 U/L _1 Alk Phosphatase 39 - 117 U/L 71 69 65  Total Bilirubin 0.2 - 1.2 mg/dL 0.8 1.3(H) 0.8  Bilirubin, Direct 0.0 - 0.3 mg/dL 0.2 0.3(H) -     BNP No results found for: BNP  ProBNP No results found for: PROBNP  Imaging: No results found.    PFT Results Latest Ref Rng & Units 07/27/2018 10/29/2017  FVC-Pre L 1.75 1.86  FVC-Predicted Pre % 62 66  FVC-Post L - 2.01  FVC-Predicted Post % - 71  Pre FEV1/FVC % % 78 62  Post FEV1/FCV % % - 63  FEV1-Pre L 1.36 1.15  FEV1-Predicted Pre % 65 54  FEV1-Post L - 1.28  DLCO UNC% % 26 31  DLCO COR %Predicted % 42 50  TLC L 3.78 4.19  TLC % Predicted % 72 80  RV % Predicted % 79 91    No results found for: NITRICOXIDE      Assessment & Plan:   IPF (idiopathic pulmonary fibrosis) (HCC) Appears clinically stable- Need repeat PFTs and HRCT chest on return. Has Crohn's disease on Sulfasalazine - autoimmune/CTD workup negative.  Will need to look at serial PFT/HRCT chest on return if progressive may need to look at  Sulfasalazine closer.   Plan  Patient Instructions  Albuterol inhaler or Neb As needed  Wheezing .  Continue on Oxygen 3-4 l/m , goal to have oxygen >88%.  Continue on OFEV 178m Twice daily  . Take with food.  HRCT Chest in February 2021 .  Continue on CPAP At bedtime.  Work on healthy weight . Low salt diet  Continue follow up with Cardiology .  Follow up with Dr. MVaughan Brownerin 3-4 months with Spirometry with DLCO  and As needed   Please contact office for sooner follow up if symptoms do not improve or worsen or seek emergency care        Chronic respiratory failure with hypoxia ([place] Stable [place] O[place]   Plan  Patient Instructions  Albuterol inhaler or Neb As needed  Wheezing .  Continue on Oxygen 3-4 l/m , goal to have oxygen >88%.  Continue on OFEV 1079mTwice daily  . Take with food.  HRCT Chest in February 2021 .  Continue on CPAP At bedtime.  Work  on healthy weight . Low salt diet  Continue follow up with Cardiology .  Follow up with Dr. Vaughan Browner in 3-4 months with Spirometry with DLCO  and As needed   Please contact office for sooner follow up if symptoms do not improve or worsen or seek emergency care    '   OSA on CPAP Doing well on CPAP   Plan  Patient Instructions  Albuterol inhaler or Neb As needed  Wheezing .  Continue on Oxygen 3-4 l/m , goal to have oxygen >88%.  Continue on OFEV 192m Twice daily  . Take with food.  HRCT Chest in February 2021 .  Continue on CPAP At bedtime.  Work on healthy weight . Low salt diet  Continue follow up with Cardiology .  Follow up with Dr. MVaughan Brownerin 3-4 months with Spirometry with DLCO  and As needed   Please contact office for sooner follow up if symptoms do not improve or worsen or seek emergency care          TRexene Edison NP 06/14/2019

## 2019-06-14 NOTE — Assessment & Plan Note (Signed)
Doing well on CPAP   Plan  Patient Instructions  Albuterol inhaler or Neb As needed  Wheezing .  Continue on Oxygen 3-4 l/m , goal to have oxygen >88%.  Continue on OFEV 156m Twice daily  . Take with food.  HRCT Chest in February 2021 .  Continue on CPAP At bedtime.  Work on healthy weight . Low salt diet  Continue follow up with Cardiology .  Follow up with Dr. MVaughan Brownerin 3-4 months with Spirometry with DLCO  and As needed   Please contact office for sooner follow up if symptoms do not improve or worsen or seek emergency care

## 2019-06-14 NOTE — Assessment & Plan Note (Signed)
Stable on Oxygen   Plan  Patient Instructions  Albuterol inhaler or Neb As needed  Wheezing .  Continue on Oxygen 3-4 l/m , goal to have oxygen >88%.  Continue on OFEV 114m Twice daily  . Take with food.  HRCT Chest in February 2021 .  Continue on CPAP At bedtime.  Work on healthy weight . Low salt diet  Continue follow up with Cardiology .  Follow up with Dr. MVaughan Brownerin 3-4 months with Spirometry with DLCO  and As needed   Please contact office for sooner follow up if symptoms do not improve or worsen or seek emergency care    '

## 2019-06-14 NOTE — Assessment & Plan Note (Addendum)
Appears clinically stable- Need repeat PFTs and HRCT chest on return. Has Crohn's disease on Sulfasalazine - autoimmune/CTD workup negative.  Will need to look at serial PFT/HRCT chest on return if progressive may need to look at Sulfasalazine closer.   Plan  Patient Instructions  Albuterol inhaler or Neb As needed  Wheezing .  Continue on Oxygen 3-4 l/m , goal to have oxygen >88%.  Continue on OFEV 118m Twice daily  . Take with food.  HRCT Chest in February 2021 .  Continue on CPAP At bedtime.  Work on healthy weight . Low salt diet  Continue follow up with Cardiology .  Follow up with Dr. MVaughan Brownerin 3-4 months with Spirometry with DLCO  and As needed   Please contact office for sooner follow up if symptoms do not improve or worsen or seek emergency care

## 2019-06-14 NOTE — Patient Instructions (Addendum)
Albuterol inhaler or Neb As needed  Wheezing .  Continue on Oxygen 3-4 l/m , goal to have oxygen >88%.  Continue on OFEV 187m Twice daily  . Take with food.  HRCT Chest in February 2021 .  Continue on CPAP At bedtime.  Work on healthy weight . Low salt diet  Continue follow up with Cardiology .  Follow up with Dr. MVaughan Brownerin 3-4 months with Spirometry with DLCO  and As needed   Please contact office for sooner follow up if symptoms do not improve or worsen or seek emergency care

## 2019-06-16 ENCOUNTER — Telehealth: Payer: Self-pay | Admitting: Adult Health

## 2019-06-17 NOTE — Telephone Encounter (Signed)
Attempted to call patient, no answer, no voicemail to leave message.

## 2019-06-18 NOTE — Telephone Encounter (Signed)
Patient called back. Patient reported she received a letter in the mail stating that insurance would not cover her oxygen due to qualifications not being met.  Patient did have OV and re-qulaified for oxygen on 03/16/2019.  I called [employer] who is her DME and spoke to [place].  Levada Dy stated they did not receive anything for re-qualification and on their end it shows this is still needed. Levada Dy requested OV and qualifying numbers be faxed. I faxed information to [phone].    Will leave open to follow up with [employer] and patient.

## 2019-06-19 DIAGNOSIS — I5032 Chronic diastolic (congestive) heart failure: Secondary | ICD-10-CM | POA: Diagnosis not present

## 2019-06-19 DIAGNOSIS — R062 Wheezing: Secondary | ICD-10-CM | POA: Diagnosis not present

## 2019-06-19 DIAGNOSIS — R2689 Other abnormalities of gait and mobility: Secondary | ICD-10-CM | POA: Diagnosis not present

## 2019-06-25 NOTE — Telephone Encounter (Signed)
Call made to [employer]. Informed nothing further is needed.   Left message informing patient.   Nothing further is needed at this time.

## 2019-06-27 DIAGNOSIS — J449 Chronic obstructive pulmonary disease, unspecified: Secondary | ICD-10-CM | POA: Diagnosis not present

## 2019-06-27 DIAGNOSIS — R062 Wheezing: Secondary | ICD-10-CM | POA: Diagnosis not present

## 2019-06-27 DIAGNOSIS — I5032 Chronic diastolic (congestive) heart failure: Secondary | ICD-10-CM | POA: Diagnosis not present

## 2019-06-27 DIAGNOSIS — R2689 Other abnormalities of gait and mobility: Secondary | ICD-10-CM | POA: Diagnosis not present

## 2019-06-30 DIAGNOSIS — I5032 Chronic diastolic (congestive) heart failure: Secondary | ICD-10-CM | POA: Diagnosis not present

## 2019-06-30 DIAGNOSIS — R062 Wheezing: Secondary | ICD-10-CM | POA: Diagnosis not present

## 2019-06-30 DIAGNOSIS — R2689 Other abnormalities of gait and mobility: Secondary | ICD-10-CM | POA: Diagnosis not present

## 2019-06-30 DIAGNOSIS — J449 Chronic obstructive pulmonary disease, unspecified: Secondary | ICD-10-CM | POA: Diagnosis not present

## 2019-07-13 ENCOUNTER — Other Ambulatory Visit: Payer: Self-pay

## 2019-07-13 ENCOUNTER — Ambulatory Visit (HOSPITAL_COMMUNITY)
Admission: RE | Admit: 2019-07-13 | Discharge: 2019-07-13 | Disposition: A | Discharge status: Home / Self Care | Payer: PPO | Source: Ambulatory Visit | Attending: Cardiology | Admitting: Cardiology

## 2019-07-13 ENCOUNTER — Encounter (HOSPITAL_COMMUNITY): Payer: Self-pay | Admitting: Cardiology

## 2019-07-13 VITALS — BP 116/35 | HR 55 | Wt 188.2 lb

## 2019-07-13 DIAGNOSIS — Z87891 Personal history of nicotine dependence: Secondary | ICD-10-CM | POA: Insufficient documentation

## 2019-07-13 DIAGNOSIS — I2721 Secondary pulmonary arterial hypertension: Secondary | ICD-10-CM | POA: Diagnosis not present

## 2019-07-13 DIAGNOSIS — Z8249 Family history of ischemic heart disease and other diseases of the circulatory system: Secondary | ICD-10-CM | POA: Insufficient documentation

## 2019-07-13 DIAGNOSIS — Z79899 Other long term (current) drug therapy: Secondary | ICD-10-CM | POA: Diagnosis not present

## 2019-07-13 DIAGNOSIS — K5090 Crohn's disease, unspecified, without complications: Secondary | ICD-10-CM | POA: Diagnosis not present

## 2019-07-13 DIAGNOSIS — Z7982 Long term (current) use of aspirin: Secondary | ICD-10-CM | POA: Diagnosis not present

## 2019-07-13 DIAGNOSIS — F329 Major depressive disorder, single episode, unspecified: Secondary | ICD-10-CM | POA: Insufficient documentation

## 2019-07-13 DIAGNOSIS — M109 Gout, unspecified: Secondary | ICD-10-CM | POA: Insufficient documentation

## 2019-07-13 DIAGNOSIS — I110 Hypertensive heart disease with heart failure: Secondary | ICD-10-CM | POA: Insufficient documentation

## 2019-07-13 DIAGNOSIS — J84112 Idiopathic pulmonary fibrosis: Secondary | ICD-10-CM | POA: Insufficient documentation

## 2019-07-13 DIAGNOSIS — Z86718 Personal history of other venous thrombosis and embolism: Secondary | ICD-10-CM | POA: Insufficient documentation

## 2019-07-13 DIAGNOSIS — I5032 Chronic diastolic (congestive) heart failure: Secondary | ICD-10-CM | POA: Diagnosis not present

## 2019-07-13 DIAGNOSIS — I2510 Atherosclerotic heart disease of native coronary artery without angina pectoris: Secondary | ICD-10-CM | POA: Insufficient documentation

## 2019-07-13 DIAGNOSIS — Z7901 Long term (current) use of anticoagulants: Secondary | ICD-10-CM | POA: Insufficient documentation

## 2019-07-13 DIAGNOSIS — Z8673 Personal history of transient ischemic attack (TIA), and cerebral infarction without residual deficits: Secondary | ICD-10-CM | POA: Insufficient documentation

## 2019-07-13 DIAGNOSIS — G4733 Obstructive sleep apnea (adult) (pediatric): Secondary | ICD-10-CM | POA: Insufficient documentation

## 2019-07-13 DIAGNOSIS — I739 Peripheral vascular disease, unspecified: Secondary | ICD-10-CM | POA: Diagnosis not present

## 2019-07-13 DIAGNOSIS — E782 Mixed hyperlipidemia: Secondary | ICD-10-CM | POA: Diagnosis not present

## 2019-07-13 DIAGNOSIS — J449 Chronic obstructive pulmonary disease, unspecified: Secondary | ICD-10-CM | POA: Insufficient documentation

## 2019-07-13 LAB — CBC
HCT: 41 % (ref 36.0–46.0)
Hemoglobin: 12.9 g/dL (ref 12.0–15.0)
MCH: 33 pg (ref 26.0–34.0)
MCHC: 31.5 g/dL (ref 30.0–36.0)
MCV: 104.9 fL — ABNORMAL HIGH (ref 80.0–100.0)
Platelets: 222 10*3/uL (ref 150–400)
RBC: 3.91 MIL/uL (ref 3.87–5.11)
RDW: 13 % (ref 11.5–15.5)
WBC: 7.1 10*3/uL (ref 4.0–10.5)
nRBC: 0 % (ref 0.0–0.2)

## 2019-07-13 LAB — BASIC METABOLIC PANEL
Anion gap: 10 (ref 5–15)
BUN: 21 mg/dL (ref 8–23)
CO2: 25 mmol/L (ref 22–32)
Calcium: 9.6 mg/dL (ref 8.9–10.3)
Chloride: 101 mmol/L (ref 98–111)
Creatinine, Ser: 1.06 mg/dL — ABNORMAL HIGH (ref 0.44–1.00)
GFR calc Af Amer: 57 mL/min — ABNORMAL LOW (ref >60)
GFR calc non Af Amer: 50 mL/min — ABNORMAL LOW (ref >60)
Glucose, Bld: 98 mg/dL (ref 70–99)
Potassium: 3.7 mmol/L (ref 3.5–5.1)
Sodium: 136 mmol/L (ref 135–145)

## 2019-07-13 LAB — URIC ACID: Uric Acid, Serum: 12 mg/dL — ABNORMAL HIGH (ref 2.5–7.1)

## 2019-07-13 MED ORDER — NITROGLYCERIN 0.4 MG SL SUBL: 0.4000 mg | SUBLINGUAL_TABLET | SUBLINGUAL | 3 refills | Status: AC | PRN

## 2019-07-13 NOTE — Patient Instructions (Addendum)
Labs done today. We will contact you only if your labs are abnormal.  Your provider recommends that you wear compression stockings to help control your swelling.(script was given in office) You can purchase these at [facility] supply located at [address] [place], [place] [address] (P) [phone]  No medication changes were made today. Please continue all current medications as prescribed.  You have been referred to the [facility] for Hyperlipidemia. That office will contact you to schedule an appointment.   Your physician recommends that you schedule a follow-up appointment in: 4 months.  At the [facility], you and your health needs are our priority. As part of our continuing mission to provide you with exceptional heart care, we have created designated Provider Care Teams. These Care Teams include your primary Cardiologist (physician) and Advanced Practice Providers (APPs- Physician Assistants and Nurse Practitioners) who all work together to provide you with the care you need, when you need it.   You may see any of the following providers on your designated Care Team at your next follow up: Marland Kitchen Dr Glori Bickers . Dr Loralie Champagne . Darrick Grinder, NP . Lyda Jester, PA   Please be sure to bring in all your medications bottles to every appointment.

## 2019-07-13 NOTE — Progress Notes (Signed)
PCP: Dr. Lorra Hals Cardiology: Dr. Domenic Polite HF Cardiology: Dr. Aundra Dubin  78 y.o. with history of prior DVT, CAD, HTN was referred by Dr. Domenic Polite for evaluation of pulmonary hypertension/RV failure.  Patient was a long-time smoker, quit in 3/19. In 1/19, she began to develop peripheral edema.  This gradually worsened and Lasix was started.   In 3/19, I took her for right/left heart cath.  This showed 70% in-stent restenosis in the RCA, medically managed.  She had normal filling pressures on RHC with moderate pulmonary hypertension.  V/Q scan showed no chronic PE and PFTs showed moderate to severe obstructive defect.   She saw Dr. Lake Bells with pulmonary, and was diagnosed with both COPD and idiopathic pulmonary fibrosis.  She was started on Ofev.    She returns for followup of CHF and pulmonary hypertension.  She is using home oxygen during the day and CPAP at night.  She stopped Crestor again due to myalgias.  Weight is down 9 lbs.  She has stable dyspnea walking from one end of her house to the other end.  No BRBPR/melena.  No orthopnea/PND.  She continues to have bilateral leg pain with exertion ("whole leg hurts").  No chest pain.  No lightheadedness.  No cough despite ILD. She had a recent gout flare that is improving with colchicine.   Labs (1/19): K 3.8, creatinine 0.74 Labs (3/19): K 4.6, creatinine 0.87 Labs (5/19): LDL 52 Labs (7/19): K 4.3, creatinine 0.82 Labs (11/19): LDL 55, TGs 73 Labs (12/19): K 4.1, creatinine 0.87 => 0.99 Labs (6/20): K 3.7, creatinine 0.97 Labs (7/20): K 3.7, creatinine 0.84 Labs (8/20): K 4.1, creatinine 0.94  PMH: 1. CAD: BMS to RCA in 2004.  - Cardiolite (12/16): EF 52%, fixed inferior defect with no ischemia.  - LHC (3/19): 70% in-stent restenosis in RCA stent, managed medically.  2. Depression 3. H/o DVT 4. HTN 5. Osteoarthritis: h/o R TKR.  6. H/o melanoma 7. Crohns disease: Sulfasalazine. 8. Hyperlipidemia 9. Carotid stenosis: Followed at [facility].   - Carotid dopplers (11/18): RICA 52-84% stenosis, LICA 13-24% stenosis.  - Carotid dopplers (40/10): RICA 27-25%, LICA 36-64%.  - Carotid dopplers (6/20): RICA 40-34% stenosis, LICA 74-25% stenosis.  10. COPD: No longer smokes. -  PFTs (3/19): moderate-severe obstruction consistent with COPD. 11. Pulmonary hypertension/RV failure: Suspect primarily group 3.  Echo (1/19) with EF 55-60%, mild LVH, mild MR, moderate RV dilation with moderately decreased systolic function, moderate TR, PASP 74 mmHg.  - CTA chest (1/19): No PE, chronic fibrotic changes in the lungs, 6 mm RUL nodule.  - RHC (3/19): mean RA 2, PA 54/16 mean 28, mean PCWP 8, CI 2.55, PVR 3.9 WU.  - PFTs (3/19): moderate-severe obstruction consistent with COPD.  - V/Q scan (3/19): No evidence for chronic PE.  - Echo (3/20): EF 55-60%, moderately enlarged RV with moderately decreased RV systolic function, PASP 87 mmHg, moderate TR, dilated IVC.  12. TIA  13. PAD: Peripheral arterial dopplers (3/19) with totally occluded right AT, totally occluded left SFA, severe stenosis in left PT.  - ABIs (12/19): 0.73 on left, 1.05 on right.  - ABIs (6/20): Stable compared to prior.  14. Idiopathic pulmonary fibrosis: Followed by Dr. Lake Bells, she is on Ofev.  15. LBBB 16. Multinodular goiter 17. OSA: CPAP.  18. Gout  Social History   Socioeconomic History  . Marital status: Divorced    Spouse name: Not on file  . Number of children: 2  . Years of education: Not on file  .  Highest education level: Not on file  Occupational History  . Occupation: Retired    Fish farm manager: RETIRED  Social Needs  . Financial resource strain: Not on file  . Food insecurity    Worry: Not on file    Inability: Not on file  . Transportation needs    Medical: Not on file    Non-medical: Not on file  Tobacco Use  . Smoking status: Former Smoker    Packs/day: 1.00    Years: 40.00    Pack years: 40.00    Types: Cigarettes    Start date: 06/02/1957    Quit  date: 09/2017    Years since quitting: 1.7  . Smokeless tobacco: Never Used  Substance and Sexual Activity  . Alcohol use: No    Alcohol/week: 0.0 standard drinks  . Drug use: No  . Sexual activity: Not on file  Lifestyle  . Physical activity    Days per week: Not on file    Minutes per session: Not on file  . Stress: Not on file  Relationships  . Social Herbalist on phone: Not on file    Gets together: Not on file    Attends religious service: Not on file    Active member of club or organization: Not on file    Attends meetings of clubs or organizations: Not on file    Relationship status: Not on file  . Intimate partner violence    Fear of current or ex partner: Not on file    Emotionally abused: Not on file    Physically abused: Not on file    Forced sexual activity: Not on file  Other Topics Concern  . Not on file  Social History Narrative  . Not on file   Family History  Problem Relation Age of Onset  . Diabetes type II Mother   . Heart attack Mother 51       questionable  . Uterine cancer Mother   . Heart disease Mother        before age 8  . Varicose Veins Mother   . Other Brother        PVD and hx DVT  . Deep vein thrombosis Brother   . Breast cancer Sister   . Hyperlipidemia Sister   . Hypertension Sister   . Colon cancer Neg Hx    ROS: All systems reviewed and negative except as per HPI.  Current Outpatient Medications  Medication Sig Dispense Refill  . albuterol (PROAIR HFA) 108 (90 Base) MCG/ACT inhaler Inhale 2 puffs into the lungs every 4 (four) hours as needed for wheezing or shortness of breath. 8 g 5  . aspirin 81 MG tablet Take 81 mg by mouth daily.      . calcium citrate-vitamin D (CITRACAL+D) 315-200 MG-UNIT tablet Take 1 tablet by mouth daily.     . colchicine 0.6 MG tablet Take 0.6 mg by mouth daily.     . ferrous sulfate 325 (65 FE) MG tablet Take 325 mg by mouth daily.    Marland Kitchen FLUoxetine (PROZAC) 40 MG capsule Take 40 mg by  mouth daily.    . folic acid (FOLVITE) 1 MG tablet TAKE 1 TABLET BY MOUTH EVERY DAY 90 tablet 4  . furosemide (LASIX) 40 MG tablet Take 2 tabs Twice daily every other day ALTERNATING with 3 tabs in AM and 2 tabs in PM every other day 450 tablet 3  . HYDROcodone-acetaminophen (NORCO) 10-325 MG tablet Take 1 tablet  by mouth every 6 (six) hours as needed for moderate pain.     . metoprolol succinate (TOPROL-XL) 25 MG 24 hr tablet TAKE 1 TABLET BY MOUTH EVERY DAY 90 tablet 2  . Misc Natural Products (OSTEO BI-FLEX JOINT SHIELD PO) Take 1 capsule by mouth daily.    . Nintedanib (OFEV) 100 MG CAPS Take 1 capsule (100 mg total) by mouth 2 (two) times daily. 60 capsule 11  . nitroGLYCERIN (NITROSTAT) 0.4 MG SL tablet Place 1 tablet (0.4 mg total) under the tongue every 5 (five) minutes x 3 doses as needed. 25 tablet 3  . Omega-3 Fatty Acids (FISH OIL) 1200 MG CAPS Take 1,200 mg by mouth daily.     . potassium chloride SA (K-DUR) 20 MEQ tablet Take 1 tablet (20 mEq total) by mouth 2 (two) times daily. 180 tablet 3  . spironolactone (ALDACTONE) 25 MG tablet Take 1 tablet (25 mg total) by mouth daily. 90 tablet 3  . sulfaSALAzine (AZULFIDINE) 500 MG tablet Take 1 tablet (500 mg total) by mouth 2 (two) times daily. 60 tablet 6  . XARELTO 2.5 MG TABS tablet TAKE 1 TABLET BY MOUTH TWICE DAILY 60 tablet 6   No current facility-administered medications for this encounter.    BP (!) 116/35   Pulse (!) 55   Wt 85.4 kg (188 lb 3.2 oz)   SpO2 100% Comment: 3L of oxygen  BMI 31.32 kg/m  General: NAD Neck: No JVD, no thyromegaly or thyroid nodule.  Lungs: Dry crackles at bases bilaterally.  CV: Nondisplaced PMI.  Heart regular S1/S2, no S3/S4, no murmur.  1+ ankle edema.  No carotid bruit.  Unable to palpate pedal pulses.  Abdomen: Soft, nontender, no hepatosplenomegaly, no distention.  Skin: Intact without lesions or rashes.  Neurologic: Alert and oriented x 3.  Psych: Normal affect. Extremities: No  clubbing or cyanosis.  HEENT: Normal.   Assessment/Plan: 1. Chronic diastolic CHF with prominent RV failure: Moderately dilated RV with moderately decreased systolic function on 7/80 echo.   [place] in 3/19 showed normal right and left heart filling pressures.   Chronic NYHA class III symptoms.  Suspect this is from a combination of CHF, COPD and IPF.  Weight has trended down and she does not appear volume overloaded.  - Continue Lasix 120 qam/80 qpm alternating with Lasix 80 mg bid. BMET today.  - Continue spironolactone 25 mg daily.   - I will see if we can get her side-zipping compression stockings.  2. Pulmonary hypertension: PASP 74 mmHg on 1/19 echo with appearance of cor pulmonale.  CTA chest in 1/19 showed chronic fibrotic changes in the lungs but not emphysema, IPF diagnosed by Dr. Lake Bells.  However, PFTs in 3/19 were suggestive of moderate-severe obstruction consistent with COPD.  RHC (3/19) showed moderate pulmonary arterial hypertension.  V/Q scan showed no evidence for chronic pulmonary embolus.  My suspicion is that her pulmonary hypertension is primarily group 3 due to COPD and idiopathic pulmonary fibrosis.  She has OSA as well.  - I am not inclined to treat with pulmonary vasodilators, Dr Lake Bells has agreed with this.   - Use home oxygen.  - Continue CPAP.  3. Pulmonary fibrosis: IPF.  She is now on Ofev.  She does not see much of a difference. - She has another high resolution chest CT planned to follow progression.  4. CAD: h/o BMS to RCA in 2004.  LHC in 3/19 showed 70% in-stent restenosis in the RCA.  With no chest  pain, plan to treat medically.  - Continue ASA 81.  - She has extensive vascular disease.  Based on COMPASS study, I have her on rivaroxaban 2.5 mg bid.  - She has not been able to tolerate statins due to myalgias.  I will refer her to lipid clinic for [place].    5. Carotid stenosis: Followed by [facility]. 6. PAD: Peripheral arterial dopplers (3/19) showed occluded left SFA.   She has bilateral leg pain, but pain is not typical for claudication.   - Sees Dr. Oneida Alar, he has so far recommended medical management.  - She has quit smoking.  7. COPD: She has now quit smoking.  8. Gout: Check uric acid.  If high, would recommend PCP start allopurinol after flare has totally resolved (currently on colchicine).    Followup 4 months.    Loralie Champagne 07/13/2019

## 2019-07-14 ENCOUNTER — Telehealth: Payer: Self-pay | Admitting: *Deleted

## 2019-07-14 NOTE — Telephone Encounter (Signed)
A message was left, re: her new patient appointment.

## 2019-07-20 DIAGNOSIS — R062 Wheezing: Secondary | ICD-10-CM | POA: Diagnosis not present

## 2019-07-20 DIAGNOSIS — R2689 Other abnormalities of gait and mobility: Secondary | ICD-10-CM | POA: Diagnosis not present

## 2019-07-20 DIAGNOSIS — I5032 Chronic diastolic (congestive) heart failure: Secondary | ICD-10-CM | POA: Diagnosis not present

## 2019-07-30 DIAGNOSIS — I5032 Chronic diastolic (congestive) heart failure: Secondary | ICD-10-CM | POA: Diagnosis not present

## 2019-07-30 DIAGNOSIS — R2689 Other abnormalities of gait and mobility: Secondary | ICD-10-CM | POA: Diagnosis not present

## 2019-07-30 DIAGNOSIS — J449 Chronic obstructive pulmonary disease, unspecified: Secondary | ICD-10-CM | POA: Diagnosis not present

## 2019-07-30 DIAGNOSIS — R062 Wheezing: Secondary | ICD-10-CM | POA: Diagnosis not present

## 2019-08-18 ENCOUNTER — Telehealth: Payer: Self-pay | Admitting: Pulmonary Disease

## 2019-08-18 NOTE — Telephone Encounter (Signed)
Left message for patient to call back.

## 2019-08-19 DIAGNOSIS — I5032 Chronic diastolic (congestive) heart failure: Secondary | ICD-10-CM | POA: Diagnosis not present

## 2019-08-19 DIAGNOSIS — R2689 Other abnormalities of gait and mobility: Secondary | ICD-10-CM | POA: Diagnosis not present

## 2019-08-19 DIAGNOSIS — R062 Wheezing: Secondary | ICD-10-CM | POA: Diagnosis not present

## 2019-08-23 DIAGNOSIS — M109 Gout, unspecified: Secondary | ICD-10-CM | POA: Diagnosis not present

## 2019-08-23 DIAGNOSIS — J84112 Idiopathic pulmonary fibrosis: Secondary | ICD-10-CM | POA: Diagnosis not present

## 2019-08-23 DIAGNOSIS — Z5181 Encounter for therapeutic drug level monitoring: Secondary | ICD-10-CM | POA: Diagnosis not present

## 2019-08-23 NOTE — Telephone Encounter (Signed)
Left message for patient to call back x2.

## 2019-08-24 NOTE — Telephone Encounter (Signed)
I called pt but she nformed me that she would call me back to get the number for the [place] program. She stated she was layng down and didn't have a pen to write down the number. She will return call.

## 2019-08-24 NOTE — Telephone Encounter (Signed)
Pt has returned call & can be reached at [phone].

## 2019-08-24 NOTE — Telephone Encounter (Signed)
Spoke with patient and provided her the number to the [place] program.   While on the phone, she stated that she was told someone would call her to get her scheduled with Dr. Vaughan Browner since BQ is no longer seeing patients in the office. Was able to get patient scheduled for a 30 min appt on 09/27/19. She is aware of appointment and location. Nothing further needed at time of call.

## 2019-09-02 ENCOUNTER — Telehealth: Payer: Self-pay | Admitting: Internal Medicine

## 2019-09-02 NOTE — Telephone Encounter (Signed)
New message   Patient states that her daughter will be coming to appt on Monday she is on a walker. Please advise.

## 2019-09-02 NOTE — Telephone Encounter (Signed)
Spoke with patient and explained visitor policy. She will just need her daughter to help her get to office area and check in. Advised staff can assist patient to exam room area and daughter can be called during visit if she would like her to be aware of patient & Dr. Lysbeth Penner conversation. Patient voiced understanding.

## 2019-09-06 ENCOUNTER — Ambulatory Visit: Payer: PPO | Admitting: Internal Medicine

## 2019-09-08 ENCOUNTER — Other Ambulatory Visit (HOSPITAL_COMMUNITY): Payer: Self-pay | Admitting: Cardiology

## 2019-09-19 DIAGNOSIS — I5032 Chronic diastolic (congestive) heart failure: Secondary | ICD-10-CM | POA: Diagnosis not present

## 2019-09-19 DIAGNOSIS — R062 Wheezing: Secondary | ICD-10-CM | POA: Diagnosis not present

## 2019-09-19 DIAGNOSIS — R2689 Other abnormalities of gait and mobility: Secondary | ICD-10-CM | POA: Diagnosis not present

## 2019-09-23 ENCOUNTER — Telehealth: Payer: Self-pay | Admitting: Cardiology

## 2019-09-23 NOTE — Telephone Encounter (Signed)
Contacted patient to explain why she has multiple cardiologist. Durward Fortes that Aundra Dubin is her specialist for Pulmonary HTN and HF & [facility] manages CAD. Patient verbalized understanding.

## 2019-09-23 NOTE — Telephone Encounter (Signed)
I called patient trying to schedule her over due follow up with Dr Domenic Polite.  She is questioning if she needs to see Dr Domenic Polite and Dr Marigene Ehlers

## 2019-09-27 ENCOUNTER — Ambulatory Visit: Payer: PPO | Admitting: Pulmonary Disease

## 2019-10-01 ENCOUNTER — Other Ambulatory Visit: Payer: Self-pay | Admitting: Pulmonary Disease

## 2019-10-01 NOTE — Telephone Encounter (Signed)
Called and spoke with pt. Pt stated she was told by her insurance that the ofev was requiring an authorization to be done prior to her being able to receive a new Rx from [facility].  Rachael, is there any way you can help out with this please?

## 2019-10-01 NOTE — Telephone Encounter (Signed)
Pt calling to get an authorization for her ofev. Pt can be reached at [phone]

## 2019-10-01 NOTE — Telephone Encounter (Signed)
[employer] - calling to check on PA for Ofev - please return to call to [phone]

## 2019-10-04 NOTE — Telephone Encounter (Signed)
Called [employer], there is already an approval on file for Ofev 165m. Ran test claim and claim paid.  [facility] Authorization has been APPROVED from 10/03/19 to 10/02/20.   Authorization # [id number] # [id number] [facility] and Patient to advise.  9:03 AM RaBeatriz ChancellorCPhT

## 2019-10-04 NOTE — Telephone Encounter (Signed)
Called and spoke with pt to confirm that she did receive a call from [place] and pt stated she did. Stated to pt she should be able to call [facility] to schedule shipment of med when needed and pt verbalized understanding. Nothing further needed.

## 2019-10-06 ENCOUNTER — Encounter: Payer: Self-pay | Admitting: Adult Health

## 2019-10-11 ENCOUNTER — Ambulatory Visit (HOSPITAL_COMMUNITY)
Admission: RE | Admit: 2019-10-11 | Discharge: 2019-10-11 | Disposition: A | Discharge status: Home / Self Care | Payer: PPO | Source: Ambulatory Visit | Attending: Adult Health | Admitting: Adult Health

## 2019-10-11 ENCOUNTER — Other Ambulatory Visit: Payer: Self-pay

## 2019-10-11 DIAGNOSIS — J84112 Idiopathic pulmonary fibrosis: Secondary | ICD-10-CM | POA: Diagnosis not present

## 2019-10-11 DIAGNOSIS — J8410 Pulmonary fibrosis, unspecified: Secondary | ICD-10-CM | POA: Diagnosis not present

## 2019-10-11 DIAGNOSIS — J439 Emphysema, unspecified: Secondary | ICD-10-CM | POA: Diagnosis not present

## 2019-10-18 ENCOUNTER — Ambulatory Visit: Payer: PPO | Admitting: Pulmonary Disease

## 2019-10-18 ENCOUNTER — Encounter: Payer: Self-pay | Admitting: Pulmonary Disease

## 2019-10-18 ENCOUNTER — Other Ambulatory Visit: Payer: Self-pay

## 2019-10-18 VITALS — BP 120/74 | HR 64 | Temp 97.1°F | Ht 65.0 in | Wt 186.6 lb

## 2019-10-18 DIAGNOSIS — Z5181 Encounter for therapeutic drug level monitoring: Secondary | ICD-10-CM | POA: Diagnosis not present

## 2019-10-18 DIAGNOSIS — G4733 Obstructive sleep apnea (adult) (pediatric): Secondary | ICD-10-CM | POA: Diagnosis not present

## 2019-10-18 DIAGNOSIS — Z9989 Dependence on other enabling machines and devices: Secondary | ICD-10-CM | POA: Diagnosis not present

## 2019-10-18 DIAGNOSIS — J84112 Idiopathic pulmonary fibrosis: Secondary | ICD-10-CM | POA: Diagnosis not present

## 2019-10-18 LAB — COMPREHENSIVE METABOLIC PANEL
ALT: 17 U/L (ref 0–35)
AST: 18 U/L (ref 0–37)
Albumin: 3.9 g/dL (ref 3.5–5.2)
Alkaline Phosphatase: 64 U/L (ref 39–117)
BUN: 24 mg/dL — ABNORMAL HIGH (ref 6–23)
CO2: 28 mEq/L (ref 19–32)
Calcium: 9.8 mg/dL (ref 8.4–10.5)
Chloride: 104 mEq/L (ref 96–112)
Creatinine, Ser: 1.07 mg/dL (ref 0.40–1.20)
GFR: 49.29 mL/min — ABNORMAL LOW (ref >60.00)
Glucose, Bld: 97 mg/dL (ref 70–99)
Potassium: 4 mEq/L (ref 3.5–5.1)
Sodium: 141 mEq/L (ref 135–145)
Total Bilirubin: 0.6 mg/dL (ref 0.2–1.2)
Total Protein: 6.3 g/dL (ref 6.0–8.3)

## 2019-10-18 LAB — CBC
HCT: 37.9 % (ref 36.0–46.0)
Hemoglobin: 12.3 g/dL (ref 12.0–15.0)
MCHC: 32.3 g/dL (ref 30.0–36.0)
MCV: 103.2 fl — ABNORMAL HIGH (ref 78.0–100.0)
Platelets: 191 10*3/uL (ref 150.0–400.0)
RBC: 3.68 Mil/uL — ABNORMAL LOW (ref 3.87–5.11)
RDW: 15.4 % (ref 11.5–15.5)
WBC: 5.3 10*3/uL (ref 4.0–10.5)

## 2019-10-18 NOTE — Progress Notes (Addendum)
Shelley [name]    [id number]    1941-08-17  Primary Care 24, Nicole Kindred, MD  Referring Physician: Sandi Mealy, MD [address],  [place] [address]  Chief complaint: Follow-up for COPD, IPF, OSA, pulmonary hypertension  HPI: 79 year old with multiple medical issues including coronary artery disease, GERD, Crohn's disease on sulfasalazine, prior DVT, melanoma History followed by Dr. Lake Bells for COPD, IPF.  She also has group 3 pulmonary hypertension followed by Dr. Aundra Dubin, right heart cath in 2019.  Crohn's disease is treated with sulfasalazine and is stable.  Maintained on Ofev for the past 2 years.  Initially she had some GI symptoms on therapy but since has stabilized with no issues. Significant dyspnea on exertion and poor exercise capacity that is unchanged  Pets: Cats, dogs.  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  No mold, hot tub, Jacuzzi.  No down pillows or comforter Smoking history: 40-pack-year smoker.  Quit in March 2019 Travel history: No significant travel history Relevant family history: Granddaughter had cystic fibrosis  Outpatient Encounter Medications as of 10/18/2019  Medication Sig  . albuterol (PROAIR HFA) 108 (90 Base) MCG/ACT inhaler Inhale 2 puffs into the lungs every 4 (four) hours as needed for wheezing or shortness of breath.  Marland Kitchen aspirin 81 MG tablet Take 81 mg by mouth daily.    . calcium citrate-vitamin D (CITRACAL+D) 315-200 MG-UNIT tablet Take 1 tablet by mouth daily.   . colchicine 0.6 MG tablet Take 0.6 mg by mouth daily.   . ferrous sulfate 325 (65 FE) MG tablet Take 325 mg by mouth daily.  Marland Kitchen FLUoxetine (PROZAC) 40 MG capsule Take 40 mg by mouth daily.  . folic acid (FOLVITE) 1 MG tablet TAKE 1 TABLET BY MOUTH EVERY DAY  . furosemide (LASIX) 40 MG tablet Take 2 tabs Twice daily every  other day ALTERNATING with 3 tabs in AM and 2 tabs in PM every other day  . HYDROcodone-acetaminophen (NORCO) 10-325 MG tablet Take 1 tablet by mouth every 6 (six) hours as needed for moderate pain.   . metoprolol succinate (TOPROL-XL) 25 MG 24 hr tablet TAKE 1 TABLET BY MOUTH EVERY DAY  . Misc Natural Products (OSTEO BI-FLEX JOINT SHIELD PO) Take 1 capsule by mouth daily.  . Nintedanib (OFEV) 100 MG CAPS Take 1 capsule (100 mg total) by mouth 2 (two) times daily.  . nitroGLYCERIN (NITROSTAT) 0.4 MG SL tablet Place 1 tablet (0.4 mg total) under the tongue every 5 (five) minutes x 3 doses as needed.  . Omega-3 Fatty Acids (FISH OIL) 1200 MG CAPS Take 1,200 mg by mouth daily.   . potassium chloride SA (K-DUR) 20 MEQ tablet Take 1 tablet (20 mEq total) by mouth 2 (two) times daily.  Marland Kitchen spironolactone (ALDACTONE) 25 MG tablet Take 1 tablet (25 mg total) by mouth daily.  Marland Kitchen sulfaSALAzine (AZULFIDINE) 500 MG tablet Take 1 tablet (500 mg total) by mouth 2 (two) times daily.  Alveda Reasons 2.5 MG TABS tablet TAKE 1 TABLET BY MOUTH TWICE DAILY   No facility-administered encounter medications on file as of 10/18/2019.   Physical Exam: Blood pressure 120/74, pulse 64, temperature (!) 97.1 F (36.2 C), temperature source Temporal, height _0  (1.651 m), weight 186 lb 9.6 oz (84.6 kg), SpO2 98 %. Gen:      No acute distress  HEENT:  EOMI, sclera anicteric Neck:     No masses; no thyromegaly Lungs:    Basal crackles CV:         Regular rate and rhythm; no murmurs Abd:      + bowel sounds; soft, non-tender; no palpable masses, no distension Ext:    No edema; adequate peripheral perfusion Skin:      Warm and dry; no rash Neuro: alert and oriented x 3 Psych: normal mood and affect  Data Reviewed: Imaging: V/Q scan 11/05/2017-no PE High-res CT 11/30/2017-pulmonary fibrosis in UIP pattern High-res CT 10/11/2019-stable pulmonary fibrosis in UIP pattern, coronary atherosclerosis, enlarged pulmonary  trunk.  PFTs: 10/29/2017 FVC 2.01 [71%], FEV1 1.28 [61%], F/F 63, TLC 4.19 [80%], DLCO 8.10 [31%] Moderate obstruction with severe diffusion impairment  07/27/2018 FVC 1.75 [62%], FEV1 1.36 [65%], F/F 78, TLC 3.78 [72%], DLCO 6.86 [26%] Mild restriction with severe diffusion impairment.  No obstruction  Labs: CTD serologies 11/12/2017-negative, hypersensitivity panel-negative  Cardiac: Right heart cath 10/30/2017 PA 54/16 (mean 28) PCWP 8,  Cardiac Output (Fick) 5.16, Cardiac Index (Fick) 2.55 PVR 3.9 WU  Echo 11/03/2018-LVEF 55 to 60%, severe pulmonary hypertension with moderately reduced RV systolic function.  Sleep: Home sleep test 03/16/2018 Moderate obstructive sleep apnea with AHI 27.8 and low O2 sat of 75%  Assessment:  IPF Continues on Ofev.  Check CBC, comprehensive metabolic panel for monitoring Schedule pulmonary function test  CT chest shows stable thyroid nodule with previous evaluation in 2019 with biopsy  Stable fibrosis similar to April 2019.  Possible kidney cyst-please discuss with primary care provider if this needs further evaluation. Also atherosclerosis is noted and will need to also be discussed with primary care provider.    COPD, Emphysema on CT scan Has a diagnosis of COPD with some obstruction but does not appear to be impressive with no curvature of the flow loops.  Suspect pulmonary fibrosis and pulmonary hypertension is a major cause of her dyspnea more than COPD Continue albuterol as needed.  Pulmonary hypertension Group 3 secondary to pulmonary fibrosis, OSA Not on systemic vasodilators.  Continue Lasix Follow with Dr. Aundra Dubin  She may be a candidate in future for inhaled treprostinil or trials for inhaled vasodialtors based on recent INCREASE study from NEJM Https://www.nejm.org/doi/full/10.1056/NEJMoa2008470  OSA Download reviewed with good compliance.  Continue current therapy.  Check overnight oximetry to ensure no desats at  night  Plan/Recommendations: Continue Ofev Continue CPAP.  Check overnight oximetry Check comprehensive metabolic panel, CBC  This appointment required 40 minutes of patient care (this includes precharting, chart review, review of results, face-to-face care, etc.).  Marshell Garfinkel MD [facility] 10/18/2019, 10:28 AM  CC: Sandi Mealy, MD

## 2019-10-18 NOTE — Addendum Note (Signed)
Addended by: Hildred Alamin I on: 10/18/2019 01:37 PM   Modules accepted: Orders

## 2019-10-18 NOTE — Patient Instructions (Addendum)
Continue the Ofev Recheck labs today including comprehensive metabolic panel, CBC Follow-up in 3 months.

## 2019-10-20 ENCOUNTER — Telehealth: Payer: Self-pay | Admitting: Pulmonary Disease

## 2019-10-20 DIAGNOSIS — R062 Wheezing: Secondary | ICD-10-CM | POA: Diagnosis not present

## 2019-10-20 DIAGNOSIS — I5032 Chronic diastolic (congestive) heart failure: Secondary | ICD-10-CM | POA: Diagnosis not present

## 2019-10-20 DIAGNOSIS — R2689 Other abnormalities of gait and mobility: Secondary | ICD-10-CM | POA: Diagnosis not present

## 2019-10-20 NOTE — Telephone Encounter (Signed)
Called and spoke with Patient. Patient is concerned with any new refills, or prescriptions for Ofev, needing to reapply for assistance.  Patient stated she gets assistance through wellness organization, and she can not afford Ofev, without it.  Message routed to pharmacy team to follow up with Patient and Ofev

## 2019-10-25 NOTE — Telephone Encounter (Signed)
Returned patient's call, advised her that her [place] is still active through 12/20/19. Patient's remaining balance is $824. Patient provided household size and income. Will look into other grant foundations for patient and will call her back.  10:17 AM Shelley [name], CPhT

## 2019-11-01 ENCOUNTER — Ambulatory Visit (HOSPITAL_COMMUNITY)
Admission: RE | Admit: 2019-11-01 | Discharge: 2019-11-01 | Disposition: A | Discharge status: Home / Self Care | Payer: PPO | Source: Ambulatory Visit | Attending: Cardiology | Admitting: Cardiology

## 2019-11-01 ENCOUNTER — Other Ambulatory Visit: Payer: Self-pay

## 2019-11-01 ENCOUNTER — Encounter (HOSPITAL_COMMUNITY): Payer: Self-pay | Admitting: Cardiology

## 2019-11-01 VITALS — BP 114/70 | HR 52 | Wt 190.2 lb

## 2019-11-01 DIAGNOSIS — Z7982 Long term (current) use of aspirin: Secondary | ICD-10-CM | POA: Insufficient documentation

## 2019-11-01 DIAGNOSIS — Z955 Presence of coronary angioplasty implant and graft: Secondary | ICD-10-CM | POA: Insufficient documentation

## 2019-11-01 DIAGNOSIS — M79605 Pain in left leg: Secondary | ICD-10-CM | POA: Diagnosis not present

## 2019-11-01 DIAGNOSIS — Z86718 Personal history of other venous thrombosis and embolism: Secondary | ICD-10-CM | POA: Diagnosis not present

## 2019-11-01 DIAGNOSIS — I5032 Chronic diastolic (congestive) heart failure: Secondary | ICD-10-CM | POA: Diagnosis not present

## 2019-11-01 DIAGNOSIS — Z833 Family history of diabetes mellitus: Secondary | ICD-10-CM | POA: Insufficient documentation

## 2019-11-01 DIAGNOSIS — I110 Hypertensive heart disease with heart failure: Secondary | ICD-10-CM | POA: Diagnosis not present

## 2019-11-01 DIAGNOSIS — Z85820 Personal history of malignant melanoma of skin: Secondary | ICD-10-CM | POA: Insufficient documentation

## 2019-11-01 DIAGNOSIS — Z7901 Long term (current) use of anticoagulants: Secondary | ICD-10-CM | POA: Insufficient documentation

## 2019-11-01 DIAGNOSIS — Z96651 Presence of right artificial knee joint: Secondary | ICD-10-CM | POA: Insufficient documentation

## 2019-11-01 DIAGNOSIS — Z8049 Family history of malignant neoplasm of other genital organs: Secondary | ICD-10-CM | POA: Insufficient documentation

## 2019-11-01 DIAGNOSIS — K5090 Crohn's disease, unspecified, without complications: Secondary | ICD-10-CM | POA: Diagnosis not present

## 2019-11-01 DIAGNOSIS — I2510 Atherosclerotic heart disease of native coronary artery without angina pectoris: Secondary | ICD-10-CM | POA: Insufficient documentation

## 2019-11-01 DIAGNOSIS — I2721 Secondary pulmonary arterial hypertension: Secondary | ICD-10-CM | POA: Diagnosis not present

## 2019-11-01 DIAGNOSIS — M79604 Pain in right leg: Secondary | ICD-10-CM | POA: Insufficient documentation

## 2019-11-01 DIAGNOSIS — R0902 Hypoxemia: Secondary | ICD-10-CM | POA: Diagnosis not present

## 2019-11-01 DIAGNOSIS — J449 Chronic obstructive pulmonary disease, unspecified: Secondary | ICD-10-CM | POA: Diagnosis not present

## 2019-11-01 DIAGNOSIS — Z87891 Personal history of nicotine dependence: Secondary | ICD-10-CM | POA: Insufficient documentation

## 2019-11-01 DIAGNOSIS — F329 Major depressive disorder, single episode, unspecified: Secondary | ICD-10-CM | POA: Insufficient documentation

## 2019-11-01 DIAGNOSIS — J84112 Idiopathic pulmonary fibrosis: Secondary | ICD-10-CM | POA: Diagnosis not present

## 2019-11-01 DIAGNOSIS — Z8673 Personal history of transient ischemic attack (TIA), and cerebral infarction without residual deficits: Secondary | ICD-10-CM | POA: Insufficient documentation

## 2019-11-01 DIAGNOSIS — G4733 Obstructive sleep apnea (adult) (pediatric): Secondary | ICD-10-CM | POA: Diagnosis not present

## 2019-11-01 DIAGNOSIS — I739 Peripheral vascular disease, unspecified: Secondary | ICD-10-CM | POA: Diagnosis not present

## 2019-11-01 DIAGNOSIS — M109 Gout, unspecified: Secondary | ICD-10-CM | POA: Diagnosis not present

## 2019-11-01 DIAGNOSIS — E785 Hyperlipidemia, unspecified: Secondary | ICD-10-CM | POA: Insufficient documentation

## 2019-11-01 DIAGNOSIS — Z8249 Family history of ischemic heart disease and other diseases of the circulatory system: Secondary | ICD-10-CM | POA: Insufficient documentation

## 2019-11-01 DIAGNOSIS — I272 Pulmonary hypertension, unspecified: Secondary | ICD-10-CM | POA: Insufficient documentation

## 2019-11-01 DIAGNOSIS — Z803 Family history of malignant neoplasm of breast: Secondary | ICD-10-CM | POA: Insufficient documentation

## 2019-11-01 DIAGNOSIS — Z8349 Family history of other endocrine, nutritional and metabolic diseases: Secondary | ICD-10-CM | POA: Insufficient documentation

## 2019-11-01 DIAGNOSIS — Z79899 Other long term (current) drug therapy: Secondary | ICD-10-CM | POA: Insufficient documentation

## 2019-11-01 DIAGNOSIS — I4510 Unspecified right bundle-branch block: Secondary | ICD-10-CM | POA: Diagnosis not present

## 2019-11-01 LAB — BASIC METABOLIC PANEL
Anion gap: 8 (ref 5–15)
BUN: 29 mg/dL — ABNORMAL HIGH (ref 8–23)
CO2: 27 mmol/L (ref 22–32)
Calcium: 9.4 mg/dL (ref 8.9–10.3)
Chloride: 105 mmol/L (ref 98–111)
Creatinine, Ser: 1.16 mg/dL — ABNORMAL HIGH (ref 0.44–1.00)
GFR calc Af Amer: 51 mL/min — ABNORMAL LOW (ref >60)
GFR calc non Af Amer: 44 mL/min — ABNORMAL LOW (ref >60)
Glucose, Bld: 107 mg/dL — ABNORMAL HIGH (ref 70–99)
Potassium: 4.3 mmol/L (ref 3.5–5.1)
Sodium: 140 mmol/L (ref 135–145)

## 2019-11-01 MED ORDER — FUROSEMIDE 80 MG PO TABS: ORAL_TABLET | ORAL | 5 refills | Status: DC

## 2019-11-01 NOTE — Patient Instructions (Signed)
INCREASE Lasix to 124m (1.5 tabs) in the morning and 864m(1 tab) in the evening    Labs today We will only contact you if something comes back abnormal or we need to make some changes. Otherwise no news is good news!   Repeat labs in 10 days   Call to make an appointment with the Vascular Vein Specialist.    Your physician has requested that you have an echocardiogram. Echocardiography is a painless test that uses sound waves to create images of your heart. It provides your doctor with information about the size and shape of your heart and how well your heart's chambers and valves are working. This procedure takes approximately one hour. There are no restrictions for this procedure.   Your physician recommends that you schedule a follow-up appointment in: 6 weeks with Dr McAundra Dubin   Please call office at [phone] 2 if you have any questions or concerns.    At the [facility] and your health needs are our priority. As part of our continuing mission to provide you with exceptional heart care, we have created designated Provider Care Teams. These Care Teams include your primary Cardiologist (physician) and Advanced Practice Providers (APPs- Physician Assistants and Nurse Practitioners) who all work together to provide you with the care you need, when you need it.   You may see any of the following providers on your designated Care Team at your next follow up: . Marland Kitchenr DaGlori Bickers Dr DaLoralie Champagne AmDarrick GrinderNP . BrLyda JesterPA . LaAudry RilesPharmD   Please be sure to bring in all your medications bottles to every appointment.

## 2019-11-01 NOTE — Progress Notes (Signed)
PCP: Dr. Lorra Hals Cardiology: Dr. Domenic Polite HF Cardiology: Dr. Aundra Dubin  79 y.o. with history of prior DVT, CAD, HTN was referred by Dr. Domenic Polite for evaluation of pulmonary hypertension/RV failure.  Patient was a long-time smoker, quit in 3/19. In 1/19, she began to develop peripheral edema.  This gradually worsened and Lasix was started.   In 3/19, I took her for right/left heart cath.  This showed 70% in-stent restenosis in the RCA, medically managed.  She had normal filling pressures on RHC with moderate pulmonary hypertension.  V/Q scan showed no chronic PE and PFTs showed moderate to severe obstructive defect.   She saw Dr. Lake Bells with pulmonary, and was diagnosed with both COPD and idiopathic pulmonary fibrosis.  She was started on Ofev.    She returns for followup of CHF and pulmonary hypertension.  She is using home oxygen during the day and CPAP at night.  She has a chronic cough. Weight is up 2 lbs.  She is short of breath walking to the car.  Occasionally short of breath walking in the house but usually ok.  Short of breath with housework.  No chest pain.  Still with chronic diffuse bilateral leg pain.  Unable to get regular compression stockings on.    Labs (1/19): K 3.8, creatinine 0.74 Labs (3/19): K 4.6, creatinine 0.87 Labs (5/19): LDL 52 Labs (7/19): K 4.3, creatinine 0.82 Labs (11/19): LDL 55, TGs 73 Labs (12/19): K 4.1, creatinine 0.87 => 0.99 Labs (6/20): K 3.7, creatinine 0.97 Labs (7/20): K 3.7, creatinine 0.84 Labs (8/20): K 4.1, creatinine 0.94 Labs (2/21): K 4, creatinine 1.07, hgb 12.3  ECG (personally reviewed): NSR, RBBB, inferior Qs   PMH: 1. CAD: BMS to RCA in 2004.  - Cardiolite (12/16): EF 52%, fixed inferior defect with no ischemia.  - LHC (3/19): 70% in-stent restenosis in RCA stent, managed medically.  2. Depression 3. H/o DVT 4. HTN 5. Osteoarthritis: h/o R TKR.  6. H/o melanoma 7. Crohns disease: Sulfasalazine. 8. Hyperlipidemia 9. Carotid  stenosis: Followed at [facility].  - Carotid dopplers (11/18): RICA 09-32% stenosis, LICA 67-12% stenosis.  - Carotid dopplers (45/80): RICA 99-83%, LICA 38-25%.  - Carotid dopplers (6/20): RICA 05-39% stenosis, LICA 76-73% stenosis.  10. COPD: No longer smokes. -  PFTs (3/19): moderate-severe obstruction consistent with COPD. 11. Pulmonary hypertension/RV failure: Suspect primarily group 3.  Echo (1/19) with EF 55-60%, mild LVH, mild MR, moderate RV dilation with moderately decreased systolic function, moderate TR, PASP 74 mmHg.  - CTA chest (1/19): No PE, chronic fibrotic changes in the lungs, 6 mm RUL nodule.  - RHC (3/19): mean RA 2, PA 54/16 mean 28, mean PCWP 8, CI 2.55, PVR 3.9 WU.  - PFTs (3/19): moderate-severe obstruction consistent with COPD.  - V/Q scan (3/19): No evidence for chronic PE.  - Echo (3/20): EF 55-60%, moderately enlarged RV with moderately decreased RV systolic function, PASP 87 mmHg, moderate TR, dilated IVC.  12. TIA  13. PAD: Peripheral arterial dopplers (3/19) with totally occluded right AT, totally occluded left SFA, severe stenosis in left PT.  - ABIs (12/19): 0.73 on left, 1.05 on right.  - ABIs (6/20): Stable compared to prior.  14. Idiopathic pulmonary fibrosis: Followed by Dr. Lake Bells, she is on Ofev.  15. LBBB 16. Multinodular goiter 17. OSA: CPAP.  18. Gout  Social History   Socioeconomic History  . Marital status: Divorced    Spouse name: Not on file  . Number of children: 2  . Years  of education: Not on file  . Highest education level: Not on file  Occupational History  . Occupation: Retired    Fish farm manager: RETIRED  Tobacco Use  . Smoking status: Former Smoker    Packs/day: 1.00    Years: 40.00    Pack years: 40.00    Types: Cigarettes    Start date: 06/02/1957    Quit date: 09/2017    Years since quitting: 2.0  . Smokeless tobacco: Never Used  Substance and Sexual Activity  . Alcohol use: No    Alcohol/week: 0.0 standard drinks  . Drug use:  No  . Sexual activity: Not on file  Other Topics Concern  . Not on file  Social History Narrative  . Not on file   Social Determinants of Health   Financial Resource Strain:   . Difficulty of Paying Living Expenses: Not on file  Food Insecurity:   . Worried About Charity fundraiser in the Last Year: Not on file  . Ran Out of Food in the Last Year: Not on file  Transportation Needs:   . Lack of Transportation (Medical): Not on file  . Lack of Transportation (Non-Medical): Not on file  Physical Activity:   . Days of Exercise per Week: Not on file  . Minutes of Exercise per Session: Not on file  Stress:   . Feeling of Stress : Not on file  Social Connections:   . Frequency of Communication with Friends and Family: Not on file  . Frequency of Social Gatherings with Friends and Family: Not on file  . Attends Religious Services: Not on file  . Active Member of Clubs or Organizations: Not on file  . Attends Archivist Meetings: Not on file  . Marital Status: Not on file  Intimate Partner Violence:   . Fear of Current or Ex-Partner: Not on file  . Emotionally Abused: Not on file  . Physically Abused: Not on file  . Sexually Abused: Not on file   Family History  Problem Relation Age of Onset  . Diabetes type II Mother   . Heart attack Mother 68       questionable  . Uterine cancer Mother   . Heart disease Mother        before age 72  . Varicose Veins Mother   . Other Brother        PVD and hx DVT  . Deep vein thrombosis Brother   . Breast cancer Sister   . Hyperlipidemia Sister   . Hypertension Sister   . Colon cancer Neg Hx    ROS: All systems reviewed and negative except as per HPI.  Current Outpatient Medications  Medication Sig Dispense Refill  . albuterol (PROAIR HFA) 108 (90 Base) MCG/ACT inhaler Inhale 2 puffs into the lungs every 4 (four) hours as needed for wheezing or shortness of breath. 8 g 5  . aspirin 81 MG tablet Take 81 mg by mouth daily.       . calcium citrate-vitamin D (CITRACAL+D) 315-200 MG-UNIT tablet Take 1 tablet by mouth daily.     . colchicine 0.6 MG tablet Take 0.6 mg by mouth daily.     . ferrous sulfate 325 (65 FE) MG tablet Take 325 mg by mouth daily.    Marland Kitchen FLUoxetine (PROZAC) 40 MG capsule Take 40 mg by mouth daily.    . folic acid (FOLVITE) 1 MG tablet TAKE 1 TABLET BY MOUTH EVERY DAY 90 tablet 4  . furosemide (LASIX) 80  MG tablet Take 1.5 tablets (120 mg total) by mouth every morning AND 1 tablet (80 mg total) every evening. 75 tablet 5  . HYDROcodone-acetaminophen (NORCO) 10-325 MG tablet Take 1 tablet by mouth every 6 (six) hours as needed for moderate pain.     . metoprolol succinate (TOPROL-XL) 25 MG 24 hr tablet TAKE 1 TABLET BY MOUTH EVERY DAY 90 tablet 2  . Misc Natural Products (OSTEO BI-FLEX JOINT SHIELD PO) Take 1 capsule by mouth daily.    . Nintedanib (OFEV) 100 MG CAPS Take 1 capsule (100 mg total) by mouth 2 (two) times daily. 60 capsule 11  . nitroGLYCERIN (NITROSTAT) 0.4 MG SL tablet Place 1 tablet (0.4 mg total) under the tongue every 5 (five) minutes x 3 doses as needed. 25 tablet 3  . Omega-3 Fatty Acids (FISH OIL) 1200 MG CAPS Take 1,200 mg by mouth daily.     . potassium chloride SA (K-DUR) 20 MEQ tablet Take 1 tablet (20 mEq total) by mouth 2 (two) times daily. 180 tablet 3  . spironolactone (ALDACTONE) 25 MG tablet Take 1 tablet (25 mg total) by mouth daily. 90 tablet 3  . sulfaSALAzine (AZULFIDINE) 500 MG tablet Take 1 tablet (500 mg total) by mouth 2 (two) times daily. 60 tablet 6  . XARELTO 2.5 MG TABS tablet TAKE 1 TABLET BY MOUTH TWICE DAILY 60 tablet 6   No current facility-administered medications for this encounter.   BP 114/70   Pulse (!) 52   Wt 86.3 kg (190 lb 3.2 oz)   SpO2 100% Comment: 3L of oxygen  BMI 31.65 kg/m  General: NAD Neck: JVP 8-9 cm, no thyromegaly or thyroid nodule.  Lungs: Dry crackles at bases.  CV: Nondisplaced PMI.  Heart regular S1/S2, no S3/S4, no murmur.   1+ ankle edema.  No carotid bruit.  Normal pedal pulses.  Abdomen: Soft, nontender, no hepatosplenomegaly, no distention.  Skin: Intact without lesions or rashes.  Neurologic: Alert and oriented x 3.  Psych: Normal affect. Extremities: No clubbing or cyanosis.  HEENT: Normal.   Assessment/Plan: 1. Chronic diastolic CHF with prominent RV failure: Moderately dilated RV with moderately decreased systolic function on 4/94 echo.   Mitchell in 3/19 showed normal right and left heart filling pressures.   Chronic NYHA class III symptoms.  Suspect this is from a combination of CHF, COPD and IPF.  Weight is up a couple of lbs and she looks at least mildly volume overloaded today.  - Increase Lasix to 120 qam/80 qpm.  BMET today and in 10 days.  If she remains volume overloaded with increased Lasix, will transition to torsemide next visit.   - Continue spironolactone 25 mg daily.   - Need to try to get side-zipping compression stockings.  - Repeat echo at followup.  2. Pulmonary hypertension: PASP 74 mmHg on 1/19 echo with appearance of cor pulmonale.  CTA chest in 1/19 showed chronic fibrotic changes in the lungs but not emphysema, IPF diagnosed by Dr. Lake Bells.  However, PFTs in 3/19 were suggestive of moderate-severe obstruction consistent with COPD.  RHC (3/19) showed moderate pulmonary arterial hypertension.  V/Q scan showed no evidence for chronic pulmonary embolus.  My suspicion is that her pulmonary hypertension is primarily group 3 due to COPD and idiopathic pulmonary fibrosis.  She has OSA as well.  - Once she is euvolemic, think it would be reasonable to consider treatment with inhaled Treprostinil based on INCREASE study.    - Use home oxygen.  -  Continue CPAP.  3. Pulmonary fibrosis: IPF.  She is now on Ofev.  - Dr. Vaughan Browner is following now.  4. CAD: h/o BMS to RCA in 2004.  LHC in 3/19 showed 70% in-stent restenosis in the RCA.  With no chest pain, plan to treat medically.  - Continue ASA 81.  -  She has extensive vascular disease.  Based on COMPASS study, I have her on rivaroxaban 2.5 mg bid.  - She has not been able to tolerate statins due to myalgias.  She has an appointment this month in lipid clinic to get started with Repatha.     5. Carotid stenosis: Followed by [facility]. 6. PAD: Peripheral arterial dopplers (3/19) showed occluded left SFA.  She has bilateral leg pain, but pain is not typical for claudication.   - Sees Dr. Oneida Alar, he has so far recommended medical management. She is due for an appointment and will call.  - She has quit smoking.  7. COPD: She has now quit smoking.  8. Carotid stenosis: Followed at [facility], due for appt with carotid dopplers.   Followup 6 wks with echo. Will need to consider use of Tyvaso, probably will need repeat RHC.    Loralie Champagne 11/01/2019

## 2019-11-03 NOTE — Telephone Encounter (Signed)
Patient has been approved for PAF PF copay assistance grant. Approval is through 11/02/20.  Fund: Pulmonary Fibrosis Award Period: - 11/02/2020 Cardholder: [id number] BIN: [id number] PCN: [id number] Group: [id number] For pharmacy inquiries, contact PDMI at [phone]. For patient inquiries, contact PAF at [phone].  Called patient and advised.  10:42 AM Beatriz Chancellor, CPhT

## 2019-11-09 ENCOUNTER — Other Ambulatory Visit: Payer: Self-pay | Admitting: Cardiology

## 2019-11-16 ENCOUNTER — Other Ambulatory Visit (HOSPITAL_COMMUNITY)
Admission: RE | Admit: 2019-11-16 | Discharge: 2019-11-16 | Disposition: A | Discharge status: Home / Self Care | Payer: PPO | Source: Ambulatory Visit | Attending: Cardiology | Admitting: Cardiology

## 2019-11-16 DIAGNOSIS — I5032 Chronic diastolic (congestive) heart failure: Secondary | ICD-10-CM | POA: Insufficient documentation

## 2019-11-16 DIAGNOSIS — I2721 Secondary pulmonary arterial hypertension: Secondary | ICD-10-CM | POA: Diagnosis not present

## 2019-11-16 LAB — BASIC METABOLIC PANEL
Anion gap: 11 (ref 5–15)
BUN: 26 mg/dL — ABNORMAL HIGH (ref 8–23)
CO2: 25 mmol/L (ref 22–32)
Calcium: 9.2 mg/dL (ref 8.9–10.3)
Chloride: 104 mmol/L (ref 98–111)
Creatinine, Ser: 1.15 mg/dL — ABNORMAL HIGH (ref 0.44–1.00)
GFR calc Af Amer: 52 mL/min — ABNORMAL LOW (ref >60)
GFR calc non Af Amer: 45 mL/min — ABNORMAL LOW (ref >60)
Glucose, Bld: 110 mg/dL — ABNORMAL HIGH (ref 70–99)
Potassium: 3.6 mmol/L (ref 3.5–5.1)
Sodium: 140 mmol/L (ref 135–145)

## 2019-11-17 DIAGNOSIS — I5032 Chronic diastolic (congestive) heart failure: Secondary | ICD-10-CM | POA: Diagnosis not present

## 2019-11-17 DIAGNOSIS — R062 Wheezing: Secondary | ICD-10-CM | POA: Diagnosis not present

## 2019-11-17 DIAGNOSIS — R2689 Other abnormalities of gait and mobility: Secondary | ICD-10-CM | POA: Diagnosis not present

## 2019-11-22 ENCOUNTER — Ambulatory Visit: Payer: PPO

## 2019-12-02 DIAGNOSIS — I5032 Chronic diastolic (congestive) heart failure: Secondary | ICD-10-CM | POA: Diagnosis not present

## 2019-12-02 DIAGNOSIS — R2689 Other abnormalities of gait and mobility: Secondary | ICD-10-CM | POA: Diagnosis not present

## 2019-12-02 DIAGNOSIS — R062 Wheezing: Secondary | ICD-10-CM | POA: Diagnosis not present

## 2019-12-10 ENCOUNTER — Other Ambulatory Visit (HOSPITAL_COMMUNITY): Payer: Self-pay | Admitting: Cardiology

## 2019-12-20 ENCOUNTER — Other Ambulatory Visit: Payer: Self-pay

## 2019-12-20 ENCOUNTER — Encounter (HOSPITAL_COMMUNITY): Payer: Self-pay | Admitting: Cardiology

## 2019-12-20 ENCOUNTER — Ambulatory Visit (HOSPITAL_BASED_OUTPATIENT_CLINIC_OR_DEPARTMENT_OTHER)
Admission: RE | Admit: 2019-12-20 | Discharge: 2019-12-20 | Disposition: A | Discharge status: Home / Self Care | Payer: PPO | Source: Ambulatory Visit | Attending: Cardiology | Admitting: Cardiology

## 2019-12-20 ENCOUNTER — Ambulatory Visit (HOSPITAL_COMMUNITY)
Admission: RE | Admit: 2019-12-20 | Discharge: 2019-12-20 | Disposition: A | Discharge status: Home / Self Care | Payer: PPO | Source: Ambulatory Visit | Attending: Cardiology | Admitting: Cardiology

## 2019-12-20 VITALS — BP 120/60 | HR 51 | Wt 180.8 lb

## 2019-12-20 DIAGNOSIS — Z833 Family history of diabetes mellitus: Secondary | ICD-10-CM | POA: Insufficient documentation

## 2019-12-20 DIAGNOSIS — Z8249 Family history of ischemic heart disease and other diseases of the circulatory system: Secondary | ICD-10-CM | POA: Insufficient documentation

## 2019-12-20 DIAGNOSIS — K5090 Crohn's disease, unspecified, without complications: Secondary | ICD-10-CM | POA: Diagnosis not present

## 2019-12-20 DIAGNOSIS — J449 Chronic obstructive pulmonary disease, unspecified: Secondary | ICD-10-CM | POA: Insufficient documentation

## 2019-12-20 DIAGNOSIS — Z7982 Long term (current) use of aspirin: Secondary | ICD-10-CM | POA: Insufficient documentation

## 2019-12-20 DIAGNOSIS — I5032 Chronic diastolic (congestive) heart failure: Secondary | ICD-10-CM | POA: Insufficient documentation

## 2019-12-20 DIAGNOSIS — J84112 Idiopathic pulmonary fibrosis: Secondary | ICD-10-CM | POA: Diagnosis not present

## 2019-12-20 DIAGNOSIS — Z803 Family history of malignant neoplasm of breast: Secondary | ICD-10-CM | POA: Insufficient documentation

## 2019-12-20 DIAGNOSIS — I252 Old myocardial infarction: Secondary | ICD-10-CM | POA: Insufficient documentation

## 2019-12-20 DIAGNOSIS — E785 Hyperlipidemia, unspecified: Secondary | ICD-10-CM | POA: Diagnosis not present

## 2019-12-20 DIAGNOSIS — Z955 Presence of coronary angioplasty implant and graft: Secondary | ICD-10-CM | POA: Diagnosis not present

## 2019-12-20 DIAGNOSIS — Z8049 Family history of malignant neoplasm of other genital organs: Secondary | ICD-10-CM | POA: Diagnosis not present

## 2019-12-20 DIAGNOSIS — Z85820 Personal history of malignant melanoma of skin: Secondary | ICD-10-CM | POA: Diagnosis not present

## 2019-12-20 DIAGNOSIS — G4733 Obstructive sleep apnea (adult) (pediatric): Secondary | ICD-10-CM | POA: Insufficient documentation

## 2019-12-20 DIAGNOSIS — Z87891 Personal history of nicotine dependence: Secondary | ICD-10-CM | POA: Diagnosis not present

## 2019-12-20 DIAGNOSIS — F329 Major depressive disorder, single episode, unspecified: Secondary | ICD-10-CM | POA: Insufficient documentation

## 2019-12-20 DIAGNOSIS — Z79899 Other long term (current) drug therapy: Secondary | ICD-10-CM | POA: Insufficient documentation

## 2019-12-20 DIAGNOSIS — I739 Peripheral vascular disease, unspecified: Secondary | ICD-10-CM | POA: Diagnosis not present

## 2019-12-20 DIAGNOSIS — M109 Gout, unspecified: Secondary | ICD-10-CM | POA: Insufficient documentation

## 2019-12-20 DIAGNOSIS — Z8349 Family history of other endocrine, nutritional and metabolic diseases: Secondary | ICD-10-CM | POA: Insufficient documentation

## 2019-12-20 DIAGNOSIS — I2721 Secondary pulmonary arterial hypertension: Secondary | ICD-10-CM | POA: Diagnosis not present

## 2019-12-20 DIAGNOSIS — I110 Hypertensive heart disease with heart failure: Secondary | ICD-10-CM | POA: Diagnosis not present

## 2019-12-20 DIAGNOSIS — Z86718 Personal history of other venous thrombosis and embolism: Secondary | ICD-10-CM | POA: Diagnosis not present

## 2019-12-20 DIAGNOSIS — I2510 Atherosclerotic heart disease of native coronary artery without angina pectoris: Secondary | ICD-10-CM | POA: Diagnosis not present

## 2019-12-20 DIAGNOSIS — Z8673 Personal history of transient ischemic attack (TIA), and cerebral infarction without residual deficits: Secondary | ICD-10-CM | POA: Diagnosis not present

## 2019-12-20 DIAGNOSIS — Z7901 Long term (current) use of anticoagulants: Secondary | ICD-10-CM | POA: Insufficient documentation

## 2019-12-20 DIAGNOSIS — Z96651 Presence of right artificial knee joint: Secondary | ICD-10-CM | POA: Insufficient documentation

## 2019-12-20 LAB — BASIC METABOLIC PANEL
Anion gap: 12 (ref 5–15)
BUN: 28 mg/dL — ABNORMAL HIGH (ref 8–23)
CO2: 25 mmol/L (ref 22–32)
Calcium: 9.8 mg/dL (ref 8.9–10.3)
Chloride: 103 mmol/L (ref 98–111)
Creatinine, Ser: 1.21 mg/dL — ABNORMAL HIGH (ref 0.44–1.00)
GFR calc Af Amer: 49 mL/min — ABNORMAL LOW (ref >60)
GFR calc non Af Amer: 42 mL/min — ABNORMAL LOW (ref >60)
Glucose, Bld: 88 mg/dL (ref 70–99)
Potassium: 4 mmol/L (ref 3.5–5.1)
Sodium: 140 mmol/L (ref 135–145)

## 2019-12-20 MED ORDER — TORSEMIDE 20 MG PO TABS: 80.0000 mg | ORAL_TABLET | Freq: Every day | ORAL | 5 refills | Status: AC

## 2019-12-20 MED ORDER — PREDNISONE 10 MG PO TABS: ORAL_TABLET | ORAL | 0 refills | Status: DC

## 2019-12-20 MED ORDER — COLCHICINE 0.6 MG PO TABS: 0.6000 mg | ORAL_TABLET | Freq: Every day | ORAL | 3 refills | Status: DC

## 2019-12-20 NOTE — Progress Notes (Signed)
  Echocardiogram 2D Echocardiogram has been performed.  Shelley [name] 12/20/2019, 10:53 AM

## 2019-12-20 NOTE — Patient Instructions (Addendum)
STOP Lasix (Furosemide)  START Torsemide 63m (4 tabs) daily  START Prednisone Take 417m(4 tabs) for 2 days, then 3021m3 tabs) for 2 days, then 53m77m tabs) for 2 days then 10mg38mtab) for 2 days THEN STOP  START Colchicine 0.6mg (75mab) daily  Labs today and repeat in 10 days at [facility] only contact you if something comes back abnormal or we need to make some changes. Otherwise no news is good news!  Follow up with your PCP regarding your gout  Make an appointment with the Vascular and Vein Specialist  Your physician recommends that you schedule a follow-up appointment in: 3 weeks.  You will get a call to schedule this appointment.     [facility] [id number] [phone]/2021  You are scheduled for a Cardiac Catheterization on Thursday, May 6 with Dr. DaltonLoralie ChampagnePlease arrive at the [facility] Entrance A) at [facility] [id number] AM (This time is two hours before your procedure to ensure your preparation). Free valet parking service is available.   Special note: Every effort is made to have your procedure done on time. Please understand that emergencies sometimes delay scheduled procedures.  2. Diet: Do not eat or drink after midnight  3. Labs: done today in office  You will need a pre procedure COVID test     WHEN:  Tuesday May 4th, 2021 at 11:30AM WHERE: [facility] [id number] is a drive thru testing site, you will remain in your car. Be sure to get in the line FOR PROCEDURES Once you have been swabbed you will need to remain home in quarantine until you return for your procedure.   4. Medication instructions in preparation for your procedure:   Contrast Allergy:  No   *For reference purposes while preparing patient instructions.   Delete this med list prior to printing instructions for patient.*  Stop taking Xarelto (Rivaroxaban) on Tuesday, May 4.  Stop taking, Torsemide (Demadex) Thursday, May 6,   Stop taking  Spironolactone on Thursday May 6th, 2021  Stop taking Omega fish oil on Thursday May 6th, 2021  On the morning of your procedure, take your Aspirin and any morning medicines NOT listed above.  You may use sips of water.  5. Plan for one night stay--bring personal belongings. 6. Bring a current list of your medications and current insurance cards. 7. You MUST have a responsible person to drive you home. 8. Someone MUST be with you the first 24 hours after you arrive home or your discharge will be delayed. 9. Please wear clothes that are easy to get on and off and wear slip-on shoes.  Thank you for allowing us to [facility] for you!   -- [place] [facility]

## 2019-12-20 NOTE — Progress Notes (Signed)
PCP: Dr. Lorra Hals Cardiology: Dr. Domenic Polite HF Cardiology: Dr. Aundra Dubin  79 y.o. with history of prior DVT, CAD, HTN was referred by Dr. Domenic Polite for evaluation of pulmonary hypertension/RV failure.  Patient was a long-time smoker, quit in 3/19. In 1/19, she began to develop peripheral edema.  This gradually worsened and Lasix was started.   In 3/19, I took her for right/left heart cath.  This showed 70% in-stent restenosis in the RCA, medically managed.  She had normal filling pressures on RHC with moderate pulmonary hypertension.  V/Q scan showed no chronic PE and PFTs showed moderate to severe obstructive defect.   She saw Dr. Lake Bells with pulmonary, and was diagnosed with both COPD and idiopathic pulmonary fibrosis.  She was started on Ofev.    Echo was done today and reviewed, EF 55-60%, moderately dilated RV with moderately decreased systolic function, PASP 73 mmHg, moderate TR.   She returns for followup of CHF and pulmonary hypertension.  She is using home oxygen 5L during the day and CPAP at night.  Weight is down 10 lbs.  She remains very short of breath.  She is dyspneic walking around her house.  She also has significant pain in her feet bilaterally that she attributes to gout.  Uric acid in 11/20 was very high (12). No chest pain.  No lightheadedness. She does not have classic claudication-type pain.   Labs (1/19): K 3.8, creatinine 0.74 Labs (3/19): K 4.6, creatinine 0.87 Labs (5/19): LDL 52 Labs (7/19): K 4.3, creatinine 0.82 Labs (11/19): LDL 55, TGs 73 Labs (12/19): K 4.1, creatinine 0.87 => 0.99 Labs (6/20): K 3.7, creatinine 0.97 Labs (7/20): K 3.7, creatinine 0.84 Labs (8/20): K 4.1, creatinine 0.94 Labs (2/21): K 4, creatinine 1.07, hgb 12.3 Labs (3/21): K 3.6, creatinine 1.15  PMH: 1. CAD: BMS to RCA in 2004.  - Cardiolite (12/16): EF 52%, fixed inferior defect with no ischemia.  - LHC (3/19): 70% in-stent restenosis in RCA stent, managed medically.  2. Depression 3.  H/o DVT 4. HTN 5. Osteoarthritis: h/o R TKR.  6. H/o melanoma 7. Crohns disease: Sulfasalazine. 8. Hyperlipidemia 9. Carotid stenosis: Followed at [facility].  - Carotid dopplers (11/18): RICA 60-63% stenosis, LICA 01-60% stenosis.  - Carotid dopplers (10/93): RICA 23-55%, LICA 73-22%.  - Carotid dopplers (6/20): RICA 02-54% stenosis, LICA 27-06% stenosis.  10. COPD: No longer smokes. -  PFTs (3/19): moderate-severe obstruction consistent with COPD. 11. Pulmonary hypertension/RV failure: Suspect primarily group 3.  Echo (1/19) with EF 55-60%, mild LVH, mild MR, moderate RV dilation with moderately decreased systolic function, moderate TR, PASP 74 mmHg.  - CTA chest (1/19): No PE, chronic fibrotic changes in the lungs, 6 mm RUL nodule.  - RHC (3/19): mean RA 2, PA 54/16 mean 28, mean PCWP 8, CI 2.55, PVR 3.9 WU.  - PFTs (3/19): moderate-severe obstruction consistent with COPD.  - V/Q scan (3/19): No evidence for chronic PE.  - Echo (3/20): EF 55-60%, moderately enlarged RV with moderately decreased RV systolic function, PASP 87 mmHg, moderate TR, dilated IVC.  - Echo (4/21): EF 55-60%, moderately dilated RV with moderately decreased systolic function, PASP 73 mmHg, moderate TR.  12. TIA  13. PAD: Peripheral arterial dopplers (3/19) with totally occluded right AT, totally occluded left SFA, severe stenosis in left PT.  - ABIs (12/19): 0.73 on left, 1.05 on right.  - ABIs (6/20): Stable compared to prior.  14. Idiopathic pulmonary fibrosis: Followed by Dr. Lake Bells, she is on Ofev.  15. LBBB  16. Multinodular goiter 17. OSA: CPAP.  18. Gout  Social History   Socioeconomic History  . Marital status: Divorced    Spouse name: Not on file  . Number of children: 2  . Years of education: Not on file  . Highest education level: Not on file  Occupational History  . Occupation: Retired    Fish farm manager: RETIRED  Tobacco Use  . Smoking status: Former Smoker    Packs/day: 1.00    Years: 40.00    Pack  years: 40.00    Types: Cigarettes    Start date: 06/02/1957    Quit date: 09/2017    Years since quitting: 2.2  . Smokeless tobacco: Never Used  Substance and Sexual Activity  . Alcohol use: No    Alcohol/week: 0.0 standard drinks  . Drug use: No  . Sexual activity: Not on file  Other Topics Concern  . Not on file  Social History Narrative  . Not on file   Social Determinants of Health   Financial Resource Strain:   . Difficulty of Paying Living Expenses:   Food Insecurity:   . Worried About Charity fundraiser in the Last Year:   . Arboriculturist in the Last Year:   Transportation Needs:   . Film/video editor (Medical):   Marland Kitchen Lack of Transportation (Non-Medical):   Physical Activity:   . Days of Exercise per Week:   . Minutes of Exercise per Session:   Stress:   . Feeling of Stress :   Social Connections:   . Frequency of Communication with Friends and Family:   . Frequency of Social Gatherings with Friends and Family:   . Attends Religious Services:   . Active Member of Clubs or Organizations:   . Attends Archivist Meetings:   Marland Kitchen Marital Status:   Intimate Partner Violence:   . Fear of Current or Ex-Partner:   . Emotionally Abused:   Marland Kitchen Physically Abused:   . Sexually Abused:    Family History  Problem Relation Age of Onset  . Diabetes type II Mother   . Heart attack Mother 74       questionable  . Uterine cancer Mother   . Heart disease Mother        before age 65  . Varicose Veins Mother   . Other Brother        PVD and hx DVT  . Deep vein thrombosis Brother   . Breast cancer Sister   . Hyperlipidemia Sister   . Hypertension Sister   . Colon cancer Neg Hx    ROS: All systems reviewed and negative except as per HPI.  Current Outpatient Medications  Medication Sig Dispense Refill  . albuterol (PROAIR HFA) 108 (90 Base) MCG/ACT inhaler Inhale 2 puffs into the lungs every 4 (four) hours as needed for wheezing or shortness of breath. 8 g 5   . aspirin 81 MG tablet Take 81 mg by mouth daily.      . calcium citrate-vitamin D (CITRACAL+D) 315-200 MG-UNIT tablet Take 1 tablet by mouth daily.     . colchicine 0.6 MG tablet Take 1 tablet (0.6 mg total) by mouth daily. 30 tablet 3  . ferrous sulfate 325 (65 FE) MG tablet Take 325 mg by mouth daily.    Marland Kitchen FLUoxetine (PROZAC) 40 MG capsule Take 40 mg by mouth daily.    . folic acid (FOLVITE) 1 MG tablet TAKE 1 TABLET BY MOUTH EVERY DAY 90 tablet 4  .  HYDROcodone-acetaminophen (NORCO) 10-325 MG tablet Take 1 tablet by mouth every 6 (six) hours as needed for moderate pain.     . metoprolol succinate (TOPROL-XL) 25 MG 24 hr tablet TAKE 1 TABLET BY MOUTH EVERY DAY 90 tablet 2  . Misc Natural Products (OSTEO BI-FLEX JOINT SHIELD PO) Take 1 capsule by mouth daily.    . Nintedanib (OFEV) 100 MG CAPS Take 1 capsule (100 mg total) by mouth 2 (two) times daily. 60 capsule 11  . nitroGLYCERIN (NITROSTAT) 0.4 MG SL tablet Place 1 tablet (0.4 mg total) under the tongue every 5 (five) minutes x 3 doses as needed. 25 tablet 3  . Omega-3 Fatty Acids (FISH OIL) 1200 MG CAPS Take 1,200 mg by mouth daily.     . potassium chloride SA (K-DUR) 20 MEQ tablet Take 1 tablet (20 mEq total) by mouth 2 (two) times daily. 180 tablet 3  . spironolactone (ALDACTONE) 25 MG tablet TAKE 1 TABLET BY MOUTH EVERY DAY 90 tablet 3  . sulfaSALAzine (AZULFIDINE) 500 MG tablet Take 1 tablet (500 mg total) by mouth 2 (two) times daily. 60 tablet 6  . XARELTO 2.5 MG TABS tablet TAKE 1 TABLET BY MOUTH TWICE DAILY 60 tablet 6  . predniSONE (DELTASONE) 10 MG tablet Take 58m (4 tabs) for 2 days, then 317m(3 tabs) for 2 days, then 2090m2 tabs) for 2 days then 47m57m tab) for 2 days 20 tablet 0  . torsemide (DEMADEX) 20 MG tablet Take 4 tablets (80 mg total) by mouth daily. 120 tablet 5   No current facility-administered medications for this encounter.   BP 120/60   Pulse (!) 51   Wt 82 kg (180 lb 12.8 oz)   SpO2 100% Comment: 3L of  O2  BMI 30.09 kg/m  General: NAD Neck: JVP 8-9 cm, no thyromegaly or thyroid nodule.  Lungs: Dry crackles at bases CV: Nondisplaced PMI.  Heart regular S1/S2, no S3/S4, no murmur.  1+ ankle edema.  No carotid bruit. Difficult to palpate pedal pulses.  Abdomen: Soft, nontender, no hepatosplenomegaly, no distention.  Skin: Intact without lesions or rashes.  Neurologic: Alert and oriented x 3.  Psych: Normal affect. Extremities: No clubbing or cyanosis.  HEENT: Normal.   Assessment/Plan: 1. Chronic diastolic CHF with prominent RV failure: Moderately dilated RV with moderately decreased systolic function on 4/211/61o.   RHC [place]3/19 showed normal right and left heart filling pressures.   Chronic NYHA class IIIb symptoms, somewhat worse recently.  Suspect this is from a combination of CHF, COPD and IPF.  Though weight is down, she looks volume overloaded on exam.  - Stop Lasix, start torsemide 80 mg daily.  BMET today and 10 days.    - Continue spironolactone 25 mg daily.   2. Pulmonary hypertension: PASP 74 mmHg on 1/19 echo with appearance of cor pulmonale.  CTA chest in 1/19 showed chronic fibrotic changes in the lungs but not emphysema, IPF diagnosed by Dr. [place] Bellsowever, PFTs in 3/19 were suggestive of moderate-severe obstruction consistent with COPD.  RHC (3/19) showed moderate pulmonary arterial hypertension.  V/Q scan showed no evidence for chronic pulmonary embolus.  Most recent echo in 4/21 showed moderately dilated/moderately dysfunctional RV.  My suspicion is that her pulmonary hypertension is primarily group 3 due to COPD and idiopathic pulmonary fibrosis.  She has OSA as well.  - I will arrange for repeat RHC next week; we discussed risks/benefits and she agrees to procedure.  If PCWP is  controlled and she has predominantly PAH, I will arrange to start Tyvaso based on INCREASE trial.     - Use home oxygen.  - Continue CPAP.  3. Pulmonary fibrosis: IPF.  She is now on Ofev.  - Dr.  Vaughan Browner is following now.  4. CAD: h/o BMS to RCA in 2004.  LHC in 3/19 showed 70% in-stent restenosis in the RCA.  With no chest pain, plan to treat medically.  - Continue ASA 81.  - She has extensive vascular disease.  Based on COMPASS study, I have her on rivaroxaban 2.5 mg bid.  - She has not been able to tolerate statins due to myalgias.  She has an appointment soon in lipid clinic to get started with Repatha.     5. Carotid stenosis: Followed by [facility], needs appt. 6. PAD: Peripheral arterial dopplers (3/19) showed occluded left SFA.  She has bilateral leg pain, but pain is not typical for claudication.   - Sees Dr. Oneida Alar, he has so far recommended medical management. She is due for an appointment and will call.  - She has quit smoking.  7. COPD: She has now quit smoking.  8. Carotid stenosis: Followed at [facility], due for appt with carotid dopplers.  9. Gout: Gout pain in feet, uric acid was 12 in 11/20.   - I will give her a prednisone burst to get pain under control.  - Start colchicine 0.6 mg daily.  - When pain is controlled, would recommend starting allopurinol.  I will leave this to her PCP.   Followup in 3 wks post-cath.   Loralie Champagne 12/20/2019

## 2019-12-21 ENCOUNTER — Other Ambulatory Visit (HOSPITAL_COMMUNITY): Payer: Self-pay

## 2019-12-21 DIAGNOSIS — I5032 Chronic diastolic (congestive) heart failure: Secondary | ICD-10-CM

## 2019-12-21 MED ORDER — SODIUM CHLORIDE 0.9% FLUSH: 3.0000 mL | Freq: Two times a day (BID) | INTRAVENOUS | Status: DC

## 2019-12-25 ENCOUNTER — Other Ambulatory Visit: Payer: Self-pay | Admitting: Pulmonary Disease

## 2019-12-28 ENCOUNTER — Other Ambulatory Visit (HOSPITAL_COMMUNITY): Payer: PPO

## 2019-12-30 ENCOUNTER — Telehealth (HOSPITAL_COMMUNITY): Payer: Self-pay

## 2019-12-30 ENCOUNTER — Encounter (HOSPITAL_COMMUNITY): Admission: RE | Payer: Self-pay | Source: Home / Self Care

## 2019-12-30 ENCOUNTER — Ambulatory Visit (HOSPITAL_COMMUNITY): Admission: RE | Admit: 2019-12-30 | Payer: PPO | Source: Home / Self Care | Admitting: Cardiology

## 2019-12-30 SURGERY — RIGHT HEART CATH
Anesthesia: LOCAL

## 2019-12-30 NOTE — Telephone Encounter (Signed)
Called patient to reschedule cath. Pt reports flare up of gout and does not know when she will be able to reschedule.  She continues to experience symptoms of gout. She will speak with her daughter and decide when it would be a good time to reschedule number provided to office and she will call me back.

## 2019-12-30 NOTE — Telephone Encounter (Signed)
-----  Message from Claudia Pollock sent at 12/29/2019 11:21 AM EDT ----- Casey cath for 12/30/19

## 2019-12-31 NOTE — Telephone Encounter (Signed)
Returned call to patient, pt reports still not able to schedule cath due to gout. She says she will see how she feels next week. I will call patient on Monday to try to schedule procedure. Pt appreciated the call.

## 2020-01-02 DIAGNOSIS — G4730 Sleep apnea, unspecified: Secondary | ICD-10-CM | POA: Diagnosis not present

## 2020-01-02 DIAGNOSIS — J9610 Chronic respiratory failure, unspecified whether with hypoxia or hypercapnia: Secondary | ICD-10-CM | POA: Diagnosis not present

## 2020-01-02 DIAGNOSIS — Z9989 Dependence on other enabling machines and devices: Secondary | ICD-10-CM | POA: Diagnosis not present

## 2020-01-02 DIAGNOSIS — Z9981 Dependence on supplemental oxygen: Secondary | ICD-10-CM | POA: Diagnosis not present

## 2020-01-02 DIAGNOSIS — M109 Gout, unspecified: Secondary | ICD-10-CM | POA: Diagnosis not present

## 2020-01-02 DIAGNOSIS — Z7982 Long term (current) use of aspirin: Secondary | ICD-10-CM | POA: Diagnosis not present

## 2020-01-02 DIAGNOSIS — R2689 Other abnormalities of gait and mobility: Secondary | ICD-10-CM | POA: Diagnosis not present

## 2020-01-02 DIAGNOSIS — I110 Hypertensive heart disease with heart failure: Secondary | ICD-10-CM | POA: Diagnosis not present

## 2020-01-02 DIAGNOSIS — J8410 Pulmonary fibrosis, unspecified: Secondary | ICD-10-CM | POA: Diagnosis not present

## 2020-01-02 DIAGNOSIS — R41841 Cognitive communication deficit: Secondary | ICD-10-CM | POA: Diagnosis not present

## 2020-01-02 DIAGNOSIS — M19072 Primary osteoarthritis, left ankle and foot: Secondary | ICD-10-CM | POA: Diagnosis not present

## 2020-01-02 DIAGNOSIS — Z20822 Contact with and (suspected) exposure to covid-19: Secondary | ICD-10-CM | POA: Diagnosis not present

## 2020-01-02 DIAGNOSIS — M19041 Primary osteoarthritis, right hand: Secondary | ICD-10-CM | POA: Diagnosis not present

## 2020-01-02 DIAGNOSIS — M7989 Other specified soft tissue disorders: Secondary | ICD-10-CM | POA: Diagnosis not present

## 2020-01-02 DIAGNOSIS — I2510 Atherosclerotic heart disease of native coronary artery without angina pectoris: Secondary | ICD-10-CM | POA: Diagnosis not present

## 2020-01-02 DIAGNOSIS — L0390 Cellulitis, unspecified: Secondary | ICD-10-CM | POA: Diagnosis not present

## 2020-01-02 DIAGNOSIS — M6281 Muscle weakness (generalized): Secondary | ICD-10-CM | POA: Diagnosis not present

## 2020-01-02 DIAGNOSIS — I252 Old myocardial infarction: Secondary | ICD-10-CM | POA: Diagnosis not present

## 2020-01-02 DIAGNOSIS — E876 Hypokalemia: Secondary | ICD-10-CM | POA: Diagnosis not present

## 2020-01-02 DIAGNOSIS — F339 Major depressive disorder, recurrent, unspecified: Secondary | ICD-10-CM | POA: Diagnosis not present

## 2020-01-02 DIAGNOSIS — I10 Essential (primary) hypertension: Secondary | ICD-10-CM | POA: Diagnosis not present

## 2020-01-02 DIAGNOSIS — M19071 Primary osteoarthritis, right ankle and foot: Secondary | ICD-10-CM | POA: Diagnosis not present

## 2020-01-02 DIAGNOSIS — Z87891 Personal history of nicotine dependence: Secondary | ICD-10-CM | POA: Diagnosis not present

## 2020-01-02 DIAGNOSIS — R7989 Other specified abnormal findings of blood chemistry: Secondary | ICD-10-CM | POA: Diagnosis not present

## 2020-01-02 DIAGNOSIS — I509 Heart failure, unspecified: Secondary | ICD-10-CM | POA: Diagnosis not present

## 2020-01-02 DIAGNOSIS — R52 Pain, unspecified: Secondary | ICD-10-CM | POA: Diagnosis not present

## 2020-01-02 DIAGNOSIS — E7800 Pure hypercholesterolemia, unspecified: Secondary | ICD-10-CM | POA: Diagnosis not present

## 2020-01-02 DIAGNOSIS — R609 Edema, unspecified: Secondary | ICD-10-CM | POA: Diagnosis not present

## 2020-01-02 DIAGNOSIS — J449 Chronic obstructive pulmonary disease, unspecified: Secondary | ICD-10-CM | POA: Diagnosis not present

## 2020-01-02 DIAGNOSIS — I959 Hypotension, unspecified: Secondary | ICD-10-CM | POA: Diagnosis not present

## 2020-01-02 DIAGNOSIS — Z7901 Long term (current) use of anticoagulants: Secondary | ICD-10-CM | POA: Diagnosis not present

## 2020-01-02 DIAGNOSIS — R5381 Other malaise: Secondary | ICD-10-CM | POA: Diagnosis not present

## 2020-01-02 DIAGNOSIS — J9611 Chronic respiratory failure with hypoxia: Secondary | ICD-10-CM | POA: Diagnosis not present

## 2020-01-03 ENCOUNTER — Ambulatory Visit: Payer: PPO | Admitting: Cardiology

## 2020-01-07 DIAGNOSIS — I509 Heart failure, unspecified: Secondary | ICD-10-CM | POA: Diagnosis not present

## 2020-01-07 DIAGNOSIS — J449 Chronic obstructive pulmonary disease, unspecified: Secondary | ICD-10-CM | POA: Diagnosis not present

## 2020-01-07 DIAGNOSIS — Z9981 Dependence on supplemental oxygen: Secondary | ICD-10-CM | POA: Diagnosis not present

## 2020-01-07 DIAGNOSIS — I110 Hypertensive heart disease with heart failure: Secondary | ICD-10-CM | POA: Diagnosis not present

## 2020-01-07 DIAGNOSIS — J9611 Chronic respiratory failure with hypoxia: Secondary | ICD-10-CM | POA: Diagnosis not present

## 2020-01-07 DIAGNOSIS — R062 Wheezing: Secondary | ICD-10-CM | POA: Diagnosis not present

## 2020-01-07 DIAGNOSIS — J8410 Pulmonary fibrosis, unspecified: Secondary | ICD-10-CM | POA: Diagnosis not present

## 2020-01-07 DIAGNOSIS — M1009 Idiopathic gout, multiple sites: Secondary | ICD-10-CM | POA: Diagnosis not present

## 2020-01-07 DIAGNOSIS — M6281 Muscle weakness (generalized): Secondary | ICD-10-CM | POA: Diagnosis not present

## 2020-01-07 DIAGNOSIS — I2510 Atherosclerotic heart disease of native coronary artery without angina pectoris: Secondary | ICD-10-CM | POA: Diagnosis not present

## 2020-01-07 DIAGNOSIS — I5032 Chronic diastolic (congestive) heart failure: Secondary | ICD-10-CM | POA: Diagnosis not present

## 2020-01-07 DIAGNOSIS — Z9989 Dependence on other enabling machines and devices: Secondary | ICD-10-CM | POA: Diagnosis not present

## 2020-01-07 DIAGNOSIS — F339 Major depressive disorder, recurrent, unspecified: Secondary | ICD-10-CM | POA: Diagnosis not present

## 2020-01-07 DIAGNOSIS — M109 Gout, unspecified: Secondary | ICD-10-CM | POA: Diagnosis not present

## 2020-01-07 DIAGNOSIS — G4730 Sleep apnea, unspecified: Secondary | ICD-10-CM | POA: Diagnosis not present

## 2020-01-07 DIAGNOSIS — R41841 Cognitive communication deficit: Secondary | ICD-10-CM | POA: Diagnosis not present

## 2020-01-07 DIAGNOSIS — R2689 Other abnormalities of gait and mobility: Secondary | ICD-10-CM | POA: Diagnosis not present

## 2020-01-07 DIAGNOSIS — I5022 Chronic systolic (congestive) heart failure: Secondary | ICD-10-CM | POA: Diagnosis not present

## 2020-01-08 DIAGNOSIS — I2510 Atherosclerotic heart disease of native coronary artery without angina pectoris: Secondary | ICD-10-CM | POA: Diagnosis not present

## 2020-01-08 DIAGNOSIS — I5022 Chronic systolic (congestive) heart failure: Secondary | ICD-10-CM | POA: Diagnosis not present

## 2020-01-08 DIAGNOSIS — M1009 Idiopathic gout, multiple sites: Secondary | ICD-10-CM | POA: Diagnosis not present

## 2020-01-10 ENCOUNTER — Encounter: Payer: Self-pay | Admitting: General Practice

## 2020-01-10 ENCOUNTER — Encounter (HOSPITAL_COMMUNITY): Payer: PPO | Admitting: Cardiology

## 2020-01-10 ENCOUNTER — Ambulatory Visit: Payer: PPO

## 2020-01-10 NOTE — Telephone Encounter (Signed)
Called patient as I have been unable to speak with her to schedule cath. LM for patient to return call

## 2020-01-30 DIAGNOSIS — I509 Heart failure, unspecified: Secondary | ICD-10-CM | POA: Diagnosis not present

## 2020-01-30 DIAGNOSIS — J849 Interstitial pulmonary disease, unspecified: Secondary | ICD-10-CM | POA: Diagnosis not present

## 2020-01-30 DIAGNOSIS — E785 Hyperlipidemia, unspecified: Secondary | ICD-10-CM | POA: Diagnosis not present

## 2020-01-30 DIAGNOSIS — I2510 Atherosclerotic heart disease of native coronary artery without angina pectoris: Secondary | ICD-10-CM | POA: Diagnosis not present

## 2020-01-30 DIAGNOSIS — M5136 Other intervertebral disc degeneration, lumbar region: Secondary | ICD-10-CM | POA: Diagnosis not present

## 2020-01-30 DIAGNOSIS — G4700 Insomnia, unspecified: Secondary | ICD-10-CM | POA: Diagnosis not present

## 2020-01-30 DIAGNOSIS — J449 Chronic obstructive pulmonary disease, unspecified: Secondary | ICD-10-CM | POA: Diagnosis not present

## 2020-01-30 DIAGNOSIS — I252 Old myocardial infarction: Secondary | ICD-10-CM | POA: Diagnosis not present

## 2020-01-30 DIAGNOSIS — J301 Allergic rhinitis due to pollen: Secondary | ICD-10-CM | POA: Diagnosis not present

## 2020-01-30 DIAGNOSIS — I110 Hypertensive heart disease with heart failure: Secondary | ICD-10-CM | POA: Diagnosis not present

## 2020-01-30 DIAGNOSIS — E7800 Pure hypercholesterolemia, unspecified: Secondary | ICD-10-CM | POA: Diagnosis not present

## 2020-01-30 DIAGNOSIS — M109 Gout, unspecified: Secondary | ICD-10-CM | POA: Diagnosis not present

## 2020-01-30 DIAGNOSIS — F339 Major depressive disorder, recurrent, unspecified: Secondary | ICD-10-CM | POA: Diagnosis not present

## 2020-02-07 DIAGNOSIS — Z7189 Other specified counseling: Secondary | ICD-10-CM | POA: Diagnosis not present

## 2020-02-07 DIAGNOSIS — Z09 Encounter for follow-up examination after completed treatment for conditions other than malignant neoplasm: Secondary | ICD-10-CM | POA: Diagnosis not present

## 2020-02-07 DIAGNOSIS — F329 Major depressive disorder, single episode, unspecified: Secondary | ICD-10-CM | POA: Diagnosis not present

## 2020-02-07 DIAGNOSIS — I5033 Acute on chronic diastolic (congestive) heart failure: Secondary | ICD-10-CM | POA: Diagnosis not present

## 2020-02-07 DIAGNOSIS — J84112 Idiopathic pulmonary fibrosis: Secondary | ICD-10-CM | POA: Diagnosis not present

## 2020-02-07 DIAGNOSIS — L304 Erythema intertrigo: Secondary | ICD-10-CM | POA: Diagnosis not present

## 2020-02-07 DIAGNOSIS — M109 Gout, unspecified: Secondary | ICD-10-CM | POA: Diagnosis not present

## 2020-02-07 DIAGNOSIS — R630 Anorexia: Secondary | ICD-10-CM | POA: Diagnosis not present

## 2020-02-07 DIAGNOSIS — R7301 Impaired fasting glucose: Secondary | ICD-10-CM | POA: Diagnosis not present

## 2020-02-17 ENCOUNTER — Other Ambulatory Visit (HOSPITAL_COMMUNITY): Payer: Self-pay | Admitting: Cardiology

## 2020-02-17 DIAGNOSIS — R062 Wheezing: Secondary | ICD-10-CM | POA: Diagnosis not present

## 2020-02-17 DIAGNOSIS — R2689 Other abnormalities of gait and mobility: Secondary | ICD-10-CM | POA: Diagnosis not present

## 2020-02-17 DIAGNOSIS — I5032 Chronic diastolic (congestive) heart failure: Secondary | ICD-10-CM | POA: Diagnosis not present

## 2020-02-21 DIAGNOSIS — I5033 Acute on chronic diastolic (congestive) heart failure: Secondary | ICD-10-CM | POA: Diagnosis not present

## 2020-02-24 DIAGNOSIS — J301 Allergic rhinitis due to pollen: Secondary | ICD-10-CM | POA: Diagnosis not present

## 2020-02-24 DIAGNOSIS — G4700 Insomnia, unspecified: Secondary | ICD-10-CM | POA: Diagnosis not present

## 2020-02-24 DIAGNOSIS — I2510 Atherosclerotic heart disease of native coronary artery without angina pectoris: Secondary | ICD-10-CM | POA: Diagnosis not present

## 2020-02-24 DIAGNOSIS — I509 Heart failure, unspecified: Secondary | ICD-10-CM | POA: Diagnosis not present

## 2020-02-24 DIAGNOSIS — M109 Gout, unspecified: Secondary | ICD-10-CM | POA: Diagnosis not present

## 2020-02-24 DIAGNOSIS — I110 Hypertensive heart disease with heart failure: Secondary | ICD-10-CM | POA: Diagnosis not present

## 2020-02-24 DIAGNOSIS — F339 Major depressive disorder, recurrent, unspecified: Secondary | ICD-10-CM | POA: Diagnosis not present

## 2020-02-24 DIAGNOSIS — J849 Interstitial pulmonary disease, unspecified: Secondary | ICD-10-CM | POA: Diagnosis not present

## 2020-02-24 DIAGNOSIS — J449 Chronic obstructive pulmonary disease, unspecified: Secondary | ICD-10-CM | POA: Diagnosis not present

## 2020-02-24 DIAGNOSIS — M5136 Other intervertebral disc degeneration, lumbar region: Secondary | ICD-10-CM | POA: Diagnosis not present

## 2020-02-24 DIAGNOSIS — E785 Hyperlipidemia, unspecified: Secondary | ICD-10-CM | POA: Diagnosis not present

## 2020-02-24 DIAGNOSIS — E7800 Pure hypercholesterolemia, unspecified: Secondary | ICD-10-CM | POA: Diagnosis not present

## 2020-02-24 DIAGNOSIS — I252 Old myocardial infarction: Secondary | ICD-10-CM | POA: Diagnosis not present

## 2020-02-27 DIAGNOSIS — R0902 Hypoxemia: Secondary | ICD-10-CM | POA: Diagnosis not present

## 2020-02-27 DIAGNOSIS — G319 Degenerative disease of nervous system, unspecified: Secondary | ICD-10-CM | POA: Diagnosis not present

## 2020-02-27 DIAGNOSIS — R569 Unspecified convulsions: Secondary | ICD-10-CM | POA: Diagnosis not present

## 2020-02-27 DIAGNOSIS — E785 Hyperlipidemia, unspecified: Secondary | ICD-10-CM | POA: Diagnosis not present

## 2020-02-27 DIAGNOSIS — M47816 Spondylosis without myelopathy or radiculopathy, lumbar region: Secondary | ICD-10-CM | POA: Diagnosis not present

## 2020-02-27 DIAGNOSIS — K449 Diaphragmatic hernia without obstruction or gangrene: Secondary | ICD-10-CM | POA: Diagnosis not present

## 2020-02-27 DIAGNOSIS — K5090 Crohn's disease, unspecified, without complications: Secondary | ICD-10-CM | POA: Diagnosis not present

## 2020-02-27 DIAGNOSIS — N281 Cyst of kidney, acquired: Secondary | ICD-10-CM | POA: Diagnosis not present

## 2020-02-27 DIAGNOSIS — I6782 Cerebral ischemia: Secondary | ICD-10-CM | POA: Diagnosis not present

## 2020-02-27 DIAGNOSIS — R52 Pain, unspecified: Secondary | ICD-10-CM | POA: Diagnosis not present

## 2020-02-27 DIAGNOSIS — Z8673 Personal history of transient ischemic attack (TIA), and cerebral infarction without residual deficits: Secondary | ICD-10-CM | POA: Diagnosis not present

## 2020-02-27 DIAGNOSIS — Z66 Do not resuscitate: Secondary | ICD-10-CM | POA: Diagnosis not present

## 2020-02-27 DIAGNOSIS — J449 Chronic obstructive pulmonary disease, unspecified: Secondary | ICD-10-CM | POA: Diagnosis not present

## 2020-02-27 DIAGNOSIS — I214 Non-ST elevation (NSTEMI) myocardial infarction: Secondary | ICD-10-CM | POA: Diagnosis not present

## 2020-02-27 DIAGNOSIS — Z87891 Personal history of nicotine dependence: Secondary | ICD-10-CM | POA: Diagnosis not present

## 2020-02-27 DIAGNOSIS — I252 Old myocardial infarction: Secondary | ICD-10-CM | POA: Diagnosis not present

## 2020-02-27 DIAGNOSIS — I4891 Unspecified atrial fibrillation: Secondary | ICD-10-CM | POA: Diagnosis not present

## 2020-02-27 DIAGNOSIS — I4510 Unspecified right bundle-branch block: Secondary | ICD-10-CM | POA: Diagnosis not present

## 2020-02-27 DIAGNOSIS — R404 Transient alteration of awareness: Secondary | ICD-10-CM | POA: Diagnosis not present

## 2020-02-27 DIAGNOSIS — Z20822 Contact with and (suspected) exposure to covid-19: Secondary | ICD-10-CM | POA: Diagnosis not present

## 2020-02-27 DIAGNOSIS — I700 Atherosclerosis of aorta: Secondary | ICD-10-CM | POA: Diagnosis not present

## 2020-02-27 DIAGNOSIS — R55 Syncope and collapse: Secondary | ICD-10-CM | POA: Diagnosis not present

## 2020-02-27 DIAGNOSIS — Z85820 Personal history of malignant melanoma of skin: Secondary | ICD-10-CM | POA: Diagnosis not present

## 2020-02-27 DIAGNOSIS — I110 Hypertensive heart disease with heart failure: Secondary | ICD-10-CM | POA: Diagnosis not present

## 2020-02-27 DIAGNOSIS — I959 Hypotension, unspecified: Secondary | ICD-10-CM | POA: Diagnosis not present

## 2020-02-27 DIAGNOSIS — Z950 Presence of cardiac pacemaker: Secondary | ICD-10-CM | POA: Diagnosis not present

## 2020-02-27 DIAGNOSIS — I509 Heart failure, unspecified: Secondary | ICD-10-CM | POA: Diagnosis not present

## 2020-02-27 DIAGNOSIS — Z955 Presence of coronary angioplasty implant and graft: Secondary | ICD-10-CM | POA: Diagnosis not present

## 2020-02-27 DIAGNOSIS — I2510 Atherosclerotic heart disease of native coronary artery without angina pectoris: Secondary | ICD-10-CM | POA: Diagnosis not present

## 2020-02-27 DIAGNOSIS — G459 Transient cerebral ischemic attack, unspecified: Secondary | ICD-10-CM | POA: Diagnosis not present

## 2020-02-28 ENCOUNTER — Inpatient Hospital Stay (HOSPITAL_COMMUNITY)
Admission: AD | Admit: 2020-02-28 | Discharge: 2020-03-08 | DRG: 286 | Disposition: A | Discharge status: Skilled Nursing Facility | Payer: PPO | Source: Other Acute Inpatient Hospital | Attending: Internal Medicine | Admitting: Internal Medicine

## 2020-02-28 ENCOUNTER — Inpatient Hospital Stay (HOSPITAL_COMMUNITY): Payer: PPO

## 2020-02-28 ENCOUNTER — Other Ambulatory Visit: Payer: Self-pay | Admitting: Physician Assistant

## 2020-02-28 DIAGNOSIS — J8410 Pulmonary fibrosis, unspecified: Secondary | ICD-10-CM | POA: Diagnosis not present

## 2020-02-28 DIAGNOSIS — D638 Anemia in other chronic diseases classified elsewhere: Secondary | ICD-10-CM | POA: Diagnosis not present

## 2020-02-28 DIAGNOSIS — J9611 Chronic respiratory failure with hypoxia: Secondary | ICD-10-CM

## 2020-02-28 DIAGNOSIS — R55 Syncope and collapse: Secondary | ICD-10-CM

## 2020-02-28 DIAGNOSIS — I272 Pulmonary hypertension, unspecified: Secondary | ICD-10-CM

## 2020-02-28 DIAGNOSIS — L89621 Pressure ulcer of left heel, stage 1: Secondary | ICD-10-CM | POA: Diagnosis present

## 2020-02-28 DIAGNOSIS — Z880 Allergy status to penicillin: Secondary | ICD-10-CM

## 2020-02-28 DIAGNOSIS — Z86718 Personal history of other venous thrombosis and embolism: Secondary | ICD-10-CM

## 2020-02-28 DIAGNOSIS — B9620 Unspecified Escherichia coli [E. coli] as the cause of diseases classified elsewhere: Secondary | ICD-10-CM | POA: Diagnosis present

## 2020-02-28 DIAGNOSIS — I371 Nonrheumatic pulmonary valve insufficiency: Secondary | ICD-10-CM

## 2020-02-28 DIAGNOSIS — I2699 Other pulmonary embolism without acute cor pulmonale: Secondary | ICD-10-CM | POA: Insufficient documentation

## 2020-02-28 DIAGNOSIS — Z8249 Family history of ischemic heart disease and other diseases of the circulatory system: Secondary | ICD-10-CM

## 2020-02-28 DIAGNOSIS — Z66 Do not resuscitate: Secondary | ICD-10-CM | POA: Diagnosis not present

## 2020-02-28 DIAGNOSIS — Z8619 Personal history of other infectious and parasitic diseases: Secondary | ICD-10-CM

## 2020-02-28 DIAGNOSIS — I2782 Chronic pulmonary embolism: Secondary | ICD-10-CM

## 2020-02-28 DIAGNOSIS — M2550 Pain in unspecified joint: Secondary | ICD-10-CM | POA: Diagnosis not present

## 2020-02-28 DIAGNOSIS — K5010 Crohn's disease of large intestine without complications: Secondary | ICD-10-CM | POA: Diagnosis present

## 2020-02-28 DIAGNOSIS — N390 Urinary tract infection, site not specified: Secondary | ICD-10-CM | POA: Diagnosis not present

## 2020-02-28 DIAGNOSIS — I2721 Secondary pulmonary arterial hypertension: Secondary | ICD-10-CM | POA: Diagnosis present

## 2020-02-28 DIAGNOSIS — I082 Rheumatic disorders of both aortic and tricuspid valves: Secondary | ICD-10-CM | POA: Diagnosis present

## 2020-02-28 DIAGNOSIS — R41841 Cognitive communication deficit: Secondary | ICD-10-CM | POA: Diagnosis not present

## 2020-02-28 DIAGNOSIS — I447 Left bundle-branch block, unspecified: Secondary | ICD-10-CM | POA: Diagnosis present

## 2020-02-28 DIAGNOSIS — M109 Gout, unspecified: Secondary | ICD-10-CM | POA: Diagnosis not present

## 2020-02-28 DIAGNOSIS — R531 Weakness: Secondary | ICD-10-CM | POA: Diagnosis not present

## 2020-02-28 DIAGNOSIS — I2510 Atherosclerotic heart disease of native coronary artery without angina pectoris: Secondary | ICD-10-CM | POA: Diagnosis not present

## 2020-02-28 DIAGNOSIS — Z888 Allergy status to other drugs, medicaments and biological substances status: Secondary | ICD-10-CM

## 2020-02-28 DIAGNOSIS — Z20822 Contact with and (suspected) exposure to covid-19: Secondary | ICD-10-CM | POA: Diagnosis present

## 2020-02-28 DIAGNOSIS — F329 Major depressive disorder, single episode, unspecified: Secondary | ICD-10-CM | POA: Diagnosis present

## 2020-02-28 DIAGNOSIS — E860 Dehydration: Secondary | ICD-10-CM | POA: Diagnosis not present

## 2020-02-28 DIAGNOSIS — Z8673 Personal history of transient ischemic attack (TIA), and cerebral infarction without residual deficits: Secondary | ICD-10-CM

## 2020-02-28 DIAGNOSIS — I110 Hypertensive heart disease with heart failure: Secondary | ICD-10-CM | POA: Diagnosis present

## 2020-02-28 DIAGNOSIS — G4730 Sleep apnea, unspecified: Secondary | ICD-10-CM | POA: Diagnosis not present

## 2020-02-28 DIAGNOSIS — I6529 Occlusion and stenosis of unspecified carotid artery: Secondary | ICD-10-CM | POA: Diagnosis present

## 2020-02-28 DIAGNOSIS — L89313 Pressure ulcer of right buttock, stage 3: Secondary | ICD-10-CM | POA: Diagnosis present

## 2020-02-28 DIAGNOSIS — Z9981 Dependence on supplemental oxygen: Secondary | ICD-10-CM | POA: Diagnosis not present

## 2020-02-28 DIAGNOSIS — I50810 Right heart failure, unspecified: Secondary | ICD-10-CM | POA: Diagnosis not present

## 2020-02-28 DIAGNOSIS — Z882 Allergy status to sulfonamides status: Secondary | ICD-10-CM

## 2020-02-28 DIAGNOSIS — E782 Mixed hyperlipidemia: Secondary | ICD-10-CM | POA: Diagnosis present

## 2020-02-28 DIAGNOSIS — R262 Difficulty in walking, not elsewhere classified: Secondary | ICD-10-CM | POA: Diagnosis present

## 2020-02-28 DIAGNOSIS — Z881 Allergy status to other antibiotic agents status: Secondary | ICD-10-CM

## 2020-02-28 DIAGNOSIS — M6281 Muscle weakness (generalized): Secondary | ICD-10-CM | POA: Diagnosis not present

## 2020-02-28 DIAGNOSIS — Z79899 Other long term (current) drug therapy: Secondary | ICD-10-CM

## 2020-02-28 DIAGNOSIS — M1990 Unspecified osteoarthritis, unspecified site: Secondary | ICD-10-CM | POA: Diagnosis present

## 2020-02-28 DIAGNOSIS — I5032 Chronic diastolic (congestive) heart failure: Secondary | ICD-10-CM | POA: Diagnosis present

## 2020-02-28 DIAGNOSIS — Z87891 Personal history of nicotine dependence: Secondary | ICD-10-CM

## 2020-02-28 DIAGNOSIS — K5900 Constipation, unspecified: Secondary | ICD-10-CM | POA: Diagnosis present

## 2020-02-28 DIAGNOSIS — Z7401 Bed confinement status: Secondary | ICD-10-CM | POA: Diagnosis not present

## 2020-02-28 DIAGNOSIS — Z83438 Family history of other disorder of lipoprotein metabolism and other lipidemia: Secondary | ICD-10-CM

## 2020-02-28 DIAGNOSIS — E876 Hypokalemia: Secondary | ICD-10-CM | POA: Diagnosis present

## 2020-02-28 DIAGNOSIS — I248 Other forms of acute ischemic heart disease: Secondary | ICD-10-CM | POA: Diagnosis not present

## 2020-02-28 DIAGNOSIS — I471 Supraventricular tachycardia: Secondary | ICD-10-CM | POA: Diagnosis not present

## 2020-02-28 DIAGNOSIS — R569 Unspecified convulsions: Secondary | ICD-10-CM | POA: Diagnosis present

## 2020-02-28 DIAGNOSIS — J84112 Idiopathic pulmonary fibrosis: Secondary | ICD-10-CM | POA: Diagnosis present

## 2020-02-28 DIAGNOSIS — D539 Nutritional anemia, unspecified: Secondary | ICD-10-CM | POA: Diagnosis present

## 2020-02-28 DIAGNOSIS — L8990 Pressure ulcer of unspecified site, unspecified stage: Secondary | ICD-10-CM | POA: Insufficient documentation

## 2020-02-28 DIAGNOSIS — J449 Chronic obstructive pulmonary disease, unspecified: Secondary | ICD-10-CM | POA: Diagnosis not present

## 2020-02-28 DIAGNOSIS — L89611 Pressure ulcer of right heel, stage 1: Secondary | ICD-10-CM | POA: Diagnosis present

## 2020-02-28 DIAGNOSIS — Z6825 Body mass index (BMI) 25.0-25.9, adult: Secondary | ICD-10-CM

## 2020-02-28 DIAGNOSIS — I7092 Chronic total occlusion of artery of the extremities: Secondary | ICD-10-CM | POA: Diagnosis present

## 2020-02-28 DIAGNOSIS — F419 Anxiety disorder, unspecified: Secondary | ICD-10-CM | POA: Diagnosis present

## 2020-02-28 DIAGNOSIS — E042 Nontoxic multinodular goiter: Secondary | ICD-10-CM | POA: Diagnosis present

## 2020-02-28 DIAGNOSIS — R627 Adult failure to thrive: Secondary | ICD-10-CM | POA: Diagnosis present

## 2020-02-28 DIAGNOSIS — J439 Emphysema, unspecified: Secondary | ICD-10-CM | POA: Diagnosis present

## 2020-02-28 DIAGNOSIS — G459 Transient cerebral ischemic attack, unspecified: Secondary | ICD-10-CM | POA: Diagnosis not present

## 2020-02-28 DIAGNOSIS — R2689 Other abnormalities of gait and mobility: Secondary | ICD-10-CM | POA: Diagnosis not present

## 2020-02-28 DIAGNOSIS — I252 Old myocardial infarction: Secondary | ICD-10-CM

## 2020-02-28 DIAGNOSIS — I50813 Acute on chronic right heart failure: Principal | ICD-10-CM | POA: Diagnosis present

## 2020-02-28 DIAGNOSIS — L89323 Pressure ulcer of left buttock, stage 3: Secondary | ICD-10-CM | POA: Diagnosis not present

## 2020-02-28 DIAGNOSIS — Z7982 Long term (current) use of aspirin: Secondary | ICD-10-CM

## 2020-02-28 DIAGNOSIS — Z96651 Presence of right artificial knee joint: Secondary | ICD-10-CM | POA: Diagnosis present

## 2020-02-28 DIAGNOSIS — M5136 Other intervertebral disc degeneration, lumbar region: Secondary | ICD-10-CM | POA: Diagnosis present

## 2020-02-28 DIAGNOSIS — I959 Hypotension, unspecified: Secondary | ICD-10-CM | POA: Diagnosis not present

## 2020-02-28 DIAGNOSIS — I25119 Atherosclerotic heart disease of native coronary artery with unspecified angina pectoris: Secondary | ICD-10-CM

## 2020-02-28 DIAGNOSIS — N281 Cyst of kidney, acquired: Secondary | ICD-10-CM | POA: Diagnosis present

## 2020-02-28 DIAGNOSIS — E440 Moderate protein-calorie malnutrition: Secondary | ICD-10-CM | POA: Diagnosis not present

## 2020-02-28 DIAGNOSIS — Z86010 Personal history of colonic polyps: Secondary | ICD-10-CM

## 2020-02-28 DIAGNOSIS — F339 Major depressive disorder, recurrent, unspecified: Secondary | ICD-10-CM | POA: Diagnosis not present

## 2020-02-28 DIAGNOSIS — Z85820 Personal history of malignant melanoma of skin: Secondary | ICD-10-CM

## 2020-02-28 DIAGNOSIS — I70202 Unspecified atherosclerosis of native arteries of extremities, left leg: Secondary | ICD-10-CM | POA: Diagnosis not present

## 2020-02-28 DIAGNOSIS — M810 Age-related osteoporosis without current pathological fracture: Secondary | ICD-10-CM | POA: Diagnosis present

## 2020-02-28 DIAGNOSIS — K449 Diaphragmatic hernia without obstruction or gangrene: Secondary | ICD-10-CM | POA: Diagnosis present

## 2020-02-28 DIAGNOSIS — Z7901 Long term (current) use of anticoagulants: Secondary | ICD-10-CM

## 2020-02-28 DIAGNOSIS — G4733 Obstructive sleep apnea (adult) (pediatric): Secondary | ICD-10-CM | POA: Diagnosis present

## 2020-02-28 DIAGNOSIS — I700 Atherosclerosis of aorta: Secondary | ICD-10-CM | POA: Diagnosis present

## 2020-02-28 DIAGNOSIS — K760 Fatty (change of) liver, not elsewhere classified: Secondary | ICD-10-CM | POA: Diagnosis present

## 2020-02-28 LAB — COMPREHENSIVE METABOLIC PANEL
ALT: 27 U/L (ref 0–44)
AST: 53 U/L — ABNORMAL HIGH (ref 15–41)
Albumin: 2.1 g/dL — ABNORMAL LOW (ref 3.5–5.0)
Alkaline Phosphatase: 64 U/L (ref 38–126)
Anion gap: 13 (ref 5–15)
BUN: 19 mg/dL (ref 8–23)
CO2: 19 mmol/L — ABNORMAL LOW (ref 22–32)
Calcium: 8.1 mg/dL — ABNORMAL LOW (ref 8.9–10.3)
Chloride: 105 mmol/L (ref 98–111)
Creatinine, Ser: 1.15 mg/dL — ABNORMAL HIGH (ref 0.44–1.00)
GFR calc Af Amer: 52 mL/min — ABNORMAL LOW (ref >60)
GFR calc non Af Amer: 45 mL/min — ABNORMAL LOW (ref >60)
Glucose, Bld: 88 mg/dL (ref 70–99)
Potassium: 3.4 mmol/L — ABNORMAL LOW (ref 3.5–5.1)
Sodium: 137 mmol/L (ref 135–145)
Total Bilirubin: 0.8 mg/dL (ref 0.3–1.2)
Total Protein: 5.3 g/dL — ABNORMAL LOW (ref 6.5–8.1)

## 2020-02-28 LAB — CBC WITH DIFFERENTIAL/PLATELET
Abs Immature Granulocytes: 0.11 10*3/uL — ABNORMAL HIGH (ref 0.00–0.07)
Basophils Absolute: 0 10*3/uL (ref 0.0–0.1)
Basophils Relative: 0 %
Eosinophils Absolute: 0 10*3/uL (ref 0.0–0.5)
Eosinophils Relative: 0 %
HCT: 34.3 % — ABNORMAL LOW (ref 36.0–46.0)
Hemoglobin: 10.9 g/dL — ABNORMAL LOW (ref 12.0–15.0)
Immature Granulocytes: 1 %
Lymphocytes Relative: 14 %
Lymphs Abs: 1.4 10*3/uL (ref 0.7–4.0)
MCH: 33.1 pg (ref 26.0–34.0)
MCHC: 31.8 g/dL (ref 30.0–36.0)
MCV: 104.3 fL — ABNORMAL HIGH (ref 80.0–100.0)
Monocytes Absolute: 0.6 10*3/uL (ref 0.1–1.0)
Monocytes Relative: 6 %
Neutro Abs: 7.6 10*3/uL (ref 1.7–7.7)
Neutrophils Relative %: 79 %
Platelets: 286 10*3/uL (ref 150–400)
RBC: 3.29 MIL/uL — ABNORMAL LOW (ref 3.87–5.11)
RDW: 16.7 % — ABNORMAL HIGH (ref 11.5–15.5)
WBC: 9.7 10*3/uL (ref 4.0–10.5)
nRBC: 0.2 % (ref 0.0–0.2)

## 2020-02-28 LAB — GLUCOSE, CAPILLARY
Glucose-Capillary: 57 mg/dL — ABNORMAL LOW (ref 70–99)
Glucose-Capillary: 76 mg/dL (ref 70–99)

## 2020-02-28 LAB — LACTIC ACID, PLASMA
Lactic Acid, Venous: 1.6 mmol/L (ref 0.5–1.9)
Lactic Acid, Venous: 1.9 mmol/L (ref 0.5–1.9)

## 2020-02-28 LAB — ECHOCARDIOGRAM LIMITED: Weight: 2592.61 oz

## 2020-02-28 LAB — MRSA PCR SCREENING: MRSA by PCR: NEGATIVE

## 2020-02-28 LAB — BRAIN NATRIURETIC PEPTIDE: B Natriuretic Peptide: 2646.6 pg/mL — ABNORMAL HIGH (ref 0.0–100.0)

## 2020-02-28 MED ORDER — FOLIC ACID 1 MG PO TABS
1.0000 mg | ORAL_TABLET | Freq: Every day | ORAL | Status: DC
  Administered 2020-02-28 – 2020-03-08 (×10): 1 mg via ORAL
  Filled 2020-02-28 (×10): qty 1

## 2020-02-28 MED ORDER — FLUOXETINE HCL 20 MG PO CAPS
40.0000 mg | ORAL_CAPSULE | Freq: Every day | ORAL | Status: DC
  Administered 2020-02-28 – 2020-03-08 (×10): 40 mg via ORAL
  Filled 2020-02-28 (×5): qty 2
  Filled 2020-02-28 (×2): qty 4
  Filled 2020-02-28 (×4): qty 2
  Filled 2020-02-28: qty 4
  Filled 2020-02-28: qty 2

## 2020-02-28 MED ORDER — DOCUSATE SODIUM 100 MG PO CAPS
100.0000 mg | ORAL_CAPSULE | Freq: Two times a day (BID) | ORAL | Status: DC | PRN
  Administered 2020-03-04 – 2020-03-05 (×2): 100 mg via ORAL
  Filled 2020-02-28 (×2): qty 1

## 2020-02-28 MED ORDER — POLYETHYLENE GLYCOL 3350 17 G PO PACK: 17.0000 g | PACK | Freq: Every day | ORAL | Status: DC | PRN

## 2020-02-28 MED ORDER — COLCHICINE 0.6 MG PO TABS
0.6000 mg | ORAL_TABLET | Freq: Every day | ORAL | Status: DC
  Administered 2020-02-29 – 2020-03-08 (×9): 0.6 mg via ORAL
  Filled 2020-02-28 (×10): qty 1

## 2020-02-28 MED ORDER — ASPIRIN EC 81 MG PO TBEC
81.0000 mg | DELAYED_RELEASE_TABLET | Freq: Every day | ORAL | Status: DC
  Administered 2020-02-28 – 2020-03-08 (×9): 81 mg via ORAL
  Filled 2020-02-28 (×10): qty 1

## 2020-02-28 MED ORDER — SULFASALAZINE 500 MG PO TABS
500.0000 mg | ORAL_TABLET | Freq: Two times a day (BID) | ORAL | Status: DC
  Administered 2020-02-28 – 2020-03-08 (×18): 500 mg via ORAL
  Filled 2020-02-28 (×19): qty 1

## 2020-02-28 MED ORDER — NINTEDANIB ESYLATE 100 MG PO CAPS
1.0000 | ORAL_CAPSULE | Freq: Two times a day (BID) | ORAL | Status: DC
  Administered 2020-02-29 – 2020-03-08 (×15): 100 mg via ORAL
  Filled 2020-02-28 (×18): qty 1

## 2020-02-28 MED ORDER — FERROUS SULFATE 325 (65 FE) MG PO TABS
325.0000 mg | ORAL_TABLET | Freq: Every day | ORAL | Status: DC
  Administered 2020-02-28 – 2020-03-08 (×10): 325 mg via ORAL
  Filled 2020-02-28 (×10): qty 1

## 2020-02-28 MED ORDER — CHLORHEXIDINE GLUCONATE CLOTH 2 % EX PADS
6.0000 | MEDICATED_PAD | Freq: Every day | CUTANEOUS | Status: DC
  Administered 2020-02-29 – 2020-03-06 (×4): 6 via TOPICAL

## 2020-02-28 MED ORDER — FUROSEMIDE 10 MG/ML IJ SOLN
80.0000 mg | Freq: Two times a day (BID) | INTRAMUSCULAR | Status: DC
  Administered 2020-02-28 – 2020-02-29 (×3): 80 mg via INTRAVENOUS
  Filled 2020-02-28 (×4): qty 8

## 2020-02-28 MED ORDER — SODIUM CHLORIDE 0.9 % IV SOLN
250.0000 mL | INTRAVENOUS | Status: DC
  Administered 2020-03-01: 250 mL via INTRAVENOUS

## 2020-02-28 MED ORDER — NOREPINEPHRINE 4 MG/250ML-% IV SOLN
2.0000 ug/min | INTRAVENOUS | Status: DC
  Administered 2020-02-28 – 2020-02-29 (×2): 5 ug/min via INTRAVENOUS
  Filled 2020-02-28 (×3): qty 250

## 2020-02-28 MED ORDER — MIDODRINE HCL 5 MG PO TABS
5.0000 mg | ORAL_TABLET | Freq: Three times a day (TID) | ORAL | Status: DC
  Administered 2020-02-28 – 2020-02-29 (×2): 5 mg via ORAL
  Filled 2020-02-28 (×2): qty 1

## 2020-02-28 MED ORDER — SODIUM CHLORIDE 0.9 % IV SOLN: INTRAVENOUS | Status: DC

## 2020-02-28 MED ORDER — ENOXAPARIN SODIUM 40 MG/0.4ML ~~LOC~~ SOLN
40.0000 mg | SUBCUTANEOUS | Status: DC
  Filled 2020-02-28: qty 0.4

## 2020-02-28 MED ORDER — POTASSIUM CHLORIDE CRYS ER 20 MEQ PO TBCR
40.0000 meq | EXTENDED_RELEASE_TABLET | Freq: Once | ORAL | Status: AC
  Administered 2020-02-28: 40 meq via ORAL
  Filled 2020-02-28: qty 2

## 2020-02-28 MED ORDER — RIVAROXABAN 2.5 MG PO TABS
2.5000 mg | ORAL_TABLET | Freq: Two times a day (BID) | ORAL | Status: DC
  Administered 2020-02-28 – 2020-03-08 (×18): 2.5 mg via ORAL
  Filled 2020-02-28 (×19): qty 1

## 2020-02-28 MED ORDER — SODIUM CHLORIDE 0.9 % IV SOLN: 250.0000 mL | INTRAVENOUS | Status: DC

## 2020-02-28 NOTE — H&P (Addendum)
NAME:  Shelley [name], MRN:  [id number], DOB:  1940/11/23, LOS: 0 ADMISSION DATE:  02/28/2020, CONSULTATION DATE:  02/28/20 REFERRING MD:  Wallene Huh, CHIEF COMPLAINT:  hypotension   Brief History   82 yof with hypertension, HLD, HFpEF, CAD s/p stent placement, IPF with pulm hypertension, Chrohn's disease, and depression who was transferred from [facility] ED for syncope>hypotension requiring pressor support.   History of present illness   89 yof with hypertension, HLD, HFpEF, CAD s/p BMS to RCA in 2004, IPF with pulm hypertension, Chrohn's disease, and depression who initially presented to [facility] ED on 02/28/20 following a syncopal event at home. Pt and her two daughters are the primary historian Pt recalls having been sitting on the toliet for about 58mn prior to the episode. She denies any prodromal symptoms preceding the event. The next thing she remembered was waking up at the hospital. The daughters recall her sitting on the toilet and suddenly going stiff and having jerking movements for a few seconds prior to becoming limp. The daughter caught her from falling. She also notes that her lips turned blue and extremities cold.  When EMS arrived, they noted a blood pressure of 56/27. Upon arrival to the ED, levophed was started with improvement in pressures.  [facility]  Past Medical History  hypertension, HLD, HFpEF, CAD s/p BMS to RCA in 2004, carotid stenosis, COPD IPF with pulm hypertension, Crohn's disease, and depression   Significant Hospital Events   [facility] Consults:  Cardiology HF  Procedures:  n/a  Significant Diagnostic Tests:  7/4 CXR>> mild, diffuse, chronic appearing increased interstitial lung markings. No evidence of acute infiltrate.  7/4 head CT>>no acute findings 7/4 abd/pelvis CT>>small hiatal hernia, bilateral renal cysts--no acute findings.  Micro Data:  7/5  blood culture>> 7/5 urine culture>>  Antimicrobials:  n/a  Interim history/subjective:  See above  Objective   Blood pressure (!) 91/49, pulse 71, resp. rate 19, weight 73.5 kg, SpO2 98 %.       No intake or output data in the 24 hours ending 02/28/20 1541 Filed Weights   02/28/20 1234  Weight: 73.5 kg    Examination: General: well appearing female HENT: MMM Lungs: breathing comfortably on [place]. Wet sounding cough. Bibasilar fine crackles. Cardiovascular: RRR. Extremities warm. Peripheral pulses intact. No appreciable JVD. No LE edema.  Abdomen: soft, nontender, bs active Extremities: moves all four extremities Neuro: a/o. CN II-XII grossly intact. No tremor. 5/5 strength in all extremities. No focal deficits appreciable. Skin: bilateral pressures ulcers present on heels. Diffuse petechial like ecchymotic lesions over extremities  Resolved Hospital Problem list   n/a  Assessment & Plan:   Syncope/Hypotension requiring pressor support. Ddx:  hypovolemic, dysrrhythmia. Other considerations would be vasovagal,seizure, CVA, PE, ACS. Would not expect hypotension to be persistent with vasovagal. Although pt did have a brief episode (a few seconds) of shaking, unlikely this was a seizure--more likely related to syncope. [facility]. Does not rule out CVA however would consider this unlikely given her intact neuro exam. Low risk for PE given chronic xarelto use--Well's 0. Troponins elevated but flat and no EKG findings to suggest ACS. She doesn't appear overly dry on exam however POCUS did have some findings consistent with depleted intravascular volume. Additionally, she is on 83mtorsemide at home and has had diminished po intake lately which may be have decreased  preload in setting of pulm hypertension.  Perfusion seems improved on exam. Currently on 35mg levophed.  Plan --IVF _0 /hr. Will re-evaluate volume status in  AM --titrate levophed for goal MAP >65. Currently only has peripheral access--will need to consider central line if pressor requirement persists or needs to be >10 --follow up formal echo --monitor for arrhythmias on tele --follow blood and urine cultures --hold home diuretics and antihypertensives for now  Idiopathic Pulmonary Fibrosis with pulmonary hypertension (last PASP 73). Follows with Dr. MLake Bellsin pulm clinic. Chronic hypoxic respiratory failure on 5L at baseline Plan: continue Ofev  Chronic right sided heart failure likely 2/2 IPF/PH. Follows with Dr. MAundra Dubinin the heart failure clinic. RHC planned for later this month however Dr. ALynetta Mareplanning to call to discuss getting this done during this hospitalization. Not appearing hypervolemic on exam as you would expect with right sided heart failure exacerbation.  Plan: hold home diuretics for now. Will need to monitor volume status closely given the need for IVF. Strict I/O. Daily weights.  Tropinemia in the setting of CAD. Last LHC in 2019 with 70% blockage in RCA however pt denies chest pain, dizziness at this time. Suspect this is due to demand ischemia.  Plan: f/u echo to evaluate for regional wall abnomalities. Appreciate cardiology recommendations.  Chron's disease: continue sulfalazine OSA: CPAP COPD. Last PFTs in 2019 showing moderate to severe obstructive disease.  CAD/carotid stenosis: continue xarelto Hypertension: hold home antihypertensives  Best practice:  Diet: HH Pain/Anxiety/Delirium protocol (if indicated): tylenol VAP protocol (if indicated): n/a DVT prophylaxis: xarelto GI prophylaxis: n/a Glucose control: n/a Mobility: BR Code Status: DNR/DNI Family Communication: daughters updated at bedside Disposition:   Labs   CBC: No results for input(s): WBC, NEUTROABS, HGB, HCT, MCV, PLT in the last 168 hours.  Basic Metabolic Panel: No results for input(s): NA, K, CL, CO2, GLUCOSE, BUN, CREATININE,  CALCIUM, MG, PHOS in the last 168 hours. GFR: CrCl cannot be calculated (Patient's most recent lab result is older than the maximum 21 days allowed.). No results for input(s): PROCALCITON, WBC, LATICACIDVEN in the last 168 hours.  Liver Function Tests: No results for input(s): AST, ALT, ALKPHOS, BILITOT, PROT, ALBUMIN in the last 168 hours. No results for input(s): LIPASE, AMYLASE in the last 168 hours. No results for input(s): AMMONIA in the last 168 hours.  ABG    Component Value Date/Time   HCO3 22.5 10/30/2017 1013   TCO2 24 10/30/2017 1013   ACIDBASEDEF 4.0 (H) 10/30/2017 1013   O2SAT 71.0 10/30/2017 1013     Coagulation Profile: No results for input(s): INR, PROTIME in the last 168 hours.  Cardiac Enzymes: No results for input(s): CKTOTAL, CKMB, CKMBINDEX, TROPONINI in the last 168 hours.  HbA1C: No results found for: HGBA1C  CBG: No results for input(s): GLUCAP in the last 168 hours.  Review of Systems:   Negative unless stated in HPI  Past Medical History  She,  has a past medical history of Anxiety, Carotid artery disease ([place], Cataract, COPD (chronic obstructive pulmonary disease) ([place], Coronary atherosclerosis of native coronary artery, Depression, DVT (deep venous thrombosis) ([place], Essential hypertension, benign, Fracture (Left Great Toe), Hepatic steatosis, History of melanoma, History of oral aphthous ulcers, History of Salmonella gastroenteritis, adenomatous colonic polyps, Mixed hyperlipidemia, Myocardial infarction ([place], Osteoarthritis, Osteoporosis, Pancreatitis, Rectal bleeding, Regional enteritis of large intestine ([place], Sinus polyp, TIA (transient ischemic attack), and Tubular adenoma of colon (08/2012).   Surgical History    Past Surgical History:  Procedure Laterality Date  .  CATARACT EXTRACTION Bilateral   . Melanoma resection  1996  . RIGHT/LEFT HEART CATH AND CORONARY ANGIOGRAPHY N/A 10/30/2017   Procedure: RIGHT/LEFT HEART CATH AND CORONARY  ANGIOGRAPHY;  Surgeon: Larey Dresser, MD;  Location: [facility];  Service: Cardiovascular;  Laterality: N/A;  . TOTAL KNEE ARTHROPLASTY Right Feb 2012     Social History   reports that she quit smoking about 2 years ago. Her smoking use included cigarettes. She started smoking about 62 years ago. She has a 40.00 pack-year smoking history. She has never used smokeless tobacco. She reports that she does not drink alcohol and does not use drugs.   Family History   Her family history includes Breast cancer in her sister; Deep vein thrombosis in her brother; Diabetes type II in her mother; Heart attack (age of onset: 48) in her mother; Heart disease in her mother; Hyperlipidemia in her sister; Hypertension in her sister; Other in her brother; Uterine cancer in her mother; Varicose Veins in her mother. There is no history of Colon cancer.   Allergies Allergies  Allergen Reactions  . Crestor [Rosuvastatin Calcium]     Muscle pain   . Doxycycline Other (See Comments)    Poss. Photosensitivity  . Mercaptopurine Other (See Comments)    REACTION: pancreatitis  . Simvastatin Other (See Comments)    Severe leg aches  . Sulfasalazine   . Penicillins Rash and Other (See Comments)    Has patient had a PCN reaction causing immediate rash, facial/tongue/throat swelling, SOB or lightheadedness with hypotension: No Has patient had a PCN reaction causing severe rash involving mucus membranes or skin necrosis: No Has patient had a PCN reaction that required hospitalization: No Has patient had a PCN reaction occurring within the last 10 years: No If all of the above answers are "NO", then may proceed with Cephalosporin use.   . Sulfa Antibiotics Rash     Home Medications  Prior to Admission medications   Medication Sig Start Date End Date Taking? Authorizing Provider  albuterol (PROAIR HFA) 108 (90 Base) MCG/ACT inhaler Inhale 2 puffs into the lungs every 4 (four) hours as needed for wheezing  or shortness of breath. 06/14/19   Parrett, Fonnie Mu, NP  aspirin 81 MG tablet Take 81 mg by mouth daily.      [provider]  calcium citrate-vitamin D (CITRACAL+D) 315-200 MG-UNIT tablet Take 1 tablet by mouth daily.     [provider]  colchicine 0.6 MG tablet Take 1 tablet (0.6 mg total) by mouth daily. 12/20/19   Larey Dresser, MD  ferrous sulfate 325 (65 FE) MG tablet Take 325 mg by mouth daily.    [provider]  FLUoxetine (PROZAC) 40 MG capsule Take 40 mg by mouth daily.    [provider]  folic acid (FOLVITE) 1 MG tablet TAKE 1 TABLET BY MOUTH EVERY DAY 02/11/19   Mauri Pole, MD  HYDROcodone-acetaminophen (NORCO) 10-325 MG tablet Take 1 tablet by mouth every 6 (six) hours as needed for moderate pain.     [provider]  metoprolol succinate (TOPROL-XL) 25 MG 24 hr tablet TAKE 1 TABLET BY MOUTH EVERY DAY 11/09/19   Satira Sark, MD  Misc Natural Products (OSTEO BI-FLEX JOINT SHIELD PO) Take 1 capsule by mouth daily.    [provider]  nitroGLYCERIN (NITROSTAT) 0.4 MG SL tablet Place 1 tablet (0.4 mg total) under the tongue every 5 (five) minutes x 3 doses as needed. 07/13/19  Larey Dresser, MD  OFEV 100 MG CAPS TAKE 1 CAPSULE TWICE A DAY 12/27/19   Mannam, Praveen, MD  Omega-3 Fatty Acids (FISH OIL) 1200 MG CAPS Take 1,200 mg by mouth daily.     [provider]  potassium chloride SA (KLOR-CON) 20 MEQ tablet TAKE 1 TABLET BY MOUTH TWICE DAILY 02/17/20   Larey Dresser, MD  predniSONE (DELTASONE) 10 MG tablet Take 34m (4 tabs) for 2 days, then 327m(3 tabs) for 2 days, then 2046m2 tabs) for 2 days then 82m63m tab) for 2 days 12/20/19   McLeLarey Dresser  spironolactone (ALDACTONE) 25 MG tablet TAKE 1 TABLET BY MOUTH EVERY DAY 12/10/19   McLeLarey Dresser  sulfaSALAzine (AZULFIDINE) 500 MG tablet Take 1 tablet (500 mg total) by mouth 2 (two) times daily. 03/13/17   NandMauri Pole  torsemide  (DEMADEX) 20 MG tablet Take 4 tablets (80 mg total) by mouth daily. 12/20/19 03/19/20  McLeLarey Dresser  XARELTO 2.5 MG TABS tablet TAKE 1 TABLET BY MOUTH TWICE DAILY 09/08/19   McLeLarey Dresser     RYLEMitzi Hansen INTERNAL MEDICINE RESIDENT PGY-1 _0 @ 3:41 PM

## 2020-02-28 NOTE — Consult Note (Signed)
Advanced Heart Failure Team Consult Note   Primary Physician: Leeanne Rio, MD PCP-Cardiologist:  Rozann Lesches, MD  Reason for Consultation: Pulmonary hypertension/RV failure  HPI:    Shelley [name] is seen today for evaluation of pulmonary hypertension at the request of Dr. Lynetta Mare.   79 y.o. with history of prior DVT, CAD, HTN was referred initially to me by Dr. Domenic Polite for evaluation of pulmonary hypertension/RV failure.  Patient was a long-time smoker, quit in 3/19. In 1/19, she began to develop peripheral edema.  This gradually worsened and Lasix was started.   In 3/19, I took her for right/left heart cath.  This showed 70% in-stent restenosis in the RCA, medically managed.  She had normal filling pressures on RHC with moderate pulmonary hypertension.  V/Q scan showed no chronic PE and PFTs showed moderate to severe obstructive defect.   She saw Dr. Lake Bells with pulmonary, and was diagnosed with both COPD and idiopathic pulmonary fibrosis.  She was started on Ofev.    Echo in 4/21 showed EF 55-60%, moderately dilated RV with moderately decreased systolic function, PASP 73 mmHg, moderate TR.   She has had a very difficult time with gout recently, was hospitalized at [facility] and then went to rehab facility for a number of weeks this spring due to gout pain and difficulty walking.  During this time, she lost close to 40 lbs due to poor appetite/early satiety. She was sitting on the commode today and developed lightheadedness and passed out.  She does not remember what happened after this.  There is a report of some seizure-like activity that seems to have been post-syncopal.  She woke up at [facility] in the ER.  CT head was negative.  She was then transferred to [facility].   Here, she has required a low dose of norepinephrine (currently at 4) to maintain MAP. She currently is comfortable at rest and converses normally.   Echo was done today and reviewed, EF 55%,  moderate LVH, D-shaped septum, RV severe enlarged with severely decreased systolic function, PASP 428 mmHg, moderate-severe TR, IVC dilated suggesting RA pressure 15 mmHg.   Review of Systems: All systems reviewed and negative except as per HPI.   Home Medications Prior to Admission medications   Medication Sig Start Date End Date Taking? Authorizing Provider  albuterol (PROAIR HFA) 108 (90 Base) MCG/ACT inhaler Inhale 2 puffs into the lungs every 4 (four) hours as needed for wheezing or shortness of breath. 06/14/19  Yes Parrett, Tammy S, NP  allopurinol (ZYLOPRIM) 100 MG tablet Take 100 mg by mouth every evening.   Yes [provider]  aspirin 81 MG tablet Take 81 mg by mouth daily.     Yes [provider]  colchicine 0.6 MG tablet Take 1 tablet (0.6 mg total) by mouth daily. Patient taking differently: Take 0.6 mg by mouth in the morning.  12/20/19  Yes Larey Dresser, MD  ferrous sulfate 325 (65 FE) MG tablet Take 325 mg by mouth daily.   Yes [provider]  FLUoxetine (PROZAC) 40 MG capsule Take 40 mg by mouth daily.   Yes [provider]  folic acid (FOLVITE) 1 MG tablet TAKE 1 TABLET BY MOUTH EVERY DAY Patient taking differently: Take 1 mg by mouth in the morning.  02/11/19  Yes Nandigam, Venia Minks, MD  HYDROcodone-acetaminophen (NORCO) 10-325 MG tablet Take 1 tablet by mouth every 6 (six) hours as needed for moderate pain.    Yes [provider]  megestrol (MEGACE) 20 MG tablet Take 20 mg by mouth 2 (two) times daily.   Yes [provider]  metoprolol succinate (TOPROL-XL) 25 MG 24 hr tablet TAKE 1 TABLET BY MOUTH EVERY DAY Patient taking differently: Take 25 mg by mouth in the morning.  11/09/19  Yes Satira Sark, MD  mirtazapine (REMERON) 15 MG tablet Take 7.5 mg by mouth at bedtime.   Yes [provider]  nitroGLYCERIN (NITROSTAT) 0.4 MG SL tablet Place 1 tablet (0.4 mg total) under the tongue every 5 (five) minutes  x 3 doses as needed. Patient taking differently: Place 0.4 mg under the tongue every 5 (five) minutes x 3 doses as needed for chest pain.  07/13/19  Yes Larey Dresser, MD  OFEV 100 MG CAPS TAKE 1 CAPSULE TWICE A DAY Patient taking differently: Take 100 mg by mouth in the morning and at bedtime.  12/27/19  Yes Mannam, Praveen, MD  Omega-3 Fatty Acids (FISH OIL) 1200 MG CAPS Take 1,200 mg by mouth 2 (two) times a week.    Yes [provider]  potassium chloride SA (KLOR-CON) 20 MEQ tablet TAKE 1 TABLET BY MOUTH TWICE DAILY Patient taking differently: Take 20 mEq by mouth 2 (two) times daily.  02/17/20  Yes Larey Dresser, MD  spironolactone (ALDACTONE) 25 MG tablet TAKE 1 TABLET BY MOUTH EVERY DAY Patient taking differently: Take 25 mg by mouth daily.  12/10/19  Yes Larey Dresser, MD  sulfaSALAzine (AZULFIDINE) 500 MG tablet Take 1 tablet (500 mg total) by mouth 2 (two) times daily. 03/13/17  Yes Nandigam, Venia Minks, MD  torsemide (DEMADEX) 20 MG tablet Take 4 tablets (80 mg total) by mouth daily. 12/20/19 03/19/20 Yes Larey Dresser, MD  XARELTO 2.5 MG TABS tablet TAKE 1 TABLET BY MOUTH TWICE DAILY Patient taking differently: Take 2.5 mg by mouth 2 (two) times daily.  09/08/19  Yes Larey Dresser, MD  calcium citrate-vitamin D (CITRACAL+D) 315-200 MG-UNIT tablet Take 1 tablet by mouth daily.  Patient not taking: Reported on 02/28/2020    [provider]  Misc Natural Products (OSTEO BI-FLEX JOINT SHIELD PO) Take 1 capsule by mouth daily. Patient not taking: Reported on 02/28/2020    [provider]    Past Medical History: 1. CAD: BMS to RCA in 2004.  - Cardiolite (12/16): EF 52%, fixed inferior defect with no ischemia.  - LHC (3/19): 70% in-stent restenosis in RCA stent, managed medically.  2. Depression 3. H/o DVT 4. HTN 5. Osteoarthritis: h/o R TKR.  6. H/o melanoma 7. Crohns disease: Sulfasalazine. 8. Hyperlipidemia 9. Carotid stenosis: Followed at [facility].  -  Carotid dopplers (11/18): RICA 93-23% stenosis, LICA 55-73% stenosis.  - Carotid dopplers (22/02): RICA 54-27%, LICA 06-23%.  - Carotid dopplers (6/20): RICA 76-28% stenosis, LICA 31-51% stenosis.  10. COPD: No longer smokes. -  PFTs (3/19): moderate-severe obstruction consistent with COPD. 11. Pulmonary hypertension/RV failure: Suspect primarily group 3.  Echo (1/19) with EF 55-60%, mild LVH, mild MR, moderate RV dilation with moderately decreased systolic function, moderate TR, PASP 74 mmHg.  - CTA chest (1/19): No PE, chronic fibrotic changes in the lungs, 6 mm RUL nodule.  - RHC (3/19): mean RA 2, PA 54/16 mean 28, mean PCWP 8, CI 2.55, PVR 3.9 WU.  - PFTs (3/19): moderate-severe obstruction consistent with COPD.  - V/Q scan (3/19): No evidence for chronic PE.  - Echo (3/20): EF 55-60%, moderately enlarged RV with moderately decreased  RV systolic function, PASP 87 mmHg, moderate TR, dilated IVC.  - Echo (4/21): EF 55-60%, moderately dilated RV with moderately decreased systolic function, PASP 73 mmHg, moderate TR.  12. TIA  13. PAD: Peripheral arterial dopplers (3/19) with totally occluded right AT, totally occluded left SFA, severe stenosis in left PT.  - ABIs (12/19): 0.73 on left, 1.05 on right.  - ABIs (6/20): Stable compared to prior.  14. Idiopathic pulmonary fibrosis: Followed by Dr. Lake Bells, she is on Ofev.  15. LBBB 16. Multinodular goiter 17. OSA: CPAP.  18. Gout  Past Surgical History: Past Surgical History:  Procedure Laterality Date  . CATARACT EXTRACTION Bilateral   . Melanoma resection  1996  . RIGHT/LEFT HEART CATH AND CORONARY ANGIOGRAPHY N/A 10/30/2017   Procedure: RIGHT/LEFT HEART CATH AND CORONARY ANGIOGRAPHY;  Surgeon: Larey Dresser, MD;  Location: [facility];  Service: Cardiovascular;  Laterality: N/A;  . TOTAL KNEE ARTHROPLASTY Right Feb 2012    Family History: Family History  Problem Relation Age of Onset  . Diabetes type II Mother   . Heart  attack Mother 48       questionable  . Uterine cancer Mother   . Heart disease Mother        before age 35  . Varicose Veins Mother   . Other Brother        PVD and hx DVT  . Deep vein thrombosis Brother   . Breast cancer Sister   . Hyperlipidemia Sister   . Hypertension Sister   . Colon cancer Neg Hx     Social History: Social History   Socioeconomic History  . Marital status: Divorced    Spouse name: Not on file  . Number of children: 2  . Years of education: Not on file  . Highest education level: Not on file  Occupational History  . Occupation: Retired    Fish farm manager: RETIRED  Tobacco Use  . Smoking status: Former Smoker    Packs/day: 1.00    Years: 40.00    Pack years: 40.00    Types: Cigarettes    Start date: 06/02/1957    Quit date: 09/2017    Years since quitting: 2.4  . Smokeless tobacco: Never Used  Vaping Use  . Vaping Use: Never used  Substance and Sexual Activity  . Alcohol use: No    Alcohol/week: 0.0 standard drinks  . Drug use: No  . Sexual activity: Not on file  Other Topics Concern  . Not on file  Social History Narrative  . Not on file   Social Determinants of Health   Financial Resource Strain:   . Difficulty of Paying Living Expenses:   Food Insecurity:   . Worried About Charity fundraiser in the Last Year:   . Arboriculturist in the Last Year:   Transportation Needs:   . Film/video editor (Medical):   Marland Kitchen Lack of Transportation (Non-Medical):   Physical Activity:   . Days of Exercise per Week:   . Minutes of Exercise per Session:   Stress:   . Feeling of Stress :   Social Connections:   . Frequency of Communication with Friends and Family:   . Frequency of Social Gatherings with Friends and Family:   . Attends Religious Services:   . Active Member of Clubs or Organizations:   . Attends Archivist Meetings:   Marland Kitchen Marital Status:     Allergies:  Allergies  Allergen Reactions  . Tape  Other (See Comments)    SKIN  HAS BECOME VERY THIN- TEARS EASILY!!  . Crestor [Rosuvastatin Calcium] Other (See Comments)    Muscle pain   . Doxycycline Other (See Comments)    Possible photosensitivity  . Mercaptopurine Other (See Comments)    Pancreatitis  . Simvastatin Other (See Comments)    Severe leg aches  . Penicillins Rash    Has patient had a PCN reaction causing immediate rash, facial/tongue/throat swelling, SOB or lightheadedness with hypotension: No Has patient had a PCN reaction causing severe rash involving mucus membranes or skin necrosis: No Has patient had a PCN reaction that required hospitalization: No Has patient had a PCN reaction occurring within the last 10 years: No If all of the above answers are "NO", then may proceed with Cephalosporin use.   . Sulfa Antibiotics Rash    Objective:    Vital Signs:   Pulse Rate:  [63-72] 68 (07/05 1800) Resp:  [18-23] 21 (07/05 1800) BP: (82-108)/(49-78) 101/78 (07/05 1800) SpO2:  [94 %-100 %] 100 % (07/05 1800) Weight:  [73.5 kg] 73.5 kg (07/05 1234)    Weight change: Filed Weights   02/28/20 1234  Weight: 73.5 kg    Intake/Output:   Intake/Output Summary (Last 24 hours) at 02/28/2020 1859 Last data filed at 02/28/2020 1854 Gross per 24 hour  Intake --  Output 100 ml  Net -100 ml      Physical Exam    General:  NAD HEENT: normal Neck: supple. JVP 14 cm. Carotids 2+ bilat; no bruits. No lymphadenopathy or thyromegaly appreciated. Cor: PMI nondisplaced. Regular rate & rhythm. No rubs, gallops or murmurs. Lungs: Dry crackles bilaterally.  Abdomen: soft, nontender, nondistended. No hepatosplenomegaly. No bruits or masses. Good bowel sounds. Extremities: no cyanosis, clubbing, rash, edema. Tophi finger joints.  Neuro: alert & orientedx3, cranial nerves grossly intact. moves all 4 extremities w/o difficulty. Affect pleasant   Telemetry   NSR in 70s (personally reviewed)  EKG    NSR, LAFB, RBBB (personally reviewed)  Labs   Basic  Metabolic Panel: Recent Labs  Lab 02/28/20 1653  NA 137  K 3.4*  CL 105  CO2 19*  GLUCOSE 88  BUN 19  CREATININE 1.15*  CALCIUM 8.1*    Liver Function Tests: Recent Labs  Lab 02/28/20 1653  AST 53*  ALT 27  ALKPHOS 64  BILITOT 0.8  PROT 5.3*  ALBUMIN 2.1*   No results for input(s): LIPASE, AMYLASE in the last 168 hours. No results for input(s): AMMONIA in the last 168 hours.  CBC: Recent Labs  Lab 02/28/20 1653  WBC 9.7  NEUTROABS 7.6  HGB 10.9*  HCT 34.3*  MCV 104.3*  PLT 286    Cardiac Enzymes: No results for input(s): CKTOTAL, CKMB, CKMBINDEX, TROPONINI in the last 168 hours.  BNP: BNP (last 3 results) Recent Labs    02/28/20 1653  BNP 2,646.6*    ProBNP (last 3 results) No results for input(s): PROBNP in the last 8760 hours.   CBG: Recent Labs  Lab 02/28/20 1613  GLUCAP 57*    Coagulation Studies: No results for input(s): LABPROT, INR in the last 72 hours.   Imaging   ECHOCARDIOGRAM LIMITED  Result Date: 02/28/2020    ECHOCARDIOGRAM LIMITED REPORT   Patient Name:   Shelley [name] Date of Exam: 02/28/2020 Medical Rec #:  [id number]         Height:       65.0 in Accession #:    [id number]  Weight:       162.0 lb Date of Birth:  1941-02-28         BSA:          1.809 m Patient Age:    38 years          BP:           91/49 mmHg Patient Gender: F                 HR:           71 bpm. Exam Location:  Inpatient Procedure: Limited Echo, Cardiac Doppler and Color Doppler Indications:    Syncope  History:        Patient has prior history of Echocardiogram examinations, most                 recent 11/30/2019. CAD, COPD; Risk Factors:Hypertension,                 Dyslipidemia, Former Smoker and Sleep Apnea. H/o DVT.  Sonographer:    Clayton Lefort RDCS (AE) Referring Phys: [place]  1. Left ventricular ejection fraction, by estimation, is 55%. There is moderate left ventricular hypertrophy. Left ventricular diastolic parameters are  indeterminate. There is the interventricular septum is flattened in systole and diastole, consistent with right ventricular pressure and volume overload.  2. Right ventricular systolic function is severely reduced. The right ventricular size is severely enlarged. Mildly increased right ventricular wall thickness. There is severely elevated pulmonary artery systolic pressure. The estimated right ventricular systolic pressure is 242.3 mmHg.  3. The mitral valve is abnormal. Trivial mitral valve regurgitation.  4. Tricuspid valve regurgitation is moderate to severe.  5. The aortic valve is tricuspid. Mild to moderate aortic valve sclerosis/calcification is present, without any evidence of aortic stenosis.  6. The pulmonic valve is mildly calcified.  7. The inferior vena cava is dilated in size with <50% respiratory variability, suggesting right atrial pressure of 15 mmHg. FINDINGS  Left Ventricle: Left ventricular ejection fraction, by estimation, is 55%. The left ventricular internal cavity size was normal in size. There is moderate left ventricular hypertrophy. The interventricular septum is flattened in systole and diastole, consistent with right ventricular pressure and volume overload. Right Ventricle: The right ventricular size is severely enlarged. Mildly increased right ventricular wall thickness. Right ventricular systolic function is severely reduced. There is severely elevated pulmonary artery systolic pressure. The tricuspid regurgitant velocity is 4.86 m/s, and with an assumed right atrial pressure of 15 mmHg, the estimated right ventricular systolic pressure is 536.1 mmHg. Mitral Valve: The mitral valve is abnormal. There is mild thickening of the mitral valve leaflet(s). Mild mitral annular calcification. Trivial mitral valve regurgitation. Tricuspid Valve: The tricuspid valve is grossly normal. Tricuspid valve regurgitation is moderate to severe. Aortic Valve: The aortic valve is tricuspid. Mild to  moderate aortic valve sclerosis/calcification is present, without any evidence of aortic stenosis. Moderate aortic valve annular calcification. Pulmonic Valve: The pulmonic valve is mildly calcified. The pulmonic valve was grossly normal. Pulmonic valve regurgitation is trivial. Venous: The inferior vena cava is dilated in size with less than 50% respiratory variability, suggesting right atrial pressure of 15 mmHg.  LEFT VENTRICLE PLAX 2D LVIDd:         3.20 cm LVIDs:         2.60 cm LV PW:         1.50 cm LV IVS:        1.50 cm  LVOT diam:     1.80 cm LVOT Area:     2.54 cm  LV Volumes (MOD) LV vol d, MOD A2C: 54.3 ml LV vol d, MOD A4C: 55.7 ml LV vol s, MOD A2C: 19.5 ml LV vol s, MOD A4C: 23.3 ml LV SV MOD A2C:     34.8 ml LV SV MOD A4C:     55.7 ml LV SV MOD BP:      32.0 ml RIGHT VENTRICLE            IVC RV S prime:     6.85 cm/s  IVC diam: 2.40 cm TAPSE (M-mode): 1.1 cm LEFT ATRIUM         Index LA diam:    2.50 cm 1.38 cm/m  TRICUSPID VALVE TR Peak grad:   94.5 mmHg TR Vmax:        486.00 cm/s  SHUNTS Systemic Diam: 1.80 cm Rozann Lesches MD Electronically signed by Rozann Lesches MD Signature Date/Time: 02/28/2020/5:43:42 PM    Final       Medications:     Current Medications: . aspirin EC  81 mg Oral Daily  . [START ON 02/29/2020] Chlorhexidine Gluconate Cloth  6 each Topical Q0600  . colchicine  0.6 mg Oral Daily  . ferrous sulfate  325 mg Oral Daily  . FLUoxetine  40 mg Oral Daily  . folic acid  1 mg Oral Daily  . Nintedanib  1 capsule Oral BID  . rivaroxaban  2.5 mg Oral BID  . sulfaSALAzine  500 mg Oral BID     Infusions: . sodium chloride    . sodium chloride    . norepinephrine (LEVOPHED) Adult infusion 5 mcg/min (02/28/20 1533)      Assessment/Plan   1. Syncope: I am concerned that this could have been due to severe RV failure and pulmonary hypertension.  She is mildly hypotensive here on a low dose of norepinephrine.  Echo shows severely dilated RV with severe RV  dysfunction and PASP 110 mmHg. She is volume overloaded on exam with elevated JVP and dilated IVC on echo (evidence for RV failure).  - Will follow on telemetry, but suspect that event was less likely arrhythmic and more likely a vagal event while using the bathroom in setting of severe RV failure/pulmonary hypertension.  - With low BP, she is on norepinephrine => continue for now. - Will add midodrine 5 mg tid given RV failure.  2. Elevated Troponin: Noted at [facility].  She does have history of CAD (h/o BMS to RCA in 2004; LHC in 3/19 showed 70% in-stent restenosis in the RCA) but suspect this was demand ischemia in setting of RV failure/volume overload.  - Continue ASA 81.  - She has extensive vascular disease.  Based on COMPASS study, I have her on rivaroxaban 2.5 mg bid.  -She has not been able to tolerate statins due to myalgias.  She has an appointment soon in lipid clinic to get started with Repatha.    3. RV failure with severe pulmonary hypertension: CTA chest in 1/19 showed chronic fibrotic changes in the lungs but not emphysema, IPF diagnosed by Dr. Lake Bells.  However, PFTs in 3/19 were suggestive of moderate-severe obstruction consistent with COPD.  RHC (3/19) showed moderate pulmonary arterial hypertension.  V/Q scan showed no evidence for chronic pulmonary embolus. Echo this admission with EF 55%, moderate LVH, D-shaped septum, RV severe enlarged with severely decreased systolic function, PASP 001 mmHg, moderate-severe TR, IVC dilated suggesting RA pressure  15 mmHg.  She is thought to have group 3 PH due to COPD and interstitial lung disease (IPF), she has OSA as well.  We had been planning repeat RHC with eye toward starting Tyvaso based on INCREASE trial in ILD.  On exam, she is volume overloaded.  BNP is high and IVC is dilated on echo done today.  IV septum is left-shifted.  I think that diuresis may help offload RV and improve LV mechanics as well (less compression of LV).   -  Lasix 80 mg IV bid, watching BP closely.  - As above, continue low dose NE as needed and add midodrine.  I suspect she will need midodrine long-term with severe RV dysfunction.  - RHC after some diuresis, we can consider Tyvaso addition based on INCREASE trial but may be too little, too late at this point.  4. Pulmonary fibrosis: IPF.  She is now on Ofev.  5. Carotid stenosis: Followed by [facility]. 6. PAD: Peripheral arterial dopplers (3/19) showed occluded left SFA. She has bilateral leg pain, but pain is not typical for claudication.   - Sees Dr. Oneida Alar, he has so far recommended medical management.  - She has quit smoking.  7. COPD: She has now quit smoking.  8. Gout: Severe gout recently, currently quiescent.  - Continue colchicine, will need allopurinol eventually.  9. Weight loss: Poor appetite/early satiety, ?due to RV failure at least in part.   Length of Stay: 0  Loralie Champagne, MD  02/28/2020, 6:59 PM  Advanced Heart Failure Team Pager [phone] (M-F; 7a - 4p)  Please contact [facility] for night-coverage after hours (4p -7a ) and weekends on [email or web address]

## 2020-02-28 NOTE — Progress Notes (Signed)
  Echocardiogram 2D Echocardiogram has been performed.  Shelley [name] 02/28/2020, 4:06 PM

## 2020-02-28 NOTE — Consult Note (Addendum)
Cardiology Consultation:   Patient ID: Shelley [name] MRN: [id number]; DOB: Aug 10, 1941  Admit date: 02/28/2020 Date of Consult: 02/28/2020  Primary Care Provider: Leeanne Rio, MD [facility] Cardiologist: Rozann Lesches, MD   [facility] Electrophysiologist:  None   Advanced Heart Failure Clinic:  Loralie Champagne, MD     Patient Profile:   Shelley [name] is a 79 y.o. female with a hx of diastolic CHF with prominent RV failure, pulmonary hypertension, COPD, idiopathic pulmonary fibrosis, coronary artery disease s/p prior BMS to RCA, carotid stenosis, Peripheral Arterial Disease who is being seen today for the evaluation of syncope at the request of Dr. Lynetta Mare.  History of Present Illness:   Shelley [name] is followed chronically by Dr. Aundra Dubin in the [facility].  She had a R and L cardiac catheterization in 10/2017 that demonstrated 70% ISR in the RCA that is managed medically.  She had mod pulmonary hypertension on RHC.  A VQ scan was neg for chronic PE.  She had PFTs with mod to severe obstructive defect.  She has since been dx with COPD and ILD.  She is now on Ofev.  Echocardiogram in 11/2019 demonstrated EF 55-60 and PASP 73, moderately decreased RVSF.  She is on home O2 (5L during the day and CPAP at night).  She was last seen in the HF Clinic in 11/2019.  She was to be set up for a follow up [place].  However, this was placed on hold due to exacerbation of gout.   She was transferred from [facility] to [facility] today.  She presented via EMS for possible CVA.  She had witnessed seizure activity followed by unresponsiveness, sats in the 80s and low BP (80/52).  She was given fluids and her pressure improved.   Lactate 1.8 Lipase 159 Troponin I 0.405 >> 0.099 UA mod blood, mod bacteria, +WBCs, few yeast SARS-CoV-2 neg Hgb 12.4, WBC 14.2, MCV 104.7 Mg 1.5, K 4.3, SCr 1.52, Alb 2.2 CT abd/pelvis: small hiatal hernia, bilat renal cysts, lumbar DDD, aortic  atherosclerosis CXR: chronic interstitial lung markings; no acute infiltrate, effusion, PTX Head CT: no acute abnormality  She notes that she had spent a couple of weeks in the hospital for gout and then was DC to SNF.  She was DC to home a couple of weeks ago.  She has been working with [facility].  She has lost 40+ lbs since she started dealing with gout.  She has no appetite.  She was doing ok but started to feel poorly yesterday.  She was lightheaded and suddenly felt more shortness of breath.  She was sitting on the bedside commode when she passed out.  She does not remember anything after this.  She woke up in the ED at [facility].  She has not been having chest pain.  Overall, her breathing has been stable.  Her CPAP no longer fits her at night due to the weight loss.    Past Medical Hx: 1. CAD: BMS to RCA in 2004.  - Cardiolite (12/16): EF 52%, fixed inferior defect with no ischemia.  - LHC (3/19): 70% in-stent restenosis in RCA stent, managed medically.  2. Depression 3. H/o DVT 4. HTN 5. Osteoarthritis: h/o R TKR.  6. H/o melanoma 7. Crohns disease: Sulfasalazine. 8. Hyperlipidemia 9. Carotid stenosis: Followed at [facility].  - Carotid dopplers (11/18): RICA 01-09% stenosis, LICA 32-35% stenosis.  - Carotid dopplers (57/32): RICA 20-25%, LICA 42-70%.  - Carotid dopplers (6/20): RICA 60-79% stenosis,  LICA 33-43% stenosis.  10. COPD: No longer smokes. -  PFTs (3/19): moderate-severe obstruction consistent with COPD. 11. Pulmonary hypertension/RV failure: Suspect primarily group 3.  Echo (1/19) with EF 55-60%, mild LVH, mild MR, moderate RV dilation with moderately decreased systolic function, moderate TR, PASP 74 mmHg.  - CTA chest (1/19): No PE, chronic fibrotic changes in the lungs, 6 mm RUL nodule.  - RHC (3/19): mean RA 2, PA 54/16 mean 28, mean PCWP 8, CI 2.55, PVR 3.9 WU.  - PFTs (3/19): moderate-severe obstruction consistent with COPD.  - V/Q scan (3/19): No evidence for chronic PE.   - Echo (3/20): EF 55-60%, moderately enlarged RV with moderately decreased RV systolic function, PASP 87 mmHg, moderate TR, dilated IVC.  - Echo (4/21): EF 55-60%, moderately dilated RV with moderately decreased systolic function, PASP 73 mmHg, moderate TR.  12. TIA  13. PAD: Peripheral arterial dopplers (3/19) with totally occluded right AT, totally occluded left SFA, severe stenosis in left PT.  - ABIs (12/19): 0.73 on left, 1.05 on right.  - ABIs (6/20): Stable compared to prior.  14. Idiopathic pulmonary fibrosis: Followed by Dr. Lake Bells, she is on Ofev.  15. LBBB 16. Multinodular goiter 17. OSA: CPAP.  18. Gout  Past Surgical History:  Procedure Laterality Date  . CATARACT EXTRACTION Bilateral   . Melanoma resection  1996  . RIGHT/LEFT HEART CATH AND CORONARY ANGIOGRAPHY N/A 10/30/2017   Procedure: RIGHT/LEFT HEART CATH AND CORONARY ANGIOGRAPHY;  Surgeon: Larey Dresser, MD;  Location: [place] [facility];  Service: Cardiovascular;  Laterality: N/A;  . TOTAL KNEE ARTHROPLASTY Right Feb 2012     Home Medications:  Prior to Admission medications   Medication Sig Start Date End Date Taking? Authorizing Provider  albuterol (PROAIR HFA) 108 (90 Base) MCG/ACT inhaler Inhale 2 puffs into the lungs every 4 (four) hours as needed for wheezing or shortness of breath. 06/14/19   Parrett, Fonnie Mu, NP  aspirin 81 MG tablet Take 81 mg by mouth daily.      [provider]  calcium citrate-vitamin D (CITRACAL+D) 315-200 MG-UNIT tablet Take 1 tablet by mouth daily.     [provider]  colchicine 0.6 MG tablet Take 1 tablet (0.6 mg total) by mouth daily. 12/20/19   Larey Dresser, MD  ferrous sulfate 325 (65 FE) MG tablet Take 325 mg by mouth daily.    [provider]  FLUoxetine (PROZAC) 40 MG capsule Take 40 mg by mouth daily.    [provider]  folic acid (FOLVITE) 1 MG tablet TAKE 1 TABLET BY MOUTH EVERY DAY 02/11/19   Mauri Pole, MD   HYDROcodone-acetaminophen (NORCO) 10-325 MG tablet Take 1 tablet by mouth every 6 (six) hours as needed for moderate pain.     [provider]  metoprolol succinate (TOPROL-XL) 25 MG 24 hr tablet TAKE 1 TABLET BY MOUTH EVERY DAY 11/09/19   Satira Sark, MD  Misc Natural Products (OSTEO BI-FLEX JOINT SHIELD PO) Take 1 capsule by mouth daily.    [provider]  nitroGLYCERIN (NITROSTAT) 0.4 MG SL tablet Place 1 tablet (0.4 mg total) under the tongue every 5 (five) minutes x 3 doses as needed. 07/13/19   Larey Dresser, MD  OFEV 100 MG CAPS TAKE 1 CAPSULE TWICE A DAY 12/27/19   Mannam, Hart Robinsons, MD  Omega-3 Fatty Acids (FISH OIL) 1200 MG CAPS Take 1,200 mg by mouth daily.     [provider]  potassium chloride  SA (KLOR-CON) 20 MEQ tablet TAKE 1 TABLET BY MOUTH TWICE DAILY 02/17/20   Larey Dresser, MD  predniSONE (DELTASONE) 10 MG tablet Take 67m (4 tabs) for 2 days, then 375m(3 tabs) for 2 days, then 2062m2 tabs) for 2 days then 64m50m tab) for 2 days 12/20/19   McLeLarey Dresser  spironolactone (ALDACTONE) 25 MG tablet TAKE 1 TABLET BY MOUTH EVERY DAY 12/10/19   McLeLarey Dresser  sulfaSALAzine (AZULFIDINE) 500 MG tablet Take 1 tablet (500 mg total) by mouth 2 (two) times daily. 03/13/17   NandMauri Pole  torsemide (DEMADEX) 20 MG tablet Take 4 tablets (80 mg total) by mouth daily. 12/20/19 03/19/20  McLeLarey Dresser  XARELTO 2.5 MG TABS tablet TAKE 1 TABLET BY MOUTH TWICE DAILY 09/08/19   McLeLarey Dresser    Inpatient Medications: Scheduled Meds:  Continuous Infusions:  PRN Meds:   Allergies:    Allergies  Allergen Reactions  . Crestor [Rosuvastatin Calcium]     Muscle pain   . Doxycycline Other (See Comments)    Poss. Photosensitivity  . Mercaptopurine Other (See Comments)    REACTION: pancreatitis  . Simvastatin Other (See Comments)    Severe leg aches  . Sulfasalazine   . Penicillins Rash and Other (See Comments)    Has  patient had a PCN reaction causing immediate rash, facial/tongue/throat swelling, SOB or lightheadedness with hypotension: No Has patient had a PCN reaction causing severe rash involving mucus membranes or skin necrosis: No Has patient had a PCN reaction that required hospitalization: No Has patient had a PCN reaction occurring within the last 10 years: No If all of the above answers are "NO", then may proceed with Cephalosporin use.   . Sulfa Antibiotics Rash    Social History:   Social History   Socioeconomic History  . Marital status: Divorced    Spouse name: Not on file  . Number of children: 2  . Years of education: Not on file  . Highest education level: Not on file  Occupational History  . Occupation: Retired    EmplFish farm managerTIRED  Tobacco Use  . Smoking status: Former Smoker    Packs/day: 1.00    Years: 40.00    Pack years: 40.00    Types: Cigarettes    Start date: 06/02/1957    Quit date: 09/2017    Years since quitting: 2.4  . Smokeless tobacco: Never Used  Vaping Use  . Vaping Use: Never used  Substance and Sexual Activity  . Alcohol use: No    Alcohol/week: 0.0 standard drinks  . Drug use: No  . Sexual activity: Not on file  Other Topics Concern  . Not on file  Social History Narrative  . Not on file   Social Determinants of Health   Financial Resource Strain:   . Difficulty of Paying Living Expenses:   Food Insecurity:   . Worried About RunnCharity fundraiserthe Last Year:   . Ran Arboriculturistthe Last Year:   Transportation Needs:   . LackFilm/video editordical):   . LaMarland Kitchenk of Transportation (Non-Medical):   Physical Activity:   . Days of Exercise per Week:   . Minutes of Exercise per Session:   Stress:   . Feeling of Stress :   Social Connections:   . Frequency of Communication with Friends and Family:   . Frequency of Social Gatherings with Friends and Family:   .  Attends Religious Services:   . Active Member of Clubs or Organizations:    . Attends Archivist Meetings:   Marland Kitchen Marital Status:   Intimate Partner Violence:   . Fear of Current or Ex-Partner:   . Emotionally Abused:   Marland Kitchen Physically Abused:   . Sexually Abused:     Family History:    Family History  Problem Relation Age of Onset  . Diabetes type II Mother   . Heart attack Mother 72       questionable  . Uterine cancer Mother   . Heart disease Mother        before age 29  . Varicose Veins Mother   . Other Brother        PVD and hx DVT  . Deep vein thrombosis Brother   . Breast cancer Sister   . Hyperlipidemia Sister   . Hypertension Sister   . Colon cancer Neg Hx      ROS:  Please see the history of present illness.  +dysuria No cough, melena, hematochezia.  All other ROS reviewed and negative.     Physical Exam/Data:   Vitals:   02/28/20 1234  BP: (!) 82/59  Pulse: 72  Temp: 98 F (36.7 C)  TempSrc: Oral  SpO2: 98%   No intake or output data in the 24 hours ending 02/28/20 1240 Last 3 Weights 12/20/2019 11/01/2019 10/18/2019  Weight (lbs) 180 lb 12.8 oz 190 lb 3.2 oz 186 lb 9.6 oz  Weight (kg) 82.01 kg 86.274 kg 84.641 kg     There is no height or weight on file to calculate BMI.  General:  Well nourished, well developed, in no acute distress  HEENT: normal Neck: no JVD Lymph: no adenopathy Endocrine:  No thryomegaly Cardiac:  normal S1, S2; RRR; no murmur   Lungs:  Dry bibasilar crackles  Abd: soft, nontender Ext: 1+ bilat edema Musculoskeletal:  Tophi noted R middle finger Skin: warm and dry  Neuro:  CNs 2-12 intact, no focal abnormalities noted Psych:  Normal affect     Assessment and Plan:   1.  Syncope:   Etiology not entirely clear.  She has lost a significant amount of weight since her troubles with gout started.  She has been on large doses of diuretics (Torsemide 80).  Her creatinine is s/w increased.  Question if this may be due to over diuresis and decreased preload in the setting of severe pulmonary  hypertension.  She also has some bacteria and WBCs on her UA.  Question if this may be due to infection/UTI.  Cannot rule out primary arrhythmia.  Will follow her on Tele.  Get ECG.  Order limited echocardiogram to recheck EF.  Hold diuretics for now.   2. Elevated Troponin: She has hx of coronary artery disease and she has 70% ISR in the RCA on L cardiac catheterization in 2019.  But she has not had chest pain and has been tx medically.  Her troponins are minimally elevated.  This is likely all due to demand ischemia.  Obtain limited echocardiogram as noted.   3. Chronic diastolic CHF w prominent RV failure 4. Pulmonary hypertension  She is followed in the HF Clinic.  She was to have a follow up [place] but this was put on hold due to her admission for gout.  It may be worthwhile involving the CHF team prior to DC to see if the [place] should be done while she is here.  For questions or updates, please contact [place] Please consult [email or web address] for contact info under    Signed, Richardson Dopp, PA-C  02/28/2020 12:40 PM    Attending note:  Patient seen and examined.  I reviewed her records and discussed the case with Mr. Jorene Minors.  Shelley [name] has a history of severe pulmonary hypertension in the setting of pulmonary fibrosis with RV failure followed by Dr. Aundra Dubin.  She also has a history of CAD status post BMS to the RCA as well as PAD and COPD.  She presents in transfer from [facility] recalling that she had a syncopal event while sitting on the commode preceded by feeling of shortness of breath.  Reportedly, some type of seizure activity was witnessed however this could have been post syncopal, she was found to be hypotensive thereafter.  Initial lactate 1.8, troponin I 0.405, UA with potential evidence of UTI, SARS coronavirus 2 test negative.  Outside chest x-ray described interstitial course lung markings but no infiltrate or effusions.  She has been home for a few weeks after SNF  stay for physical therapy.  Reports limited appetite, has lost about 40 pounds over the last several weeks.  On examination in the ICU she is awake and alert on Levophed with systolics 99V to low 444P.  Heart rate in the 70s in sinus rhythm.  Lungs exhibit dry crackles without wheezing.  Cardiac exam with RRR apical systolic murmur, she has chronic appearing leg edema.  Skin irritation sacral area.  Follow-up lab work is currently pending.  ECG shows sinus rhythm with incomplete right branch block and leftward axis.  Episode of apparent syncope and subsequently documented hypotension, etiology not yet clarified.  She is on Levophed with plan to wean, systolics in the 84K to low 100s.  Creatinine is above prior baseline, there is also question of potential UTI and therefore sepsis.  She does have significant RV failure with severe pulmonary hypertension and is therefore somewhat preload dependent as well.  Continue telemetry monitoring, cycle high-sensitivity troponin I levels, check repeat lactate and also a procalcitonin level, repeat UA.  Hold diuretics for now.  We will get a follow-up limited echocardiogram to ensure no significant change in LV function.  Continue to follow, may ultimately request input from the advanced heart failure team depending on course.  She had been considered for follow-up right heart catheterization since her last assessment.  Satira Sark, M.D., F.A.C.C.

## 2020-02-28 NOTE — Progress Notes (Signed)
[facility] Progress Note Patient Name: Shelley [name] DOB: Jul 10, 1941 MRN: [id number]   Date of Service  02/28/2020  HPI/Events of Note  Frequent watery stools - Request for Flexiseal.   eICU Interventions  Plan: 1. Place Flexiseal.      Intervention Category Major Interventions: Other:  Lysle Dingwall 02/28/2020, 9:49 PM

## 2020-02-29 ENCOUNTER — Other Ambulatory Visit: Payer: Self-pay

## 2020-02-29 ENCOUNTER — Encounter (HOSPITAL_COMMUNITY): Payer: Self-pay | Admitting: Pulmonary Disease

## 2020-02-29 DIAGNOSIS — E440 Moderate protein-calorie malnutrition: Secondary | ICD-10-CM

## 2020-02-29 DIAGNOSIS — J449 Chronic obstructive pulmonary disease, unspecified: Secondary | ICD-10-CM

## 2020-02-29 LAB — CBC
HCT: 33.4 % — ABNORMAL LOW (ref 36.0–46.0)
Hemoglobin: 10.7 g/dL — ABNORMAL LOW (ref 12.0–15.0)
MCH: 33 pg (ref 26.0–34.0)
MCHC: 32 g/dL (ref 30.0–36.0)
MCV: 103.1 fL — ABNORMAL HIGH (ref 80.0–100.0)
Platelets: 273 10*3/uL (ref 150–400)
RBC: 3.24 MIL/uL — ABNORMAL LOW (ref 3.87–5.11)
RDW: 16.8 % — ABNORMAL HIGH (ref 11.5–15.5)
WBC: 10 10*3/uL (ref 4.0–10.5)
nRBC: 0 % (ref 0.0–0.2)

## 2020-02-29 LAB — BASIC METABOLIC PANEL
Anion gap: 13 (ref 5–15)
Anion gap: 9 (ref 5–15)
BUN: 18 mg/dL (ref 8–23)
BUN: 16 mg/dL (ref 8–23)
CO2: 21 mmol/L — ABNORMAL LOW (ref 22–32)
CO2: 24 mmol/L (ref 22–32)
Calcium: 8.3 mg/dL — ABNORMAL LOW (ref 8.9–10.3)
Calcium: 7.9 mg/dL — ABNORMAL LOW (ref 8.9–10.3)
Chloride: 106 mmol/L (ref 98–111)
Chloride: 103 mmol/L (ref 98–111)
Creatinine, Ser: 0.91 mg/dL (ref 0.44–1.00)
Creatinine, Ser: 0.98 mg/dL (ref 0.44–1.00)
GFR calc Af Amer: >60 mL/min (ref >60)
GFR calc Af Amer: >60 mL/min (ref >60)
GFR calc non Af Amer: 60 mL/min — ABNORMAL LOW (ref >60)
GFR calc non Af Amer: 54 mL/min — ABNORMAL LOW (ref >60)
Glucose, Bld: 94 mg/dL (ref 70–99)
Glucose, Bld: 136 mg/dL — ABNORMAL HIGH (ref 70–99)
Potassium: 3.1 mmol/L — ABNORMAL LOW (ref 3.5–5.1)
Potassium: 4 mmol/L (ref 3.5–5.1)
Sodium: 140 mmol/L (ref 135–145)
Sodium: 136 mmol/L (ref 135–145)

## 2020-02-29 LAB — IRON AND TIBC
Iron: 49 ug/dL (ref 28–170)
Saturation Ratios: 27 % (ref 10.4–31.8)
TIBC: 182 ug/dL — ABNORMAL LOW (ref 250–450)
UIBC: 133 ug/dL

## 2020-02-29 LAB — URIC ACID: Uric Acid, Serum: 8.1 mg/dL — ABNORMAL HIGH (ref 2.5–7.1)

## 2020-02-29 LAB — GLUCOSE, CAPILLARY: Glucose-Capillary: 108 mg/dL — ABNORMAL HIGH (ref 70–99)

## 2020-02-29 LAB — MAGNESIUM
Magnesium: 1.2 mg/dL — ABNORMAL LOW (ref 1.7–2.4)
Magnesium: 2 mg/dL (ref 1.7–2.4)

## 2020-02-29 MED ORDER — ENSURE ENLIVE PO LIQD
237.0000 mL | Freq: Two times a day (BID) | ORAL | Status: DC
  Administered 2020-02-29 – 2020-03-06 (×12): 237 mL via ORAL

## 2020-02-29 MED ORDER — ZINC OXIDE 12.8 % EX OINT
TOPICAL_OINTMENT | CUTANEOUS | Status: DC | PRN
  Filled 2020-02-29: qty 56.7

## 2020-02-29 MED ORDER — SODIUM CHLORIDE 0.9% FLUSH
3.0000 mL | Freq: Two times a day (BID) | INTRAVENOUS | Status: DC
  Administered 2020-02-29 – 2020-03-08 (×16): 3 mL via INTRAVENOUS

## 2020-02-29 MED ORDER — POTASSIUM CHLORIDE 10 MEQ/100ML IV SOLN
10.0000 meq | INTRAVENOUS | Status: AC
  Administered 2020-02-29 (×4): 10 meq via INTRAVENOUS
  Filled 2020-02-29 (×3): qty 100

## 2020-02-29 MED ORDER — MAGNESIUM SULFATE 4 GM/100ML IV SOLN
4.0000 g | Freq: Once | INTRAVENOUS | Status: AC
  Administered 2020-02-29: 4 g via INTRAVENOUS
  Filled 2020-02-29: qty 100

## 2020-02-29 MED ORDER — POTASSIUM CHLORIDE CRYS ER 20 MEQ PO TBCR
20.0000 meq | EXTENDED_RELEASE_TABLET | ORAL | Status: AC
  Administered 2020-02-29 (×2): 20 meq via ORAL
  Filled 2020-02-29 (×2): qty 1

## 2020-02-29 MED ORDER — MIDODRINE HCL 5 MG PO TABS
7.5000 mg | ORAL_TABLET | Freq: Three times a day (TID) | ORAL | Status: DC
  Administered 2020-02-29 – 2020-03-08 (×24): 7.5 mg via ORAL
  Filled 2020-02-29 (×25): qty 2

## 2020-02-29 MED ORDER — MAGNESIUM SULFATE 2 GM/50ML IV SOLN
2.0000 g | Freq: Once | INTRAVENOUS | Status: AC
  Administered 2020-02-29: 2 g via INTRAVENOUS
  Filled 2020-02-29: qty 50

## 2020-02-29 MED ORDER — ORAL CARE MOUTH RINSE
15.0000 mL | Freq: Two times a day (BID) | OROMUCOSAL | Status: DC
  Administered 2020-02-29 – 2020-03-08 (×13): 15 mL via OROMUCOSAL

## 2020-02-29 MED ORDER — HYDROCODONE-ACETAMINOPHEN 5-325 MG PO TABS
1.0000 | ORAL_TABLET | ORAL | Status: DC | PRN
  Administered 2020-02-29 – 2020-03-07 (×5): 1 via ORAL
  Filled 2020-02-29 (×5): qty 1

## 2020-02-29 NOTE — Progress Notes (Signed)
[facility] ADULT ICU REPLACEMENT PROTOCOL   The patient does apply for the [facility] Adult ICU Electrolyte Replacment Protocol based on the criteria listed below:   1. Is GFR >/= 30 ml/min? Yes.    Patient's GFR today is 60 2. Is SCr </= 2? Yes.   Patient's SCr is 0.91 ml/kg/hr 3. Did SCr increase >/= 0.5 in 24 hours? 4. Abnormal electrolyte(s): K+3.1 Mag 1.25. Ordered repletion with: protocol 6. If a panic level lab has been reported, has the CCM MD in charge been notified? Yes.  .   Physician:  Dr.Sommer  Carlisle Beers 02/29/2020 5:11 AM

## 2020-02-29 NOTE — Progress Notes (Signed)
NAME:  Shelley [name], MRN:  [id number], DOB:  1940-12-29, LOS: 1 ADMISSION DATE:  02/28/2020, CONSULTATION DATE:  02/28/20 REFERRING MD:  Wallene Huh, CHIEF COMPLAINT:  hypotension   Brief History   54 yof with hypertension, HLD, HFpEF, CAD s/p stent placement, IPF with pulm hypertension, Chrohn's disease, and depression who was transferred from [facility] ED for syncope>hypotension requiring pressor support.   History of present illness   79 yof with hypertension, HLD, HFpEF, CAD s/p BMS to RCA in 2004, IPF with pulm hypertension, Chrohn's disease, and depression who initially presented to [facility] ED on 02/28/20 following a syncopal event at home. Pt and her two daughters are the primary historian Pt recalls having been sitting on the toliet for about 28mn prior to the episode. She denies any prodromal symptoms preceding the event. The next thing she remembered was waking up at the hospital. The daughters recall her sitting on the toilet and suddenly going stiff and having jerking movements for a few seconds prior to becoming limp. The daughter caught her from falling. She also notes that her lips turned blue and extremities cold.  When EMS arrived, they noted a blood pressure of 56/27. Upon arrival to the ED, levophed was started with improvement in pressures.  [facility]  Past Medical History  hypertension, HLD, HFpEF, CAD s/p BMS to RCA in 2004, carotid stenosis, COPD IPF with pulm hypertension, Crohn's disease, and depression   Significant Hospital Events   7/4 [facility] Consults:  Cardiology HF  Procedures:  flexiseal 7/5>>  Significant Diagnostic Tests:  [facility]: 7/4 CXR>> mild, diffuse, chronic appearing increased interstitial lung markings. No evidence of acute infiltrate.  7/4 head CT>>no acute findings 7/4 abd/pelvis CT>>small hiatal hernia, bilateral renal cysts--no acute  findings.  [facility]5 echocardi[facility]>> LVEF 55%, moderate LVH, interventricular septum flattening; severe reduction in RV systolic function, severe RV dilation, est. RV systolic pressure 1767.3 severely elevated PASP; mod-severe tricuspid regurg; mild-mod AV sclerosis; IVC dilated with <50% variability suggesting RAP of 176mg  Micro Data:  7/5 blood culture>>NGTD 7/5 urine culture>>NGTD 7/5 Resp culture>>few gram positive cocci, rare gram neg rod  Antimicrobials:  n/a  Interim history/subjective:  Frequent watery stools overnight>flexiseal placed  Objective   Blood pressure (!) 109/52, pulse 66, temperature 98.6 F (37 C), temperature source Axillary, resp. rate 16, weight 74.7 kg, SpO2 96 %.        Intake/Output Summary (Last 24 hours) at 02/29/2020 0657 Last data filed at 02/29/2020 0600 Gross per 24 hour  Intake 777.21 ml  Output 1100 ml  Net -322.79 ml   Filed Weights   02/28/20 1234 02/29/20 0500  Weight: 73.5 kg 74.7 kg    Examination: General: well appearing female HENT: MMM Lungs: breathing comfortably on [place]. Bibasilar fine crackles. Cardiovascular: RRR, no LE edema. Peripheries warm Abdomen: soft, nontender, bs active Extremities: moves all four extremities Neuro: a/o  Resolved Hospital Problem list   n/a  Assessment & Plan:   Syncope. Suspect 2/2 acute on chronic cor pulmonale Acute on Chronic right sided heart failure/Idiopathic Pulmonary Fibrosis with pulmonary hypertension (last PASP 73). Echo on this admission showing PASP of 11050m with interventricular septal flattening and severe dilation and systolic dysfunction of RV.  Although increased volume status suspected, she is still down about 10kg from March Levo down to 5mc40mhis am Plan --Advanced heart failure planning for RHC when volume status  improves--possibly Wednesday. Recommending diuresis to hopefully offload RV and improve LV mechanics --d/c IVF --40m lasix BID per  AHF --continue to wean levo as able--afraid diuresis may initially preclude this however will hopefully improve as LV volume does. MAP goal >65. Midodrine 552madded per AHF --continue Ofev for IPF --follow blood and urine cultures  Acute macrocytic anemia. Appearing that baseline hgb is in the 12s--10.7 on admission. Possibly ACD. No sign of active bleeding. Plan --f/u iron panel  --continue to monitor with daily labs.  Hypokalemia. K 3.1 this morning.  Hypomagnesemia. 1.2 this morning.  Plan: continue to monitor and replete as needed  Tropinemia in the setting of CAD. Last LHC in 2019 with 70% blockage in RCA however pt denies chest pain, dizziness at this time. Suspect this is due to demand ischemia.   Gout: pt noting recurrent attacks despite being on medical therapy with colchicine and allipurinol. Uric acid 8.1 on admission. Continue allopurinol and colchicine Bilateral heel wounds. Pt noting 2/2 gout. Continue dressing changes. Monitor for infection. Chron's disease: continue sulfalazine OSA: CPAP COPD. Last PFTs in 2019 showing moderate to severe obstructive disease.  CAD/carotid stenosis: continue xarelto Hypertension: hold home antihypertensives Chronic hypoxic respiratory failure due to COPD/IPF on 5L at baseline. Remains on baseline supplemental O2.  Best practice:  Diet: HH Pain/Anxiety/Delirium protocol (if indicated): tylenol VAP protocol (if indicated): n/a DVT prophylaxis: xarelto GI prophylaxis: n/a Glucose control: n/a Mobility: BR Code Status: DNR/DNI Family Communication: daughters updated at bedside Disposition: ICU  Labs   CBC: Recent Labs  Lab 02/28/20 1653 02/29/20 0240  WBC 9.7 10.0  NEUTROABS 7.6  --   HGB 10.9* 10.7*  HCT 34.3* 33.4*  MCV 104.3* 103.1*  PLT 286 27165  Basic Metabolic Panel: Recent Labs  Lab 02/28/20 1653 02/29/20 0240  NA 137 140  K 3.4* 3.1*  CL 105 106  CO2 19* 21*  GLUCOSE 88 94  BUN 19 18  CREATININE 1.15*  0.91  CALCIUM 8.1* 8.3*  MG  --  1.2*   GFR: Estimated Creatinine Clearance: 49.9 mL/min (by C-G formula based on SCr of 0.91 mg/dL). Recent Labs  Lab 02/28/20 1653 02/28/20 2057 02/29/20 0240  WBC 9.7  --  10.0  LATICACIDVEN 1.6 1.9  --     Liver Function Tests: Recent Labs  Lab 02/28/20 1653  AST 53*  ALT 27  ALKPHOS 64  BILITOT 0.8  PROT 5.3*  ALBUMIN 2.1*   No results for input(s): LIPASE, AMYLASE in the last 168 hours. No results for input(s): AMMONIA in the last 168 hours.  ABG    Component Value Date/Time   HCO3 22.5 10/30/2017 1013   TCO2 24 10/30/2017 1013   ACIDBASEDEF 4.0 (H) 10/30/2017 1013   O2SAT 71.0 10/30/2017 1013     Coagulation Profile: No results for input(s): INR, PROTIME in the last 168 hours.  Cardiac Enzymes: No results for input(s): CKTOTAL, CKMB, CKMBINDEX, TROPONINI in the last 168 hours.  HbA1C: No results found for: HGBA1C  CBG: Recent Labs  Lab 02/28/20 1613 02/28/20 2302  GLUCAP [id number]    Review of Systems:   Negative unless stated in HPI  Past Medical History  She,  has a past medical history of Anxiety, Carotid artery disease ([place], COPD (chronic obstructive pulmonary disease) ([facility] Coronary atherosclerosis of native coronary artery, Depression, DVT (deep venous thrombosis) ([facility] Essential hypertension, benign, Fracture (Left Great Toe), Hepatic steatosis, History of melanoma, History of oral aphthous ulcers, History of  Salmonella gastroenteritis, adenomatous colonic polyps, Mixed hyperlipidemia, Myocardial infarction ([place]), Osteoarthritis, Osteoporosis, Pancreatitis, Rectal bleeding, Regional enteritis of large intestine ([place]), Sinus polyp, TIA (transient ischemic attack), and Tubular adenoma of colon (08/2012).   Surgical History    Past Surgical History:  Procedure Laterality Date  . CATARACT EXTRACTION Bilateral   . Melanoma resection  1996  . RIGHT/LEFT HEART CATH AND CORONARY ANGIOGRAPHY N/A 10/30/2017    Procedure: RIGHT/LEFT HEART CATH AND CORONARY ANGIOGRAPHY;  Surgeon: Larey Dresser, MD;  Location: [facility];  Service: Cardiovascular;  Laterality: N/A;  . TOTAL KNEE ARTHROPLASTY Right Feb 2012     Social History   reports that she quit smoking about 2 years ago. Her smoking use included cigarettes. She started smoking about 62 years ago. She has a 40.00 pack-year smoking history. She has never used smokeless tobacco. She reports that she does not drink alcohol and does not use drugs.   Family History   Her family history includes Breast cancer in her sister; Deep vein thrombosis in her brother; Diabetes type II in her mother; Heart attack (age of onset: 34) in her mother; Heart disease in her mother; Hyperlipidemia in her sister; Hypertension in her sister; Other in her brother; Uterine cancer in her mother; Varicose Veins in her mother. There is no history of Colon cancer.   Allergies Allergies  Allergen Reactions  . Tape Other (See Comments)    SKIN HAS BECOME VERY THIN- TEARS EASILY!!  . Crestor [Rosuvastatin Calcium] Other (See Comments)    Muscle pain   . Doxycycline Other (See Comments)    Possible photosensitivity  . Mercaptopurine Other (See Comments)    Pancreatitis  . Simvastatin Other (See Comments)    Severe leg aches  . Penicillins Rash    Has patient had a PCN reaction causing immediate rash, facial/tongue/throat swelling, SOB or lightheadedness with hypotension: No Has patient had a PCN reaction causing severe rash involving mucus membranes or skin necrosis: No Has patient had a PCN reaction that required hospitalization: No Has patient had a PCN reaction occurring within the last 10 years: No If all of the above answers are "NO", then may proceed with Cephalosporin use.   . Sulfa Antibiotics Rash     Home Medications  Prior to Admission medications   Medication Sig Start Date End Date Taking? Authorizing Provider  albuterol (PROAIR HFA) 108 (90  Base) MCG/ACT inhaler Inhale 2 puffs into the lungs every 4 (four) hours as needed for wheezing or shortness of breath. 06/14/19   Parrett, Fonnie Mu, NP  aspirin 81 MG tablet Take 81 mg by mouth daily.      [provider]  calcium citrate-vitamin D (CITRACAL+D) 315-200 MG-UNIT tablet Take 1 tablet by mouth daily.     [provider]  colchicine 0.6 MG tablet Take 1 tablet (0.6 mg total) by mouth daily. 12/20/19   Larey Dresser, MD  ferrous sulfate 325 (65 FE) MG tablet Take 325 mg by mouth daily.    [provider]  FLUoxetine (PROZAC) 40 MG capsule Take 40 mg by mouth daily.    [provider]  folic acid (FOLVITE) 1 MG tablet TAKE 1 TABLET BY MOUTH EVERY DAY 02/11/19   Mauri Pole, MD  HYDROcodone-acetaminophen (NORCO) 10-325 MG tablet Take 1 tablet by mouth every 6 (six) hours as needed for moderate pain.     [provider]  metoprolol succinate (TOPROL-XL) 25 MG 24 hr tablet TAKE 1 TABLET BY  MOUTH EVERY DAY 11/09/19   Satira Sark, MD  Misc Natural Products (OSTEO BI-FLEX JOINT SHIELD PO) Take 1 capsule by mouth daily.    [provider]  nitroGLYCERIN (NITROSTAT) 0.4 MG SL tablet Place 1 tablet (0.4 mg total) under the tongue every 5 (five) minutes x 3 doses as needed. 07/13/19   Larey Dresser, MD  OFEV 100 MG CAPS TAKE 1 CAPSULE TWICE A DAY 12/27/19   Mannam, Hart Robinsons, MD  Omega-3 Fatty Acids (FISH OIL) 1200 MG CAPS Take 1,200 mg by mouth daily.     [provider]  potassium chloride SA (KLOR-CON) 20 MEQ tablet TAKE 1 TABLET BY MOUTH TWICE DAILY 02/17/20   Larey Dresser, MD  predniSONE (DELTASONE) 10 MG tablet Take 67m (4 tabs) for 2 days, then 343m(3 tabs) for 2 days, then 2039m2 tabs) for 2 days then 33m88m tab) for 2 days 12/20/19   McLeLarey Dresser  spironolactone (ALDACTONE) 25 MG tablet TAKE 1 TABLET BY MOUTH EVERY DAY 12/10/19   McLeLarey Dresser  sulfaSALAzine (AZULFIDINE) 500 MG tablet Take 1  tablet (500 mg total) by mouth 2 (two) times daily. 03/13/17   NandMauri Pole  torsemide (DEMADEX) 20 MG tablet Take 4 tablets (80 mg total) by mouth daily. 12/20/19 03/19/20  McLeLarey Dresser  XARELTO 2.5 MG TABS tablet TAKE 1 TABLET BY MOUTH TWICE DAILY 09/08/19   McLeLarey Dresser     RYLEMitzi Hansen INTERNAL MEDICINE RESIDENT PGY-1 _0 @ 6:57 AM

## 2020-02-29 NOTE — Progress Notes (Signed)
Patient encouraged to get out of bed several times today but refused due to being tired and/or pain in her feet.

## 2020-02-29 NOTE — Progress Notes (Signed)
Patient ID: Shelley [name], female   DOB: 1941/02/22, 79 y.o.   MRN: [id number]     Advanced Heart Failure Rounding Note  PCP-Cardiologist: Rozann Lesches, MD   Subjective:    Good diuresis overnight.  Breathing ok.  Still no appetite.    She remains on norepinephrine 5, has been started on midodrine 5 mg tid. K and Mg low today.   Having some watery stool.    Objective:   Weight Range: 74.7 kg Body mass index is 27.4 kg/m.   Vital Signs:   Temp:  [97.4 F (36.3 C)-98.6 F (37 C)] 98 F (36.7 C) (07/06 0700) Pulse Rate:  [61-140] 65 (07/06 0700) Resp:  [15-24] 21 (07/06 0700) BP: (73-116)/(37-78) 115/54 (07/06 0700) SpO2:  [94 %-100 %] 99 % (07/06 0700) Weight:  [73.5 kg-74.7 kg] 74.7 kg (07/06 0500) Last BM Date: 02/28/20  Weight change: Filed Weights   02/28/20 1234 02/29/20 0500  Weight: 73.5 kg 74.7 kg    Intake/Output:   Intake/Output Summary (Last 24 hours) at 02/29/2020 6812 Last data filed at 02/29/2020 0600 Gross per 24 hour  Intake 777.21 ml  Output 2100 ml  Net -1322.79 ml      Physical Exam    General:  Well appearing. No resp difficulty HEENT: Normal Neck: Supple. JVP 14 cm. Carotids 2+ bilat; no bruits. No lymphadenopathy or thyromegaly appreciated. Cor: PMI nondisplaced. Regular rate & rhythm. No rubs, gallops or murmurs. Lungs: Clear Abdomen: Soft, nontender, nondistended. No hepatosplenomegaly. No bruits or masses. Good bowel sounds. Extremities: No cyanosis, clubbing, rash, trace edema Neuro: Alert & orientedx3, cranial nerves grossly intact. moves all 4 extremities w/o difficulty. Affect pleasant   Telemetry   NSR 70s (personally reviewed)   Labs    CBC Recent Labs    02/28/20 1653 02/29/20 0240  WBC 9.7 10.0  NEUTROABS 7.6  --   HGB 10.9* 10.7*  HCT 34.3* 33.4*  MCV 104.3* 103.1*  PLT 286 751   Basic Metabolic Panel Recent Labs    02/28/20 1653 02/29/20 0240  NA 137 140  K 3.4* 3.1*  CL 105 106  CO2 19* 21*    GLUCOSE 88 94  BUN 19 18  CREATININE 1.15* 0.91  CALCIUM 8.1* 8.3*  MG  --  1.2*   Liver Function Tests Recent Labs    02/28/20 1653  AST 53*  ALT 27  ALKPHOS 64  BILITOT 0.8  PROT 5.3*  ALBUMIN 2.1*   No results for input(s): LIPASE, AMYLASE in the last 72 hours. Cardiac Enzymes No results for input(s): CKTOTAL, CKMB, CKMBINDEX, TROPONINI in the last 72 hours.  BNP: BNP (last 3 results) Recent Labs    02/28/20 1653  BNP 2,646.6*    ProBNP (last 3 results) No results for input(s): PROBNP in the last 8760 hours.   D-Dimer No results for input(s): DDIMER in the last 72 hours. Hemoglobin A1C No results for input(s): HGBA1C in the last 72 hours. Fasting Lipid Panel No results for input(s): CHOL, HDL, LDLCALC, TRIG, CHOLHDL, LDLDIRECT in the last 72 hours. Thyroid Function Tests No results for input(s): TSH, T4TOTAL, T3FREE, THYROIDAB in the last 72 hours.  Invalid input(s): FREET3  Other results:   Imaging    ECHOCARDIOGRAM LIMITED  Result Date: 02/28/2020    ECHOCARDIOGRAM LIMITED REPORT   Patient Name:   Shelley [name] Date of Exam: 02/28/2020 Medical Rec #:  [id number]         Height:  65.0 in Accession #:    [id number]        Weight:       162.0 lb Date of Birth:  [birth year]         BSA:          1.809 m Patient Age:    28 years          BP:           91/49 mmHg Patient Gender: F                 HR:           71 bpm. Exam Location:  Inpatient Procedure: Limited Echo, Cardiac Doppler and Color Doppler Indications:    Syncope  History:        Patient has prior history of Echocardiogram examinations, most                 recent 11/30/2019. CAD, COPD; Risk Factors:Hypertension,                 Dyslipidemia, Former Smoker and Sleep Apnea. H/o DVT.  Sonographer:    Clayton Lefort RDCS (AE) Referring Phys: [place]  1. Left ventricular ejection fraction, by estimation, is 55%. There is moderate left ventricular hypertrophy. Left ventricular  diastolic parameters are indeterminate. There is the interventricular septum is flattened in systole and diastole, consistent with right ventricular pressure and volume overload.  2. Right ventricular systolic function is severely reduced. The right ventricular size is severely enlarged. Mildly increased right ventricular wall thickness. There is severely elevated pulmonary artery systolic pressure. The estimated right ventricular systolic pressure is 740.8 mmHg.  3. The mitral valve is abnormal. Trivial mitral valve regurgitation.  4. Tricuspid valve regurgitation is moderate to severe.  5. The aortic valve is tricuspid. Mild to moderate aortic valve sclerosis/calcification is present, without any evidence of aortic stenosis.  6. The pulmonic valve is mildly calcified.  7. The inferior vena cava is dilated in size with <50% respiratory variability, suggesting right atrial pressure of 15 mmHg. FINDINGS  Left Ventricle: Left ventricular ejection fraction, by estimation, is 55%. The left ventricular internal cavity size was normal in size. There is moderate left ventricular hypertrophy. The interventricular septum is flattened in systole and diastole, consistent with right ventricular pressure and volume overload. Right Ventricle: The right ventricular size is severely enlarged. Mildly increased right ventricular wall thickness. Right ventricular systolic function is severely reduced. There is severely elevated pulmonary artery systolic pressure. The tricuspid regurgitant velocity is 4.86 m/s, and with an assumed right atrial pressure of 15 mmHg, the estimated right ventricular systolic pressure is 144.8 mmHg. Mitral Valve: The mitral valve is abnormal. There is mild thickening of the mitral valve leaflet(s). Mild mitral annular calcification. Trivial mitral valve regurgitation. Tricuspid Valve: The tricuspid valve is grossly normal. Tricuspid valve regurgitation is moderate to severe. Aortic Valve: The aortic valve  is tricuspid. Mild to moderate aortic valve sclerosis/calcification is present, without any evidence of aortic stenosis. Moderate aortic valve annular calcification. Pulmonic Valve: The pulmonic valve is mildly calcified. The pulmonic valve was grossly normal. Pulmonic valve regurgitation is trivial. Venous: The inferior vena cava is dilated in size with less than 50% respiratory variability, suggesting right atrial pressure of 15 mmHg.  LEFT VENTRICLE PLAX 2D LVIDd:         3.20 cm LVIDs:         2.60 cm LV PW:  1.50 cm LV IVS:        1.50 cm LVOT diam:     1.80 cm LVOT Area:     2.54 cm  LV Volumes (MOD) LV vol d, MOD A2C: 54.3 ml LV vol d, MOD A4C: 55.7 ml LV vol s, MOD A2C: 19.5 ml LV vol s, MOD A4C: 23.3 ml LV SV MOD A2C:     34.8 ml LV SV MOD A4C:     55.7 ml LV SV MOD BP:      32.0 ml RIGHT VENTRICLE            IVC RV S prime:     6.85 cm/s  IVC diam: 2.40 cm TAPSE (M-mode): 1.1 cm LEFT ATRIUM         Index LA diam:    2.50 cm 1.38 cm/m  TRICUSPID VALVE TR Peak grad:   94.5 mmHg TR Vmax:        486.00 cm/s  SHUNTS Systemic Diam: 1.80 cm Rozann Lesches MD Electronically signed by Rozann Lesches MD Signature Date/Time: 02/28/2020/5:43:42 PM    Final       Medications:     Scheduled Medications: . aspirin EC  81 mg Oral Daily  . Chlorhexidine Gluconate Cloth  6 each Topical Q0600  . colchicine  0.6 mg Oral Daily  . ferrous sulfate  325 mg Oral Daily  . FLUoxetine  40 mg Oral Daily  . folic acid  1 mg Oral Daily  . furosemide  80 mg Intravenous BID  . midodrine  5 mg Oral TID WC  . Nintedanib  1 capsule Oral BID  . potassium chloride  20 mEq Oral Q4H  . rivaroxaban  2.5 mg Oral BID  . sulfaSALAzine  500 mg Oral BID     Infusions: . sodium chloride    . sodium chloride    . magnesium sulfate bolus IVPB    . norepinephrine (LEVOPHED) Adult infusion 5 mcg/min (02/29/20 0600)  . potassium chloride 10 mEq (02/29/20 0657)     PRN Medications:  docusate sodium, polyethylene  glycol   Assessment/Plan   1. Syncope: I am concerned that this could have been due to severe RV failure and pulmonary hypertension.  She is mildly hypotensive here on a low dose of norepinephrine.  Echo shows severely dilated RV with severe RV dysfunction and PASP 110 mmHg. She is volume overloaded on exam with elevated JVP and dilated IVC on echo (evidence for RV failure). No significant arrhythmias noted so far.  - Will follow on telemetry, but suspect that event was less likely arrhythmic and more likely a vagal event while using the bathroom in setting of severe RV failure/pulmonary hypertension.  - With low BP, she is on norepinephrine => continue for now, wean as able. - Added midodrine given RV failure.  2. Elevated Troponin: Noted at [facility]  She does have history of CAD (h/o BMS to RCA in 2004; LHC in 3/19 showed 70% in-stent restenosis in the RCA) but suspect this was demand ischemia in setting of RV failure/volume overload.  - Continue ASA 81.  - She has extensive vascular disease. Based on COMPASS study, I have her on rivaroxaban 2.5 mg bid.  -She has not been able to tolerate statins due to myalgias. She has an appointment soon inlipid clinic to get started with Repatha.  3. RV failure with severe pulmonary hypertension: CTA chest in 1/19 showed chronic fibrotic changes in the lungs but not emphysema, IPF diagnosed by Dr.  McQuaid. However, PFTs in 3/19 were suggestive of moderate-severe obstruction consistent with COPD. RHC (3/19) showed moderate pulmonary arterial hypertension. V/Q scan showed no evidence for chronic pulmonary embolus. Echo this admission with EF 55%, moderate LVH, D-shaped septum, RV severe enlarged with severely decreased systolic function, PASP 245 mmHg, moderate-severe TR, IVC dilated suggesting RA pressure 15 mmHg.  She is thought to have group 3 PH due to COPD and interstitial lung disease (IPF), she has OSA as well.  We had been planning repeat  RHC with eye toward starting Tyvaso based on INCREASE trial in [facility].  On exam, she is still volume overloaded.  BNP is high and IVC was dilated on echo with IV septum left-shifted.  I think that diuresis may help offload RV and improve LV mechanics as well (less compression of LV).   - Lasix 80 mg IV bid today, watching BP closely. Aggressive replacement of K and Mg, repeat BMET in pm.  - As above, titrate NE down as able. - I suspect she will need midodrine long-term with severe RV dysfunction, increase to 7.5 mg tid today.  - RHC tomorrow, we would like to eventually add Tyvaso based on INCREASE trial if PCWP is not significantly elevated but may be too little, too late at this point. Discussed risks/benefits of procedure and she agrees.  4. Pulmonary fibrosis: IPF. She is now on Ofev.  5. Carotid stenosis: Followed by [facility]. 6. PAD: Peripheral arterial dopplers (3/19) showed occluded left SFA.She has bilateral leg pain, but pain is not typical for claudication.  - Sees Dr. Oneida Alar, he has so far recommended medical management.  - She has quit smoking.  7. COPD: She has now quit smoking.  8. Gout: Severe gout recently, currently quiescent.  - Continue colchicine, will need allopurinol eventually.  9. Weight loss: Poor appetite/early satiety, ?due to RV failure at least in part.  - Ensure bid.   Length of Stay: 1  Loralie Champagne, MD  02/29/2020, 8:22 AM  Advanced Heart Failure Team Pager [phone] (M-F; 7a - 4p)  Please contact [facility] for night-coverage after hours (4p -7a ) and weekends on [email or web address]

## 2020-02-29 NOTE — H&P (View-Only) (Signed)
Patient ID: Shelley [name], female   DOB: 01/16/1941, 79 y.o.   MRN: [id number]     Advanced Heart Failure Rounding Note  PCP-Cardiologist: Samuel McDowell, MD   Subjective:    Good diuresis overnight.  Breathing ok.  Still no appetite.    She remains on norepinephrine 5, has been started on midodrine 5 mg tid. K and Mg low today.   Having some watery stool.    Objective:   Weight Range: 74.7 kg Body mass index is 27.4 kg/m.   Vital Signs:   Temp:  [97.4 F (36.3 C)-98.6 F (37 C)] 98 F (36.7 C) (07/06 0700) Pulse Rate:  [61-140] 65 (07/06 0700) Resp:  [15-24] 21 (07/06 0700) BP: (73-116)/(37-78) 115/54 (07/06 0700) SpO2:  [94 %-100 %] 99 % (07/06 0700) Weight:  [73.5 kg-74.7 kg] 74.7 kg (07/06 0500) Last BM Date: 02/28/20  Weight change: Filed Weights   02/28/20 1234 02/29/20 0500  Weight: 73.5 kg 74.7 kg    Intake/Output:   Intake/Output Summary (Last 24 hours) at 02/29/2020 0822 Last data filed at 02/29/2020 0600 Gross per 24 hour  Intake 777.21 ml  Output 2100 ml  Net -1322.79 ml      Physical Exam    General:  Well appearing. No resp difficulty HEENT: Normal Neck: Supple. JVP 14 cm. Carotids 2+ bilat; no bruits. No lymphadenopathy or thyromegaly appreciated. Cor: PMI nondisplaced. Regular rate & rhythm. No rubs, gallops or murmurs. Lungs: Clear Abdomen: Soft, nontender, nondistended. No hepatosplenomegaly. No bruits or masses. Good bowel sounds. Extremities: No cyanosis, clubbing, rash, trace edema Neuro: Alert & orientedx3, cranial nerves grossly intact. moves all 4 extremities w/o difficulty. Affect pleasant   Telemetry   NSR 70s (personally reviewed)   Labs    CBC Recent Labs    02/28/20 1653 02/29/20 0240  WBC 9.7 10.0  NEUTROABS 7.6  --   HGB 10.9* 10.7*  HCT 34.3* 33.4*  MCV 104.3* 103.1*  PLT 286 273   Basic Metabolic Panel Recent Labs    02/28/20 1653 02/29/20 0240  NA 137 140  K 3.4* 3.1*  CL 105 106  CO2 19* 21*    GLUCOSE 88 94  BUN 19 18  CREATININE 1.15* 0.91  CALCIUM 8.1* 8.3*  MG  --  1.2*   Liver Function Tests Recent Labs    02/28/20 1653  AST 53*  ALT 27  ALKPHOS 64  BILITOT 0.8  PROT 5.3*  ALBUMIN 2.1*   No results for input(s): LIPASE, AMYLASE in the last 72 hours. Cardiac Enzymes No results for input(s): CKTOTAL, CKMB, CKMBINDEX, TROPONINI in the last 72 hours.  BNP: BNP (last 3 results) Recent Labs    02/28/20 1653  BNP 2,646.6*    ProBNP (last 3 results) No results for input(s): PROBNP in the last 8760 hours.   D-Dimer No results for input(s): DDIMER in the last 72 hours. Hemoglobin A1C No results for input(s): HGBA1C in the last 72 hours. Fasting Lipid Panel No results for input(s): CHOL, HDL, LDLCALC, TRIG, CHOLHDL, LDLDIRECT in the last 72 hours. Thyroid Function Tests No results for input(s): TSH, T4TOTAL, T3FREE, THYROIDAB in the last 72 hours.  Invalid input(s): FREET3  Other results:   Imaging    ECHOCARDIOGRAM LIMITED  Result Date: 02/28/2020    ECHOCARDIOGRAM LIMITED REPORT   Patient Name:   Shelley [name] Date of Exam: 02/28/2020 Medical Rec #:  [id number]         Height:         65.0 in Accession #:    [id number]        Weight:       162.0 lb Date of Birth:  07/11/1941         BSA:          1.809 m Patient Age:    79 years          BP:           91/49 mmHg Patient Gender: F                 HR:           71 bpm. Exam Location:  Inpatient Procedure: Limited Echo, Cardiac Doppler and Color Doppler Indications:    Syncope  History:        Patient has prior history of Echocardiogram examinations, most                 recent 11/30/2019. CAD, COPD; Risk Factors:Hypertension,                 Dyslipidemia, Former Smoker and Sleep Apnea. H/o DVT.  Sonographer:    Arthur Guy RDCS (AE) Referring Phys: [id number] SCOTT T WEAVER IMPRESSIONS  1. Left ventricular ejection fraction, by estimation, is 55%. There is moderate left ventricular hypertrophy. Left ventricular  diastolic parameters are indeterminate. There is the interventricular septum is flattened in systole and diastole, consistent with right ventricular pressure and volume overload.  2. Right ventricular systolic function is severely reduced. The right ventricular size is severely enlarged. Mildly increased right ventricular wall thickness. There is severely elevated pulmonary artery systolic pressure. The estimated right ventricular systolic pressure is 109.5 mmHg.  3. The mitral valve is abnormal. Trivial mitral valve regurgitation.  4. Tricuspid valve regurgitation is moderate to severe.  5. The aortic valve is tricuspid. Mild to moderate aortic valve sclerosis/calcification is present, without any evidence of aortic stenosis.  6. The pulmonic valve is mildly calcified.  7. The inferior vena cava is dilated in size with <50% respiratory variability, suggesting right atrial pressure of 15 mmHg. FINDINGS  Left Ventricle: Left ventricular ejection fraction, by estimation, is 55%. The left ventricular internal cavity size was normal in size. There is moderate left ventricular hypertrophy. The interventricular septum is flattened in systole and diastole, consistent with right ventricular pressure and volume overload. Right Ventricle: The right ventricular size is severely enlarged. Mildly increased right ventricular wall thickness. Right ventricular systolic function is severely reduced. There is severely elevated pulmonary artery systolic pressure. The tricuspid regurgitant velocity is 4.86 m/s, and with an assumed right atrial pressure of 15 mmHg, the estimated right ventricular systolic pressure is 109.5 mmHg. Mitral Valve: The mitral valve is abnormal. There is mild thickening of the mitral valve leaflet(s). Mild mitral annular calcification. Trivial mitral valve regurgitation. Tricuspid Valve: The tricuspid valve is grossly normal. Tricuspid valve regurgitation is moderate to severe. Aortic Valve: The aortic valve  is tricuspid. Mild to moderate aortic valve sclerosis/calcification is present, without any evidence of aortic stenosis. Moderate aortic valve annular calcification. Pulmonic Valve: The pulmonic valve is mildly calcified. The pulmonic valve was grossly normal. Pulmonic valve regurgitation is trivial. Venous: The inferior vena cava is dilated in size with less than 50% respiratory variability, suggesting right atrial pressure of 15 mmHg.  LEFT VENTRICLE PLAX 2D LVIDd:         3.20 cm LVIDs:         2.60 cm LV PW:           1.50 cm LV IVS:        1.50 cm LVOT diam:     1.80 cm LVOT Area:     2.54 cm  LV Volumes (MOD) LV vol d, MOD A2C: 54.3 ml LV vol d, MOD A4C: 55.7 ml LV vol s, MOD A2C: 19.5 ml LV vol s, MOD A4C: 23.3 ml LV SV MOD A2C:     34.8 ml LV SV MOD A4C:     55.7 ml LV SV MOD BP:      32.0 ml RIGHT VENTRICLE            IVC RV S prime:     6.85 cm/s  IVC diam: 2.40 cm TAPSE (M-mode): 1.1 cm LEFT ATRIUM         Index LA diam:    2.50 cm 1.38 cm/m  TRICUSPID VALVE TR Peak grad:   94.5 mmHg TR Vmax:        486.00 cm/s  SHUNTS Systemic Diam: 1.80 cm Samuel Mcdowell MD Electronically signed by Samuel Mcdowell MD Signature Date/Time: 02/28/2020/5:43:42 PM    Final       Medications:     Scheduled Medications: . aspirin EC  81 mg Oral Daily  . Chlorhexidine Gluconate Cloth  6 each Topical Q0600  . colchicine  0.6 mg Oral Daily  . ferrous sulfate  325 mg Oral Daily  . FLUoxetine  40 mg Oral Daily  . folic acid  1 mg Oral Daily  . furosemide  80 mg Intravenous BID  . midodrine  5 mg Oral TID WC  . Nintedanib  1 capsule Oral BID  . potassium chloride  20 mEq Oral Q4H  . rivaroxaban  2.5 mg Oral BID  . sulfaSALAzine  500 mg Oral BID     Infusions: . sodium chloride    . sodium chloride    . magnesium sulfate bolus IVPB    . norepinephrine (LEVOPHED) Adult infusion 5 mcg/min (02/29/20 0600)  . potassium chloride 10 mEq (02/29/20 0657)     PRN Medications:  docusate sodium, polyethylene  glycol   Assessment/Plan   1. Syncope: I am concerned that this could have been due to severe RV failure and pulmonary hypertension.  She is mildly hypotensive here on a low dose of norepinephrine.  Echo shows severely dilated RV with severe RV dysfunction and PASP 110 mmHg. She is volume overloaded on exam with elevated JVP and dilated IVC on echo (evidence for RV failure). No significant arrhythmias noted so far.  - Will follow on telemetry, but suspect that event was less likely arrhythmic and more likely a vagal event while using the bathroom in setting of severe RV failure/pulmonary hypertension.  - With low BP, she is on norepinephrine => continue for now, wean as able. - Added midodrine given RV failure.  2. Elevated Troponin: Noted at [facility].  She does have history of CAD (h/o BMS to RCA in 2004; LHC in 3/19 showed 70% in-stent restenosis in the RCA) but suspect this was demand ischemia in setting of RV failure/volume overload.  - Continue ASA 81.  - She has extensive vascular disease. Based on COMPASS study, I have her on rivaroxaban 2.5 mg bid.  -She has not been able to tolerate statins due to myalgias. She has an appointment soon inlipid clinic to get started with Repatha.  3. RV failure with severe pulmonary hypertension: CTA chest in 1/19 showed chronic fibrotic changes in the lungs but not emphysema, IPF diagnosed by Dr.   McQuaid. However, PFTs in 3/19 were suggestive of moderate-severe obstruction consistent with COPD. RHC (3/19) showed moderate pulmonary arterial hypertension. V/Q scan showed no evidence for chronic pulmonary embolus. Echo this admission with EF 55%, moderate LVH, D-shaped septum, RV severe enlarged with severely decreased systolic function, PASP 585 mmHg, moderate-severe TR, IVC dilated suggesting RA pressure 15 mmHg.  She is thought to have group 3 PH due to COPD and interstitial lung disease (IPF), she has OSA as well.  We had been planning repeat  RHC with eye toward starting Tyvaso based on INCREASE trial in [facility].  On exam, she is still volume overloaded.  BNP is high and IVC was dilated on echo with IV septum left-shifted.  I think that diuresis may help offload RV and improve LV mechanics as well (less compression of LV).   - Lasix 80 mg IV bid today, watching BP closely. Aggressive replacement of K and Mg, repeat BMET in pm.  - As above, titrate NE down as able. - I suspect she will need midodrine long-term with severe RV dysfunction, increase to 7.5 mg tid today.  - RHC tomorrow, we would like to eventually add Tyvaso based on INCREASE trial if PCWP is not significantly elevated but may be too little, too late at this point. Discussed risks/benefits of procedure and she agrees.  4. Pulmonary fibrosis: IPF. She is now on Ofev.  5. Carotid stenosis: Followed by [facility]. 6. PAD: Peripheral arterial dopplers (3/19) showed occluded left SFA.She has bilateral leg pain, but pain is not typical for claudication.  - Sees Dr. Oneida Alar, he has so far recommended medical management.  - She has quit smoking.  7. COPD: She has now quit smoking.  8. Gout: Severe gout recently, currently quiescent.  - Continue colchicine, will need allopurinol eventually.  9. Weight loss: Poor appetite/early satiety, ?due to RV failure at least in part.  - Ensure bid.   Length of Stay: 1  Loralie Champagne, MD  02/29/2020, 8:22 AM  Advanced Heart Failure Team Pager [phone] (M-F; 7a - 4p)  Please contact [facility] for night-coverage after hours (4p -7a ) and weekends on [email or web address]

## 2020-02-29 NOTE — Progress Notes (Signed)
NAME:  Shelley [name], MRN:  [id number], DOB:  1941-04-11, LOS: 1 ADMISSION DATE:  02/28/2020, CONSULTATION DATE:  02/29/20 REFERRING MD:  , CHIEF COMPLAINT:  Syncope   Brief History   Shelley [name] is an 79 y.o. female with a PMH of HTN, HLD, HFpEF, CAD s/p PCI, IPF w/ pulmonary HTN (PASP 110 on 7/5 echo), Crohn's, and MDD who presents as a transfer from [facility] ED for a syncopal episode due to hypotension, requiring pressor support with Levophed and midodrine.   History of present illness   54 yof with hypertension, HLD, HFpEF, CAD s/p BMS to RCA in 2004, IPF with pulm hypertension, Chrohn's disease, and depression who initially presented to [facility] ED on 02/28/20 following a syncopal event at home. Pt and her two daughters are the primary historian.  Patient recalls sitting on toilet for approximately 30 minutes prior to syncopal episode. She reports feeling hot/flushed. States she felt as though her oxygen had been turned off (requires 5L at home) although it was working appropriately. Denies CP and SOB immediately prior to episode and states that she cannot remember anything further.   Patient's daughter states that she became stiff on toilet with brief jerking movements (few seconds) prior to becoming limp. Patient's daughter caught the patient before she fell. She did not hit her head after losing consciousness. Reports that the patient had blue lips and cold extremities.   Pt daughter called EMS. On arrival, EMS noted BP 56/27. Taken to [facility] ED where BP improved with Levophed. Transferred to [facility].   Past Medical History   Past Medical History:  Diagnosis Date  . Anxiety   . Carotid artery disease ([place])    27-25% RICA and LICA 3/66 - Dr. Oneida Alar  . Cataract   . COPD (chronic obstructive pulmonary disease) ([place])   . Coronary atherosclerosis of native coronary artery    BMS to RCA 2004  . Depression   . DVT (deep venous thrombosis) ([place])   . Essential hypertension,  benign   . Fracture Left Great Toe  . Hepatic steatosis   . History of melanoma   . History of oral aphthous ulcers   . History of Salmonella gastroenteritis   . Hx of adenomatous colonic polyps   . Mixed hyperlipidemia   . Myocardial infarction ([place])    IMI 10/04  . Osteoarthritis   . Osteoporosis   . Pancreatitis   . Rectal bleeding   . Regional enteritis of large intestine ([place])   . Sinus polyp   . TIA (transient ischemic attack)   . Tubular adenoma of colon 08/2012   Significant Hospital Events   7/4: Present to [facility] ED via EMS 7/5: Transferred to [facility]  Consults:  Cardiology Heart Failure  Procedures:  7/5: Flexiseal  Significant Diagnostic Tests:  [facility]: 7/4 CXR>> mild, diffuse, chronic appearing increased interstitial lung markings. No evidence of acute infiltrate.  7/4 head CT>>no acute findings 7/4 abd/pelvis CT>>small hiatal hernia, bilateral renal cysts--no acute findings.  [facility]: 7/5 echocardiogram>> LVEF 55%, moderate LVH, interventricular septum flattening; severe reduction in RV systolic function, severe RV dilation, est. RV systolic pressure 440.3; severely elevated PASP; mod-severe tricuspid regurg; mild-mod AV sclerosis; IVC dilated with <50% variability suggesting RAP of 7mHg  Micro Data:  7/5: MRSA negative 7/5: Respiratory culture pending, sputum gram stain pending  Antimicrobials:     Interim history/subjective:  Resting comfortably in bed this morning, good response to diuresis, in agreement with plan for RHC  tomorrow.  Objective   Blood pressure (!) 115/54, pulse 65, temperature 98 F (36.7 C), temperature source Oral, resp. rate (!) 21, weight 74.7 kg, SpO2 99 %.        Intake/Output Summary (Last 24 hours) at 02/29/2020 0835 Last data filed at 02/29/2020 0600 Gross per 24 hour  Intake 777.21 ml  Output 2100 ml  Net -1322.79 ml   Filed Weights   02/28/20 1234 02/29/20 0500  Weight: 73.5 kg 74.7  kg    Examination: General: Resting comforably, in NAD HENT: MMM, EOMI Lungs: Fine bibasilar crackles. Normal WOB on 5L [place]. Acyanotic. Cardiovascular: RRR. 1+ LE edema. Abdomen: soft, non-tender, NABS Extremities: Warm and well perfused. Moves all extremities equally. Neuro: Oriented to person, time, place, and situation.  Resolved Hospital Problem list   none  Assessment & Plan:  Shelley [name] is an 79 y.o. female who presents to [facility] ED after a syncopal episode at home. Found to be hypotensive and started on pressors at [facility]. She was transferred to [facility] for higher level of care.   Syncope, Hypotension 2/2 Cor Pulmonale Suspected to be due to cor pulmonale, acute on chronic. Patient with history of IPF and pulmonary HTN. Echo here is consistent with acute exacerbation of right-sided heart failure. PASP 110 mm Hg, increased from 73 mm Hg in April 2021. Echo also revealing of interventricular septal flattening, RV enlargement, and decreased RV systolic function. Patient does appear volume up on exam, supported by dilated IVC on echo with <50% respiratory variation. We will plan to diurese with goal of RHC to obtain more accurate pressures.  - RHC scheduled for 7/7, NPO at midnight  - continue Lasix 80 mg BID, pt net negative 1.3 L in past 24 hours - wean pressors as tolerated, Levophed currently at 63mg  - f/u blood/urine cultures  Macrocytic Anemia Hemoglobin stable this AM at 10.7, MCV 103.1. Patient endorses decreased appetite and 50 lb weight loss since February.  -f/u iron studies -daily CBC  Hypokalemia K 3.1 from 3.7. Received KCL tablet 20 mEQ this AM. -ctm, replete as needed  Hypomagnesia Mg 1.2.  -ctm, replete as needed  Tropinemia  Likely demand ischemia in setting of hypotension and underlying CAD. Last LHC in 2019 with 70% blockage in RCA however pt denies chest pain, dizziness at this time.    Gout: pt noting recurrent attacks despite being on  medical therapy with colchicine and allipurinol. Uric acid 8.1 on admission. Continue allopurinol and colchicine Bilateral heel wounds. Pt noting 2/2 gout. Continue dressing changes. Monitor for infection. Chron's disease: continue sulfalazine OSA: CPAP COPD. Last PFTs in 2019 showing moderate to severe obstructive disease.  CAD/carotid stenosis: continue xarelto Hypertension: hold home antihypertensives Chronic hypoxic respiratory failure due to COPD/IPF on 5L at baseline. Remains on baseline supplemental O2.   Best practice:  Diet: Regular, NPO at midnight Pain/Anxiety/Delirium protocol (if indicated): Acetaminophen DVT prophylaxis: therapeutically anticoagulated on xarelto GI prophylaxis: none Glucose control: none Mobility: Bedbound Code Status: DNR/DNI Family Communication: daughters at bedside Disposition: ICU  Labs   CBC: Recent Labs  Lab 02/28/20 1653 02/29/20 0240  WBC 9.7 10.0  NEUTROABS 7.6  --   HGB 10.9* 10.7*  HCT 34.3* 33.4*  MCV 104.3* 103.1*  PLT 286 2341   Basic Metabolic Panel: Recent Labs  Lab 02/28/20 1653 02/29/20 0240  NA 137 140  K 3.4* 3.1*  CL 105 106  CO2 19* 21*  GLUCOSE 88 94  BUN 19 18  CREATININE  1.15* 0.91  CALCIUM 8.1* 8.3*  MG  --  1.2*   GFR: Estimated Creatinine Clearance: 49.9 mL/min (by C-G formula based on SCr of 0.91 mg/dL). Recent Labs  Lab 02/28/20 1653 02/28/20 2057 02/29/20 0240  WBC 9.7  --  10.0  LATICACIDVEN 1.6 1.9  --     Liver Function Tests: Recent Labs  Lab 02/28/20 1653  AST 53*  ALT 27  ALKPHOS 64  BILITOT 0.8  PROT 5.3*  ALBUMIN 2.1*   No results for input(s): LIPASE, AMYLASE in the last 168 hours. No results for input(s): AMMONIA in the last 168 hours.  ABG    Component Value Date/Time   HCO3 22.5 10/30/2017 1013   TCO2 24 10/30/2017 1013   ACIDBASEDEF 4.0 (H) 10/30/2017 1013   O2SAT 71.0 10/30/2017 1013     Coagulation Profile: No results for input(s): INR, PROTIME in the last  168 hours.  Cardiac Enzymes: No results for input(s): CKTOTAL, CKMB, CKMBINDEX, TROPONINI in the last 168 hours.  HbA1C: No results found for: HGBA1C  CBG: Recent Labs  Lab 02/28/20 1613 02/28/20 2302  GLUCAP 57* 76    Review of Systems:   Negative unless otherwise stated in HPI.  Past Medical History  She,  has a past medical history of Anxiety, Carotid artery disease ([place]), Cataract, COPD (chronic obstructive pulmonary disease) ([place]), Coronary atherosclerosis of native coronary artery, Depression, DVT (deep venous thrombosis) ([place]), Essential hypertension, benign, Fracture (Left Great Toe), Hepatic steatosis, History of melanoma, History of oral aphthous ulcers, History of Salmonella gastroenteritis, adenomatous colonic polyps, Mixed hyperlipidemia, Myocardial infarction ([place]), Osteoarthritis, Osteoporosis, Pancreatitis, Rectal bleeding, Regional enteritis of large intestine ([place]), Sinus polyp, TIA (transient ischemic attack), and Tubular adenoma of colon (08/2012).   Surgical History    Past Surgical History:  Procedure Laterality Date  . CATARACT EXTRACTION Bilateral   . Melanoma resection  1996  . RIGHT/LEFT HEART CATH AND CORONARY ANGIOGRAPHY N/A 10/30/2017   Procedure: RIGHT/LEFT HEART CATH AND CORONARY ANGIOGRAPHY;  Surgeon: Larey Dresser, MD;  Location: [place] [facility];  Service: Cardiovascular;  Laterality: N/A;  . TOTAL KNEE ARTHROPLASTY Right Feb 2012     Social History   reports that she quit smoking about 2 years ago. Her smoking use included cigarettes. She started smoking about 62 years ago. She has a 40.00 pack-year smoking history. She has never used smokeless tobacco. She reports that she does not drink alcohol and does not use drugs.   Family History   Her family history includes Breast cancer in her sister; Deep vein thrombosis in her brother; Diabetes type II in her mother; Heart attack (age of onset: 65) in her mother; Heart disease in her mother;  Hyperlipidemia in her sister; Hypertension in her sister; Other in her brother; Uterine cancer in her mother; Varicose Veins in her mother. There is no history of Colon cancer.   Allergies Allergies  Allergen Reactions  . Tape Other (See Comments)    SKIN HAS BECOME VERY THIN- TEARS EASILY!!  . Crestor [Rosuvastatin Calcium] Other (See Comments)    Muscle pain   . Doxycycline Other (See Comments)    Possible photosensitivity  . Mercaptopurine Other (See Comments)    Pancreatitis  . Simvastatin Other (See Comments)    Severe leg aches  . Penicillins Rash    Has patient had a PCN reaction causing immediate rash, facial/tongue/throat swelling, SOB or lightheadedness with hypotension: No Has patient had a PCN reaction causing severe rash involving mucus membranes  or skin necrosis: No Has patient had a PCN reaction that required hospitalization: No Has patient had a PCN reaction occurring within the last 10 years: No If all of the above answers are "NO", then may proceed with Cephalosporin use.   . Sulfa Antibiotics Rash     Home Medications  Prior to Admission medications   Medication Sig Start Date End Date Taking? Authorizing Provider  albuterol (PROAIR HFA) 108 (90 Base) MCG/ACT inhaler Inhale 2 puffs into the lungs every 4 (four) hours as needed for wheezing or shortness of breath. 06/14/19  Yes Parrett, Tammy S, NP  allopurinol (ZYLOPRIM) 100 MG tablet Take 100 mg by mouth every evening.   Yes [provider]  aspirin 81 MG tablet Take 81 mg by mouth daily.     Yes [provider]  colchicine 0.6 MG tablet Take 1 tablet (0.6 mg total) by mouth daily. Patient taking differently: Take 0.6 mg by mouth in the morning.  12/20/19  Yes Larey Dresser, MD  ferrous sulfate 325 (65 FE) MG tablet Take 325 mg by mouth daily.   Yes [provider]  FLUoxetine (PROZAC) 40 MG capsule Take 40 mg by mouth daily.   Yes [provider]  folic acid (FOLVITE) 1 MG  tablet TAKE 1 TABLET BY MOUTH EVERY DAY Patient taking differently: Take 1 mg by mouth in the morning.  02/11/19  Yes Nandigam, Venia Minks, MD  HYDROcodone-acetaminophen (NORCO) 10-325 MG tablet Take 1 tablet by mouth every 6 (six) hours as needed for moderate pain.    Yes [provider]  megestrol (MEGACE) 20 MG tablet Take 20 mg by mouth 2 (two) times daily.   Yes [provider]  metoprolol succinate (TOPROL-XL) 25 MG 24 hr tablet TAKE 1 TABLET BY MOUTH EVERY DAY Patient taking differently: Take 25 mg by mouth in the morning.  11/09/19  Yes Satira Sark, MD  mirtazapine (REMERON) 15 MG tablet Take 7.5 mg by mouth at bedtime.   Yes [provider]  nitroGLYCERIN (NITROSTAT) 0.4 MG SL tablet Place 1 tablet (0.4 mg total) under the tongue every 5 (five) minutes x 3 doses as needed. Patient taking differently: Place 0.4 mg under the tongue every 5 (five) minutes x 3 doses as needed for chest pain.  07/13/19  Yes Larey Dresser, MD  OFEV 100 MG CAPS TAKE 1 CAPSULE TWICE A DAY Patient taking differently: Take 100 mg by mouth in the morning and at bedtime.  12/27/19  Yes Mannam, Praveen, MD  Omega-3 Fatty Acids (FISH OIL) 1200 MG CAPS Take 1,200 mg by mouth 2 (two) times a week.    Yes [provider]  potassium chloride SA (KLOR-CON) 20 MEQ tablet TAKE 1 TABLET BY MOUTH TWICE DAILY Patient taking differently: Take 20 mEq by mouth 2 (two) times daily.  02/17/20  Yes Larey Dresser, MD  spironolactone (ALDACTONE) 25 MG tablet TAKE 1 TABLET BY MOUTH EVERY DAY Patient taking differently: Take 25 mg by mouth daily.  12/10/19  Yes Larey Dresser, MD  sulfaSALAzine (AZULFIDINE) 500 MG tablet Take 1 tablet (500 mg total) by mouth 2 (two) times daily. 03/13/17  Yes Nandigam, Venia Minks, MD  torsemide (DEMADEX) 20 MG tablet Take 4 tablets (80 mg total) by mouth daily. 12/20/19 03/19/20 Yes Larey Dresser, MD  XARELTO 2.5 MG TABS tablet TAKE 1 TABLET BY MOUTH TWICE  DAILY Patient taking differently: Take 2.5 mg by mouth 2 (two) times daily.  09/08/19  Yes Larey Dresser, MD  calcium citrate-vitamin D (CITRACAL+D) 315-200 MG-UNIT tablet Take 1 tablet by mouth daily.  Patient not taking: Reported on 02/28/2020    [provider]  Misc Natural Products (OSTEO BI-FLEX JOINT SHIELD PO) Take 1 capsule by mouth daily. Patient not taking: Reported on 02/28/2020    [provider]     Critical care time: [place], MS4

## 2020-02-29 NOTE — Consult Note (Signed)
WOC Nurse Consult Note: Patient receiving care in [facility] 2M02. Reason for Consult: sacral wound Wound type: healing stage 2 injuries to lower right and left sacral margins. Patient states these developed and were quite bad while she was being cared for in a skilled facility. Pressure Injury POA: Yes Measurement: approximately 1 cm x 1 cm x no measureable depth for both areas. Wound bed: both are pink Drainage (amount, consistency, odor) none from either Periwound: scarred, but healed Dressing procedure/placement/frequency:  Triple paste prn to sites. Remind patient to turn and not stay on her back. Monitor the wound area(s) for worsening of condition such as: Signs/symptoms of infection,  Increase in size,  Development of or worsening of odor, Development of pain, or increased pain at the affected locations.  Notify the medical team if any of these develop.  Thank you for the consult.  Discussed plan of care with the patient and bedside nurse.  [place] nurse will not follow at this time.  Please re-consult the [place] team if needed.  Val Riles, RN, MSN, CWOCN, CNS-BC, pager [phone]

## 2020-03-01 ENCOUNTER — Encounter (HOSPITAL_COMMUNITY): Payer: Self-pay | Admitting: Cardiology

## 2020-03-01 ENCOUNTER — Telehealth (HOSPITAL_COMMUNITY): Payer: Self-pay | Admitting: Pharmacist

## 2020-03-01 ENCOUNTER — Encounter (HOSPITAL_COMMUNITY)
Admission: AD | Disposition: A | Discharge status: Skilled Nursing Facility | Payer: Self-pay | Source: Other Acute Inpatient Hospital | Attending: Family Medicine

## 2020-03-01 DIAGNOSIS — I272 Pulmonary hypertension, unspecified: Secondary | ICD-10-CM

## 2020-03-01 HISTORY — PX: RIGHT HEART CATH: CATH118263

## 2020-03-01 LAB — POCT I-STAT EG7
Acid-Base Excess: 4 mmol/L — ABNORMAL HIGH (ref 0.0–2.0)
Acid-Base Excess: 6 mmol/L — ABNORMAL HIGH (ref 0.0–2.0)
Acid-Base Excess: 6 mmol/L — ABNORMAL HIGH (ref 0.0–2.0)
Bicarbonate: 27.7 mmol/L (ref 20.0–28.0)
Bicarbonate: 29.6 mmol/L — ABNORMAL HIGH (ref 20.0–28.0)
Bicarbonate: 29.6 mmol/L — ABNORMAL HIGH (ref 20.0–28.0)
Calcium, Ion: 0.91 mmol/L — ABNORMAL LOW (ref 1.15–1.40)
Calcium, Ion: 1.08 mmol/L — ABNORMAL LOW (ref 1.15–1.40)
Calcium, Ion: 1.1 mmol/L — ABNORMAL LOW (ref 1.15–1.40)
HCT: 29 % — ABNORMAL LOW (ref 36.0–46.0)
HCT: 31 % — ABNORMAL LOW (ref 36.0–46.0)
HCT: 31 % — ABNORMAL LOW (ref 36.0–46.0)
Hemoglobin: 10.5 g/dL — ABNORMAL LOW (ref 12.0–15.0)
Hemoglobin: 10.5 g/dL — ABNORMAL LOW (ref 12.0–15.0)
Hemoglobin: 9.9 g/dL — ABNORMAL LOW (ref 12.0–15.0)
O2 Saturation: 50 %
O2 Saturation: 54 %
O2 Saturation: 62 %
Potassium: 3.5 mmol/L (ref 3.5–5.1)
Potassium: 3.9 mmol/L (ref 3.5–5.1)
Potassium: 4 mmol/L (ref 3.5–5.1)
Sodium: 137 mmol/L (ref 135–145)
Sodium: 138 mmol/L (ref 135–145)
Sodium: 142 mmol/L (ref 135–145)
TCO2: 29 mmol/L (ref 22–32)
TCO2: 31 mmol/L (ref 22–32)
TCO2: 31 mmol/L (ref 22–32)
pCO2, Ven: 37.5 mmHg — ABNORMAL LOW (ref 44.0–60.0)
pCO2, Ven: 38.3 mmHg — ABNORMAL LOW (ref 44.0–60.0)
pCO2, Ven: 38.5 mmHg — ABNORMAL LOW (ref 44.0–60.0)
pH, Ven: 7.465 — ABNORMAL HIGH (ref 7.250–7.430)
pH, Ven: 7.496 — ABNORMAL HIGH (ref 7.250–7.430)
pH, Ven: 7.506 — ABNORMAL HIGH (ref 7.250–7.430)
pO2, Ven: 25 mmHg — CL (ref 32.0–45.0)
pO2, Ven: 26 mmHg — CL (ref 32.0–45.0)
pO2, Ven: 29 mmHg — CL (ref 32.0–45.0)

## 2020-03-01 LAB — CBC
HCT: 32.8 % — ABNORMAL LOW (ref 36.0–46.0)
Hemoglobin: 10.4 g/dL — ABNORMAL LOW (ref 12.0–15.0)
MCH: 32 pg (ref 26.0–34.0)
MCHC: 31.7 g/dL (ref 30.0–36.0)
MCV: 100.9 fL — ABNORMAL HIGH (ref 80.0–100.0)
Platelets: 226 10*3/uL (ref 150–400)
RBC: 3.25 MIL/uL — ABNORMAL LOW (ref 3.87–5.11)
RDW: 17 % — ABNORMAL HIGH (ref 11.5–15.5)
WBC: 10.7 10*3/uL — ABNORMAL HIGH (ref 4.0–10.5)
nRBC: 0 % (ref 0.0–0.2)

## 2020-03-01 LAB — BASIC METABOLIC PANEL
Anion gap: 12 (ref 5–15)
BUN: 15 mg/dL (ref 8–23)
CO2: 27 mmol/L (ref 22–32)
Calcium: 8.3 mg/dL — ABNORMAL LOW (ref 8.9–10.3)
Chloride: 97 mmol/L — ABNORMAL LOW (ref 98–111)
Creatinine, Ser: 0.86 mg/dL (ref 0.44–1.00)
GFR calc Af Amer: >60 mL/min (ref >60)
GFR calc non Af Amer: >60 mL/min (ref >60)
Glucose, Bld: 98 mg/dL (ref 70–99)
Potassium: 3.3 mmol/L — ABNORMAL LOW (ref 3.5–5.1)
Sodium: 136 mmol/L (ref 135–145)

## 2020-03-01 SURGERY — RIGHT HEART CATH
Anesthesia: LOCAL

## 2020-03-01 MED ORDER — LIDOCAINE HCL (PF) 1 % IJ SOLN
INTRAMUSCULAR | Status: DC | PRN
  Administered 2020-03-01: 2 mL

## 2020-03-01 MED ORDER — POTASSIUM CHLORIDE CRYS ER 20 MEQ PO TBCR: 40.0000 meq | EXTENDED_RELEASE_TABLET | Freq: Once | ORAL | Status: DC

## 2020-03-01 MED ORDER — HEPARIN (PORCINE) IN NACL 1000-0.9 UT/500ML-% IV SOLN
INTRAVENOUS | Status: DC | PRN
  Administered 2020-03-01: 500 mL

## 2020-03-01 MED ORDER — SODIUM CHLORIDE 0.9 % IV SOLN: INTRAVENOUS | Status: DC

## 2020-03-01 MED ORDER — SODIUM CHLORIDE 0.9% FLUSH: 3.0000 mL | INTRAVENOUS | Status: DC | PRN

## 2020-03-01 MED ORDER — POTASSIUM CHLORIDE CRYS ER 20 MEQ PO TBCR
20.0000 meq | EXTENDED_RELEASE_TABLET | ORAL | Status: AC
  Administered 2020-03-01 (×2): 20 meq via ORAL
  Filled 2020-03-01 (×2): qty 1

## 2020-03-01 MED ORDER — SODIUM CHLORIDE 0.9 % IV SOLN: 250.0000 mL | INTRAVENOUS | Status: DC | PRN

## 2020-03-01 MED ORDER — LIDOCAINE HCL (PF) 1 % IJ SOLN
INTRAMUSCULAR | Status: AC
  Filled 2020-03-01: qty 30

## 2020-03-01 MED ORDER — HEPARIN (PORCINE) IN NACL 1000-0.9 UT/500ML-% IV SOLN
INTRAVENOUS | Status: AC
  Filled 2020-03-01: qty 500

## 2020-03-01 MED ORDER — ADULT MULTIVITAMIN W/MINERALS CH
1.0000 | ORAL_TABLET | Freq: Every day | ORAL | Status: DC
  Administered 2020-03-01 – 2020-03-08 (×8): 1 via ORAL
  Filled 2020-03-01 (×8): qty 1

## 2020-03-01 MED ORDER — ASPIRIN 81 MG PO CHEW
81.0000 mg | CHEWABLE_TABLET | ORAL | Status: AC
  Administered 2020-03-01: 81 mg via ORAL
  Filled 2020-03-01: qty 1

## 2020-03-01 MED ORDER — TORSEMIDE 20 MG PO TABS
80.0000 mg | ORAL_TABLET | Freq: Every day | ORAL | Status: DC
  Administered 2020-03-02 – 2020-03-08 (×7): 80 mg via ORAL
  Filled 2020-03-01 (×8): qty 4

## 2020-03-01 MED ORDER — POTASSIUM CHLORIDE 10 MEQ/100ML IV SOLN
10.0000 meq | INTRAVENOUS | Status: AC
  Administered 2020-03-01 (×4): 10 meq via INTRAVENOUS
  Filled 2020-03-01 (×4): qty 100

## 2020-03-01 SURGICAL SUPPLY — 11 items
CATH BALLN WEDGE 5F 110CM (CATHETERS) ×1 IMPLANT
CATH SWAN DBL LUMAN 5F 110 (CATHETERS) ×1 IMPLANT
HOVERMATT SINGLE USE (MISCELLANEOUS) ×1 IMPLANT
PACK CARDIAC CATHETERIZATION (CUSTOM PROCEDURE TRAY) ×2 IMPLANT
PROTECTION STATION PRESSURIZED (MISCELLANEOUS) ×2
SHEATH GLIDE SLENDER 4/5FR (SHEATH) ×1 IMPLANT
STATION PROTECTION PRESSURIZED (MISCELLANEOUS) IMPLANT
TRANSDUCER W/STOPCOCK (MISCELLANEOUS) ×2 IMPLANT
TUBING ART PRESS 72  MALE/FEM (TUBING) ×2
TUBING ART PRESS 72 MALE/FEM (TUBING) IMPLANT
WIRE EMERALD 3MM-J .025X260CM (WIRE) ×1 IMPLANT

## 2020-03-01 NOTE — Telephone Encounter (Signed)
[facility] patient enrollment form faxed to [place], PharmD, BCPS, BCCP, CPP Heart Failure Clinic Pharmacist [phone]

## 2020-03-01 NOTE — Progress Notes (Signed)
Patient ID: Shelley [name], female   DOB: 10/16/1940, 79 y.o.   MRN: [id number]     Advanced Heart Failure Rounding Note  PCP-Cardiologist: Rozann Lesches, MD   Subjective:    Good diuresis again over the last day.  She is now off norepinephrine, remains on midodrine 7.5 mg tid.     Complains of gouty pain in feet bilaterally.    E coli noted in urine.   RHC Procedural Findings: Hemodynamics (mmHg) RA mean 4 RV 66/5 PA 63/27, mean 40 PCWP mean 6 Oxygen saturations: SVC 50% PA 54% AO 96% Cardiac Output (Fick) 3.71  Cardiac Index (Fick) 2.04 PVR 9.1 WU   Objective:   Weight Range: 73.7 kg Body mass index is 27.04 kg/m.   Vital Signs:   Temp:  [97.4 F (36.3 C)-98.5 F (36.9 C)] 98.3 F (36.8 C) (07/07 0707) Pulse Rate:  [60-155] 80 (07/07 0909) Resp:  [13-32] 13 (07/07 0909) BP: (81-120)/(37-90) 101/57 (07/07 0909) SpO2:  [87 %-100 %] 100 % (07/07 0909) Weight:  [73.7 kg] 73.7 kg (07/07 0347) Last BM Date: 02/28/20  Weight change: Filed Weights   02/28/20 1234 02/29/20 0500 03/01/20 0347  Weight: 73.5 kg 74.7 kg 73.7 kg    Intake/Output:   Intake/Output Summary (Last 24 hours) at 03/01/2020 0916 Last data filed at 03/01/2020 0800 Gross per 24 hour  Intake 1379.45 ml  Output 4540 ml  Net -3160.55 ml      Physical Exam    General: NAD Neck: No JVD, no thyromegaly or thyroid nodule.  Lungs: Dry crackles at bases. CV: Nondisplaced PMI.  Heart regular S1/S2, no S3/S4, no murmur.  No peripheral edema.   Abdomen: Soft, nontender, no hepatosplenomegaly, no distention.  Skin: Intact without lesions or rashes.  Neurologic: Alert and oriented x 3.  Psych: Normal affect. Extremities: No clubbing or cyanosis.  HEENT: Normal.    Telemetry   NSR 70s (personally reviewed)   Labs    CBC Recent Labs    02/28/20 1653 02/28/20 1653 02/29/20 0240 03/01/20 0321  WBC 9.7   < > 10.0 10.7*  NEUTROABS 7.6  --   --   --   HGB 10.9*   < > 10.7* 10.4*    HCT 34.3*   < > 33.4* 32.8*  MCV 104.3*   < > 103.1* 100.9*  PLT 286   < > 273 226   < > = values in this interval not displayed.   Basic Metabolic Panel Recent Labs    02/29/20 0240 02/29/20 0240 02/29/20 1537 02/29/20 1930 03/01/20 0321  NA 140   < > 136  --  136  K 3.1*   < > 4.0  --  3.3*  CL 106   < > 103  --  97*  CO2 21*   < > 24  --  27  GLUCOSE 94   < > 136*  --  98  BUN 18   < > 16  --  15  CREATININE 0.91   < > 0.98  --  0.86  CALCIUM 8.3*   < > 7.9*  --  8.3*  MG 1.2*  --   --  2.0  --    < > = values in this interval not displayed.   Liver Function Tests Recent Labs    02/28/20 1653  AST 53*  ALT 27  ALKPHOS 64  BILITOT 0.8  PROT 5.3*  ALBUMIN 2.1*   No results for input(s): LIPASE, AMYLASE in  the last 72 hours. Cardiac Enzymes No results for input(s): CKTOTAL, CKMB, CKMBINDEX, TROPONINI in the last 72 hours.  BNP: BNP (last 3 results) Recent Labs    02/28/20 1653  BNP 2,646.6*    ProBNP (last 3 results) No results for input(s): PROBNP in the last 8760 hours.   D-Dimer No results for input(s): DDIMER in the last 72 hours. Hemoglobin A1C No results for input(s): HGBA1C in the last 72 hours. Fasting Lipid Panel No results for input(s): CHOL, HDL, LDLCALC, TRIG, CHOLHDL, LDLDIRECT in the last 72 hours. Thyroid Function Tests No results for input(s): TSH, T4TOTAL, T3FREE, THYROIDAB in the last 72 hours.  Invalid input(s): FREET3  Other results:   Imaging    CARDIAC CATHETERIZATION  Result Date: 03/01/2020 1. Optimized filling pressures. 2. Low cardiac index at 2.0 3. Moderate pulmonary arterial hypertension with PVR 9.1 WU. Can stop IV Lasix.  Will plan to start Tyvaso, will likely need to be as outpatient.     Medications:     Scheduled Medications: . [MAR Hold] aspirin EC  81 mg Oral Daily  . [MAR Hold] Chlorhexidine Gluconate Cloth  6 each Topical Q0600  . [MAR Hold] colchicine  0.6 mg Oral Daily  . [MAR Hold] feeding  supplement (ENSURE ENLIVE)  237 mL Oral BID BM  . [MAR Hold] ferrous sulfate  325 mg Oral Daily  . [MAR Hold] FLUoxetine  40 mg Oral Daily  . [MAR Hold] folic acid  1 mg Oral Daily  . [MAR Hold] mouth rinse  15 mL Mouth Rinse BID  . [MAR Hold] midodrine  7.5 mg Oral TID WC  . [MAR Hold] Nintedanib  1 capsule Oral BID  . potassium chloride  20 mEq Oral Q4H  . [MAR Hold] rivaroxaban  2.5 mg Oral BID  . [MAR Hold] sodium chloride flush  3 mL Intravenous Q12H  . [MAR Hold] sulfaSALAzine  500 mg Oral BID  . [START ON 03/02/2020] torsemide  80 mg Oral Daily    Infusions: . sodium chloride    . sodium chloride 250 mL (03/01/20 0540)  . sodium chloride    . sodium chloride    . [MAR Hold] norepinephrine (LEVOPHED) Adult infusion Stopped (03/01/20 0225)    PRN Medications: sodium chloride, [MAR Hold] docusate sodium, Heparin (Porcine) in NaCl, [MAR Hold] HYDROcodone-acetaminophen, lidocaine (PF), [MAR Hold] polyethylene glycol, sodium chloride flush, [MAR Hold] Zinc Oxide   Assessment/Plan   1. Syncope: I am concerned that this could have been due to severe RV failure and pulmonary hypertension.  She has mildly hypotensive here, now weaned off norepinephrine to midodrine.  Echo shows severely dilated RV with severe RV dysfunction and PASP 110 mmHg. Volume status now improved. No significant arrhythmias noted so far.  - Will follow on telemetry, but suspect that event was less likely arrhythmic and more likely a vagal event while using the bathroom in setting of severe RV failure/pulmonary hypertension.  - Continue midodrine given RV failure.  2. Elevated Troponin: Noted at [facility].  She does have history of CAD (h/o BMS to RCA in 2004; LHC in 3/19 showed 70% in-stent restenosis in the RCA) but suspect this was demand ischemia in setting of RV failure/volume overload.  - Continue ASA 81.  - She has extensive vascular disease. Based on COMPASS study, I have her on rivaroxaban 2.5 mg bid.   -She has not been able to tolerate statins due to myalgias. She has an appointment soon inlipid clinic to get started with  Repatha.  3. RV failure with severe pulmonary hypertension: CTA chest in 1/19 showed chronic fibrotic changes in the lungs but not emphysema, IPF diagnosed by Dr. [place] Bells. However, PFTs in 3/19 were suggestive of moderate-severe obstruction consistent with COPD. RHC (3/19) showed moderate pulmonary arterial hypertension. V/Q scan showed no evidence for chronic pulmonary embolus. Echo this admission with EF 55%, moderate LVH, D-shaped septum, RV severe enlarged with severely decreased systolic function, PASP 818 mmHg, moderate-severe TR, IVC dilated suggesting RA pressure 15 mmHg.  She is thought to have group 3 PH due to COPD and interstitial lung disease (IPF), she has OSA as well.  We had been planning repeat RHC with eye toward starting Tyvaso based on INCREASE trial in IL[facility].   BNP was high and IVC was dilated on echo with IV septum left-shifted. She has been diuresed with good response.  RHC today with optimized filling pressures after diuresis and moderate pulmonary arterial hypertension with high PVR. - Can stop IV Lasix, transition to torsemide 80 mg daily tomorrow.  - I suspect she will need midodrine long-term with severe RV dysfunction, continue 7.5 mg tid.   - I would like to add Tyvaso to her regimen based on INCREASE trial, PCWP now optimized.  Suspect this will have to be done as outpatient but will check with pharmacy.  4. Pulmonary fibrosis: IPF. She is now on Ofev.  5. Carotid stenosis: Followed by [facility]. 6. PAD: Peripheral arterial dopplers (3/19) showed occluded left SFA.She has bilateral leg pain, but pain is not typical for claudication.  - Sees Dr. Oneida Alar, he has so far recommended medical management.  - She has quit smoking.  7. COPD: She has now quit smoking.  8. Gout: Severe gout recently, seems to be flaring now in setting of diuresis.  - Continue  colchicine, will need allopurinol eventually.  - May benefit from prednisone burst, per primary team.  9. Weight loss: Poor appetite/early satiety, ?due to RV failure at least in part.  - Ensure bid.  10. UTI: Has E coli in urine, will need antibiotic coverage per primary team.   Length of Stay: 2  Loralie Champagne, MD  03/01/2020, 9:16 AM  Advanced Heart Failure Team Pager [phone] (M-F; 7a - 4p)  Please contact [facility] for night-coverage after hours (4p -7a ) and weekends on [email or web address]

## 2020-03-01 NOTE — Progress Notes (Signed)
Spoke w/ pts dtr Shelley [name] to update that pt taken back for cath. Shelley [name] states understanding, appreciative.

## 2020-03-01 NOTE — Progress Notes (Signed)
[facility] ADULT ICU REPLACEMENT PROTOCOL   The patient does apply for the [facility] Adult ICU Electrolyte Replacment Protocol based on the criteria listed below:   1. Is GFR >/= 30 ml/min? Yes.    Patient's GFR today is >60 2. Is SCr </= 2? Yes.   Patient's SCr is 0.86 ml/kg/hr 3. Did SCr increase >/= 0.5 in 24 hours? No 4. Abnormal electrolyte(s): K+ 3.3 5. Ordered repletion with: Protocol 6. If a panic level lab has been reported, has the CCM MD in charge been notified? Yes.  .   Physician:  Randie Heinz 03/01/2020 4:05 AM

## 2020-03-01 NOTE — Telephone Encounter (Signed)
Received message that patient will be started on Tyvaso. I reached out to [facility] so they could work on contacting the patient. Will complete Tyvaso enrollment form and fax it in to [facility] once completed.   [name], PharmD, BCPS, BCCP, CPP Heart Failure Clinic Pharmacist [phone]

## 2020-03-01 NOTE — Progress Notes (Signed)
Initial Nutrition Assessment  DOCUMENTATION CODES:   Non-severe (moderate) malnutrition in context of chronic illness  INTERVENTION:    Ensure Enlive po BID, each supplement provides 350 kcal and 20 grams of protein.  MVI with minerals daily.  NUTRITION DIAGNOSIS:   Moderate Malnutrition related to chronic illness (CHF, COPD, gout) as evidenced by mild fat depletion, mild muscle depletion, percent weight loss (10% weight loss x 3 months).  GOAL:   Patient will meet greater than or equal to 90% of their needs  MONITOR:   PO intake, Supplement acceptance, Labs  REASON FOR ASSESSMENT:   Malnutrition Screening Tool    ASSESSMENT:   79 yo female admitted with heart failure. S/P R heart cath 7/7. PMH includes COPD, HLD, pancreatitis, hepatic steatosis, HTN, TIA, CAD, MI, osteoporosis, gout.   Patient reports that she has been eating poorly for the past 4 months due to decreased appetite associated with taking multiple medications and pain from gout. At home, she typically eats a small breakfast, no lunch, a snack, then a small dinner. She has had pain r/t gout for several months. She drinks Ensure at home sometimes.   Usual weights reviewed. 73.5 kg on admission, down from 82 kg 3 months ago. 10% weight loss within 3 months is significant for the time frame. Some weight loss is likely related to fluids, suspect actual weight loss will be even greater due to ongoing diuresis. Suspect most of her weight loss is related to poor appetite and decreased intake at home for the past 4 months.    Labs reviewed. K 3.3  CBG: 76-108  Medications reviewed and include colchicine, ferrous sulfate, folic acid, midodrine. Levophed has been stopped.   S/P cardiac cath this morning, no significant findings, no interventions required.   NUTRITION - FOCUSED PHYSICAL EXAM:    Most Recent Value  Orbital Region No depletion  Upper Arm Region Mild depletion  Thoracic and Lumbar Region No depletion   Buccal Region Mild depletion  Temple Region Mild depletion  Clavicle Bone Region Mild depletion  Clavicle and Acromion Bone Region Mild depletion  Scapular Bone Region Mild depletion  Dorsal Hand Mild depletion  Patellar Region Mild depletion  Anterior Thigh Region Mild depletion  Posterior Calf Region Mild depletion  Edema (RD Assessment) Unable to assess  Hair Reviewed  Eyes Reviewed  Mouth Reviewed  Skin Reviewed  Nails Reviewed       Diet Order:   Diet Order            Diet Heart Room service appropriate? Yes; Fluid consistency: Thin  Diet effective now                 EDUCATION NEEDS:   Not appropriate for education at this time  Skin:  Skin Assessment: Skin Integrity Issues: Skin Integrity Issues:: Stage I, Stage III Stage I: L and R heels Stage III: L and R buttocks  Last BM:  7/5  Height:   Ht Readings from Last 1 Encounters:  02/29/20 _0  (1.651 m)    Weight:   Wt Readings from Last 1 Encounters:  03/01/20 73.7 kg    Ideal Body Weight:  56.8 kg  BMI:  Body mass index is 27.04 kg/m.  Estimated Nutritional Needs:   Kcal:  1600-1800  Protein:  100-120 gm  Fluid:  1.6-1.8 L    Lucas Mallow, RD, LDN, CNSC Please refer to Amion for contact information.

## 2020-03-01 NOTE — Progress Notes (Addendum)
Messaged CCM MD re :pts SBP has been in mid to high 80s, MAP mid 50s to low 60s for past few hours. Levo has been off since 2AM. Minimal urine output this shift.  Per CCM MD, continue to monitor for now.

## 2020-03-01 NOTE — Progress Notes (Signed)
NAME:  Shelley [name], MRN:  [id number], DOB:  1940-09-05, LOS: 2 ADMISSION DATE:  02/28/2020, CONSULTATION DATE:  02/28/20 REFERRING MD:  Wallene Huh, CHIEF COMPLAINT:  hypotension   Brief History   9 yof with hypertension, HLD, HFpEF, CAD s/p stent placement, IPF with pulm hypertension, Chrohn's disease, and depression who was transferred from [facility] ED for syncope>hypotension requiring pressor support.   History of present illness   23 yof with hypertension, HLD, HFpEF, CAD s/p BMS to RCA in 2004, IPF with pulm hypertension, Chrohn's disease, and depression who initially presented to [facility] ED on 02/28/20 following a syncopal event at home. Pt and her two daughters are the primary historian Pt recalls having been sitting on the toliet for about 89mn prior to the episode. She denies any prodromal symptoms preceding the event. The next thing she remembered was waking up at the hospital. The daughters recall her sitting on the toilet and suddenly going stiff and having jerking movements for a few seconds prior to becoming limp. The daughter caught her from falling. She also notes that her lips turned blue and extremities cold.  When EMS arrived, they noted a blood pressure of 56/27. Upon arrival to the ED, levophed was started with improvement in pressures.  [facility]  Past Medical History  hypertension, HLD, HFpEF, CAD s/p BMS to RCA in 2004, carotid stenosis, COPD IPF with pulm hypertension, Crohn's disease, and depression   Significant Hospital Events   7/4 presents to [facility] ED [facility] Consults:  Cardiology HF  Procedures:  flexiseal 7/5>>  Significant Diagnostic Tests:  [facility]: 7/4 CXR>> mild, diffuse, chronic appearing increased interstitial lung markings. No evidence of acute infiltrate.  7/4 head CT>>no acute findings 7/4 abd/pelvis CT>>small hiatal hernia, bilateral renal cysts--no acute  findings.  [facility] echocardi[facility]>> LVEF 55%, moderate LVH, interventricular septum flattening; severe reduction in RV systolic function, severe RV dilation, est. RV systolic pressure 1588.3 severely elevated PASP; mod-severe tricuspid regurg; mild-mod AV sclerosis; IVC dilated with <50% variability suggesting RAP of 122mg  Micro Data:  7/5 blood culture>>NGTD 7/5 urine culture>>NGTD 7/5 Resp culture>>few gram positive cocci, rare gram neg rod  Antimicrobials:  n/a  Interim history/subjective:  Had difficulty sleeping overnight. Resting comfortably in bed this morning. Not feeling acutely short of breath, on 4L [place] with good SpO2. Reports pain in bilateral feet/ankles. She continues to respond well to diuresis.   Objective   Blood pressure (!) 100/42, pulse 78, temperature 98.3 F (36.8 C), temperature source Oral, resp. rate 19, height _0  (1.651 m), weight 73.7 kg, SpO2 100 %.        Intake/Output Summary (Last 24 hours) at 03/01/2020 0726 Last data filed at 03/01/2020 0600 Gross per 24 hour  Intake 1481.41 ml  Output 4550 ml  Net -3068.59 ml   Filed Weights   02/28/20 1234 02/29/20 0500 03/01/20 0347  Weight: 73.5 kg 74.7 kg 73.7 kg   Examination: General: well appearing female HENT: MMM Lungs: breathing comfortably on 4L [place]. Bibasilar fine crackles. Cardiovascular: RRR, no LE edema. Peripheries warm Abdomen: soft, nontender, bs active Extremities: moves all four extremities Neuro: a/o  Resolved Hospital Problem list   n/a  Assessment & Plan:   Syncope. Suspect 2/2 acute on chronic cor pulmonale Acute on Chronic right sided heart failure/Idiopathic Pulmonary Fibrosis with pulmonary hypertension (last PASP 73). Echo on this admission showing PASP of 11057m with interventricular septal  flattening and severe dilation and systolic dysfunction of RV. Although increased volume status suspected, she is still down about 10kg from March. Levo down to 3.75  mcg/min this AM Plan --RHC 2 pm today, currently NPO --F/u RHC findings for advanced heart failure planning --Continue Lasix 80 mg BID. She continues to respond well, net negative ~3L in past 24 hours. Creatinine is stable.  --Continue to wean levo as able--afraid diuresis may initially preclude this however will hopefully improve as LV volume does. MAP goal >65. Midodrine 7.5 mg TID added per AHF --continue Ofev for IPF --follow blood and urine cultures  Asymptomatic Bacteriuria Urine culture from 7/5 with growth of >100,000 colonies of e. Coli, susceptibilities to follow. Given absence of urinary frequency, dysuria, hematuria, suspect colonization instead of active infection.  -treatment not indicated   Acute macrocytic anemia.  Hemoglobin stable at 10.4 from 10.7. MCV 100.9, down from 103.1 Iron panel from yesterday significant for low TIBC (182), otherwise within normal limits. Given low TIBC and overall clinical picture, suspect anemia of chronic disease. No evidence of bleeding or hemolysis. Plan --continue to monitor with daily labs.  Hypokalemia. K 3.3 this morning, replete with 40 mEq KCl. Hypomagnesemia. 2.0 this morning.  Plan: continue to monitor and replete as needed  Tropinemia in the setting of CAD. Last LHC in 2019 with 70% blockage in RCA however pt denies chest pain, dizziness at this time. Suspect this is due to demand ischemia.   Gout: pt noting recurrent attacks despite being on medical therapy with colchicine and allipurinol. Uric acid 8.1 on admission. Continue allopurinol and colchicine Bilateral heel wounds. Pt noting 2/2 gout. Continue dressing changes. Monitor for infection. Chron's disease: continue sulfalazine OSA: CPAP COPD. Last PFTs in 2019 showing moderate to severe obstructive disease.  CAD/carotid stenosis: continue xarelto Hypertension: hold home antihypertensives Chronic hypoxic respiratory failure due to COPD/IPF on 5L at baseline. Remains on  baseline supplemental O2.  Best practice:  Diet: HH Pain/Anxiety/Delirium protocol (if indicated): tylenol VAP protocol (if indicated): n/a DVT prophylaxis: xarelto GI prophylaxis: n/a Glucose control: n/a Mobility: BR Code Status: DNR/DNI Family Communication: daughters updated at bedside Disposition: ICU  Labs   CBC: Recent Labs  Lab 02/28/20 1653 02/29/20 0240 03/01/20 0321  WBC 9.7 10.0 10.7*  NEUTROABS 7.6  --   --   HGB 10.9* 10.7* 10.4*  HCT 34.3* 33.4* 32.8*  MCV 104.3* 103.1* 100.9*  PLT 286 273 858    Basic Metabolic Panel: Recent Labs  Lab 02/28/20 1653 02/29/20 0240 02/29/20 1537 02/29/20 1930 03/01/20 0321  NA 137 140 136  --  136  K 3.4* 3.1* 4.0  --  3.3*  CL 105 106 103  --  97*  CO2 19* 21* 24  --  27  GLUCOSE 88 94 136*  --  98  BUN _0 --  15  CREATININE 1.15* 0.91 0.98  --  0.86  CALCIUM 8.1* 8.3* 7.9*  --  8.3*  MG  --  1.2*  --  2.0  --    GFR: Estimated Creatinine Clearance: 52.5 mL/min (by C-G formula based on SCr of 0.86 mg/dL). Recent Labs  Lab 02/28/20 1653 02/28/20 2057 02/29/20 0240 03/01/20 0321  WBC 9.7  --  10.0 10.7*  LATICACIDVEN 1.6 1.9  --   --     Liver Function Tests: Recent Labs  Lab 02/28/20 1653  AST 53*  ALT 27  ALKPHOS 64  BILITOT 0.8  PROT 5.3*  ALBUMIN 2.1*  No results for input(s): LIPASE, AMYLASE in the last 168 hours. No results for input(s): AMMONIA in the last 168 hours.  ABG    Component Value Date/Time   HCO3 22.5 10/30/2017 1013   TCO2 24 10/30/2017 1013   ACIDBASEDEF 4.0 (H) 10/30/2017 1013   O2SAT 71.0 10/30/2017 1013     Coagulation Profile: No results for input(s): INR, PROTIME in the last 168 hours.  Cardiac Enzymes: No results for input(s): CKTOTAL, CKMB, CKMBINDEX, TROPONINI in the last 168 hours.  HbA1C: No results found for: HGBA1C  CBG: Recent Labs  Lab 02/28/20 1613 02/28/20 2302 02/29/20 1149  GLUCAP 57* 76 108*    Review of Systems:   Negative  unless stated in HPI  Past Medical History  She,  has a past medical history of Anxiety, Carotid artery disease ([place]), Cataract, COPD (chronic obstructive pulmonary disease) ([place]), Coronary atherosclerosis of native coronary artery, Depression, DVT (deep venous thrombosis) ([place]), Essential hypertension, benign, Fracture (Left Great Toe), Hepatic steatosis, History of melanoma, History of oral aphthous ulcers, History of Salmonella gastroenteritis, adenomatous colonic polyps, Mixed hyperlipidemia, Myocardial infarction ([place]), Osteoarthritis, Osteoporosis, Pancreatitis, Rectal bleeding, Regional enteritis of large intestine ([place]), Sinus polyp, TIA (transient ischemic attack), and Tubular adenoma of colon (08/2012).   Surgical History    Past Surgical History:  Procedure Laterality Date   CATARACT EXTRACTION Bilateral    Melanoma resection  1996   RIGHT/LEFT HEART CATH AND CORONARY ANGIOGRAPHY N/A 10/30/2017   Procedure: RIGHT/LEFT HEART CATH AND CORONARY ANGIOGRAPHY;  Surgeon: Larey Dresser, MD;  Location: [facility];  Service: Cardiovascular;  Laterality: N/A;   TOTAL KNEE ARTHROPLASTY Right Feb 2012     Social History   reports that she quit smoking about 2 years ago. Her smoking use included cigarettes. She started smoking about 62 years ago. She has a 40.00 pack-year smoking history. She has never used smokeless tobacco. She reports that she does not drink alcohol and does not use drugs.   Family History   Her family history includes Breast cancer in her sister; Deep vein thrombosis in her brother; Diabetes type II in her mother; Heart attack (age of onset: 52) in her mother; Heart disease in her mother; Hyperlipidemia in her sister; Hypertension in her sister; Other in her brother; Uterine cancer in her mother; Varicose Veins in her mother. There is no history of Colon cancer.   Allergies Allergies  Allergen Reactions   Tape Other (See Comments)    SKIN HAS BECOME VERY THIN-  TEARS EASILY!!   Crestor [Rosuvastatin Calcium] Other (See Comments)    Muscle pain    Doxycycline Other (See Comments)    Possible photosensitivity   Mercaptopurine Other (See Comments)    Pancreatitis   Simvastatin Other (See Comments)    Severe leg aches   Penicillins Rash    Has patient had a PCN reaction causing immediate rash, facial/tongue/throat swelling, SOB or lightheadedness with hypotension: No Has patient had a PCN reaction causing severe rash involving mucus membranes or skin necrosis: No Has patient had a PCN reaction that required hospitalization: No Has patient had a PCN reaction occurring within the last 10 years: No If all of the above answers are "NO", then may proceed with Cephalosporin use.    Sulfa Antibiotics Rash     Home Medications  Prior to Admission medications   Medication Sig Start Date End Date Taking? Authorizing Provider  albuterol (PROAIR HFA) 108 (90 Base) MCG/ACT inhaler Inhale 2 puffs into the  lungs every 4 (four) hours as needed for wheezing or shortness of breath. 06/14/19   Parrett, Fonnie Mu, NP  aspirin 81 MG tablet Take 81 mg by mouth daily.      [provider]  calcium citrate-vitamin D (CITRACAL+D) 315-200 MG-UNIT tablet Take 1 tablet by mouth daily.     [provider]  colchicine 0.6 MG tablet Take 1 tablet (0.6 mg total) by mouth daily. 12/20/19   Larey Dresser, MD  ferrous sulfate 325 (65 FE) MG tablet Take 325 mg by mouth daily.    [provider]  FLUoxetine (PROZAC) 40 MG capsule Take 40 mg by mouth daily.    [provider]  folic acid (FOLVITE) 1 MG tablet TAKE 1 TABLET BY MOUTH EVERY DAY 02/11/19   Mauri Pole, MD  HYDROcodone-acetaminophen (NORCO) 10-325 MG tablet Take 1 tablet by mouth every 6 (six) hours as needed for moderate pain.     [provider]  metoprolol succinate (TOPROL-XL) 25 MG 24 hr tablet TAKE 1 TABLET BY MOUTH EVERY DAY 11/09/19   Satira Sark, MD    Misc Natural Products (OSTEO BI-FLEX JOINT SHIELD PO) Take 1 capsule by mouth daily.    [provider]  nitroGLYCERIN (NITROSTAT) 0.4 MG SL tablet Place 1 tablet (0.4 mg total) under the tongue every 5 (five) minutes x 3 doses as needed. 07/13/19   Larey Dresser, MD  OFEV 100 MG CAPS TAKE 1 CAPSULE TWICE A DAY 12/27/19   Mannam, Hart Robinsons, MD  Omega-3 Fatty Acids (FISH OIL) 1200 MG CAPS Take 1,200 mg by mouth daily.     [provider]  potassium chloride SA (KLOR-CON) 20 MEQ tablet TAKE 1 TABLET BY MOUTH TWICE DAILY 02/17/20   Larey Dresser, MD  predniSONE (DELTASONE) 10 MG tablet Take 19m (4 tabs) for 2 days, then 373m(3 tabs) for 2 days, then 2022m2 tabs) for 2 days then 38m79m tab) for 2 days 12/20/19   McLeLarey Dresser  spironolactone (ALDACTONE) 25 MG tablet TAKE 1 TABLET BY MOUTH EVERY DAY 12/10/19   McLeLarey Dresser  sulfaSALAzine (AZULFIDINE) 500 MG tablet Take 1 tablet (500 mg total) by mouth 2 (two) times daily. 03/13/17   NandMauri Pole  torsemide (DEMADEX) 20 MG tablet Take 4 tablets (80 mg total) by mouth daily. 12/20/19 03/19/20  McLeLarey Dresser  XARELTO 2.5 MG TABS tablet TAKE 1 TABLET BY MOUTH TWICE DAILY 09/08/19   McLeLarey Dresser     ClayRiccardo Dubin4

## 2020-03-01 NOTE — Interval H&P Note (Signed)
History and Physical Interval Note:  03/01/2020 8:43 AM  Shelley [name]  has presented today for surgery, with the diagnosis of heart failure.  The various methods of treatment have been discussed with the patient and family. After consideration of risks, benefits and other options for treatment, the patient has consented to  Procedure(s): RIGHT HEART CATH (N/A) as a surgical intervention.  The patient's history has been reviewed, patient examined, no change in status, stable for surgery.  I have reviewed the patient's chart and labs.  Questions were answered to the patient's satisfaction.     Honor Fairbank [employer]

## 2020-03-02 DIAGNOSIS — I471 Supraventricular tachycardia: Secondary | ICD-10-CM

## 2020-03-02 LAB — CULTURE, RESPIRATORY W GRAM STAIN: Culture: NORMAL

## 2020-03-02 LAB — BASIC METABOLIC PANEL
Anion gap: 9 (ref 5–15)
BUN: 12 mg/dL (ref 8–23)
CO2: 25 mmol/L (ref 22–32)
Calcium: 8.6 mg/dL — ABNORMAL LOW (ref 8.9–10.3)
Chloride: 102 mmol/L (ref 98–111)
Creatinine, Ser: 0.78 mg/dL (ref 0.44–1.00)
GFR calc Af Amer: >60 mL/min (ref >60)
GFR calc non Af Amer: >60 mL/min (ref >60)
Glucose, Bld: 94 mg/dL (ref 70–99)
Potassium: 4.1 mmol/L (ref 3.5–5.1)
Sodium: 136 mmol/L (ref 135–145)

## 2020-03-02 LAB — CBC
HCT: 31.6 % — ABNORMAL LOW (ref 36.0–46.0)
Hemoglobin: 10.3 g/dL — ABNORMAL LOW (ref 12.0–15.0)
MCH: 33.4 pg (ref 26.0–34.0)
MCHC: 32.6 g/dL (ref 30.0–36.0)
MCV: 102.6 fL — ABNORMAL HIGH (ref 80.0–100.0)
Platelets: 221 10*3/uL (ref 150–400)
RBC: 3.08 MIL/uL — ABNORMAL LOW (ref 3.87–5.11)
RDW: 17.4 % — ABNORMAL HIGH (ref 11.5–15.5)
WBC: 11.6 10*3/uL — ABNORMAL HIGH (ref 4.0–10.5)
nRBC: 0 % (ref 0.0–0.2)

## 2020-03-02 LAB — URINE CULTURE: Culture: >=100000 — AB

## 2020-03-02 MED ORDER — MIRTAZAPINE 15 MG PO TABS
7.5000 mg | ORAL_TABLET | Freq: Every day | ORAL | Status: DC
  Administered 2020-03-02 – 2020-03-07 (×6): 7.5 mg via ORAL
  Filled 2020-03-02 (×6): qty 1

## 2020-03-02 MED ORDER — METOPROLOL SUCCINATE ER 25 MG PO TB24
25.0000 mg | ORAL_TABLET | Freq: Every day | ORAL | Status: DC
  Administered 2020-03-02 – 2020-03-08 (×6): 25 mg via ORAL
  Filled 2020-03-02 (×7): qty 1

## 2020-03-02 MED ORDER — PREDNISONE 20 MG PO TABS
40.0000 mg | ORAL_TABLET | Freq: Every day | ORAL | Status: AC
  Administered 2020-03-02 – 2020-03-04 (×3): 40 mg via ORAL
  Filled 2020-03-02 (×2): qty 2
  Filled 2020-03-02: qty 4

## 2020-03-02 MED ORDER — ADENOSINE 6 MG/2ML IV SOLN
INTRAVENOUS | Status: AC
  Filled 2020-03-02: qty 2

## 2020-03-02 MED ORDER — METOPROLOL TARTRATE 5 MG/5ML IV SOLN
INTRAVENOUS | Status: AC
  Administered 2020-03-02: 5 mg via INTRAVENOUS
  Filled 2020-03-02: qty 5

## 2020-03-02 MED ORDER — METOPROLOL TARTRATE 5 MG/5ML IV SOLN: 5.0000 mg | Freq: Once | INTRAVENOUS | Status: AC

## 2020-03-02 NOTE — Progress Notes (Signed)
PROGRESS NOTE    Shelley [name]  OIB:[id number] DOB: 06-29-1941 DOA: 02/28/2020 PCP: Shelley Rio, MD      Brief Narrative:  Shelley [name] is a 79 y.o. F with severe pHTN, IPF on Ofev and COPD/emphysema on home O2 5-6L, Crohn's disease, hx CVA no residual deficits, hx DVT, HTN, and CAD who presented from [facility] ER after a syncopal episode.    Patient had been sitting on the toilet at home, passed out.  Daughters called EMS.  Initially emergency services noted BP 56/27.  In the ER at [facility], she was still hypotensive, started on Levophed and transferred to [facility].        Assessment & Plan:  Syncope due to pHTN and RV failure  Severe pulmonary hypertension Acute on chronic right heart failure Cardiology consulted.  Patient started on diuretics and hemodynamics improved.    Underwent echo and then [place] on 7/7 that showed improved right sided pressures.  BP soft but asymptomatic and mentation good  -Consult advanced HF team, appreciate cares -Continue midodrine -Continue torsemide    Coronary artery disease secondary prevention Demand ischemia -Continue aspirin -Continue Xarelto for CV disease  COPD with chronic hypoxic respiratory failure Idiopathic pulmonary fibrosis -Continue Ofev  Hypertension Peripheral vascular disease History of stroke  Crohn's disease No active diseae -Continue sulfazalazine -Continue folate  SVT This is a new finding 7/8, asymptomatic, hemodynamically stable.  Cardiology have started metoprolol -Continue metoprolol  Asymptomatic bacteriuria No treatment warranted  Possible gout flare She has new onset of tenderness in both feet 7/8, which she feels is characteristic of her gout flares.    Her pain is actually in the mid-foot mostly, and there is minimal appreciable redness, however, the character and onset are typical for gout, so I feel treatment with prednisone is not unreasonable.  -Prednisone 40 mg once adily  for 3 days.   -Continue daily colchicine -Hold allopurinol while flaring  Anemia of chronic disease Hgb stable -Continue iron  Pressure injury right and left buttock, stage III, POA Pressure injury right and left heel, stage I, POA  Mood -Continue fluoxetine   Failure to thrive Moderate protein calorie malnutrition As evidenced by moderate loss of subcutaneous muscle mass of back, chronic illness, poor p.o. intake. -Hold Megace -Resume mirtazapine           Disposition: Status is: Inpatient  Remains inpatient appropriate because:Unsafe d/c plan   Dispo: The patient is from: Home              Anticipated d/c is to: SNF              Anticipated d/c date is: 2 days              Patient currently is not medically stable to d/c.    The patietn has been aggresively diuresed and Cardiology are adjusting cardioactive medicines.  We will continue adjustment of metoprolol, diuretics, and if stable and can find safe disposition to SNF, likely to SNF tomorrow.          MDM: The below labs and imaging reports were reviewed and summarized above.  Medication management as above.    DVT prophylaxis: SCDs Start: 02/28/20 1401 rivaroxaban (XARELTO) tablet 2.5 mg  Code Status: DNR Family Communication:     Consultants:   Cardiology  Critical care  Procedures:   7/4 CT head at [facility]  7/4 CT abdomen and pelvis at [facility] acute abnormalities  7/5 echocardiogram EF 55%, severe  RV dysfunction, dilation, elevated right-sided pressures estimated  7/7 right heart cath-cardiac index low normal, filling pressures optimized        Antimicrobials:      Culture data:   Blood culture 7/5 no growth  Urine culture 7/5 pansensitive E. coli          Subjective: Patient talking on the phone with her insurance representative when I come in the room, she reports severe bilateral foot pain, midfoot, without redness, worse with  light touching.  She has had no fever, cough, dysuria, urinary frequency.  No abdominal pain.  No confusion.  Her breathing is still not back to baseline she is very out of breath with minimal exertion.  Objective: Vitals:   03/02/20 1107 03/02/20 1200 03/02/20 1300 03/02/20 1400  BP:  (!) 95/58 (!) 87/52 (!) 85/52  Pulse:  92 74 80  Resp:  17 20 (!) 25  Temp: (!) 97.4 F (36.3 C)     TempSrc: Oral     SpO2:  94% 96% 97%  Weight:      Height:        Intake/Output Summary (Last 24 hours) at 03/02/2020 1518 Last data filed at 03/02/2020 1300 Gross per 24 hour  Intake 1397 ml  Output 920 ml  Net 477 ml   Filed Weights   02/28/20 1234 02/29/20 0500 03/01/20 0347  Weight: 73.5 kg 74.7 kg 73.7 kg    Examination: General appearance: Overweight elderly adult female, alert and in no acute distress.  Talking on the phone HEENT: Anicteric, conjunctiva pink, lids and lashes normal. No nasal deformity, discharge, epistaxis.  Lips moist.   Skin: Warm and dry.  No jaundice.  No suspicious rashes or lesions. Cardiac: RRR, nl S1-S2, no gallop noted.  Capillary refill is brisk.  JVP not visible.  Minimal pitting LE edema.  Radial pulses 2+ and symmetric. Respiratory: Normal respiratory rate and rhythm, very dyspneic with exertion.  CTAB without rales or wheezes. Abdomen: Abdomen soft.  no TTP or guarding. No ascites, distension, hepatosplenomegaly.   MSK: No deformities or effusions.  Moderate loss of subcutaneous muscle mass and fat Neuro: Awake and alert.  EOMI, moves all extremities. Speech fluent.    Psych: Sensorium intact and responding to questions, attention normal. Affect normal.  Judgment and insight appear normal.    Data Reviewed: I have personally reviewed following labs and imaging studies:  CBC: Recent Labs  Lab 02/28/20 1653 02/28/20 1653 02/29/20 0240 02/29/20 0240 03/01/20 0321 03/01/20 0905 03/01/20 0906 03/01/20 0907 03/02/20 0246  WBC 9.7  --  10.0  --  10.7*  --    --   --  11.6*  NEUTROABS 7.6  --   --   --   --   --   --   --   --   HGB 10.9*   < > 10.7*   < > 10.4* 10.5* 10.5* 9.9* 10.3*  HCT 34.3*   < > 33.4*   < > 32.8* 31.0* 31.0* 29.0* 31.6*  MCV 104.3*  --  103.1*  --  100.9*  --   --   --  102.6*  PLT 286  --  273  --  226  --   --   --  221   < > = values in this interval not displayed.   Basic Metabolic Panel: Recent Labs  Lab 02/28/20 1653 02/28/20 1653 02/29/20 0240 02/29/20 0240 02/29/20 1537 02/29/20 1537 02/29/20 1930 03/01/20 0321 03/01/20 1749 03/01/20 4496  03/01/20 0907 03/02/20 0246  NA 137   < > 140   < > 136   < >  --  136 138 137 142 136  K 3.4*   < > 3.1*   < > 4.0   < >  --  3.3* 3.9 4.0 3.5 4.1  CL 105  --  106  --  103  --   --  97*  --   --   --  102  CO2 19*  --  21*  --  24  --   --  27  --   --   --  25  GLUCOSE 88  --  94  --  136*  --   --  98  --   --   --  94  BUN 19  --  18  --  16  --   --  15  --   --   --  12  CREATININE 1.15*  --  0.91  --  0.98  --   --  0.86  --   --   --  0.78  CALCIUM 8.1*  --  8.3*  --  7.9*  --   --  8.3*  --   --   --  8.6*  MG  --   --  1.2*  --   --   --  2.0  --   --   --   --   --    < > = values in this interval not displayed.   GFR: Estimated Creatinine Clearance: 56.4 mL/min (by C-G formula based on SCr of 0.78 mg/dL). Liver Function Tests: Recent Labs  Lab 02/28/20 1653  AST 53*  ALT 27  ALKPHOS 64  BILITOT 0.8  PROT 5.3*  ALBUMIN 2.1*   No results for input(s): LIPASE, AMYLASE in the last 168 hours. No results for input(s): AMMONIA in the last 168 hours. Coagulation Profile: No results for input(s): INR, PROTIME in the last 168 hours. Cardiac Enzymes: No results for input(s): CKTOTAL, CKMB, CKMBINDEX, TROPONINI in the last 168 hours. BNP (last 3 results) No results for input(s): PROBNP in the last 8760 hours. HbA1C: No results for input(s): HGBA1C in the last 72 hours. CBG: Recent Labs  Lab 02/28/20 1613 02/28/20 2302 02/29/20 1149  GLUCAP  57* 76 108*   Lipid Profile: No results for input(s): CHOL, HDL, LDLCALC, TRIG, CHOLHDL, LDLDIRECT in the last 72 hours. Thyroid Function Tests: No results for input(s): TSH, T4TOTAL, FREET4, T3FREE, THYROIDAB in the last 72 hours. Anemia Panel: Recent Labs    02/29/20 0900  TIBC 182*  IRON 49   Urine analysis:    Component Value Date/Time   COLORURINE AMBER BIOCHEMICALS MAY BE AFFECTED BY COLOR (A) 10/09/2010 1125   APPEARANCEUR CLEAR 10/09/2010 1125   LABSPEC 1.028 10/09/2010 1125   PHURINE 6.5 10/09/2010 1125   HGBUR NEGATIVE 10/09/2010 1125   BILIRUBINUR NEGATIVE 10/09/2010 1125   KETONESUR NEGATIVE 10/09/2010 1125   PROTEINUR NEGATIVE 10/09/2010 1125   UROBILINOGEN 0.2 10/09/2010 1125   NITRITE NEGATIVE 10/09/2010 1125   LEUKOCYTESUR NEGATIVE 10/09/2010 1125   Sepsis Labs: _0 (procalcitonin:4,lacticacidven:4)  ) Recent Results (from the past 240 hour(s))  MRSA PCR Screening     Status: None   Collection Time: 02/28/20 12:49 PM   Specimen: Nasal Mucosa; Nasopharyngeal  Result Value Ref Range Status   MRSA by PCR NEGATIVE NEGATIVE Final    Comment:  The GeneXpert MRSA Assay (FDA approved for NASAL specimens only), is one component of a comprehensive MRSA colonization surveillance program. It is not intended to diagnose MRSA infection nor to guide or monitor treatment for MRSA infections. Performed at [facility], [place] [address] [place], [place] [address]   Culture, blood (routine x 2)     Status: None (Preliminary result)   Collection Time: 02/28/20  4:54 PM   Specimen: BLOOD  Result Value Ref Range Status   Specimen Description BLOOD RIGHT ANTECUBITAL  Final   Special Requests   Final    BOTTLES DRAWN AEROBIC AND ANAEROBIC Blood Culture adequate volume   Culture   Final    NO GROWTH 3 DAYS Performed at [facility], [place] [address] [place], [place] [address]    Report Status PENDING  Incomplete  Culture, blood (routine x 2)      Status: None (Preliminary result)   Collection Time: 02/28/20  5:00 PM   Specimen: BLOOD RIGHT HAND  Result Value Ref Range Status   Specimen Description BLOOD RIGHT HAND  Final   Special Requests   Final    BOTTLES DRAWN AEROBIC ONLY Blood Culture results may not be optimal due to an inadequate volume of blood received in culture bottles   Culture   Final    NO GROWTH 3 DAYS Performed at [facility], [place] [address], [place], [place] [address]    Report Status PENDING  Incomplete  Urine culture     Status: Abnormal   Collection Time: 02/28/20  6:56 PM   Specimen: Urine, Random  Result Value Ref Range Status   Specimen Description URINE, RANDOM  Final   Special Requests   Final    NONE Performed at [facility], [place] [address] [place], [place] [address]    Culture >=100,000 COLONIES/mL ESCHERICHIA COLI (A)  Final   Report Status 03/02/2020 FINAL  Final   Organism ID, Bacteria ESCHERICHIA COLI (A)  Final      Susceptibility   Escherichia coli - MIC*    AMPICILLIN <=2 SENSITIVE Sensitive     CEFAZOLIN <=4 SENSITIVE Sensitive     CEFTRIAXONE <=0.25 SENSITIVE Sensitive     CIPROFLOXACIN <=0.25 SENSITIVE Sensitive     GENTAMICIN <=1 SENSITIVE Sensitive     IMIPENEM <=0.25 SENSITIVE Sensitive     NITROFURANTOIN <=16 SENSITIVE Sensitive     TRIMETH/SULFA <=20 SENSITIVE Sensitive     AMPICILLIN/SULBACTAM <=2 SENSITIVE Sensitive     PIP/TAZO <=4 SENSITIVE Sensitive     * >=100,000 COLONIES/mL ESCHERICHIA COLI  Culture, respiratory (tracheal aspirate)     Status: None   Collection Time: 02/28/20  6:56 PM   Specimen: Sputum; Respiratory  Result Value Ref Range Status   Specimen Description SPU  Final   Special Requests NONE  Final   Gram Stain   Final    FEW WBC PRESENT, PREDOMINANTLY PMN FEW GRAM POSITIVE COCCI RARE GRAM VARIABLE ROD    Culture   Final    RARE Consistent with normal respiratory flora. Performed at [facility], [place] [address]  [place],  [address]    Report Status 03/02/2020 FINAL  Final         Radiology Studies: CARDIAC CATHETERIZATION  Result Date: 03/01/2020 1. Optimized filling pressures. 2. Low cardiac index at 2.0 3. Moderate pulmonary arterial hypertension with PVR 9.1 WU. Can stop IV Lasix.  Will plan to start Tyvaso, will likely need to be  as outpatient.        Scheduled Meds: . adenosine      . aspirin EC  81 mg Oral Daily  . Chlorhexidine Gluconate Cloth  6 each Topical Q0600  . colchicine  0.6 mg Oral Daily  . feeding supplement (ENSURE ENLIVE)  237 mL Oral BID BM  . ferrous sulfate  325 mg Oral Daily  . FLUoxetine  40 mg Oral Daily  . folic acid  1 mg Oral Daily  . mouth rinse  15 mL Mouth Rinse BID  . metoprolol succinate  25 mg Oral Daily  . midodrine  7.5 mg Oral TID WC  . multivitamin with minerals  1 tablet Oral Daily  . Nintedanib  1 capsule Oral BID  . predniSONE  40 mg Oral Q breakfast  . rivaroxaban  2.5 mg Oral BID  . sodium chloride flush  3 mL Intravenous Q12H  . sulfaSALAzine  500 mg Oral BID  . torsemide  80 mg Oral Daily   Continuous Infusions: . sodium chloride 10 mL/hr at 03/01/20 2050  . sodium chloride 250 mL (03/01/20 0540)     LOS: 3 days    Time spent: 35 minutes    Edwin Dada, MD [facility] 03/02/2020, 3:18 PM     Please page though [place] or Epic secure chat:  For [employer], Adult nurse

## 2020-03-02 NOTE — Progress Notes (Signed)
Report given to Mickel Baas, receiving RN on 26M. Pt to be transported by bed.  Spoke w/ pts dtr Claiborne Billings to relay new room number.  [employer].

## 2020-03-02 NOTE — Progress Notes (Signed)
PT Cancellation Note  Patient Details Name: Shelley [name] MRN: [id number] DOB: February 07, 1941   Cancelled Treatment:    Reason Eval/Treat Not Completed: Patient declined, no reason specified.  OT evaluation limited.  When OT stated, PT to be here soon, pt stated not today.  Will see pt for evaluation 7/9 as able. 03/02/2020  Ginger Carne., PT Acute Rehabilitation Services [phone]  (pager) [phone]  (office)   Tessie Fass Annika Selke 03/02/2020, 5:48 PM

## 2020-03-02 NOTE — Progress Notes (Signed)
Pt has had multiple brief episodes of non-sustained svt this AM.  ~1245, pt went into sustained SVT (mid 150s), cards NP at bedside.   59m lopressor given and pt converted back to NSR. Dr. MAundra Dubinnow on unit, adding daily metoprolol.  Last BP 87/52 w/ MAP of 61.  Will continue to monitor closely and update CCM MD.

## 2020-03-02 NOTE — Progress Notes (Signed)
NAME:  Shelley [name], MRN:  [id number], DOB:  10-11-1940, LOS: 3 ADMISSION DATE:  02/28/2020, CONSULTATION DATE:  02/28/20 REFERRING MD:  Wallene Huh, CHIEF COMPLAINT:  hypotension   Brief History   44 yof with hypertension, HLD, HFpEF, CAD s/p stent placement, IPF with pulm hypertension, Chrohn's disease, and depression who was transferred from [facility] ED for syncope>hypotension requiring pressor support.   History of present illness   22 yof with hypertension, HLD, HFpEF, CAD s/p BMS to RCA in 2004, IPF with pulm hypertension, Chrohn's disease, and depression who initially presented to [facility] ED on 02/28/20 following a syncopal event at home. Pt and her two daughters are the primary historian Pt recalls having been sitting on the toliet for about 78mn prior to the episode. She denies any prodromal symptoms preceding the event. The next thing she remembered was waking up at the hospital. The daughters recall her sitting on the toilet and suddenly going stiff and having jerking movements for a few seconds prior to becoming limp. The daughter caught her from falling. She also notes that her lips turned blue and extremities cold.  When EMS arrived, they noted a blood pressure of 56/27. Upon arrival to the ED, levophed was started with improvement in pressures.  [facility]  Past Medical History  hypertension, HLD, HFpEF, CAD s/p BMS to RCA in 2004, carotid stenosis, COPD IPF with pulm hypertension, Crohn's disease, and depression   Significant Hospital Events   7/4 [facility] to [place] [facility] Consults:  Cardiology HF  Procedures:  flexiseal 7/5>>  Significant Diagnostic Tests:  [place] [facility]: 7/4 CXR>> mild, diffuse, chronic appearing increased interstitial lung markings. No evidence of acute infiltrate.  7/4 head CT>>no acute findings 7/4 abd/pelvis CT>>small hiatal hernia, bilateral renal cysts--no acute  findings.  [facility] echocardiogram>> LVEF 55%, moderate LVH, interventricular septum flattening; severe reduction in RV systolic function, severe RV dilation, est. RV systolic pressure 1681.1 severely elevated PASP; mod-severe tricuspid regurg; mild-mod AV sclerosis; IVC dilated with <50% variability suggesting RAP of 125mg  RHC 7/7: Moderate PAH (PVR 9.1 WU), cardiac index low (2.0), filling pressures optimized  Micro Data:  7/5 blood culture>>NGTD 7/5 urine culture>>NGTD 7/5 Resp culture>>few gram positive cocci, rare gram neg rod  Antimicrobials:  n/a  Interim history/subjective:  Patient feeling ok this morning. She had some low blood pressures overnight to the  Low BPs overnight to the [id number] but continues to mentate normally.  Lasix stopped yesterday. UOP only 560 mL in past 24 hours. Cr stable. Objective   Blood pressure (!) 92/41, pulse 84, temperature 98 F (36.7 C), temperature source Oral, resp. rate (!) 23, height _0  (1.651 m), weight 73.7 kg, SpO2 100 %.        Intake/Output Summary (Last 24 hours) at 03/02/2020 0732 Last data filed at 03/02/2020 0600 Gross per 24 hour  Intake 1155.01 ml  Output 560 ml  Net 595.01 ml   Filed Weights   02/28/20 1234 02/29/20 0500 03/01/20 0347  Weight: 73.5 kg 74.7 kg 73.7 kg   Examination: General: well appearing female HENT: MMM Lungs: breathing comfortably on 4L [place]. Bibasilar fine crackles. Cardiovascular: RRR, no LE edema. Peripheries warm Abdomen: soft, nontender, bs active Extremities: Feet and ankles erythematous and swollen bilaterally. Moves all four extremities. No LE edema. Neuro: alert and oriented to person, time, place, and situation  Resolved Hospital Problem list   n/a  Assessment & Plan:   Syncope. Suspect 2/2 acute on chronic cor pulmonale Acute on Chronic right sided heart failure/Idiopathic Pulmonary Fibrosis with pulmonary hypertension (last PASP 73). Echo on this admission  showing PASP of 152mHg with interventricular septal flattening and severe dilation and systolic dysfunction of RV. Although increased volume status suspected, she is still down about 10kg from March. RHC showed optimized filling pressures. Mean PA pressure 40, PASP 63. Moderate PAH with PVR 9.1 WU.   Lasix discontinued yesterday. Net positive 565 mL in past 24 hour. Cr stable. Plan -Continue Midodrine 7.5 mg TID. -continue Ofev for IPF -Will plan to start inhaled Tyvazo in outpatient setting.   Gout: Patient continues to experience severe pain and swelling of her feet bilaterally.  -PO prednisone -Continue colchicine and allopurinol. Bilateral heel wounds. Pt noting 2/2 gout. Continue dressing changes. Monitor for infection.  Asymptomatic Bacteriuria Urine culture from 7/5 with growth of >100,000 colonies of e. Coli, susceptibilities to follow. Given absence of urinary frequency, dysuria, hematuria, suspect colonization instead of active infection.  -treatment not indicated   Acute macrocytic anemia.  Hemoglobin 10.3, MCV 102.6. Iron panel from 7/6 significant for low TIBC (182), otherwise within normal limits. Given low TIBC and overall clinical picture, suspect anemia of chronic disease. No evidence of bleeding or hemolysis. Plan --continue to monitor with daily labs.  Hypokalemia. K 4.1 this morning. Hypomagnesemia. ctm  Plan: continue to monitor and replete as needed  Tropinemia in the setting of CAD. Last LHC in 2019 with 70% blockage in RCA however pt denies chest pain, dizziness at this time. Suspect this is due to demand ischemia.   Crohn's disease: continue sulfalazine OSA: CPAP COPD. Last PFTs in 2019 showing moderate to severe obstructive disease.  CAD/carotid stenosis: continue xarelto Hypertension: hold home antihypertensives Chronic hypoxic respiratory failure due to COPD/IPF on 5L at baseline. Remains on baseline supplemental O2.  Best practice:  Diet:  HH Pain/Anxiety/Delirium protocol (if indicated): tylenol VAP protocol (if indicated): n/a DVT prophylaxis: xarelto GI prophylaxis: n/a Glucose control: n/a Mobility: BR Code Status: DNR/DNI Family Communication: daughters updated at bedside Disposition: ICU  Labs   CBC: Recent Labs  Lab 02/28/20 1653 02/28/20 1653 02/29/20 0240 02/29/20 0240 03/01/20 0321 03/01/20 0905 03/01/20 0906 03/01/20 0907 03/02/20 0246  WBC 9.7  --  10.0  --  10.7*  --   --   --  11.6*  NEUTROABS 7.6  --   --   --   --   --   --   --   --   HGB 10.9*   < > 10.7*   < > 10.4* 10.5* 10.5* 9.9* 10.3*  HCT 34.3*   < > 33.4*   < > 32.8* 31.0* 31.0* 29.0* 31.6*  MCV 104.3*  --  103.1*  --  100.9*  --   --   --  102.6*  PLT 286  --  273  --  226  --   --   --  221   < > = values in this interval not displayed.    Basic Metabolic Panel: Recent Labs  Lab 02/28/20 1653 02/28/20 1653 02/29/20 0240 02/29/20 0240 02/29/20 1537 02/29/20 1537 02/29/20 1930 03/01/20 0321 03/01/20 0905 03/01/20 0906 03/01/20 0907 03/02/20 0246  NA 137   < > 140   < > 136   < >  --  136 138 137 142 136  K 3.4*   < > 3.1*   < > 4.0   < >  --  3.3* 3.9 4.0 3.5 4.1  CL 105  --  106  --  103  --   --  97*  --   --   --  102  CO2 19*  --  21*  --  24  --   --  27  --   --   --  25  GLUCOSE 88  --  94  --  136*  --   --  98  --   --   --  94  BUN 19  --  18  --  16  --   --  15  --   --   --  12  CREATININE 1.15*  --  0.91  --  0.98  --   --  0.86  --   --   --  0.78  CALCIUM 8.1*  --  8.3*  --  7.9*  --   --  8.3*  --   --   --  8.6*  MG  --   --  1.2*  --   --   --  2.0  --   --   --   --   --    < > = values in this interval not displayed.   GFR: Estimated Creatinine Clearance: 56.4 mL/min (by C-G formula based on SCr of 0.78 mg/dL). Recent Labs  Lab 02/28/20 1653 02/28/20 2057 02/29/20 0240 03/01/20 0321 03/02/20 0246  WBC 9.7  --  10.0 10.7* 11.6*  LATICACIDVEN 1.6 1.9  --   --   --     Liver Function  Tests: Recent Labs  Lab 02/28/20 1653  AST 53*  ALT 27  ALKPHOS 64  BILITOT 0.8  PROT 5.3*  ALBUMIN 2.1*   No results for input(s): LIPASE, AMYLASE in the last 168 hours. No results for input(s): AMMONIA in the last 168 hours.  ABG    Component Value Date/Time   HCO3 27.7 03/01/2020 0907   TCO2 29 03/01/2020 0907   ACIDBASEDEF 4.0 (H) 10/30/2017 1013   O2SAT 50.0 03/01/2020 0907     Coagulation Profile: No results for input(s): INR, PROTIME in the last 168 hours.  Cardiac Enzymes: No results for input(s): CKTOTAL, CKMB, CKMBINDEX, TROPONINI in the last 168 hours.  HbA1C: No results found for: HGBA1C  CBG: Recent Labs  Lab 02/28/20 1613 02/28/20 2302 02/29/20 1149  GLUCAP 57* 76 108*    Review of Systems:   Negative unless stated in HPI  Past Medical History  She,  has a past medical history of Anxiety, Carotid artery disease ([place]), Cataract, COPD (chronic obstructive pulmonary disease) ([place]), Coronary atherosclerosis of native coronary artery, Depression, DVT (deep venous thrombosis) ([place]), Essential hypertension, benign, Fracture (Left Great Toe), Hepatic steatosis, History of melanoma, History of oral aphthous ulcers, History of Salmonella gastroenteritis, adenomatous colonic polyps, Mixed hyperlipidemia, Myocardial infarction ([place]), Osteoarthritis, Osteoporosis, Pancreatitis, Rectal bleeding, Regional enteritis of large intestine ([place]), Sinus polyp, TIA (transient ischemic attack), and Tubular adenoma of colon (08/2012).   Surgical History    Past Surgical History:  Procedure Laterality Date  . CATARACT EXTRACTION Bilateral   . Melanoma resection  1996  . RIGHT HEART CATH N/A 03/01/2020   Procedure: RIGHT HEART CATH;  Surgeon: Larey Dresser, MD;  Location: [place] [facility];  Service: Cardiovascular;  Laterality: N/A;  . RIGHT/LEFT HEART CATH AND CORONARY ANGIOGRAPHY N/A 10/30/2017   Procedure: RIGHT/LEFT HEART CATH AND CORONARY ANGIOGRAPHY;  Surgeon: Larey Dresser, MD;  Location: [place] [facility];  Service: Cardiovascular;  Laterality: N/A;  . TOTAL KNEE ARTHROPLASTY Right Feb 2012     Social History   reports that she quit smoking about 2 years ago. Her smoking use included cigarettes. She started smoking about 62 years ago. She has a 40.00 pack-year smoking history. She has never used smokeless tobacco. She reports that she does not drink alcohol and does not use drugs.   Family History   Her family history includes Breast cancer in her sister; Deep vein thrombosis in her brother; Diabetes type II in her mother; Heart attack (age of onset: 4) in her mother; Heart disease in her mother; Hyperlipidemia in her sister; Hypertension in her sister; Other in her brother; Uterine cancer in her mother; Varicose Veins in her mother. There is no history of Colon cancer.   Allergies Allergies  Allergen Reactions  . Tape Other (See Comments)    SKIN HAS BECOME VERY THIN- TEARS EASILY!!  . Crestor [Rosuvastatin Calcium] Other (See Comments)    Muscle pain   . Doxycycline Other (See Comments)    Possible photosensitivity  . Mercaptopurine Other (See Comments)    Pancreatitis  . Simvastatin Other (See Comments)    Severe leg aches  . Penicillins Rash    Has patient had a PCN reaction causing immediate rash, facial/tongue/throat swelling, SOB or lightheadedness with hypotension: No Has patient had a PCN reaction causing severe rash involving mucus membranes or skin necrosis: No Has patient had a PCN reaction that required hospitalization: No Has patient had a PCN reaction occurring within the last 10 years: No If all of the above answers are "NO", then may proceed with Cephalosporin use.   . Sulfa Antibiotics Rash     Home Medications  Prior to Admission medications   Medication Sig Start Date End Date Taking? Authorizing Provider  albuterol (PROAIR HFA) 108 (90 Base) MCG/ACT inhaler Inhale 2 puffs into the lungs every 4 (four) hours as needed  for wheezing or shortness of breath. 06/14/19   Parrett, Fonnie Mu, NP  aspirin 81 MG tablet Take 81 mg by mouth daily.      [provider]  calcium citrate-vitamin D (CITRACAL+D) 315-200 MG-UNIT tablet Take 1 tablet by mouth daily.     [provider]  colchicine 0.6 MG tablet Take 1 tablet (0.6 mg total) by mouth daily. 12/20/19   Larey Dresser, MD  ferrous sulfate 325 (65 FE) MG tablet Take 325 mg by mouth daily.    [provider]  FLUoxetine (PROZAC) 40 MG capsule Take 40 mg by mouth daily.    [provider]  folic acid (FOLVITE) 1 MG tablet TAKE 1 TABLET BY MOUTH EVERY DAY 02/11/19   Mauri Pole, MD  HYDROcodone-acetaminophen (NORCO) 10-325 MG tablet Take 1 tablet by mouth every 6 (six) hours as needed for moderate pain.     [provider]  metoprolol succinate (TOPROL-XL) 25 MG 24 hr tablet TAKE 1 TABLET BY MOUTH EVERY DAY 11/09/19   Satira Sark, MD  Misc Natural Products (OSTEO BI-FLEX JOINT SHIELD PO) Take 1 capsule by mouth daily.    [provider]  nitroGLYCERIN (NITROSTAT) 0.4 MG SL tablet Place 1 tablet (0.4 mg total) under the tongue every 5 (five) minutes x 3 doses as needed. 07/13/19   Larey Dresser, MD  OFEV 100 MG CAPS TAKE 1 CAPSULE TWICE A DAY 12/27/19   Mannam, Hart Robinsons, MD  Omega-3 Fatty Acids (FISH OIL)  1200 MG CAPS Take 1,200 mg by mouth daily.     [provider]  potassium chloride SA (KLOR-CON) 20 MEQ tablet TAKE 1 TABLET BY MOUTH TWICE DAILY 02/17/20   Larey Dresser, MD  predniSONE (DELTASONE) 10 MG tablet Take 64m (4 tabs) for 2 days, then 333m(3 tabs) for 2 days, then 2033m2 tabs) for 2 days then 24m26m tab) for 2 days 12/20/19   McLeLarey Dresser  spironolactone (ALDACTONE) 25 MG tablet TAKE 1 TABLET BY MOUTH EVERY DAY 12/10/19   McLeLarey Dresser  sulfaSALAzine (AZULFIDINE) 500 MG tablet Take 1 tablet (500 mg total) by mouth 2 (two) times daily. 03/13/17   NandMauri Pole   torsemide (DEMADEX) 20 MG tablet Take 4 tablets (80 mg total) by mouth daily. 12/20/19 03/19/20  McLeLarey Dresser  XARELTO 2.5 MG TABS tablet TAKE 1 TABLET BY MOUTH TWICE DAILY 09/08/19   McLeLarey Dresser     ClayRiccardo Dubin4

## 2020-03-02 NOTE — Progress Notes (Signed)
Pt transported to 5M01, oriented to new room, placed on telemetry box.

## 2020-03-02 NOTE — Progress Notes (Addendum)
Patient ID: Shelley [name], female   DOB: 1941/07/12, 79 y.o.   MRN: [id number]     Advanced Heart Failure Rounding Note  PCP-Cardiologist: Rozann Lesches, MD   Subjective:    Complaining of fatigue. SOB with exertion.   RHC Procedural Findings: Hemodynamics (mmHg) RA mean 4 RV 66/5 PA 63/27, mean 40 PCWP mean 6 Oxygen saturations: SVC 50% PA 54% AO 96% Cardiac Output (Fick) 3.71  Cardiac Index (Fick) 2.04 PVR 9.1 WU  Objective:   Weight Range: 73.7 kg Body mass index is 27.04 kg/m.   Vital Signs:   Temp:  [97.4 F (36.3 C)-98.2 F (36.8 C)] 97.4 F (36.3 C) (07/08 1107) Pulse Rate:  [80-140] 89 (07/08 1100) Resp:  [21-29] 22 (07/08 1100) BP: (77-117)/(39-63) 102/55 (07/08 1100) SpO2:  [94 %-100 %] 94 % (07/08 1100) Last BM Date: 03/01/20  Weight change: Filed Weights   02/28/20 1234 02/29/20 0500 03/01/20 0347  Weight: 73.5 kg 74.7 kg 73.7 kg    Intake/Output:   Intake/Output Summary (Last 24 hours) at 03/02/2020 1230 Last data filed at 03/02/2020 1200 Gross per 24 hour  Intake 1857 ml  Output 760 ml  Net 1097 ml      Physical Exam    General:  No resp difficulty HEENT: normal Neck: supple. no JVD. Carotids 2+ bilat; no bruits. No lymphadenopathy or thryomegaly appreciated. Cor: PMI nondisplaced. Tachy Regular rate & rhythm. No rubs, gallops or murmurs. Lungs: clear on 5 liters.  Abdomen: soft, nontender, nondistended. No hepatosplenomegaly. No bruits or masses. Good bowel sounds. Extremities: no cyanosis, clubbing, rash, edema Neuro: alert & orientedx3, cranial nerves grossly intact. moves all 4 extremities w/o difficulty. Affect pleasant .    Telemetry   SVT 150s has brief episodes this morning. Back SVT 150s while I was in the room.    Labs    CBC Recent Labs    02/28/20 1653 02/29/20 0240 03/01/20 0321 03/01/20 0905 03/01/20 0907 03/02/20 0246  WBC 9.7   < > 10.7*  --   --  11.6*  NEUTROABS 7.6  --   --   --   --   --   HGB  10.9*   < > 10.4*   < > 9.9* 10.3*  HCT 34.3*   < > 32.8*   < > 29.0* 31.6*  MCV 104.3*   < > 100.9*  --   --  102.6*  PLT 286   < > 226  --   --  221   < > = values in this interval not displayed.   Basic Metabolic Panel Recent Labs    02/29/20 0240 02/29/20 1537 02/29/20 1930 03/01/20 0321 03/01/20 0905 03/01/20 0907 03/02/20 0246  NA 140   < >  --  136   < > 142 136  K 3.1*   < >  --  3.3*   < > 3.5 4.1  CL 106   < >  --  97*  --   --  102  CO2 21*   < >  --  27  --   --  25  GLUCOSE 94   < >  --  98  --   --  94  BUN 18   < >  --  15  --   --  12  CREATININE 0.91   < >  --  0.86  --   --  0.78  CALCIUM 8.3*   < >  --  8.3*  --   --  8.6*  MG 1.2*  --  2.0  --   --   --   --    < > = values in this interval not displayed.   Liver Function Tests Recent Labs    02/28/20 1653  AST 53*  ALT 27  ALKPHOS 64  BILITOT 0.8  PROT 5.3*  ALBUMIN 2.1*   No results for input(s): LIPASE, AMYLASE in the last 72 hours. Cardiac Enzymes No results for input(s): CKTOTAL, CKMB, CKMBINDEX, TROPONINI in the last 72 hours.  BNP: BNP (last 3 results) Recent Labs    02/28/20 1653  BNP 2,646.6*    ProBNP (last 3 results) No results for input(s): PROBNP in the last 8760 hours.   D-Dimer No results for input(s): DDIMER in the last 72 hours. Hemoglobin A1C No results for input(s): HGBA1C in the last 72 hours. Fasting Lipid Panel No results for input(s): CHOL, HDL, LDLCALC, TRIG, CHOLHDL, LDLDIRECT in the last 72 hours. Thyroid Function Tests No results for input(s): TSH, T4TOTAL, T3FREE, THYROIDAB in the last 72 hours.  Invalid input(s): FREET3  Other results:   Imaging    No results found.   Medications:     Scheduled Medications: . aspirin EC  81 mg Oral Daily  . Chlorhexidine Gluconate Cloth  6 each Topical Q0600  . colchicine  0.6 mg Oral Daily  . feeding supplement (ENSURE ENLIVE)  237 mL Oral BID BM  . ferrous sulfate  325 mg Oral Daily  . FLUoxetine  40  mg Oral Daily  . folic acid  1 mg Oral Daily  . mouth rinse  15 mL Mouth Rinse BID  . midodrine  7.5 mg Oral TID WC  . multivitamin with minerals  1 tablet Oral Daily  . Nintedanib  1 capsule Oral BID  . predniSONE  40 mg Oral Q breakfast  . rivaroxaban  2.5 mg Oral BID  . sodium chloride flush  3 mL Intravenous Q12H  . sulfaSALAzine  500 mg Oral BID  . torsemide  80 mg Oral Daily    Infusions: . sodium chloride 10 mL/hr at 03/01/20 2050  . sodium chloride 250 mL (03/01/20 0540)    PRN Medications: docusate sodium, HYDROcodone-acetaminophen, polyethylene glycol, Zinc Oxide   Assessment/Plan   1. Syncope: Concerned that this could have been due to severe RV failure and pulmonary hypertension.  She has mildly hypotensive here, now weaned off norepinephrine to midodrine.  Echo shows severely dilated RV with severe RV dysfunction and PASP 110 mmHg. Volume status now improved. No significant arrhythmias noted so far.  - Will follow on telemetry, but suspect that event was less likely arrhythmic and more likely a vagal event while using the bathroom in setting of severe RV failure/pulmonary hypertension.  - Continue midodrine given RV failure.  2. Elevated Troponin: Noted at [facility].  She does have history of CAD (h/o BMS to RCA in 2004; LHC in 3/19 showed 70% in-stent restenosis in the RCA) but suspect this was demand ischemia in setting of RV failure/volume overload.  - Continue ASA 81.  - She has extensive vascular disease. Based on COMPASS study, I have her on rivaroxaban 2.5 mg bid.  -She has not been able to tolerate statins due to myalgias. She has an appointment soon inlipid clinic to get started with Repatha.  3. RV failure with severe pulmonary hypertension: CTA chest in 1/19 showed chronic fibrotic changes in the lungs but not emphysema, IPF diagnosed by Dr. Lake Bells. However, PFTs in 3/19  were suggestive of moderate-severe obstruction consistent with COPD. RHC  (3/19) showed moderate pulmonary arterial hypertension. V/Q scan showed no evidence for chronic pulmonary embolus. Echo this admission with EF 55%, moderate LVH, D-shaped septum, RV severely enlarged with severely decreased systolic function, PASP 916 mmHg, moderate-severe TR, IVC dilated suggesting RA pressure 15 mmHg.  She is thought to have group 3 PH due to COPD and interstitial lung disease (IPF), she has OSA as well.  We had been planning repeat RHC with eye toward starting Tyvaso based on INCREASE trial in ILD.   BNP was high and IVC was dilated on echo with IV septum left-shifted. She has been diuresed with good response.  RHC today with optimized filling pressures after diuresis and moderate pulmonary arterial hypertension with high PVR. - Volume status stable. Start torsemide 80 mg daily. - Continue midodrine long-term with severe RV dysfunction, continue 7.5 mg tid.   -Working  on adding Tyvaso  based on INCREASE trial, PCWP now optimized.  Suspect this will have to be done as outpatient but will check with pharmacy.  4. Pulmonary fibrosis: IPF. She is now on Ofev.  5. Carotid stenosis: Followed by [facility]. 6. PAD: Peripheral arterial dopplers (3/19) showed occluded left SFA.She has bilateral leg pain, but pain is not typical for claudication.  - Sees Dr. Oneida Alar, he has so far recommended medical management.  - She has quit smoking.  7. COPD: She has now quit smoking.  8. Gout: Severe gout recently, seems to be flaring now in setting of diuresis.  - Continue colchicine, will need allopurinol eventually.  - Starting prednisone today.  - May benefit from prednisone burst, per primary team.  9. Weight loss: Poor appetite/early satiety, ?due to RV failure at least in part.  - Ensure bid.  10. UTI: Has E coli in urine, will need antibiotic coverage per primary team. 11. SVT- In and out today. Longer run this afternoon. Give 5 mg lopressor now. Start toprol XL 25 mg daily.   Length of Stay:  3  Amy Clegg, NP  03/02/2020, 12:30 PM  Advanced Heart Failure Team Pager [phone] (M-F; 7a - 4p)  Please contact [facility] for night-coverage after hours (4p -7a ) and weekends on [email or web address]  Patient seen with NP, agree with the above note.   No significant dyspnea today.  Main complaint remains gout in feet, has started prednisone.   She has had runs of SVT today, regular 150s.  All episodes have stopped on their own.   General: NAD Neck: No JVD, no thyromegaly or thyroid nodule.  Lungs: Clear to auscultation bilaterally with normal respiratory effort. CV: Nondisplaced PMI.  Heart regular S1/S2, no S3/S4, no murmur.  No peripheral edema.  Abdomen: Soft, nontender, no hepatosplenomegaly, no distention.  Skin: Intact without lesions or rashes.  Neurologic: Alert and oriented x 3.  Psych: Normal affect. Extremities: No clubbing or cyanosis.  HEENT: Normal.   Creatinine stable today, can restart torsemide 80 mg daily.  Continue midodrine at current dose.   SVT, possible AVNRT.  Episodes have been short.  BP is stable with episodes, no lightheadedness.  We are going to start her back on beta blocker, Toprol XL 25 mg daily.  She got a bolus of IV Lopressor.   Gout per primary team, starting prednisone.   She has ILD-related pulmonary hypertension, will work on getting her Tyvaso as outpatient.   Loralie Champagne 03/02/2020 1:06 PM

## 2020-03-02 NOTE — Plan of Care (Signed)

## 2020-03-02 NOTE — Progress Notes (Signed)
Attempted to call report to [phone], receiving RN unable to take call at this time. Provided number for call back.

## 2020-03-02 NOTE — Evaluation (Signed)
Occupational Therapy Evaluation Patient Details Name: Shelley [name] MRN: [id number] DOB: 09-01-1940 Today's Date: 03/02/2020    History of Present Illness 29 yof with hypertension, HLD, HFpEF, CAD s/p stent placement, IPF with pulm hypertension, Chrohn's disease, and depression who was transferred from [facility] ED for syncope>hypotension requiring pressor support.    Clinical Impression   PTA, pt was living at home with her daughter, pt reports she was independent with ADL and required assistance from family for IADL. Pt was modified independent with functional mobility at rollator level. Pt currently significantly limited due to pain from gout in bilateral feet. Attempted to progress pt to EOB, pt began progressing BLE but stopped due to increased pain and declined further mobility to EOB. Pt agreeable to rolling in bed and bed level exercises. She required minA for rolling R<>L. Due to decline in current level of function, pt would benefit from acute OT to address established goals to facilitate safe D/C to venue listed below. At this time, recommend SNF follow-up. Will continue to assess d/c plan based on pt's progression and management of pain. Will continue to follow acutely.     Follow Up Recommendations  SNF (will continue to assess and update d/c as appropriate)    Equipment Recommendations  None recommended by OT    Recommendations for Other Services       Precautions / Restrictions Precautions Precautions: Fall Restrictions Weight Bearing Restrictions: No      Mobility Bed Mobility Overal bed mobility: Needs Assistance Bed Mobility: Rolling Rolling: Min assist         General bed mobility comments: pt agreeable to rolling R<>L in bed;pt declined further mobility, attempted to progress to EOB but pt stopped and declined due to increased pain in BLE  Transfers                 General transfer comment: pt declined    Balance                                            ADL either performed or assessed with clinical judgement   ADL Overall ADL's : Needs assistance/impaired Eating/Feeding: Set up;Sitting;Bed level   Grooming: Set up;Sitting;Bed level   Upper Body Bathing: Minimal assistance;Bed level   Lower Body Bathing: Maximal assistance;Bed level   Upper Body Dressing : Minimal assistance   Lower Body Dressing: Maximal assistance Lower Body Dressing Details (indicate cue type and reason): pt reports using AE at baseline, pt required assistance to don/doff socks   Toilet Transfer Details (indicate cue type and reason): deferred   Toileting - Clothing Manipulation Details (indicate cue type and reason): deferred       General ADL Comments: pt declined OOB mobility and declined progression to EOB due to increased pain in BLE with movement;limited to bed level session     Vision Baseline Vision/History: Wears glasses Patient Visual Report: No change from baseline Vision Assessment?: No apparent visual deficits     Perception     Praxis      Pertinent Vitals/Pain Pain Assessment: 0-10 Pain Score: 10-Worst pain ever Pain Location: bilateral feet Pain Descriptors / Indicators: Sore;Constant Pain Intervention(s): Limited activity within patient's tolerance;Monitored during session;Repositioned     Hand Dominance Right   Extremity/Trunk Assessment Upper Extremity Assessment Upper Extremity Assessment: Generalized weakness   Lower Extremity Assessment Lower Extremity Assessment: RLE deficits/detail;LLE deficits/detail RLE  Deficits / Details: able to wiggle toes;pt limited by pain, reports shes unable to flex knees, able to adduct and abduct with increased effort RLE: Unable to fully assess due to pain RLE Sensation:  (increased sensitivity to touch) RLE Coordination: decreased fine motor;decreased gross motor LLE Deficits / Details: same as RLE LLE: Unable to fully assess due to pain LLE  Coordination: decreased fine motor;decreased gross motor       Communication Communication Communication: No difficulties   Cognition Arousal/Alertness: Awake/alert Behavior During Therapy: WFL for tasks assessed/performed Overall Cognitive Status: Within Functional Limits for tasks assessed                                     General Comments  vss on 4lnc    Exercises Exercises: General Upper Extremity General Exercises - Upper Extremity Shoulder Flexion: AROM;Both;10 reps;Supine Shoulder Extension: AROM;Both;10 reps;Supine Shoulder ABduction: AROM;Both;10 reps;Supine Shoulder ADduction: AROM;Both;10 reps;Supine Elbow Flexion: AROM;Both;10 reps;Supine Elbow Extension: AROM;Both;10 reps;Supine   Shoulder Instructions      Home Living Family/patient expects to be discharged to:: Private residence Living Arrangements: Children Available Help at Discharge: Family;Available 24 hours/day Type of Home: House Home Access: Stairs to enter [employer] of Steps: 4 Entrance Stairs-Rails: Can reach both Home Layout: Two level;Able to live on main level with bedroom/bathroom         Bathroom Toilet: Standard     Home Equipment: Walker - 4 wheels;Bedside commode;Shower seat;Adaptive equipment Adaptive Equipment: Sock aid;Reacher        Prior Functioning/Environment Level of Independence: Needs assistance  Gait / Transfers Assistance Needed: pt was modified independent with functional mobility at rollator level ADL's / Homemaking Assistance Needed: family assisted with IADL, pt independent with ADL;AE for LB dressing   Comments: pt on 5lnc at home        OT Problem List: Decreased range of motion;Decreased activity tolerance;Decreased strength;Obesity;Pain      OT Treatment/Interventions: Self-care/ADL training;Therapeutic exercise;DME and/or AE instruction;Therapeutic activities;Patient/family education;Balance training    OT Goals(Current  goals can be found in the care plan section) Acute Rehab OT Goals Patient Stated Goal: to manage gout pain OT Goal Formulation: With patient Time For Goal Achievement: 03/16/20 Potential to Achieve Goals: Good ADL Goals Pt Will Perform Grooming: with modified independence;sitting;standing Pt Will Perform Lower Body Dressing: with modified independence;sit to/from stand;with adaptive equipment Pt Will Transfer to Toilet: with min assist;ambulating Additional ADL Goal #1: Pt will progress to EOB independently in preparation for ADL completion.  OT Frequency: Min 2X/week   Barriers to D/C:            Co-evaluation              AM-PAC OT "6 Clicks" Daily Activity     Outcome Measure Help from another person eating meals?: A Little Help from another person taking care of personal grooming?: A Little Help from another person toileting, which includes using toliet, bedpan, or urinal?: Total Help from another person bathing (including washing, rinsing, drying)?: A Lot Help from another person to put on and taking off regular upper body clothing?: A Lot Help from another person to put on and taking off regular lower body clothing?: Total 6 Click Score: 12   End of Session Equipment Utilized During Treatment: Oxygen Nurse Communication: Mobility status  Activity Tolerance: Patient limited by pain Patient left: in bed;with call bell/phone within reach;with bed alarm set  OT Visit  Diagnosis: Other abnormalities of gait and mobility (R26.89);Muscle weakness (generalized) (M62.81);Pain Pain - Right/Left:  (bilateral ) Pain - part of body: Leg;Ankle and joints of foot                Time: 0929-5747 OT Time Calculation (min): 29 min Charges:  OT General Charges $OT Visit: 1 Visit OT Evaluation $OT Eval Moderate Complexity: 1 Mod OT Treatments $Self Care/Home Management : 8-22 mins  Helene Kelp OTR/L Acute Rehabilitation Services Office: [phone]   Wyn Forster 03/02/2020,  11:38 AM

## 2020-03-02 NOTE — Progress Notes (Signed)
NEW ADMISSION NOTE New Admission Note:   Arrival Method: hospital bed from 42M. Mental Orientation: alert and oriented x4  Telemetry: box: 08 NSR Assessment: Completed Skin: Stage 3 bilateral sides of sacrum, stage 1 bilateral heels, skin tears on bilateral arms, MASD groin. IV: RFA and LW Pain: 3 feet d/t gout  Safety Measures: Safety Fall Prevention Plan has been given, discussed and signed Admission: Completed [facility] [place] Orientation: Patient has been orientated to the room, unit and staff.  Family: NA  Orders have been reviewed and implemented. Will continue to monitor the patient. Call light has been placed within reach and bed alarm has been activated.   Baldo Ash, RN

## 2020-03-03 ENCOUNTER — Telehealth: Payer: Self-pay | Admitting: Pulmonary Disease

## 2020-03-03 LAB — BASIC METABOLIC PANEL WITH GFR
Anion gap: 10 (ref 5–15)
BUN: 19 mg/dL (ref 8–23)
CO2: 26 mmol/L (ref 22–32)
Calcium: 8.9 mg/dL (ref 8.9–10.3)
Chloride: 99 mmol/L (ref 98–111)
Creatinine, Ser: 0.78 mg/dL (ref 0.44–1.00)
GFR calc Af Amer: >60 mL/min
GFR calc non Af Amer: >60 mL/min
Glucose, Bld: 141 mg/dL — ABNORMAL HIGH (ref 70–99)
Potassium: 3.5 mmol/L (ref 3.5–5.1)
Sodium: 135 mmol/L (ref 135–145)

## 2020-03-03 LAB — CBC
HCT: 31.4 % — ABNORMAL LOW (ref 36.0–46.0)
Hemoglobin: 10.1 g/dL — ABNORMAL LOW (ref 12.0–15.0)
MCH: 33 pg (ref 26.0–34.0)
MCHC: 32.2 g/dL (ref 30.0–36.0)
MCV: 102.6 fL — ABNORMAL HIGH (ref 80.0–100.0)
Platelets: 245 K/uL (ref 150–400)
RBC: 3.06 MIL/uL — ABNORMAL LOW (ref 3.87–5.11)
RDW: 17.6 % — ABNORMAL HIGH (ref 11.5–15.5)
WBC: 13.9 K/uL — ABNORMAL HIGH (ref 4.0–10.5)
nRBC: 0 % (ref 0.0–0.2)

## 2020-03-03 MED ORDER — POTASSIUM CHLORIDE CRYS ER 20 MEQ PO TBCR
20.0000 meq | EXTENDED_RELEASE_TABLET | Freq: Two times a day (BID) | ORAL | Status: DC
  Administered 2020-03-03 – 2020-03-08 (×11): 20 meq via ORAL
  Filled 2020-03-03 (×11): qty 1

## 2020-03-03 NOTE — NC FL2 (Signed)
[place] LEVEL OF CARE SCREENING TOOL     IDENTIFICATION  Patient Name: Shelley [name] Birthdate: Sep 22, 1940 Sex: female Admission Date (Current Location): 02/28/2020  [facility] and [place] Number:  [employer] and Address:         Provider Number: [id number]  Attending Physician Name and Address:  Edwin Dada, *  Relative Name and Phone Number:  Madalina Rosman - daughter; [phone]    Current Level of Care: Hospital Recommended Level of Care: [place] Prior Approval Number:    Date Approved/Denied:   PASRR Number: [id number]  Discharge Plan: SNF    Current Diagnoses: Patient Active Problem List   Diagnosis Date Noted  . Pressure injury of skin 02/28/2020  . Hypotension 02/28/2020  . Pulmonary embolus ([employer]) 02/28/2020  . Crohn's disease of colon without complication ([place]) 68/61/6837  . OSA on CPAP 09/21/2018  . Abnormal thyroid biopsy 04/09/2018  . Multinodular goiter 03/19/2018  . Pulmonary artery hypertension ([place]) 02/12/2018  . Chronic respiratory failure with hypoxia ([place]) 02/12/2018  . IPF (idiopathic pulmonary fibrosis) ([place]) 02/12/2018  . Snoring 02/12/2018  . Thyroid nodule 02/12/2018  . Dry mouth 02/12/2018  . Malnutrition of moderate degree ([place]) 02/12/2018  . Carotid stenosis 06/17/2014  . CORONARY ATHEROSCLEROSIS, NATIVE VESSEL 06/07/2009  . COPD without exacerbation ([place]) 05/02/2009  . Mixed hyperlipidemia 10/31/2008  . TOBACCO ABUSE 10/31/2008  . Bilateral carotid artery occlusion 10/31/2008  . TIA 10/27/2007    Orientation RESPIRATION BLADDER Height & Weight     Self, Time, Situation, Place  O2 (4 Liters Oxygen) Incontinent, External catheter (Placed 7/5) Weight: 162 lb 7.7 oz (73.7 kg) Height:  _0  (165.1 cm)  BEHAVIORAL SYMPTOMS/MOOD NEUROLOGICAL BOWEL NUTRITION STATUS      Continent Diet (Heart healthy)  AMBULATORY STATUS COMMUNICATION OF NEEDS Skin   Total Care (Unable to  ambulate with PT) Verbally Other (Comment) (Stage 3 pressure injuries to left and right buttocks; Stage 1 pressure injuries to left and right heel; Ecchymosis right and left arm; MASD to groin, cleansed and barrier cream placed; Skin tear to right/left arm)                       Personal Care Assistance Level of Assistance  Bathing, Feeding, Dressing Bathing Assistance: Maximum assistance (Upper body min assist; Lower body max assist) Feeding assistance: Limited assistance (Assistance with set-up) Dressing Assistance: Maximum assistance (Upper body min assist; Lower body max assist)     Functional Limitations Info  Sight, Hearing, Speech Sight Info: Impaired (Wears glasses) Hearing Info: Adequate Speech Info: Adequate    SPECIAL CARE FACTORS FREQUENCY  PT (By licensed PT), OT (By licensed OT)     PT Frequency: Evaluated 7/9. PT art SNF Eval and Treat, a mnimum of 5 days per week OT Frequency: Evaluated 7/8. OT at SNF Eval and Treat, a minimum of 5 days per week            Contractures Contractures Info: Not present    Additional Factors Info  Code Status, Allergies Code Status Info: DNR Allergies Info: Crestor, Doxycycline, Penicillins, Mercaptopurine, Sulfasalazine, Simvastatin, Sulfa Antibiotics           Current Medications (03/03/2020):  This is the current hospital active medication list Current Facility-Administered Medications  Medication Dose Route Frequency Provider Last Rate Last Admin  . 0.9 %  sodium chloride infusion  250 mL Intravenous Continuous Candee Furbish, MD 10 mL/hr at 03/01/20 2050 IV  [employer] at 03/01/20 2050  . 0.9 %  sodium chloride infusion  250 mL Intravenous Continuous Darrick Meigs, Rylee, MD 15 mL/hr at 03/01/20 0540 250 mL at 03/01/20 0540  . aspirin EC tablet 81 mg  81 mg Oral Daily Christian, Rylee, MD   81 mg at 03/03/20 1013  . Chlorhexidine Gluconate Cloth 2 % PADS 6 each  6 each Topical Q0600 Kipp Brood, MD   6 each at  03/01/20 1535  . colchicine tablet 0.6 mg  0.6 mg Oral Daily Christian, Rylee, MD   0.6 mg at 03/03/20 1013  . docusate sodium (COLACE) capsule 100 mg  100 mg Oral BID PRN Christian, Rylee, MD      . feeding supplement (ENSURE ENLIVE) (ENSURE ENLIVE) liquid 237 mL  237 mL Oral BID BM Larey Dresser, MD   237 mL at 03/03/20 1022  . ferrous sulfate tablet 325 mg  325 mg Oral Daily Christian, Rylee, MD   325 mg at 03/03/20 1015  . FLUoxetine (PROZAC) capsule 40 mg  40 mg Oral Daily Christian, Rylee, MD   40 mg at 03/03/20 1016  . folic acid (FOLVITE) tablet 1 mg  1 mg Oral Daily Christian, Rylee, MD   1 mg at 03/03/20 1016  . HYDROcodone-acetaminophen (NORCO/VICODIN) 5-325 MG per tablet 1 tablet  1 tablet Oral Q4H PRN Candee Furbish, MD   1 tablet at 03/02/20 0802  . MEDLINE mouth rinse  15 mL Mouth Rinse BID Candee Furbish, MD   15 mL at 03/02/20 2136  . metoprolol succinate (TOPROL-XL) 24 hr tablet 25 mg  25 mg Oral Daily Clegg, Amy D, NP   25 mg at 03/02/20 1446  . midodrine (PROAMATINE) tablet 7.5 mg  7.5 mg Oral TID WC Larey Dresser, MD   7.5 mg at 03/03/20 1230  . mirtazapine (REMERON) tablet 7.5 mg  7.5 mg Oral QHS Edwin Dada, MD   7.5 mg at 03/02/20 2136  . multivitamin with minerals tablet 1 tablet  1 tablet Oral Daily Candee Furbish, MD   1 tablet at 03/03/20 1016  . Nintedanib (Ofev) CAPS 100 mg - Patient supplied  1 capsule Oral BID Darrick Meigs, Rylee, MD   100 mg at 03/03/20 0717  . polyethylene glycol (MIRALAX / GLYCOLAX) packet 17 g  17 g Oral Daily PRN Christian, Rylee, MD      . potassium chloride SA (KLOR-CON) CR tablet 20 mEq  20 mEq Oral BID Larey Dresser, MD   20 mEq at 03/03/20 1015  . predniSONE (DELTASONE) tablet 40 mg  40 mg Oral Q breakfast Danford, Suann Larry, MD   40 mg at 03/03/20 1013  . rivaroxaban (XARELTO) tablet 2.5 mg  2.5 mg Oral BID Darrick Meigs, Rylee, MD   2.5 mg at 03/03/20 1015  . sodium chloride flush (NS) 0.9 % injection 3 mL  3 mL  Intravenous Q12H Clegg, Amy D, NP   3 mL at 03/02/20 2137  . sulfaSALAzine (AZULFIDINE) tablet 500 mg  500 mg Oral BID Darrick Meigs, Rylee, MD   500 mg at 03/03/20 1015  . torsemide (DEMADEX) tablet 80 mg  80 mg Oral Daily Larey Dresser, MD   80 mg at 03/03/20 1015  . Zinc Oxide (TRIPLE PASTE) 12.8 % ointment   Topical PRN Candee Furbish, MD   Given at 02/29/20 1833     Discharge Medications: Please see discharge summary for a list of discharge medications.  Relevant Imaging Results:  Relevant  Lab Results:   Additional Information [id number]  Sable Feil, LCSW

## 2020-03-03 NOTE — Evaluation (Signed)
Physical Therapy Evaluation Patient Details Name: Shelley [name] MRN: [id number] DOB: 10-20-1940 Today's Date: 03/03/2020   History of Present Illness  53 yof with hypertension, HLD, HFpEF, CAD s/p stent placement, IPF with pulm hypertension, Chrohn's disease, and depression who was transferred from [facility] ED for syncope>hypotension requiring pressor support.   Clinical Impression  Patient presents with pain in bil ankles/knees, dyspnea on exertion, light headedness, decreased activity tolerance, impaired balance and impaired mobility s/p above. Pt lives with daughter and reports being mod I with rollator PTA. Reports since her gout flare ups, she has not been as mobile and has been working with PT. Today, pt requires Mod A of 2 for SPT to chair with bil knee instability and use of RW. Noted to have light headedness once upright and sitting in chair. BP 107/62, RN aware. Recommend stedy for transfer back to bed. Pt is not safe to be home at this time due to being high fall risk. Limited mainly by pain and weakness. Would benefit from SNF to maximize independence and mobility prior to return home. Will follow acutely.    Follow Up Recommendations SNF;Supervision for mobility/OOB    Equipment Recommendations  None recommended by PT    Recommendations for Other Services       Precautions / Restrictions Precautions Precautions: Fall Precaution Comments: Hx of gout per report that is not managed by medications in bil ankles/knees. Restrictions Weight Bearing Restrictions: No      Mobility  Bed Mobility Overal bed mobility: Needs Assistance Bed Mobility: Rolling;Sidelying to Sit Rolling: Min guard Sidelying to sit: Min guard;HOB elevated       General bed mobility comments: Able to get to EOB with use of rail and increased time. +lightheadedness. Needed to sit EOB for a little while "to get my feet used to touching the floor."  Transfers Overall transfer level: Needs  assistance Equipment used: Rolling walker (2 wheeled) Transfers: Sit to/from [employer] Sit to Stand: Mod assist;From elevated surface Stand pivot transfers: Mod assist;+2 physical assistance       General transfer comment: Assist to power to standing with narrow BoS and BLEs locked into knee extension; cues to widen feet and upright due to Flexed trunk. SPT bed to chair with bil knee instability. Sitting prematurely.  Ambulation/Gait             General Gait Details: Unable  Stairs            Wheelchair Mobility    Modified Rankin (Stroke Patients Only)       Balance Overall balance assessment: Needs assistance Sitting-balance support: Feet supported;No upper extremity supported Sitting balance-Leahy Scale: Good Sitting balance - Comments: supervision   Standing balance support: During functional activity Standing balance-Leahy Scale: Poor Standing balance comment: Requires UE support and external support for standing, bil knees locked out into extension                             Pertinent Vitals/Pain Pain Assessment: Faces Faces Pain Scale: Hurts whole lot Pain Location: bilateral feet, knees Pain Descriptors / Indicators: Sore;Constant ("hard to describe") Pain Intervention(s): Monitored during session;Limited activity within patient's tolerance;Repositioned    Home Living Family/patient expects to be discharged to:: Private residence Living Arrangements: Children Available Help at Discharge: Family;Available 24 hours/day Type of Home: House Home Access: Stairs to enter Entrance Stairs-Rails: Can reach both Entrance Stairs-Number of Steps: 4 vs 1 step into den (where room  now is) Home Layout: Two level;Able to live on main level with bedroom/bathroom Home Equipment: Walker - 4 wheels;Bedside commode;Shower seat;Adaptive equipment      Prior Function Level of Independence: Needs assistance   Gait / Transfers Assistance  Needed: pt was modified independent with functional mobility at rollator level; gave up driving last year due to gout flare ups.  ADL's / Homemaking Assistance Needed: family assisted with IADL, pt independent with ADL;AE for LB dressing. Lately since gout flare ups, daughter has been taking her out to her [address] and giving her a shower outside  Comments: pt on 5lnc at home     Hand Dominance   Dominant Hand: Right    Extremity/Trunk Assessment   Upper Extremity Assessment Upper Extremity Assessment: Defer to OT evaluation    Lower Extremity Assessment Lower Extremity Assessment: RLE deficits/detail;LLE deficits/detail RLE Deficits / Details: Limited ankle AROM but improved from yesterday per pt report, able to perform QS and LAQ with some pain. RLE Sensation: WNL LLE Deficits / Details: Limited ankle AROM but improved from yesterday per pt report, able to perform QS and LAQ with some pain. LLE Sensation: WNL       Communication   Communication: No difficulties  Cognition Arousal/Alertness: Awake/alert Behavior During Therapy: WFL for tasks assessed/performed Overall Cognitive Status: Within Functional Limits for tasks assessed                                        General Comments General comments (skin integrity, edema, etc.): Pt on 5L/min 02 [place] which is baseline. BP 107/62 sitting inc hair, reporting dizziness. RN aware.    Exercises General Exercises - Lower Extremity Ankle Circles/Pumps: AROM;Both;10 reps;Supine Quad Sets: AROM;Both;5 reps;Supine Long Arc Quad: AROM;Both;5 reps;Seated   Assessment/Plan    PT Assessment Patient needs continued PT services  PT Problem List Decreased strength;Decreased mobility;Decreased range of motion;Decreased balance;Pain;Cardiopulmonary status limiting activity       PT Treatment Interventions Therapeutic activities;Gait training;Therapeutic exercise;Patient/family education;Balance training;Stair  training;Functional mobility training    PT Goals (Current goals can be found in the Care Plan section)  Acute Rehab PT Goals Patient Stated Goal: to get this pain better PT Goal Formulation: With patient Time For Goal Achievement: 03/17/20 Potential to Achieve Goals: Good    Frequency Min 3X/week   Barriers to discharge        Co-evaluation               AM-PAC PT "6 Clicks" Mobility  Outcome Measure Help needed turning from your back to your side while in a flat bed without using bedrails?: A Little Help needed moving from lying on your back to sitting on the side of a flat bed without using bedrails?: A Little Help needed moving to and from a bed to a chair (including a wheelchair)?: A Lot Help needed standing up from a chair using your arms (e.g., wheelchair or bedside chair)?: A Lot Help needed to walk in hospital room?: A Lot Help needed climbing 3-5 steps with a railing? : Total 6 Click Score: 13    End of Session Equipment Utilized During Treatment: Gait belt Activity Tolerance: Patient limited by pain Patient left: in chair;with call bell/phone within reach;with chair alarm set Nurse Communication: Mobility status;Need for lift equipment (stedy to get back to bed) PT Visit Diagnosis: Pain;Muscle weakness (generalized) (M62.81);Unsteadiness on feet (R26.81);Other abnormalities of gait and  mobility (R26.89) Pain - Right/Left:  (bil) Pain - part of body: Knee;Ankle and joints of foot    Time: 1145-1210 PT Time Calculation (min) (ACUTE ONLY): 25 min   Charges:   PT Evaluation $PT Eval Moderate Complexity: 1 Mod PT Treatments $Therapeutic Activity: 8-22 mins        Marisa Severin, PT, DPT Acute Rehabilitation Services Pager [phone] Office [phone]      Marguarite Arbour A Sabra Heck 03/03/2020, 12:27 PM

## 2020-03-03 NOTE — Progress Notes (Signed)
PROGRESS NOTE    Shelley [name]  BXU:[id number] DOB: 1941-04-05 DOA: 02/28/2020 PCP: Leeanne Rio, MD      Brief Narrative:  Shelley [name] is a 79 y.o. F with severe pHTN, IPF on Ofev and COPD/emphysema on home O2 5-6L, Crohn's disease, hx CVA no residual deficits, hx DVT, HTN, and CAD who presented from [facility] ER after a syncopal episode.    Patient had been sitting on the toilet at home, passed out.  Daughters called EMS.  Initially emergency services noted BP 56/27.  In the ER at [facility], she was still hypotensive, started on Levophed and transferred to [facility].        Assessment & Plan:  Syncope due to pHTN and RV failure  Severe pulmonary hypertension Acute on chronic right heart failure Cardiology consulted.  Patient started on diuretics and hemodynamics improved.    Underwent echo and then [place] on 7/7 that showed improved right sided pressures.  Blood pressure remains soft, but she is asymptomatic.  Cardiology had no further plans for adjustment of torsemide, midodrine, Toprol.  We will tolerate this blood pressure and she will follow up with cardiology as an outpatient. -Consult advanced HF team, appreciate cares -Continue midodrine, torsemide, beta-blocker    Coronary artery disease, secondary prevention Demand ischemia -Continue aspirin, Xarelto for CV disease -Follow up with HF team outpatient  COPD with chronic hypoxic respiratory failure Idiopathic pulmonary fibrosis -Continue Ofev  Peripheral vascular disease History of stroke Statin intolerant.  Cardiology will attempt to get her into clinic for [place].  She is on aspirin and Xarelto for CVA prevention as above.  Crohn's disease No active disease Continue sulfasalazine, folate  SVT This was a new finding 7/8, asymptomatic, hemodynamically stable.  Cardiology have started metoprolol, and she has had no further SVT. -Continue metoprolol  Asymptomatic bacteriuria No treatment  warranted  Gout flare She has new onset of tenderness in both feet 7/8, which she feels is characteristic of her gout flares.    Feet have characteristic redness today, but are improving with prednisone. -Continue prednisone 40 mg once daily for 3 days, finsih tomorrow 7/10 -Continue daily colchicine -Hold allopurinol flaring   Anemia of chronic disease Hgb stable -Continue iron  Pressure injury right and left buttock, stage III, POA Pressure injury right and left heel, stage I, POA  Mood -Continue fluoxetine   Failure to thrive Moderate protein calorie malnutrition As evidenced by moderate loss of subcutaneous muscle mass of back, chronic illness, poor p.o. intake. -Stop Megace -Continue mirtazapine   Leukocytosis She has a chronic cough, no urinary symptoms.  I suspect this leukocytosis is from prednisone. -Repeat CBC tomorrow     Disposition: Status is: Inpatient  Remains inpatient appropriate because:Unsafe d/c plan   Dispo: The patient is from: Home              Anticipated d/c is to: SNF              Anticipated d/c date is: When bed available today or tomorrow              Patient currently is medically stable to d/c.   The patient was admitted and diuresed, with cardiology.  All adjustments of her metoprolol, and diuretics are complete, and she is medically ready for discharge to SNF.  As a result of her severe exacerbation of her chronic disease, the patient is debilitated and unable to walk, she will need rehabilitation to obtain her prior level of  function living at home with family.         MDM: The below labs and imaging reports reviewed and summarized above.  Medication management as above.      DVT prophylaxis: SCDs Start: 02/28/20 1401 rivaroxaban (XARELTO) tablet 2.5 mg  Code Status: DNR Family Communication:     Consultants:   Cardiology  Critical care  Procedures:   7/4 CT head at [facility]  7/4 CT  abdomen and pelvis at [facility] acute abnormalities  7/5 echocardiogram EF 55%, severe RV dysfunction, dilation, elevated right-sided pressures estimated  7/7 right heart cath-cardiac index low normal, filling pressures optimized        Antimicrobials:      Culture data:   Blood culture 7/5 no growth  Urine culture 7/5 pansensitive E. coli          Subjective: No new fever, dysuria, urinary irritative.  No abdominal pain, confusion, vomiting.  She has a chronic cough, no change from baseline.  Her legs feel sore but without swelling.        Objective: Vitals:   03/02/20 1732 03/02/20 2130 03/03/20 0130 03/03/20 0549  BP: (!) 96/36 (!) 95/50 (!) 99/50 (!) 91/54  Pulse: 83 69 65 62  Resp: _0 Temp: 98.6 F (37 C) 98.2 F (36.8 C) 97.8 F (36.6 C) 98.4 F (36.9 C)  TempSrc: Oral     SpO2: 96% 100% 100% 97%  Weight:      Height:        Intake/Output Summary (Last 24 hours) at 03/03/2020 0827 Last data filed at 03/03/2020 0549 Gross per 24 hour  Intake 887 ml  Output 825 ml  Net 62 ml   Filed Weights   02/28/20 1234 02/29/20 0500 03/01/20 0347  Weight: 73.5 kg 74.7 kg 73.7 kg    Examination: General appearance: Elderly adult female, lying in bed, no acute distress, interactive     HEENT:    Skin:  Cardiac: RRR, soft gallop noted, capillary refill normal, JVP not visible, no lower extremity edema, radial pulses normal. Respiratory: Rales at bilateral bases, normal respiratory rate and rhythm, few coughs. Abdomen: Abdomen soft without tenderness palpation or guarding.  No ascites or distention. MSK: Diffuse loss of subcutaneous muscle mass and fat, moderate Neuro: Extraocular movements intact, moves all extremities with generalized weakness but symmetric.  Fluent. Psych: Attention normal, affect normal, judgment insight appear normal         Data Reviewed: I have personally reviewed following labs and imaging  studies:  CBC: Recent Labs  Lab 02/28/20 1653 02/28/20 1653 02/29/20 0240 02/29/20 0240 03/01/20 0321 03/01/20 0321 03/01/20 0905 03/01/20 0906 03/01/20 0907 03/02/20 0246 03/03/20 0500  WBC 9.7  --  10.0  --  10.7*  --   --   --   --  11.6* 13.9*  NEUTROABS 7.6  --   --   --   --   --   --   --   --   --   --   HGB 10.9*   < > 10.7*   < > 10.4*   < > 10.5* 10.5* 9.9* 10.3* 10.1*  HCT 34.3*   < > 33.4*   < > 32.8*   < > 31.0* 31.0* 29.0* 31.6* 31.4*  MCV 104.3*  --  103.1*  --  100.9*  --   --   --   --  102.6* 102.6*  PLT 286  --  273  --  226  --   --   --   --  221 245   < > = values in this interval not displayed.   Basic Metabolic Panel: Recent Labs  Lab 02/29/20 0240 02/29/20 0240 02/29/20 1537 02/29/20 1537 02/29/20 1930 03/01/20 0321 03/01/20 0321 03/01/20 0905 03/01/20 0906 03/01/20 0907 03/02/20 0246 03/03/20 0500  NA 140   < > 136   < >  --  136   < > 138 137 142 136 135  K 3.1*   < > 4.0   < >  --  3.3*   < > 3.9 4.0 3.5 4.1 3.5  CL 106  --  103  --   --  97*  --   --   --   --  102 99  CO2 21*  --  24  --   --  27  --   --   --   --  25 26  GLUCOSE 94  --  136*  --   --  98  --   --   --   --  94 141*  BUN 18  --  16  --   --  15  --   --   --   --  12 19  CREATININE 0.91  --  0.98  --   --  0.86  --   --   --   --  0.78 0.78  CALCIUM 8.3*  --  7.9*  --   --  8.3*  --   --   --   --  8.6* 8.9  MG 1.2*  --   --   --  2.0  --   --   --   --   --   --   --    < > = values in this interval not displayed.   GFR: Estimated Creatinine Clearance: 56.4 mL/min (by C-G formula based on SCr of 0.78 mg/dL). Liver Function Tests: Recent Labs  Lab 02/28/20 1653  AST 53*  ALT 27  ALKPHOS 64  BILITOT 0.8  PROT 5.3*  ALBUMIN 2.1*   No results for input(s): LIPASE, AMYLASE in the last 168 hours. No results for input(s): AMMONIA in the last 168 hours. Coagulation Profile: No results for input(s): INR, PROTIME in the last 168 hours. Cardiac Enzymes: No  results for input(s): CKTOTAL, CKMB, CKMBINDEX, TROPONINI in the last 168 hours. BNP (last 3 results) No results for input(s): PROBNP in the last 8760 hours. HbA1C: No results for input(s): HGBA1C in the last 72 hours. CBG: Recent Labs  Lab 02/28/20 1613 02/28/20 2302 02/29/20 1149  GLUCAP 57* 76 108*   Lipid Profile: No results for input(s): CHOL, HDL, LDLCALC, TRIG, CHOLHDL, LDLDIRECT in the last 72 hours. Thyroid Function Tests: No results for input(s): TSH, T4TOTAL, FREET4, T3FREE, THYROIDAB in the last 72 hours. Anemia Panel: Recent Labs    02/29/20 0900  TIBC 182*  IRON 49   Urine analysis:    Component Value Date/Time   COLORURINE AMBER BIOCHEMICALS MAY BE AFFECTED BY COLOR (A) 10/09/2010 1125   APPEARANCEUR CLEAR 10/09/2010 1125   LABSPEC 1.028 10/09/2010 1125   PHURINE 6.5 10/09/2010 1125   HGBUR NEGATIVE 10/09/2010 1125   BILIRUBINUR NEGATIVE 10/09/2010 1125   KETONESUR NEGATIVE 10/09/2010 1125   PROTEINUR NEGATIVE 10/09/2010 1125   UROBILINOGEN 0.2 10/09/2010 1125   NITRITE NEGATIVE 10/09/2010 1125   LEUKOCYTESUR NEGATIVE 10/09/2010 1125   Sepsis Labs: _0 (procalcitonin:4,lacticacidven:4)  )  Recent Results (from the past 240 hour(s))  MRSA PCR Screening     Status: None   Collection Time: 02/28/20 12:49 PM   Specimen: Nasal Mucosa; Nasopharyngeal  Result Value Ref Range Status   MRSA by PCR NEGATIVE NEGATIVE Final    Comment:        The GeneXpert MRSA Assay (FDA approved for NASAL specimens only), is one component of a comprehensive MRSA colonization surveillance program. It is not intended to diagnose MRSA infection nor to guide or monitor treatment for MRSA infections. Performed at [facility], [place] [address], [place], [place] [address]   Culture, blood (routine x 2)     Status: None (Preliminary result)   Collection Time: 02/28/20  4:54 PM   Specimen: BLOOD  Result Value Ref Range Status   Specimen Description BLOOD RIGHT  ANTECUBITAL  Final   Special Requests   Final    BOTTLES DRAWN AEROBIC AND ANAEROBIC Blood Culture adequate volume   Culture   Final    NO GROWTH 3 DAYS Performed at [facility], [place] [address], [place], [place] [address]    Report Status PENDING  Incomplete  Culture, blood (routine x 2)     Status: None (Preliminary result)   Collection Time: 02/28/20  5:00 PM   Specimen: BLOOD RIGHT HAND  Result Value Ref Range Status   Specimen Description BLOOD RIGHT HAND  Final   Special Requests   Final    BOTTLES DRAWN AEROBIC ONLY Blood Culture results may not be optimal due to an inadequate volume of blood received in culture bottles   Culture   Final    NO GROWTH 3 DAYS Performed at [facility], [place] [address], [place], [place] [address]    Report Status PENDING  Incomplete  Urine culture     Status: Abnormal   Collection Time: 02/28/20  6:56 PM   Specimen: Urine, Random  Result Value Ref Range Status   Specimen Description URINE, RANDOM  Final   Special Requests   Final    NONE Performed at [facility], [place] [address], [place], [place] [address]    Culture >=100,000 COLONIES/mL ESCHERICHIA COLI (A)  Final   Report Status 03/02/2020 FINAL  Final   Organism ID, Bacteria ESCHERICHIA COLI (A)  Final      Susceptibility   Escherichia coli - MIC*    AMPICILLIN <=2 SENSITIVE Sensitive     CEFAZOLIN <=4 SENSITIVE Sensitive     CEFTRIAXONE <=0.25 SENSITIVE Sensitive     CIPROFLOXACIN <=0.25 SENSITIVE Sensitive     GENTAMICIN <=1 SENSITIVE Sensitive     IMIPENEM <=0.25 SENSITIVE Sensitive     NITROFURANTOIN <=16 SENSITIVE Sensitive     TRIMETH/SULFA <=20 SENSITIVE Sensitive     AMPICILLIN/SULBACTAM <=2 SENSITIVE Sensitive     PIP/TAZO <=4 SENSITIVE Sensitive     * >=100,000 COLONIES/mL ESCHERICHIA COLI  Culture, respiratory (tracheal aspirate)     Status: None   Collection Time: 02/28/20  6:56 PM   Specimen: Sputum; Respiratory  Result Value Ref Range Status    Specimen Description SPU  Final   Special Requests NONE  Final   Gram Stain   Final    FEW WBC PRESENT, PREDOMINANTLY PMN FEW GRAM POSITIVE COCCI RARE GRAM VARIABLE ROD    Culture   Final    RARE Consistent with normal respiratory flora. Performed at [facility], [place] [address] [place], [place] [address]    Report  Status 03/02/2020 FINAL  Final         Radiology Studies: CARDIAC CATHETERIZATION  Result Date: 03/01/2020 1. Optimized filling pressures. 2. Low cardiac index at 2.0 3. Moderate pulmonary arterial hypertension with PVR 9.1 WU. Can stop IV Lasix.  Will plan to start Tyvaso, will likely need to be as outpatient.        Scheduled Meds: . aspirin EC  81 mg Oral Daily  . Chlorhexidine Gluconate Cloth  6 each Topical Q0600  . colchicine  0.6 mg Oral Daily  . feeding supplement (ENSURE ENLIVE)  237 mL Oral BID BM  . ferrous sulfate  325 mg Oral Daily  . FLUoxetine  40 mg Oral Daily  . folic acid  1 mg Oral Daily  . mouth rinse  15 mL Mouth Rinse BID  . metoprolol succinate  25 mg Oral Daily  . midodrine  7.5 mg Oral TID WC  . mirtazapine  7.5 mg Oral QHS  . multivitamin with minerals  1 tablet Oral Daily  . Nintedanib  1 capsule Oral BID  . potassium chloride  20 mEq Oral BID  . predniSONE  40 mg Oral Q breakfast  . rivaroxaban  2.5 mg Oral BID  . sodium chloride flush  3 mL Intravenous Q12H  . sulfaSALAzine  500 mg Oral BID  . torsemide  80 mg Oral Daily   Continuous Infusions: . sodium chloride 10 mL/hr at 03/01/20 2050  . sodium chloride 250 mL (03/01/20 0540)     LOS: 4 days    Time spent: 25 minutes    Edwin Dada, MD [facility] 03/03/2020, 8:27 AM     Please page though [place] or Epic secure chat:  For [employer], Adult nurse

## 2020-03-03 NOTE — Telephone Encounter (Signed)
Called and left message for patient with number from [facility].

## 2020-03-03 NOTE — Plan of Care (Signed)
  Problem: Activity: Goal: Risk for activity intolerance will decrease Outcome: Progressing

## 2020-03-03 NOTE — Progress Notes (Addendum)
Patient ID: Shelley [name], female   DOB: 11/10/1940, 79 y.o.   MRN: [id number]     Advanced Heart Failure Rounding Note  PCP-Cardiologist: Rozann Lesches, MD   Subjective:    Denies SOB. Thinks prednisone is helping.   RHC Procedural Findings: Hemodynamics (mmHg) RA mean 4 RV 66/5 PA 63/27, mean 40 PCWP mean 6 Oxygen saturations: SVC 50% PA 54% AO 96% Cardiac Output (Fick) 3.71  Cardiac Index (Fick) 2.04 PVR 9.1 WU  Objective:   Weight Range: 73.7 kg Body mass index is 27.04 kg/m.   Vital Signs:   Temp:  [97.4 F (36.3 C)-98.6 F (37 C)] 98.4 F (36.9 C) (07/09 0549) Pulse Rate:  [62-140] 62 (07/09 0549) Resp:  [15-28] 15 (07/09 0549) BP: (77-103)/(36-58) 91/54 (07/09 0549) SpO2:  [94 %-100 %] 97 % (07/09 0549) Last BM Date: 03/01/20  Weight change: Filed Weights   02/28/20 1234 02/29/20 0500 03/01/20 0347  Weight: 73.5 kg 74.7 kg 73.7 kg    Intake/Output:   Intake/Output Summary (Last 24 hours) at 03/03/2020 0748 Last data filed at 03/03/2020 0549 Gross per 24 hour  Intake 897 ml  Output 825 ml  Net 72 ml      Physical Exam   General:  No resp difficulty HEENT: normal Neck: supple. no JVD. Carotids 2+ bilat; no bruits. No lymphadenopathy or thryomegaly appreciated. Cor: PMI nondisplaced. Regular rate & rhythm. No rubs, gallops or murmurs. Lungs: clear on 5 liters [place].  Abdomen: soft, nontender, nondistended. No hepatosplenomegaly. No bruits or masses. Good bowel sounds. Extremities: no cyanosis, clubbing, rash, edema Neuro: alert & orientedx3, cranial nerves grossly intact. moves all 4 extremities w/o difficulty. Affect pleasant .    Telemetry   SR 60s no further  SVT    Labs    CBC Recent Labs    03/02/20 0246 03/03/20 0500  WBC 11.6* 13.9*  HGB 10.3* 10.1*  HCT 31.6* 31.4*  MCV 102.6* 102.6*  PLT 221 742   Basic Metabolic Panel Recent Labs    02/29/20 1930 03/01/20 0321 03/02/20 0246 03/03/20 0500  NA  --    < > 136 135    K  --    < > 4.1 3.5  CL  --    < > 102 99  CO2  --    < > 25 26  GLUCOSE  --    < > 94 141*  BUN  --    < > 12 19  CREATININE  --    < > 0.78 0.78  CALCIUM  --    < > 8.6* 8.9  MG 2.0  --   --   --    < > = values in this interval not displayed.   Liver Function Tests No results for input(s): AST, ALT, ALKPHOS, BILITOT, PROT, ALBUMIN in the last 72 hours. No results for input(s): LIPASE, AMYLASE in the last 72 hours. Cardiac Enzymes No results for input(s): CKTOTAL, CKMB, CKMBINDEX, TROPONINI in the last 72 hours.  BNP: BNP (last 3 results) Recent Labs    02/28/20 1653  BNP 2,646.6*    ProBNP (last 3 results) No results for input(s): PROBNP in the last 8760 hours.   D-Dimer No results for input(s): DDIMER in the last 72 hours. Hemoglobin A1C No results for input(s): HGBA1C in the last 72 hours. Fasting Lipid Panel No results for input(s): CHOL, HDL, LDLCALC, TRIG, CHOLHDL, LDLDIRECT in the last 72 hours. Thyroid Function Tests No results for input(s): TSH, T4TOTAL,  T3FREE, THYROIDAB in the last 72 hours.  Invalid input(s): FREET3  Other results:   Imaging    No results found.   Medications:     Scheduled Medications: . aspirin EC  81 mg Oral Daily  . Chlorhexidine Gluconate Cloth  6 each Topical Q0600  . colchicine  0.6 mg Oral Daily  . feeding supplement (ENSURE ENLIVE)  237 mL Oral BID BM  . ferrous sulfate  325 mg Oral Daily  . FLUoxetine  40 mg Oral Daily  . folic acid  1 mg Oral Daily  . mouth rinse  15 mL Mouth Rinse BID  . metoprolol succinate  25 mg Oral Daily  . midodrine  7.5 mg Oral TID WC  . mirtazapine  7.5 mg Oral QHS  . multivitamin with minerals  1 tablet Oral Daily  . Nintedanib  1 capsule Oral BID  . predniSONE  40 mg Oral Q breakfast  . rivaroxaban  2.5 mg Oral BID  . sodium chloride flush  3 mL Intravenous Q12H  . sulfaSALAzine  500 mg Oral BID  . torsemide  80 mg Oral Daily    Infusions: . sodium chloride 10 mL/hr at  03/01/20 2050  . sodium chloride 250 mL (03/01/20 0540)    PRN Medications: docusate sodium, HYDROcodone-acetaminophen, polyethylene glycol, Zinc Oxide   Assessment/Plan   1. Syncope: Concerned that this could have been due to severe RV failure and pulmonary hypertension.  She has mildly hypotensive here, now weaned off norepinephrine to midodrine.  Echo shows severely dilated RV with severe RV dysfunction and PASP 110 mmHg. Volume status now improved. No significant arrhythmias noted so far.  - Will follow on telemetry, but suspect that event was less likely arrhythmic and more likely a vagal event while using the bathroom in setting of severe RV failure/pulmonary hypertension.  - Continue midodrine given RV failure.  2. Elevated Troponin: Noted at [facility].  She does have history of CAD (h/o BMS to RCA in 2004; LHC in 3/19 showed 70% in-stent restenosis in the RCA) but suspect this was demand ischemia in setting of RV failure/volume overload. - No chest pain.   - Continue ASA 81.  - She has extensive vascular disease. Based on COMPASS study, I have her on rivaroxaban 2.5 mg bid.  -She has not been able to tolerate statins due to myalgias. She has an appointment soon inlipid clinic to get started with Repatha.  3. RV failure with severe pulmonary hypertension: CTA chest in 1/19 showed chronic fibrotic changes in the lungs but not emphysema, IPF diagnosed by Dr. [place] Bells. However, PFTs in 3/19 were suggestive of moderate-severe obstruction consistent with COPD. RHC (3/19) showed moderate pulmonary arterial hypertension. V/Q scan showed no evidence for chronic pulmonary embolus. Echo this admission with EF 55%, moderate LVH, D-shaped septum, RV severely enlarged with severely decreased systolic function, PASP 474 mmHg, moderate-severe TR, IVC dilated suggesting RA pressure 15 mmHg.  She is thought to have group 3 PH due to COPD and interstitial lung disease (IPF), she has OSA as well.   We had been planning repeat RHC with eye toward starting Tyvaso based on INCREASE trial in ILD.   BNP was high and IVC was dilated on echo with IV septum left-shifted. She has been diuresed with good response.  RHC today with optimized filling pressures after diuresis and moderate pulmonary arterial hypertension with high PVR. - Volume status stable. Continue torsemide 80 mg daily. Renal function stable.  - Continue midodrine long-term with  severe RV dysfunction, continue 7.5 mg tid.   -Working  on adding Tyvaso  based on INCREASE trial, PCWP now optimized.  Suspect this will have to be done as outpatient but will check with pharmacy.  4. Pulmonary fibrosis: IPF. She is now on Ofev.  5. Carotid stenosis: Followed by [facility]. 6. PAD: Peripheral arterial dopplers (3/19) showed occluded left SFA.She has bilateral leg pain, but pain is not typical for claudication.  - Sees Dr. Oneida Alar, he has so far recommended medical management.  - She has quit smoking.  7. COPD: She has now quit smoking.  8. Gout: Severe gout recently, seems to be flaring now in setting of diuresis.  - Continue colchicine, will need allopurinol eventually.  - Started prednisone  7/8  9. Weight loss: Poor appetite/early satiety, ?due to RV failure at least in part.  - Ensure bid.  10. UTI: Has E coli in urine, per primary team. 11. SVT- In and out 7/8 withtoday.  - No further events. Continue toprol XL 25 mg daily.   We will set up HF follow up.   Length of Stay: 4  Amy Clegg, NP  03/03/2020, 7:48 AM  Advanced Heart Failure Team Pager [phone] (M-F; 7a - 4p)  Please contact [facility] for night-coverage after hours (4p -7a ) and weekends on [email or web address]  Patient seen with NP, agree with the above note.   She feels better today, decreased gouty pain. No dyspnea.   General: NAD Neck: No JVD, no thyromegaly or thyroid nodule.  Lungs: Clear to auscultation bilaterally with normal respiratory effort. CV: Nondisplaced  PMI.  Heart regular S1/S2, no S3/S4, no murmur.  No peripheral edema.   Abdomen: Soft, nontender, no hepatosplenomegaly, no distention.  Skin: Intact without lesions or rashes.  Neurologic: Alert and oriented x 3.  Psych: Normal affect. Extremities: No clubbing or cyanosis.  HEENT: Normal.   Would continue midodrine + torsemide 80 mg daily for RV failure.  Working on Special educational needs teacher for her as outpatient.   No further SVT on Toprol XL.    We will arrange followup.  Will see again on Monday if she remains in hospital.   Loralie Champagne 03/03/2020 8:14 AM

## 2020-03-03 NOTE — TOC Initial Note (Addendum)
Transition of Care [facility]) - Initial/Assessment Note    Patient Details  Name: Shelley [name] MRN: [id number] Date of Birth: 10/26/1940  Transition of Care [facility]) CM/SW Contact:    Sable Feil, LCSW Phone Number: 03/03/2020, 3:17 PM  Clinical Narrative:  CSW visited room and talked with patient regarding her d/c disposition and recommendation of ST rehab. [name] was sitting up in a chair and was alert, oriented, pleasant and agreeable to talking with CSW. Patient reported that she lives with her daughter Shelley [name] and her grandson, and she can be contacted if needed. When asked, patient responded that she has not been vaccinated, wears glasses all of the time and does not use hearing aides.    When asked, [name] responded that she has been to rehab before at [facility] facility, spent 4 weeks there and discharged from SNF 2 weeks ago and has been getting therapy at home. Patient is agreeable to returning to a facility for ST rehab and gave her preference as returning to [facility]. Patient recounted that prior to coming to the hospital, she passed out at home while on the potty. She shared with CSW that she has gout and it is very painful, and the medications prescribed for it have not been working to help with the pain.   When asked, patient responded that she has a potty by her bed, and uses an walker at home and a bath chair.    3:53 pm: Contacted [facility] and talked with admissions director Deliah Boston. regarding patient requesting their facility for ST rehab. Mardene Celeste also informed that patient was at their facility recently for rehab. Mardene Celeste advised CSW that she will review, check her insurance and SNF days and respond in Epic.    4:23 pm: Received call from Mardene Celeste, admissions Mudlogger at [facility] regarding [employer]. Mardene Celeste reported that she checked into patient's insurance and she is in the same benefit period and has  used 21 days already, so he co-pay will be higher - $178 per day. Mardene Celeste added that she does not have listed that patient has Medicaid.   4:32 pm: Visited with patient and shared information with her regarding the co-pays. Patient advised that the co-pays would be the same at any facility as she is in the same benefit period with Medicare. Patient indicated that she plans to have her daughter apply for Medicaid for her and this was discussed, and patient informed that once her daughter applies on her behalf, the Medicaid application may not be processed immediately - to assist her in going to rehab now. Patient asked that her daughter be contacted.  4:45 pm: Attempted to reach daughter Shelley [name] and message left.   5:27 pm: Talked with daughter Shelley [name] and provided her with co-pay information. She plans to talk with her sister about this. CSW provided her with weekend CSW's phone number to call and give their decision regarding going ahead with SNF and she was also has this CSW's phone number.   CSW will continue to follow.  Expected Discharge Plan: [place] Barriers to Discharge: Insurance Authorization, No SNF bed (Initiating SNF search 7/9)   Patient Goals and CMS Choice Patient states their goals for this hospitalization and ongoing recovery are:: Patient agreeable to [place] rehab and preference is to return to [facility] SNF CMS Medicare.gov Compare Post Acute Care list provided to:: Other (Comment Required) (Medicare.gov list not provided as patient stated SNF placement. Talked with daughter  by phone and informed her of Medicare.gov if [facility] unable to accept patient) Choice offered to / list presented to : Patient, Adult Children  Expected Discharge Plan and Services Expected Discharge Plan: [place] In-house Referral: Clinical Social Work                                            Prior Living Arrangements/Services   Lives  with:: Adult Children (Daughter Shelley [name]) Patient language and need for interpreter reviewed:: No Do you feel safe going back to the place where you live?: No (Patient expressed agreement with going to [place] rehab)   Patient expressed agreement with going to [place] rehab as the gout in her feet is very painful and makes walking difficult  Need for Family Participation in Patient Care: Yes (Comment) Care giver support system in place?: Yes (comment)   Criminal Activity/Legal Involvement Pertinent to Current Situation/Hospitalization: No - Comment as needed  Activities of Daily Living Home Assistive Devices/Equipment: Walker (specify type) ADL Screening (condition at time of admission) Patient's cognitive ability adequate to safely complete daily activities?: Yes Is the patient deaf or have difficulty hearing?: No Does the patient have difficulty seeing, even when wearing glasses/contacts?: No Does the patient have difficulty concentrating, remembering, or making decisions?: Yes Patient able to express need for assistance with ADLs?: Yes Does the patient have difficulty dressing or bathing?: No Independently performs ADLs?: No Communication: Independent Dressing (OT): Independent Grooming: Independent Feeding: Independent Bathing: Independent Toileting: Independent In/Out Bed: Independent Walks in Home: Needs assistance Is this a change from baseline?: Pre-admission baseline Does the patient have difficulty walking or climbing stairs?: Yes Weakness of Legs: Both Weakness of Arms/Hands: None  Permission Sought/Granted Permission sought to share information with : Family Supports Permission granted to share information with : Yes, Verbal Permission Granted  Share Information with NAME: Zonnique Norkus     Permission granted to share info w Relationship: Daughter  Permission granted to share info w Contact Information: [phone]  Emotional Assessment Appearance:: Appears stated  age Attitude/Demeanor/Rapport: Engaged Affect (typically observed): Pleasant Orientation: : Oriented to Self, Oriented to Place, Oriented to  Time, Oriented to Situation Alcohol / Substance Use: Tobacco Use, Alcohol Use, Illicit Drugs (Per H&P patient reported that she quit smoking 2 years ago and does not drink alcohol or use illicit drugs) Psych Involvement: No (comment)  Admission diagnosis:  Hypotension [I95.9] Patient Active Problem List   Diagnosis Date Noted  . Pressure injury of skin 02/28/2020  . Hypotension 02/28/2020  . Pulmonary embolus ([place]) 02/28/2020  . Crohn's disease of colon without complication ([place]) 54/00/8676  . OSA on CPAP 09/21/2018  . Abnormal thyroid biopsy 04/09/2018  . Multinodular goiter 03/19/2018  . Pulmonary artery hypertension ([place]) 02/12/2018  . Chronic respiratory failure with hypoxia ([place]) 02/12/2018  . IPF (idiopathic pulmonary fibrosis) ([place]) 02/12/2018  . Snoring 02/12/2018  . Thyroid nodule 02/12/2018  . Dry mouth 02/12/2018  . Malnutrition of moderate degree ([place]) 02/12/2018  . Carotid stenosis 06/17/2014  . CORONARY ATHEROSCLEROSIS, NATIVE VESSEL 06/07/2009  . COPD without exacerbation ([place]) 05/02/2009  . Mixed hyperlipidemia 10/31/2008  . TOBACCO ABUSE 10/31/2008  . Bilateral carotid artery occlusion 10/31/2008  . TIA 10/27/2007   PCP:  Leeanne Rio, MD Pharmacy:   [place], [place] [address] [place] [address] Phone:  [phone] Fax: [phone]  [place], [place] [address] Phone: [phone] Fax: [phone]   Social Determinants of Health (SDOH) Interventions  No SDOH interventions requested or needed at this time  Readmission Risk Interventions No flowsheet data found.

## 2020-03-04 LAB — CBC
HCT: 31.9 % — ABNORMAL LOW (ref 36.0–46.0)
Hemoglobin: 10.2 g/dL — ABNORMAL LOW (ref 12.0–15.0)
MCH: 32.6 pg (ref 26.0–34.0)
MCHC: 32 g/dL (ref 30.0–36.0)
MCV: 101.9 fL — ABNORMAL HIGH (ref 80.0–100.0)
Platelets: 275 10*3/uL (ref 150–400)
RBC: 3.13 MIL/uL — ABNORMAL LOW (ref 3.87–5.11)
RDW: 17.5 % — ABNORMAL HIGH (ref 11.5–15.5)
WBC: 11.7 10*3/uL — ABNORMAL HIGH (ref 4.0–10.5)
nRBC: 0 % (ref 0.0–0.2)

## 2020-03-04 LAB — CULTURE, BLOOD (ROUTINE X 2)
Culture: NO GROWTH
Culture: NO GROWTH
Special Requests: ADEQUATE

## 2020-03-04 LAB — BASIC METABOLIC PANEL
Anion gap: 14 (ref 5–15)
BUN: 26 mg/dL — ABNORMAL HIGH (ref 8–23)
CO2: 25 mmol/L (ref 22–32)
Calcium: 8.9 mg/dL (ref 8.9–10.3)
Chloride: 100 mmol/L (ref 98–111)
Creatinine, Ser: 0.87 mg/dL (ref 0.44–1.00)
GFR calc Af Amer: >60 mL/min (ref >60)
GFR calc non Af Amer: >60 mL/min (ref >60)
Glucose, Bld: 85 mg/dL (ref 70–99)
Potassium: 3.9 mmol/L (ref 3.5–5.1)
Sodium: 139 mmol/L (ref 135–145)

## 2020-03-04 MED ORDER — PREDNISONE 20 MG PO TABS
40.0000 mg | ORAL_TABLET | Freq: Every day | ORAL | Status: AC
  Administered 2020-03-05 – 2020-03-06 (×2): 40 mg via ORAL
  Filled 2020-03-04 (×2): qty 2

## 2020-03-04 NOTE — Progress Notes (Signed)
PROGRESS NOTE    Shelley [name]  PYK:[id number] DOB: Mar 23, 1941 DOA: 02/28/2020 PCP: Leeanne Rio, MD      Brief Narrative:  Shelley [name] is a 79 y.o. F with severe pHTN, IPF on Ofev and COPD/emphysema on home O2 5-6L, Crohn's disease, hx CVA no residual deficits, hx DVT, HTN, and CAD who presented from [facility] ER after a syncopal episode.    Patient had been sitting on the toilet at home, passed out.  Daughters called EMS.  Initially emergency services noted BP 56/27.  In the ER at [facility], she was still hypotensive, started on Levophed and transferred to [facility].        Assessment & Plan:  Syncope due to pulmonary hypertension and right ventricular failure Severe pulmonary hypertension Acute on chronic right heart failure Cardiology consulted.  Patient started on diuretics and hemodynamics improved.    Underwent echo and then [place] on 7/7 that showed improved right sided pressures.  Her diuretics and beta-blocker and midodrine were titrated by CHF team.  Her blood pressure appears to have stabilized.  She will follow up with cardiology as an outpatient    -Consult advanced HF team, appreciate cares -Continue midodrine, torsemide, beta-blocker    Coronary artery disease, secondary prevention Demand ischemia -Continue aspirin, Xarelto for CV disease -Follow up with HF team outpatient  COPD with chronic hypoxic respiratory failure Idiopathic pulmonary fibrosis Asymptomatic -Continue Ofev  Peripheral vascular disease History of stroke Statin intolerant.  Cardiology will attempt to get her into clinic for [place].  She is on aspirin and Xarelto for CVA prevention as above.  Crohn's disease No active disease -Continue sulfasalazine, folate  SVT This was a new finding 7/8, asymptomatic, hemodynamically stable.  Cardiology have started metoprolol. No further runs of SVT -Continue metoprolol  Asymptomatic bacteriuria Remains asymptomatic, no treatment  warranted    Gout flare She had new onset of tenderness in both feet 7/8, which she feels is characteristic of her gout flares.  Feet red and shiny, mostly in the midfoot.  Some initial improvement, but still painful. -Continue prednisone, I will extend the course since she is still having pain -Continue daily colchicine -Hold allopurinol until this flare is resolved   Anemia of chronic disease Hemoglobin unchanged, no clinical bleeding -Continue iron  Pressure injury right and left buttock, stage III, POA Pressure injury right and left heel, stage I, POA  Mood disorder -Continue fluoxetine   Failure to thrive Moderate protein calorie malnutrition As evidenced by moderate loss of subcutaneous muscle mass of back, chronic illness, poor p.o. intake. -Stop Megace -Continue mirtazapine       Disposition: Status is: Inpatient  Remains inpatient appropriate because:Unsafe d/c plan   Dispo: The patient is from: Home              Anticipated d/c is to: SNF              Anticipated d/c date is: When bed available today or tomorrow              Patient currently is medically stable to d/c.   The patient was admitted and diuresed, with cardiology.  All adjustments of her metoprolol, and diuretics are complete, and she is medically ready for discharge to SNF.  As a result of her severe exacerbation of her chronic disease, the patient is debilitated and unable to walk, she will need rehabilitation to obtain her prior level of function living at home with family.  MDM: The below labs and imaging reports reviewed and summarized above.  Medication management as above.      DVT prophylaxis: SCDs Start: 02/28/20 1401 rivaroxaban (XARELTO) tablet 2.5 mg  Code Status: DNR Family Communication: Called to daughter, no answer, left VM    Consultants:   Cardiology  Critical care  Procedures:   7/4 CT head at [facility]  7/4 CT abdomen and  pelvis at [facility] acute abnormalities  7/5 echocardiogram EF 55%, severe RV dysfunction, dilation, elevated right-sided pressures estimated  7/7 right heart cath-cardiac index low normal, filling pressures optimized        Antimicrobials:    None  Culture data:   Blood culture 7/5 no growth  Urine culture 7/5 pansensitive E. Coli  Respiratory culture 7/5 normal respiratory flora          Subjective: No new fever.  No urinary symptoms.  She is constipated, but has no chest pain, syncope, confusion, dizziness, trouble breathing, leg swelling.        Objective: Vitals:   03/04/20 0128 03/04/20 0200 03/04/20 0328 03/04/20 0806  BP: (!) 100/55 95/60 106/60 116/76  Pulse:    66  Resp: _0 Temp: 98.3 F (36.8 C) 98.2 F (36.8 C) 98.5 F (36.9 C) 98 F (36.7 C)  TempSrc:    Oral  SpO2: 98% 98% 100% 100%  Weight:      Height:        Intake/Output Summary (Last 24 hours) at 03/04/2020 1328 Last data filed at 03/04/2020 0900 Gross per 24 hour  Intake 240 ml  Output 1300 ml  Net -1060 ml   Filed Weights   02/28/20 1234 02/29/20 0500 03/01/20 0347  Weight: 73.5 kg 74.7 kg 73.7 kg    Examination: General appearance: Frail elderly adult female, lying in bed, no acute distress, interactive     HEENT:    Skin: Skin on the top of the feet is red and shiny.  Otherwise no suspicious rashes or lesions, skin normal for age. Cardiac: RRR, soft cough noted, JVP not visible to me, no appreciable lower extremity edema, radial pulses normal Respiratory: Rales at bilateral bases, consistent with IPF, normal respiratory rate and rhythm, no wheezing. Abdomen: Abdomen soft without tenderness palpation or guarding, no suprapubic tenderness, no ascites or distention. MSK:  Neuro: Extraocular movements intact, moves all extremities with generalized weakness, but symmetric strength.  Fluent speech. Psych: Attention normal, affect normal, judgment insight appear  normal            Data Reviewed: I have personally reviewed following labs and imaging studies:  CBC: Recent Labs  Lab 02/28/20 1653 02/28/20 1653 02/29/20 0240 02/29/20 0240 03/01/20 0321 03/01/20 0905 03/01/20 0906 03/01/20 0907 03/02/20 0246 03/03/20 0500 03/04/20 0431  WBC 9.7   < > 10.0  --  10.7*  --   --   --  11.6* 13.9* 11.7*  NEUTROABS 7.6  --   --   --   --   --   --   --   --   --   --   HGB 10.9*   < > 10.7*   < > 10.4*   < > 10.5* 9.9* 10.3* 10.1* 10.2*  HCT 34.3*   < > 33.4*   < > 32.8*   < > 31.0* 29.0* 31.6* 31.4* 31.9*  MCV 104.3*   < > 103.1*  --  100.9*  --   --   --  102.6* 102.6*  101.9*  PLT 286   < > 273  --  226  --   --   --  221 245 275   < > = values in this interval not displayed.   Basic Metabolic Panel: Recent Labs  Lab 02/29/20 0240 02/29/20 0240 02/29/20 1537 02/29/20 1537 02/29/20 1930 03/01/20 0321 03/01/20 0905 03/01/20 0906 03/01/20 0907 03/02/20 0246 03/03/20 0500 03/04/20 0431  NA 140   < > 136   < >  --  136   < > 137 142 136 135 139  K 3.1*   < > 4.0   < >  --  3.3*   < > 4.0 3.5 4.1 3.5 3.9  CL 106   < > 103  --   --  97*  --   --   --  102 99 100  CO2 21*   < > 24  --   --  27  --   --   --  _0 GLUCOSE 94   < > 136*  --   --  98  --   --   --  94 141* 85  BUN 18   < > 16  --   --  15  --   --   --  12 19 26*  CREATININE 0.91   < > 0.98  --   --  0.86  --   --   --  0.78 0.78 0.87  CALCIUM 8.3*   < > 7.9*  --   --  8.3*  --   --   --  8.6* 8.9 8.9  MG 1.2*  --   --   --  2.0  --   --   --   --   --   --   --    < > = values in this interval not displayed.   GFR: Estimated Creatinine Clearance: 51.9 mL/min (by C-G formula based on SCr of 0.87 mg/dL). Liver Function Tests: Recent Labs  Lab 02/28/20 1653  AST 53*  ALT 27  ALKPHOS 64  BILITOT 0.8  PROT 5.3*  ALBUMIN 2.1*   No results for input(s): LIPASE, AMYLASE in the last 168 hours. No results for input(s): AMMONIA in the last 168  hours. Coagulation Profile: No results for input(s): INR, PROTIME in the last 168 hours. Cardiac Enzymes: No results for input(s): CKTOTAL, CKMB, CKMBINDEX, TROPONINI in the last 168 hours. BNP (last 3 results) No results for input(s): PROBNP in the last 8760 hours. HbA1C: No results for input(s): HGBA1C in the last 72 hours. CBG: Recent Labs  Lab 02/28/20 1613 02/28/20 2302 02/29/20 1149  GLUCAP 57* 76 108*   Lipid Profile: No results for input(s): CHOL, HDL, LDLCALC, TRIG, CHOLHDL, LDLDIRECT in the last 72 hours. Thyroid Function Tests: No results for input(s): TSH, T4TOTAL, FREET4, T3FREE, THYROIDAB in the last 72 hours. Anemia Panel: No results for input(s): VITAMINB12, FOLATE, FERRITIN, TIBC, IRON, RETICCTPCT in the last 72 hours. Urine analysis:    Component Value Date/Time   COLORURINE AMBER BIOCHEMICALS MAY BE AFFECTED BY COLOR (A) 10/09/2010 1125   APPEARANCEUR CLEAR 10/09/2010 1125   LABSPEC 1.028 10/09/2010 1125   PHURINE 6.5 10/09/2010 1125   HGBUR NEGATIVE 10/09/2010 1125   BILIRUBINUR NEGATIVE 10/09/2010 1125   KETONESUR NEGATIVE 10/09/2010 1125   PROTEINUR NEGATIVE 10/09/2010 1125   UROBILINOGEN 0.2 10/09/2010 1125   NITRITE NEGATIVE 10/09/2010 1125   LEUKOCYTESUR NEGATIVE 10/09/2010 1125   Sepsis  Labs: _0 (procalcitonin:4,lacticacidven:4)  ) Recent Results (from the past 240 hour(s))  MRSA PCR Screening     Status: None   Collection Time: 02/28/20 12:49 PM   Specimen: Nasal Mucosa; Nasopharyngeal  Result Value Ref Range Status   MRSA by PCR NEGATIVE NEGATIVE Final    Comment:        The GeneXpert MRSA Assay (FDA approved for NASAL specimens only), is one component of a comprehensive MRSA colonization surveillance program. It is not intended to diagnose MRSA infection nor to guide or monitor treatment for MRSA infections. Performed at [facility], [place] [address] [place], [place] [address]   Culture, blood (routine x 2)      Status: None (Preliminary result)   Collection Time: 02/28/20  4:54 PM   Specimen: BLOOD  Result Value Ref Range Status   Specimen Description BLOOD RIGHT ANTECUBITAL  Final   Special Requests   Final    BOTTLES DRAWN AEROBIC AND ANAEROBIC Blood Culture adequate volume   Culture   Final    NO GROWTH 4 DAYS Performed at [facility], [place] [address] [place], [place] [address]    Report Status PENDING  Incomplete  Culture, blood (routine x 2)     Status: None (Preliminary result)   Collection Time: 02/28/20  5:00 PM   Specimen: BLOOD RIGHT HAND  Result Value Ref Range Status   Specimen Description BLOOD RIGHT HAND  Final   Special Requests   Final    BOTTLES DRAWN AEROBIC ONLY Blood Culture results may not be optimal due to an inadequate volume of blood received in culture bottles   Culture   Final    NO GROWTH 4 DAYS Performed at [facility], [place] [address], [place], [place] [address]    Report Status PENDING  Incomplete  Urine culture     Status: Abnormal   Collection Time: 02/28/20  6:56 PM   Specimen: Urine, Random  Result Value Ref Range Status   Specimen Description URINE, RANDOM  Final   Special Requests   Final    NONE Performed at [facility], [place] [address], [place], [place] [address]    Culture >=100,000 COLONIES/mL ESCHERICHIA COLI (A)  Final   Report Status 03/02/2020 FINAL  Final   Organism ID, Bacteria ESCHERICHIA COLI (A)  Final      Susceptibility   Escherichia coli - MIC*    AMPICILLIN <=2 SENSITIVE Sensitive     CEFAZOLIN <=4 SENSITIVE Sensitive     CEFTRIAXONE <=0.25 SENSITIVE Sensitive     CIPROFLOXACIN <=0.25 SENSITIVE Sensitive     GENTAMICIN <=1 SENSITIVE Sensitive     IMIPENEM <=0.25 SENSITIVE Sensitive     NITROFURANTOIN <=16 SENSITIVE Sensitive     TRIMETH/SULFA <=20 SENSITIVE Sensitive     AMPICILLIN/SULBACTAM <=2 SENSITIVE Sensitive     PIP/TAZO <=4 SENSITIVE Sensitive     * >=100,000 COLONIES/mL ESCHERICHIA COLI   Culture, respiratory (tracheal aspirate)     Status: None   Collection Time: 02/28/20  6:56 PM   Specimen: Sputum; Respiratory  Result Value Ref Range Status   Specimen Description SPU  Final   Special Requests NONE  Final   Gram Stain   Final    FEW WBC PRESENT, PREDOMINANTLY PMN FEW GRAM POSITIVE COCCI RARE GRAM VARIABLE ROD    Culture   Final    RARE Consistent with normal respiratory flora. Performed at [facility], [place] [address] [place], [place] [address]  Report Status 03/02/2020 FINAL  Final         Radiology Studies: No results found.      Scheduled Meds:  aspirin EC  81 mg Oral Daily   Chlorhexidine Gluconate Cloth  6 each Topical Q0600   colchicine  0.6 mg Oral Daily   feeding supplement (ENSURE ENLIVE)  237 mL Oral BID BM   ferrous sulfate  325 mg Oral Daily   FLUoxetine  40 mg Oral Daily   folic acid  1 mg Oral Daily   mouth rinse  15 mL Mouth Rinse BID   metoprolol succinate  25 mg Oral Daily   midodrine  7.5 mg Oral TID WC   mirtazapine  7.5 mg Oral QHS   multivitamin with minerals  1 tablet Oral Daily   Nintedanib  1 capsule Oral BID   potassium chloride  20 mEq Oral BID   [START ON 03/05/2020] predniSONE  40 mg Oral Q breakfast   rivaroxaban  2.5 mg Oral BID   sodium chloride flush  3 mL Intravenous Q12H   sulfaSALAzine  500 mg Oral BID   torsemide  80 mg Oral Daily   Continuous Infusions:  sodium chloride 10 mL/hr at 03/01/20 2050   sodium chloride 250 mL (03/01/20 0540)     LOS: 5 days    Time spent: 25 minutes    Edwin Dada, MD [facility] 03/04/2020, 1:28 PM     Please page though [place] or Epic secure chat:  For [employer], Adult nurse

## 2020-03-04 NOTE — Progress Notes (Signed)
   03/03/20 2328  Assess: MEWS Score  Temp 98.4 F (36.9 C)  BP 104/60  ECG Heart Rate (!) 128 (Keaghan Staton rn aware)  Resp 18  Level of Consciousness Alert  SpO2 98 %  O2 Device Room Air  Assess: MEWS Score  MEWS Temp 0  MEWS Systolic 0  MEWS Pulse 2  MEWS RR 0  MEWS LOC 0  MEWS Score 2  MEWS Score Color Yellow  Assess: if the MEWS score is Yellow or Red  Were vital signs taken at a resting state? Yes  Focused Assessment Documented focused assessment  Early Detection of Sepsis Score *See Row Information* Low  MEWS guidelines implemented *See Row Information* Yes  Treat  MEWS Interventions Administered scheduled meds/treatments  Take Vital Signs  Increase Vital Sign Frequency  Yellow: Q 2hr X 2 then Q 4hr X 2, if remains yellow, continue Q 4hrs  Escalate  MEWS: Escalate Yellow: discuss with charge nurse/RN and consider discussing with provider and RRT  Notify: Charge Nurse/RN  Name of Charge Nurse/RN Notified Nikki RN  Date Charge Nurse/RN Notified 03/03/20  Time Charge Nurse/RN Notified 2330  Notify: Provider  Provider Name/Title N/A  Notify: Rapid Response  Name of Rapid Response RN Notified N/A  Document  Patient Outcome Stabilized after interventions  Progress note created (see row info) Yes

## 2020-03-05 MED ORDER — POLYETHYLENE GLYCOL 3350 17 G PO PACK
17.0000 g | PACK | Freq: Once | ORAL | Status: AC
  Administered 2020-03-05: 17 g via ORAL
  Filled 2020-03-05: qty 1

## 2020-03-05 MED ORDER — SENNOSIDES-DOCUSATE SODIUM 8.6-50 MG PO TABS
1.0000 | ORAL_TABLET | Freq: Once | ORAL | Status: AC
  Administered 2020-03-05: 1 via ORAL
  Filled 2020-03-05: qty 1

## 2020-03-05 NOTE — Plan of Care (Signed)
Patient is awaiting SNF. Per MD note, is medically stable for discharge to SNF.

## 2020-03-05 NOTE — Progress Notes (Signed)
PROGRESS NOTE    Shelley [name]  ASN:[id number] DOB: [birth year] DOA: 02/28/2020 PCP: Leeanne Rio, MD      Brief Narrative:  [name] is a 79 y.o. F with severe pHTN, IPF on Ofev and COPD/emphysema on home O2 5-6L, Crohn's disease, hx CVA no residual deficits, hx DVT, HTN, and CAD who presented from [facility] ER after a syncopal episode.    Patient had been sitting on the toilet at home, passed out.  Daughters called EMS.  Initially emergency services noted BP 56/27.  In the ER at [facility], she was still hypotensive, started on Levophed and transferred to [facility].        Assessment & Plan:  Syncope due to pulmonary hypertension and right ventricular failure Severe pulmonary hypertension Acute on chronic right heart failure Cardiology consulted.  Patient started on diuretics and hemodynamics improved.    Underwent echo and then [place] on 7/7 that showed improved right sided pressures.  Her diuretics and beta-blocker and midodrine were titrated by CHF team.  Her blood pressure appears to have stabilized.  She will follow up with cardiology as an outpatient for further adjustments.  -Continue midodrine, torsemide, beta-blocker    Coronary artery disease, secondary prevention Demand ischemia -Continue aspirin, and Xarelto for CV disease -Follow up with HF team outpatient  COPD with chronic hypoxic respiratory failure Idiopathic pulmonary fibrosis Asymptomatic -Continue Ofev  Peripheral vascular disease History of stroke Statin intolerant.  Cardiology will attempt to get her into clinic for [place].  She is on aspirin and Xarelto for CVA prevention as above.  Crohn's disease No active disease -Continue sulfasalazine, folate  SVT This was a new finding 7/8, asymptomatic, hemodynamically stable.  Cardiology have started metoprolol. No further SVT -Stop telemetry -Continue metoprolol  Asymptomatic bacteriuria Remains asymptomatic, no treatment warranted     Gout flare She had new onset of tenderness in both feet 7/8, which she feels is characteristic of her gout flares.  Feet red and shiny, mostly in the midfoot.  This is improving, redness is gone, her movement is better. -Continue prednisone -Continue daily colchicine -Hold allopurinol until this flare is resolved   Anemia of chronic disease Hemoglobin unchanged, no clinical bleeding -Continue iron  Pressure injury right and left buttock, stage III, POA Pressure injury right and left heel, stage I, POA  Mood disorder -Continue fluoxetine   Failure to thrive Moderate protein calorie malnutrition As evidenced by moderate loss of subcutaneous muscle mass of back, chronic illness, poor p.o. intake. -Stop Megace -Continue mirtazapine       Disposition: Status is: Inpatient  Remains inpatient appropriate because:Unsafe d/c plan   Dispo: The patient is from: Home              Anticipated d/c is to: SNF              Anticipated d/c date is: When bed available today or tomorrow              Patient currently is medically stable to d/c.   The patient was admitted and diuresed, with cardiology.  All adjustments of her metoprolol, and diuretics are complete, and she is medically ready for discharge to SNF.  As a result of her severe exacerbation of her chronic disease, the patient is debilitated and unable to walk, she will need rehabilitation to obtain her prior level of function living at home with family.         MDM: The below labs and imaging  reports reviewed and summarized above.  Medication management as above.      DVT prophylaxis: SCDs Start: 02/28/20 1401 rivaroxaban (XARELTO) tablet 2.5 mg  Code Status: DNR Family Communication: Daughter [name] by phone, all questions answered    Consultants:   Cardiology  Critical care  Procedures:   7/4 CT head at [facility]  7/4 CT abdomen and pelvis at [facility] acute  abnormalities  7/5 echocardiogram EF 55%, severe RV dysfunction, dilation, elevated right-sided pressures estimated  7/7 right heart cath-cardiac index low normal, filling pressures optimized        Antimicrobials:    None  Culture data:   Blood culture 7/5 no growth  Urine culture 7/5 pansensitive E. Coli  Respiratory culture 7/5 normal respiratory flora          Subjective: Pain in the feet is better, she is able to move them all the morning, but they are still sore.  Constipated still.  No syncope, chest pain, confusion, dizziness, trouble breathing, leg swelling.        Objective: Vitals:   03/04/20 2032 03/05/20 0427 03/05/20 0500 03/05/20 0929  BP: (!) 107/53 (!) 107/57  (!) 95/49  Pulse: (!) 58 (!) 57  (!) 55  Resp: _0 Temp: 97.6 F (36.4 C) (!) 96.6 F (35.9 C)  97.8 F (36.6 C)  TempSrc: Oral Axillary  Oral  SpO2: 96% 100%  93%  Weight: 76.2 kg  76.2 kg   Height:        Intake/Output Summary (Last 24 hours) at 03/05/2020 1558 Last data filed at 03/05/2020 1410 Gross per 24 hour  Intake 680 ml  Output 3500 ml  Net -2820 ml   Filed Weights   03/01/20 0347 03/04/20 2032 03/05/20 0500  Weight: 73.7 kg 76.2 kg 76.2 kg    Examination: General appearance: Elderly female, lying in bed, no acute distress, sleeping but easily arousable, interactive     HEENT:    Skin: Skin on the top of the feet is now normal, no suspicious rashes lesions elsewhere on the skin of the arms, legs, face, neck, upper chest. Cardiac: RRR, soft gallop noted, and JVP not visible due to body habitus, no lower extremity edema, radial pulses normal. Respiratory: Bilateral bases have fine rales, normal respiratory rate and rhythm, no wheezing. Abdomen: Abdomen soft without tenderness palpation or guarding. MSK:  Neuro:    Psych: Attention normal, affect normal, judgment insight appear normal.             Data Reviewed: I have personally reviewed  following labs and imaging studies:  CBC: Recent Labs  Lab 02/28/20 1653 02/28/20 1653 02/29/20 0240 02/29/20 0240 03/01/20 0321 03/01/20 0905 03/01/20 0906 03/01/20 0907 03/02/20 0246 03/03/20 0500 03/04/20 0431  WBC 9.7   < > 10.0  --  10.7*  --   --   --  11.6* 13.9* 11.7*  NEUTROABS 7.6  --   --   --   --   --   --   --   --   --   --   HGB 10.9*   < > 10.7*   < > 10.4*   < > 10.5* 9.9* 10.3* 10.1* 10.2*  HCT 34.3*   < > 33.4*   < > 32.8*   < > 31.0* 29.0* 31.6* 31.4* 31.9*  MCV 104.3*   < > 103.1*  --  100.9*  --   --   --  102.6* 102.6* 101.9*  PLT 286   < > 273  --  226  --   --   --  221 245 275   < > = values in this interval not displayed.   Basic Metabolic Panel: Recent Labs  Lab 02/29/20 0240 02/29/20 0240 02/29/20 1537 02/29/20 1537 02/29/20 1930 03/01/20 0321 03/01/20 0905 03/01/20 0906 03/01/20 0907 03/02/20 0246 03/03/20 0500 03/04/20 0431  NA 140   < > 136   < >  --  136   < > 137 142 136 135 139  K 3.1*   < > 4.0   < >  --  3.3*   < > 4.0 3.5 4.1 3.5 3.9  CL 106   < > 103  --   --  97*  --   --   --  102 99 100  CO2 21*   < > 24  --   --  27  --   --   --  _0 GLUCOSE 94   < > 136*  --   --  98  --   --   --  94 141* 85  BUN 18   < > 16  --   --  15  --   --   --  12 19 26*  CREATININE 0.91   < > 0.98  --   --  0.86  --   --   --  0.78 0.78 0.87  CALCIUM 8.3*   < > 7.9*  --   --  8.3*  --   --   --  8.6* 8.9 8.9  MG 1.2*  --   --   --  2.0  --   --   --   --   --   --   --    < > = values in this interval not displayed.   GFR: Estimated Creatinine Clearance: 52.7 mL/min (by C-G formula based on SCr of 0.87 mg/dL). Liver Function Tests: Recent Labs  Lab 02/28/20 1653  AST 53*  ALT 27  ALKPHOS 64  BILITOT 0.8  PROT 5.3*  ALBUMIN 2.1*   No results for input(s): LIPASE, AMYLASE in the last 168 hours. No results for input(s): AMMONIA in the last 168 hours. Coagulation Profile: No results for input(s): INR, PROTIME in the last 168  hours. Cardiac Enzymes: No results for input(s): CKTOTAL, CKMB, CKMBINDEX, TROPONINI in the last 168 hours. BNP (last 3 results) No results for input(s): PROBNP in the last 8760 hours. HbA1C: No results for input(s): HGBA1C in the last 72 hours. CBG: Recent Labs  Lab 02/28/20 1613 02/28/20 2302 02/29/20 1149  GLUCAP 57* 76 108*   Lipid Profile: No results for input(s): CHOL, HDL, LDLCALC, TRIG, CHOLHDL, LDLDIRECT in the last 72 hours. Thyroid Function Tests: No results for input(s): TSH, T4TOTAL, FREET4, T3FREE, THYROIDAB in the last 72 hours. Anemia Panel: No results for input(s): VITAMINB12, FOLATE, FERRITIN, TIBC, IRON, RETICCTPCT in the last 72 hours. Urine analysis:    Component Value Date/Time   COLORURINE AMBER BIOCHEMICALS MAY BE AFFECTED BY COLOR (A) 10/09/2010 1125   APPEARANCEUR CLEAR 10/09/2010 1125   LABSPEC 1.028 10/09/2010 1125   PHURINE 6.5 10/09/2010 1125   HGBUR NEGATIVE 10/09/2010 1125   BILIRUBINUR NEGATIVE 10/09/2010 1125   KETONESUR NEGATIVE 10/09/2010 1125   PROTEINUR NEGATIVE 10/09/2010 1125   UROBILINOGEN 0.2 10/09/2010 1125   NITRITE NEGATIVE 10/09/2010 1125   LEUKOCYTESUR NEGATIVE 10/09/2010 1125   Sepsis Labs: _1 (procalcitonin:4,lacticacidven:4)  )  Recent Results (from the past 240 hour(s))  MRSA PCR Screening     Status: None   Collection Time: 02/28/20 12:49 PM   Specimen: Nasal Mucosa; Nasopharyngeal  Result Value Ref Range Status   MRSA by PCR NEGATIVE NEGATIVE Final    Comment:        The GeneXpert MRSA Assay (FDA approved for NASAL specimens only), is one component of a comprehensive MRSA colonization surveillance program. It is not intended to diagnose MRSA infection nor to guide or monitor treatment for MRSA infections. Performed at [facility], [place] [address], [place], [place] [address]   Culture, blood (routine x 2)     Status: None   Collection Time: 02/28/20  4:54 PM   Specimen: BLOOD  Result Value Ref  Range Status   Specimen Description BLOOD RIGHT ANTECUBITAL  Final   Special Requests   Final    BOTTLES DRAWN AEROBIC AND ANAEROBIC Blood Culture adequate volume   Culture   Final    NO GROWTH 5 DAYS Performed at [facility], [place] [address] [place], [place] [address]    Report Status 03/04/2020 FINAL  Final  Culture, blood (routine x 2)     Status: None   Collection Time: 02/28/20  5:00 PM   Specimen: BLOOD RIGHT HAND  Result Value Ref Range Status   Specimen Description BLOOD RIGHT HAND  Final   Special Requests   Final    BOTTLES DRAWN AEROBIC ONLY Blood Culture results may not be optimal due to an inadequate volume of blood received in culture bottles   Culture   Final    NO GROWTH 5 DAYS Performed at [facility], [place] [address], [place], [place] [address]    Report Status 03/04/2020 FINAL  Final  Urine culture     Status: Abnormal   Collection Time: 02/28/20  6:56 PM   Specimen: Urine, Random  Result Value Ref Range Status   Specimen Description URINE, RANDOM  Final   Special Requests   Final    NONE Performed at [facility], [place] [address] [place], [place] [address]    Culture >=100,000 COLONIES/mL ESCHERICHIA COLI (A)  Final   Report Status 03/02/2020 FINAL  Final   Organism ID, Bacteria ESCHERICHIA COLI (A)  Final      Susceptibility   Escherichia coli - MIC*    AMPICILLIN <=2 SENSITIVE Sensitive     CEFAZOLIN <=4 SENSITIVE Sensitive     CEFTRIAXONE <=0.25 SENSITIVE Sensitive     CIPROFLOXACIN <=0.25 SENSITIVE Sensitive     GENTAMICIN <=1 SENSITIVE Sensitive     IMIPENEM <=0.25 SENSITIVE Sensitive     NITROFURANTOIN <=16 SENSITIVE Sensitive     TRIMETH/SULFA <=20 SENSITIVE Sensitive     AMPICILLIN/SULBACTAM <=2 SENSITIVE Sensitive     PIP/TAZO <=4 SENSITIVE Sensitive     * >=100,000 COLONIES/mL ESCHERICHIA COLI  Culture, respiratory (tracheal aspirate)     Status: None   Collection Time: 02/28/20  6:56 PM   Specimen: Sputum; Respiratory   Result Value Ref Range Status   Specimen Description SPU  Final   Special Requests NONE  Final   Gram Stain   Final    FEW WBC PRESENT, PREDOMINANTLY PMN FEW GRAM POSITIVE COCCI RARE GRAM VARIABLE ROD    Culture   Final    RARE Consistent with normal respiratory flora. Performed at [facility], [place] [address], [place], [place] [address]    Report Status 03/02/2020  FINAL  Final         Radiology Studies: No results found.      Scheduled Meds:  aspirin EC  81 mg Oral Daily   Chlorhexidine Gluconate Cloth  6 each Topical Q0600   colchicine  0.6 mg Oral Daily   feeding supplement (ENSURE ENLIVE)  237 mL Oral BID BM   ferrous sulfate  325 mg Oral Daily   FLUoxetine  40 mg Oral Daily   folic acid  1 mg Oral Daily   mouth rinse  15 mL Mouth Rinse BID   metoprolol succinate  25 mg Oral Daily   midodrine  7.5 mg Oral TID WC   mirtazapine  7.5 mg Oral QHS   multivitamin with minerals  1 tablet Oral Daily   Nintedanib  1 capsule Oral BID   potassium chloride  20 mEq Oral BID   predniSONE  40 mg Oral Q breakfast   rivaroxaban  2.5 mg Oral BID   sodium chloride flush  3 mL Intravenous Q12H   sulfaSALAzine  500 mg Oral BID   torsemide  80 mg Oral Daily   Continuous Infusions:  sodium chloride 10 mL/hr at 03/01/20 2050   sodium chloride 250 mL (03/01/20 0540)     LOS: 6 days    Time spent: 25 minutes    Edwin Dada, MD [facility] 03/05/2020, 3:58 PM     Please page though [place] or Epic secure chat:  For [employer], Adult nurse

## 2020-03-06 LAB — SARS CORONAVIRUS 2 BY RT PCR (HOSPITAL ORDER, PERFORMED IN ~~LOC~~ HOSPITAL LAB): SARS Coronavirus 2: NEGATIVE

## 2020-03-06 MED ORDER — SENNOSIDES-DOCUSATE SODIUM 8.6-50 MG PO TABS
2.0000 | ORAL_TABLET | Freq: Once | ORAL | Status: AC
  Administered 2020-03-06: 2 via ORAL
  Filled 2020-03-06: qty 2

## 2020-03-06 MED ORDER — POLYETHYLENE GLYCOL 3350 17 G PO PACK
17.0000 g | PACK | Freq: Once | ORAL | Status: AC
  Administered 2020-03-06: 17 g via ORAL
  Filled 2020-03-06: qty 1

## 2020-03-06 NOTE — Progress Notes (Signed)
PROGRESS NOTE    Shelley [name]  HBZ:[id number] DOB: [birth year] DOA: 02/28/2020 PCP: Leeanne Rio, MD      Brief Narrative:  Mrs. Shrewsbury is a 79 y.o. F with severe pHTN, IPF on Ofev and COPD/emphysema on home O2 5-6L, Crohn's disease, hx CVA no residual deficits, hx DVT, HTN, and CAD who presented from [facility] ER after a syncopal episode.    Patient had been sitting on the toilet at home, passed out.  Daughters called EMS.  Initially emergency services noted BP 56/27.  In the ER at [facility], she was still hypotensive, started on Levophed and transferred to [facility].        Assessment & Plan:  Syncope due to pulmonary hypertension and right ventricular failure Severe pulmonary hypertension Acute on chronic right heart failure Cardiology consulted.  Patient started on diuretics and hemodynamics improved.    Underwent echo and then [place] on 7/7 that showed improved right sided pressures.  Her diuretics and beta-blocker and midodrine were titrated by CHF team.  Her blood pressure appears to have stabilized.  She will follow up with cardiology as an outpatient for further adjustments.  No new symptoms -Continue midodrine, torsemide, beta-blocker    Coronary artery disease, secondary prevention Demand ischemia -Continue aspirin and Xarelto for CV disease -Follow up with HF team outpatient  COPD with chronic hypoxic respiratory failure Idiopathic pulmonary fibrosis Asymptomatic -Continue Ofev  Peripheral vascular disease History of stroke Statin intolerant.  Cardiology will attempt to get her into clinic for [place].  She is on aspirin and Xarelto for CVA prevention as above.  Crohn's disease No active disease Continue sulfasalazine and folate  SVT This was a new finding 7/8, asymptomatic, hemodynamically stable.  Cardiology have started metoprolol. No further SVT -Stop telemetry -Continue metoprolol  Asymptomatic bacteriuria Remains asymptomatic, no  treatment warranted    Gout flare She had new onset of tenderness in both feet 7/8, which she feels is characteristic of her gout flares.  Feet red and shiny, mostly in the midfoot.  This continues to improve.  Redness is gone, her pain is mostly resolved. -Continue daily colchicine -Resume allopurinol later this week if flare continues to resolve    Anemia of chronic disease Hemoglobin unchanged, no clinical bleeding -Continue iron  Pressure injury right and left buttock, stage III, POA Pressure injury right and left heel, stage I, POA  Mood disorder -Continue fluoxetine   Failure to thrive Moderate protein calorie malnutrition As evidenced by moderate loss of subcutaneous muscle mass of back, chronic illness, poor p.o. intake. -Stop Megace -Continue mirtazapine       Disposition: Status is: Inpatient  Remains inpatient appropriate because:Unsafe d/c plan   Dispo: The patient is from: Home              Anticipated d/c is to: SNF              Anticipated d/c date is: When bed available today or tomorrow              Patient currently is medically stable to d/c.   The patient was admitted and diuresed, with cardiology.  All adjustments of her metoprolol, and diuretics are complete, and she is medically ready for discharge to SNF.  As a result of her severe exacerbation of her chronic disease, the patient is debilitated and unable to walk, she will need rehabilitation to obtain her prior level of function living at home with family.  MDM: The below labs and imaging reports reviewed and summarized above.  Medication management as above.     DVT prophylaxis: SCDs Start: 02/28/20 1401 rivaroxaban (XARELTO) tablet 2.5 mg  Code Status: DNR Family Communication: Daughter [name] by phone, all questions answered    Consultants:   Cardiology  Critical care  Procedures:   7/4 CT head at [facility]  7/4 CT abdomen and pelvis at  [facility] acute abnormalities  7/5 echocardiogram EF 55%, severe RV dysfunction, dilation, elevated right-sided pressures estimated  7/7 right heart cath-cardiac index low normal, filling pressures optimized        Antimicrobials:    None  Culture data:   Blood culture 7/5 no growth  Urine culture 7/5 pansensitive E. Coli  Respiratory culture 7/5 normal respiratory flora          Subjective: Foot pain is better.  No new syncope, discomfort in the chest, shortness of breath, swelling.    Objective: Vitals:   03/05/20 1806 03/05/20 2031 03/06/20 0550 03/06/20 0900  BP: (!) 100/52 108/63 109/61 (!) 97/53  Pulse: (!) 59 (!) 59 (!) 58 60  Resp: _0 Temp: 98 F (36.7 C) 97.9 F (36.6 C) (!) 97.5 F (36.4 C)   TempSrc: Oral Oral Oral   SpO2: 96% 100% 100% 100%  Weight:   75 kg   Height:        Intake/Output Summary (Last 24 hours) at 03/06/2020 1123 Last data filed at 03/06/2020 0900 Gross per 24 hour  Intake 440 ml  Output 4325 ml  Net -3885 ml   Filed Weights   03/04/20 2032 03/05/20 0500 03/06/20 0550  Weight: 76.2 kg 76.2 kg 75 kg    Examination: General appearance: Frail elderly female, lying in bed, no acute distress.  Interactive.     HEENT:    Skin: Feet look better, redness and shininess resolved. Cardiac: RRR, soft gallop, no lower extremity edema Respiratory: Normal respiratory rate and rhythm, fine rales in the bases. Abdomen:   MSK:  Neuro: Extraocular movements intact, moves upper extremities with generalized weakness but normal coordination.  Speech fluent. Psych: Attention normal, affect pleasant, judgment insight normal               Data Reviewed: I have personally reviewed following labs and imaging studies:  CBC: Recent Labs  Lab 02/28/20 1653 02/28/20 1653 02/29/20 0240 02/29/20 0240 03/01/20 0321 03/01/20 0905 03/01/20 0906 03/01/20 0907 03/02/20 0246 03/03/20 0500 03/04/20 0431  WBC 9.7    < > 10.0  --  10.7*  --   --   --  11.6* 13.9* 11.7*  NEUTROABS 7.6  --   --   --   --   --   --   --   --   --   --   HGB 10.9*   < > 10.7*   < > 10.4*   < > 10.5* 9.9* 10.3* 10.1* 10.2*  HCT 34.3*   < > 33.4*   < > 32.8*   < > 31.0* 29.0* 31.6* 31.4* 31.9*  MCV 104.3*   < > 103.1*  --  100.9*  --   --   --  102.6* 102.6* 101.9*  PLT 286   < > 273  --  226  --   --   --  221 245 275   < > = values in this interval not displayed.   Basic Metabolic Panel: Recent Labs  Lab 02/29/20 0240 02/29/20  0240 02/29/20 1537 02/29/20 1537 02/29/20 1930 03/01/20 0321 03/01/20 0905 03/01/20 0906 03/01/20 0907 03/02/20 0246 03/03/20 0500 03/04/20 0431  NA 140   < > 136   < >  --  136   < > 137 142 136 135 139  K 3.1*   < > 4.0   < >  --  3.3*   < > 4.0 3.5 4.1 3.5 3.9  CL 106   < > 103  --   --  97*  --   --   --  102 99 100  CO2 21*   < > 24  --   --  27  --   --   --  _0 GLUCOSE 94   < > 136*  --   --  98  --   --   --  94 141* 85  BUN 18   < > 16  --   --  15  --   --   --  12 19 26*  CREATININE 0.91   < > 0.98  --   --  0.86  --   --   --  0.78 0.78 0.87  CALCIUM 8.3*   < > 7.9*  --   --  8.3*  --   --   --  8.6* 8.9 8.9  MG 1.2*  --   --   --  2.0  --   --   --   --   --   --   --    < > = values in this interval not displayed.   GFR: Estimated Creatinine Clearance: 52.3 mL/min (by C-G formula based on SCr of 0.87 mg/dL). Liver Function Tests: Recent Labs  Lab 02/28/20 1653  AST 53*  ALT 27  ALKPHOS 64  BILITOT 0.8  PROT 5.3*  ALBUMIN 2.1*   No results for input(s): LIPASE, AMYLASE in the last 168 hours. No results for input(s): AMMONIA in the last 168 hours. Coagulation Profile: No results for input(s): INR, PROTIME in the last 168 hours. Cardiac Enzymes: No results for input(s): CKTOTAL, CKMB, CKMBINDEX, TROPONINI in the last 168 hours. BNP (last 3 results) No results for input(s): PROBNP in the last 8760 hours. HbA1C: No results for input(s): HGBA1C in the  last 72 hours. CBG: Recent Labs  Lab 02/28/20 1613 02/28/20 2302 02/29/20 1149  GLUCAP 57* 76 108*   Lipid Profile: No results for input(s): CHOL, HDL, LDLCALC, TRIG, CHOLHDL, LDLDIRECT in the last 72 hours. Thyroid Function Tests: No results for input(s): TSH, T4TOTAL, FREET4, T3FREE, THYROIDAB in the last 72 hours. Anemia Panel: No results for input(s): VITAMINB12, FOLATE, FERRITIN, TIBC, IRON, RETICCTPCT in the last 72 hours. Urine analysis:    Component Value Date/Time   COLORURINE AMBER BIOCHEMICALS MAY BE AFFECTED BY COLOR (A) 10/09/2010 1125   APPEARANCEUR CLEAR 10/09/2010 1125   LABSPEC 1.028 10/09/2010 1125   PHURINE 6.5 10/09/2010 1125   HGBUR NEGATIVE 10/09/2010 1125   BILIRUBINUR NEGATIVE 10/09/2010 1125   KETONESUR NEGATIVE 10/09/2010 1125   PROTEINUR NEGATIVE 10/09/2010 1125   UROBILINOGEN 0.2 10/09/2010 1125   NITRITE NEGATIVE 10/09/2010 1125   LEUKOCYTESUR NEGATIVE 10/09/2010 1125   Sepsis Labs: _1 (procalcitonin:4,lacticacidven:4)  ) Recent Results (from the past 240 hour(s))  MRSA PCR Screening     Status: None   Collection Time: 02/28/20 12:49 PM   Specimen: Nasal Mucosa; Nasopharyngeal  Result Value Ref Range Status   MRSA by PCR NEGATIVE NEGATIVE Final  Comment:        The GeneXpert MRSA Assay (FDA approved for NASAL specimens only), is one component of a comprehensive MRSA colonization surveillance program. It is not intended to diagnose MRSA infection nor to guide or monitor treatment for MRSA infections. Performed at [facility], [place] [address], [place], [place] [address]   Culture, blood (routine x 2)     Status: None   Collection Time: 02/28/20  4:54 PM   Specimen: BLOOD  Result Value Ref Range Status   Specimen Description BLOOD RIGHT ANTECUBITAL  Final   Special Requests   Final    BOTTLES DRAWN AEROBIC AND ANAEROBIC Blood Culture adequate volume   Culture   Final    NO GROWTH 5 DAYS Performed at [facility], [place] [address], [place], [place] [address]    Report Status 03/04/2020 FINAL  Final  Culture, blood (routine x 2)     Status: None   Collection Time: 02/28/20  5:00 PM   Specimen: BLOOD RIGHT HAND  Result Value Ref Range Status   Specimen Description BLOOD RIGHT HAND  Final   Special Requests   Final    BOTTLES DRAWN AEROBIC ONLY Blood Culture results may not be optimal due to an inadequate volume of blood received in culture bottles   Culture   Final    NO GROWTH 5 DAYS Performed at [facility], [place] [address], [place], [place] [address]    Report Status 03/04/2020 FINAL  Final  Urine culture     Status: Abnormal   Collection Time: 02/28/20  6:56 PM   Specimen: Urine, Random  Result Value Ref Range Status   Specimen Description URINE, RANDOM  Final   Special Requests   Final    NONE Performed at [facility], [place] [address] [place], [place] [address]    Culture >=100,000 COLONIES/mL ESCHERICHIA COLI (A)  Final   Report Status 03/02/2020 FINAL  Final   Organism ID, Bacteria ESCHERICHIA COLI (A)  Final      Susceptibility   Escherichia coli - MIC*    AMPICILLIN <=2 SENSITIVE Sensitive     CEFAZOLIN <=4 SENSITIVE Sensitive     CEFTRIAXONE <=0.25 SENSITIVE Sensitive     CIPROFLOXACIN <=0.25 SENSITIVE Sensitive     GENTAMICIN <=1 SENSITIVE Sensitive     IMIPENEM <=0.25 SENSITIVE Sensitive     NITROFURANTOIN <=16 SENSITIVE Sensitive     TRIMETH/SULFA <=20 SENSITIVE Sensitive     AMPICILLIN/SULBACTAM <=2 SENSITIVE Sensitive     PIP/TAZO <=4 SENSITIVE Sensitive     * >=100,000 COLONIES/mL ESCHERICHIA COLI  Culture, respiratory (tracheal aspirate)     Status: None   Collection Time: 02/28/20  6:56 PM   Specimen: Sputum; Respiratory  Result Value Ref Range Status   Specimen Description SPU  Final   Special Requests NONE  Final   Gram Stain   Final    FEW WBC PRESENT, PREDOMINANTLY PMN FEW GRAM POSITIVE COCCI RARE GRAM VARIABLE ROD    Culture   Final     RARE Consistent with normal respiratory flora. Performed at [facility], [place] [address] [place], [place] [address]    Report Status 03/02/2020 FINAL  Final         Radiology Studies: No results found.      Scheduled Meds: . aspirin EC  81 mg Oral Daily  . Chlorhexidine Gluconate Cloth  6 each Topical Q0600  . colchicine  0.6 mg  Oral Daily  . feeding supplement (ENSURE ENLIVE)  237 mL Oral BID BM  . ferrous sulfate  325 mg Oral Daily  . FLUoxetine  40 mg Oral Daily  . folic acid  1 mg Oral Daily  . mouth rinse  15 mL Mouth Rinse BID  . metoprolol succinate  25 mg Oral Daily  . midodrine  7.5 mg Oral TID WC  . mirtazapine  7.5 mg Oral QHS  . multivitamin with minerals  1 tablet Oral Daily  . Nintedanib  1 capsule Oral BID  . potassium chloride  20 mEq Oral BID  . rivaroxaban  2.5 mg Oral BID  . sodium chloride flush  3 mL Intravenous Q12H  . sulfaSALAzine  500 mg Oral BID  . torsemide  80 mg Oral Daily   Continuous Infusions: . sodium chloride 10 mL/hr at 03/01/20 2050  . sodium chloride 250 mL (03/01/20 0540)     LOS: 7 days    Time spent: 25 minutes    Edwin Dada, MD [facility] 03/06/2020, 11:23 AM     Please page though [place] or Epic secure chat:  For [employer], Adult nurse

## 2020-03-06 NOTE — Progress Notes (Signed)
Occupational Therapy Treatment Patient Details Name: Shelley [name] MRN: [id number] DOB: 09-29-1940 Today's Date: 03/06/2020    History of present illness 26 yof with hypertension, HLD, HFpEF, CAD s/p stent placement, IPF with pulm hypertension, Chrohn's disease, and depression who was transferred from [facility] ED for syncope>hypotension requiring pressor support.    OT comments  Pt progressing with OT goals and motivated to participate with therapy. Pt demonstrated ability to stand with Min A during session, but could only stand < 10 seconds for grooming tasks at sink. Pt limited more today by decreased cardiopulmonary tolerance rather than pain, though B LE pain still present. SpO2 WFL on 5 L O2 despite dyspnea. Pt demonstrated ability to problem solve energy conservation strategies to implement during ADLs (seated and standing) and will continue to benefit from skilled OT services to maximize overall independence.   Follow Up Recommendations  SNF    Equipment Recommendations  None recommended by OT    Recommendations for Other Services      Precautions / Restrictions Precautions Precautions: Fall Precaution Comments: Hx of gout per report that is not managed by medications in bil ankles/knees. Restrictions Weight Bearing Restrictions: No       Mobility Bed Mobility               General bed mobility comments: up in recliner on entry  Transfers Overall transfer level: Needs assistance Equipment used: None (standing at sink from recliner) Transfers: Sit to/from Stand Sit to Stand: Min assist         General transfer comment: Min A for sit to stand transfers (3-4) at sink for grooming task. cues to bring B feet back for improved stability     Balance Overall balance assessment: Needs assistance Sitting-balance support: Feet supported;No upper extremity supported Sitting balance-Leahy Scale: Good     Standing balance support: Bilateral upper extremity  supported Standing balance-Leahy Scale: Poor Standing balance comment: reliant on sink for support during grooming task in standing                           ADL either performed or assessed with clinical judgement   ADL Overall ADL's : Needs assistance/impaired     Grooming: Min guard;Standing;Oral care;Wash/dry face Grooming Details (indicate cue type and reason): Pt with decreased endurance for completion of full grooming task in standing. Pt independently problem solved to place toothpaste on toothbrush and brush teeth sitting. Min guard in standing for finishing brushing teeth and rinsing at sink. only able to stand < 10 seconds at a time                               General ADL Comments: Pt limited more today by decreased cardiopulmonary tolerance for standing activities. Continues to have limitations due to gout pain as well     Vision   Vision Assessment?: No apparent visual deficits   Perception     Praxis      Cognition Arousal/Alertness: Awake/alert Behavior During Therapy: WFL for tasks assessed/performed Overall Cognitive Status: Within Functional Limits for tasks assessed                                          Exercises     Shoulder Instructions       General Comments  Pt with SOB 3/4 DOE, SpO2 96% on 5 L O2, 92 HR during activity     Pertinent Vitals/ Pain       Pain Assessment: Faces Faces Pain Scale: Hurts little more Pain Location: B feet, gout Pain Descriptors / Indicators: Sore;Constant Pain Intervention(s): Limited activity within patient's tolerance;Monitored during session  Home Living                                          Prior Functioning/Environment              Frequency  Min 2X/week        Progress Toward Goals  OT Goals(current goals can now be found in the care plan section)  Progress towards OT goals: Progressing toward goals  Acute Rehab OT Goals Patient  Stated Goal: to get this pain better, be able to transfer to [facility] independently OT Goal Formulation: With patient Time For Goal Achievement: 03/16/20 Potential to Achieve Goals: Good ADL Goals Pt Will Perform Grooming: with modified independence;sitting;standing Pt Will Perform Lower Body Dressing: with modified independence;sit to/from stand;with adaptive equipment Pt Will Transfer to Toilet: with min assist;ambulating Additional ADL Goal #1: Pt will progress to EOB independently in preparation for ADL completion.  Plan Discharge plan remains appropriate    Co-evaluation                 AM-PAC OT "6 Clicks" Daily Activity     Outcome Measure   Help from another person eating meals?: None Help from another person taking care of personal grooming?: A Little Help from another person toileting, which includes using toliet, bedpan, or urinal?: A Lot Help from another person bathing (including washing, rinsing, drying)?: A Lot Help from another person to put on and taking off regular upper body clothing?: A Little Help from another person to put on and taking off regular lower body clothing?: A Lot 6 Click Score: 16    End of Session Equipment Utilized During Treatment: Oxygen;Gait belt  OT Visit Diagnosis: Other abnormalities of gait and mobility (R26.89);Muscle weakness (generalized) (M62.81);Pain   Activity Tolerance Patient limited by fatigue   Patient Left in chair;with call bell/phone within reach;with chair alarm set   Nurse Communication Mobility status        Time: 5051-0712 OT Time Calculation (min): 33 min  Charges: OT General Charges $OT Visit: 1 Visit OT Treatments $Self Care/Home Management : 23-37 mins  Layla Maw, OTR/L   Layla Maw 03/06/2020, 1:46 PM

## 2020-03-06 NOTE — Plan of Care (Signed)
  Problem: Nutrition: Goal: Adequate nutrition will be maintained Outcome: Progressing   Problem: Coping: Goal: Level of anxiety will decrease Outcome: Progressing

## 2020-03-06 NOTE — TOC Progression Note (Signed)
Transition of Care [facility]) - Progression Note    Patient Details  Name: AHMYA BERNICK MRN: [id number] Date of Birth: [birth year]  Transition of [facility] CM/SW Contact  Sharlet Salina Mila Homer, LCSW Phone Number: 03/06/2020, 4:16 PM  Clinical Narrative:  CSW followed up with patient's daughter Delenn Ahn [phone]) regarding increased co-pays and she indicated that they will pay and they still want [facility] for [facility].   Contacted [employer] and spoke with Crystal (2:39 pm) and provided her requested information regarding patient for insurance authorization and Prior Auth for Non-Emergent Ambulance Transport. CSW will continue to follow and provide SW intervention services as needed through discharge.     Expected Discharge Plan: Skilled Nursing Facility Barriers to Discharge: Insurance Authorization, No SNF bed (Initiating SNF search 7/9)  Expected Discharge Plan and Services Expected Discharge Plan: [place] In-house Referral: Clinical Social Work                                           Social Determinants of Health (SDOH) Interventions  No SDOH interventions requested or needed at this time.   Readmission Risk Interventions No flowsheet data found.

## 2020-03-07 LAB — BASIC METABOLIC PANEL
Anion gap: 14 (ref 5–15)
BUN: 32 mg/dL — ABNORMAL HIGH (ref 8–23)
CO2: 29 mmol/L (ref 22–32)
Calcium: 9.3 mg/dL (ref 8.9–10.3)
Chloride: 97 mmol/L — ABNORMAL LOW (ref 98–111)
Creatinine, Ser: 0.96 mg/dL (ref 0.44–1.00)
GFR calc Af Amer: >60 mL/min (ref >60)
GFR calc non Af Amer: 56 mL/min — ABNORMAL LOW (ref >60)
Glucose, Bld: 79 mg/dL (ref 70–99)
Potassium: 4 mmol/L (ref 3.5–5.1)
Sodium: 140 mmol/L (ref 135–145)

## 2020-03-07 MED ORDER — PREDNISONE 20 MG PO TABS: 40.0000 mg | ORAL_TABLET | Freq: Every day | ORAL | Status: DC

## 2020-03-07 MED ORDER — PREDNISONE 10 MG PO TABS: 10.0000 mg | ORAL_TABLET | Freq: Every day | ORAL | Status: DC

## 2020-03-07 MED ORDER — PREDNISONE 50 MG PO TABS
50.0000 mg | ORAL_TABLET | Freq: Every day | ORAL | Status: AC
  Administered 2020-03-08: 50 mg via ORAL
  Filled 2020-03-07: qty 1

## 2020-03-07 MED ORDER — OXYCODONE HCL 5 MG PO TABS
2.5000 mg | ORAL_TABLET | Freq: Once | ORAL | Status: AC
  Administered 2020-03-07: 2.5 mg via ORAL
  Filled 2020-03-07: qty 1

## 2020-03-07 MED ORDER — ENSURE ENLIVE PO LIQD
237.0000 mL | ORAL | Status: DC
  Administered 2020-03-07: 237 mL via ORAL

## 2020-03-07 MED ORDER — BOOST / RESOURCE BREEZE PO LIQD CUSTOM
1.0000 | Freq: Two times a day (BID) | ORAL | Status: DC
  Administered 2020-03-07 – 2020-03-08 (×3): 1 via ORAL

## 2020-03-07 MED ORDER — PREDNISONE 20 MG PO TABS: 20.0000 mg | ORAL_TABLET | Freq: Every day | ORAL | Status: DC

## 2020-03-07 MED ORDER — PREDNISONE 20 MG PO TABS: 30.0000 mg | ORAL_TABLET | Freq: Every day | ORAL | Status: DC

## 2020-03-07 NOTE — Progress Notes (Signed)
Nutrition Follow-up  DOCUMENTATION CODES:   Non-severe (moderate) malnutrition in context of chronic illness  INTERVENTION:  Ensure Enlive po daily, each supplement provides 350 kcal and 20 grams of protein  Boost Breeze po BID, each supplement provides 250 kcal and 9 grams of protein  Magic cup BID with meals, each supplement provides 290 kcal and 9 grams of protein  Continue MVI daily  NUTRITION DIAGNOSIS:   Moderate Malnutrition related to chronic illness (CHF, COPD, gout) as evidenced by mild fat depletion, mild muscle depletion, percent weight loss (10% weight loss x 3 months).  Ongoing  GOAL:   Patient will meet greater than or equal to 90% of their needs  Progressing  MONITOR:   PO intake, Supplement acceptance, Labs  REASON FOR ASSESSMENT:   Malnutrition Screening Tool    ASSESSMENT:   79 yo female admitted with heart failure. S/P R heart cath 7/7. PMH includes COPD, HLD, pancreatitis, hepatic steatosis, HTN, TIA, CAD, MI, osteoporosis, gout.  Discussed pt with RN.  Pt endorses continued poor appetite. Pt states that she has been drinking Ensure sporadically and reports that she is getting sick of them. Discussed ordering a variety of supplements to encourage consumption. Pt agreeable to drinking 1 Ensure each day with additional supplements in place of the other Ensure.   PO Intake: 0-100% x last 8 recorded meals (37% average meal intake)  Labs reviewed. Medications: Ensure Enlive BID, ferrous sulfate, folvite, remeron, MVI, Klor-con   Diet Order:   Diet Order            Diet Heart Room service appropriate? Yes; Fluid consistency: Thin  Diet effective now                 EDUCATION NEEDS:   Not appropriate for education at this time  Skin:  Skin Assessment: Skin Integrity Issues: Skin Integrity Issues:: Stage I, Stage III Stage I: L and R heels Stage III: L and R buttocks  Last BM:  7/12  Height:   Ht Readings from Last 1 Encounters:   02/29/20 _0  (1.651 m)    Weight:   Wt Readings from Last 1 Encounters:  03/06/20 70.8 kg    Ideal Body Weight:  56.8 kg  BMI:  Body mass index is 25.97 kg/m.  Estimated Nutritional Needs:   Kcal:  1600-1800  Protein:  100-120 gm  Fluid:  1.6-1.8 L    Larkin Ina, MS, RD, LDN RD pager number and weekend/on-call pager number located in [place].

## 2020-03-07 NOTE — Progress Notes (Addendum)
Physical Therapy Treatment Patient Details Name: AVANELL BANWART MRN: [id number] DOB: 12/03/1940 Today's Date: 03/07/2020    History of Present Illness 60 yof with hypertension, HLD, HFpEF, CAD s/p stent placement, IPF with pulm hypertension, Chrohn's disease, and depression who was transferred from [facility] ED for syncope>hypotension requiring pressor support.     PT Comments    Pt was given encouragement to participate by nursing, and did not understand the importance of therapy.  Pt is working with 8/10 pain on feet and legs, reports she is getting up to transfer to [facility] with nursing.  Pt is SOB per her report with effort to stand, but oxygen sats are supporting the work. Continue to work on standing tolerance, as pt is home alone and will need to have strength and standing endurance and safety to make the transition back home.  ROM is being done by pt so focus is on strengthening.    Follow Up Recommendations  SNF     Equipment Recommendations  None recommended by PT    Recommendations for Other Services       Precautions / Restrictions Precautions Precautions: Fall Precaution Comments: Hx of gout per report that is not managed by medications in bil ankles/knees. Restrictions Weight Bearing Restrictions: No    Mobility  Bed Mobility               General bed mobility comments: up in chair  Transfers Overall transfer level: Needs assistance Equipment used: Rolling walker (2 wheeled);1 person hand held assist Transfers: Sit to/from Stand Sit to Stand: Max assist         General transfer comment: max to power up from recliner every trial, standing up to a minute  Ambulation/Gait             General Gait Details: unable to safely step   Stairs             Wheelchair Mobility    Modified Rankin (Stroke Patients Only)       Balance     Sitting balance-Leahy Scale: Good       Standing balance-Leahy Scale: Poor Standing balance  comment: requires UE support to stand on walker                            Cognition Arousal/Alertness: Awake/alert Behavior During Therapy: WFL for tasks assessed/performed Overall Cognitive Status: Within Functional Limits for tasks assessed                                        Exercises General Exercises - Lower Extremity Ankle Circles/Pumps: AROM;10 reps    General Comments General comments (skin integrity, edema, etc.): pt is on 5L O2 but SOB with standing and sats are 95% at lowest      Pertinent Vitals/Pain Pain Assessment: 0-10 Pain Score: 8  Pain Location: B feet, gout Pain Descriptors / Indicators: Grimacing;Tender Pain Intervention(s): Monitored during session;Repositioned    Home Living                      Prior Function            PT Goals (current goals can now be found in the care plan section) Acute Rehab PT Goals Patient Stated Goal: to walk more independently and have less pain Progress towards PT goals: Progressing toward goals  Frequency    Min 3X/week      PT Plan Current plan remains appropriate    Co-evaluation              AM-PAC PT "6 Clicks" Mobility   Outcome Measure  Help needed turning from your back to your side while in a flat bed without using bedrails?: A Little Help needed moving from lying on your back to sitting on the side of a flat bed without using bedrails?: A Little Help needed moving to and from a bed to a chair (including a wheelchair)?: A Lot Help needed standing up from a chair using your arms (e.g., wheelchair or bedside chair)?: A Lot Help needed to walk in hospital room?: Total Help needed climbing 3-5 steps with a railing? : Total 6 Click Score: 12    End of Session Equipment Utilized During Treatment: Gait belt Activity Tolerance: Patient limited by pain Patient left: in chair;with call bell/phone within reach;with chair alarm set Nurse Communication:  Mobility status;Need for lift equipment PT Visit Diagnosis: Pain;Muscle weakness (generalized) (M62.81);Other abnormalities of gait and mobility (R26.89);Unsteadiness on feet (R26.81) Pain - Right/Left:  (bilateral) Pain - part of body: Ankle and joints of foot;Leg;Knee     Time: 9373-4287 PT Time Calculation (min) (ACUTE ONLY): 19 min  Charges:  $Therapeutic Activity: 8-22 mins                   Ramond Dial 03/07/2020, 4:34 PM  Mee Hives, PT MS Acute Rehab Dept. Number: [place] and [place]

## 2020-03-07 NOTE — Telephone Encounter (Signed)
Sent in [employer] patient enrollment application to [place] for [employer].   Application pending, will continue to follow.  [name], PharmD, BCPS, BCCP, CPP Heart Failure Clinic Pharmacist [phone]

## 2020-03-07 NOTE — Progress Notes (Signed)
PROGRESS NOTE    Shelley [name]  YSA:[id number] DOB: 05-25-1941 DOA: 02/28/2020 PCP: Leeanne Rio, MD      Brief Narrative:  Shelley [name] is a 79 y.o. F with severe pHTN, IPF on Ofev and COPD/emphysema on home O2 5-6L, Crohn's disease, hx CVA no residual deficits, hx DVT, HTN, and CAD who presented from [facility] ER after a syncopal episode.    Patient had been sitting on the toilet at home, passed out.  Daughters called EMS.  Initially emergency services noted BP 56/27.  In the ER at [facility], she was still hypotensive, started on Levophed and transferred to [facility].        Assessment & Plan:  Syncope due to pulmonary hypertension and right ventricular failure Severe pulmonary hypertension Acute on chronic right heart failure Cardiology consulted.  Patient continued on pressors, started on diuretics and hemodynamics improved.    Underwent echo and then [place] on 7/7 that showed improved right sided pressures after diuresis.  Her diuretics and beta-blocker and midodrine were titrated by CHF team.  Her blood pressure appears to have stabilized.  She will follow up with cardiology as an outpatient for further adjustments.  Volume status stable, no new symptoms -Continue midodrine, torsemide, beta-blocker    Coronary artery disease, secondary prevention Demand ischemia She was noted to have some troponin leak.  Cardiology continued her aspirin and Xarelto. -Continue aspirin and Xarelto for CV disease   -Follow up with HF team outpatient  COPD with chronic hypoxic respiratory failure Idiopathic pulmonary fibrosis Asymptomatic -Continue Ofev  Peripheral vascular disease History of stroke Statin intolerant.  Cardiology will attempt to get her into clinic for [place].  She is on aspirin and Xarelto for CVA prevention as above.  Crohn's disease No active symptoms -Continue sulfasalazine and folate     Asymptomatic bacteriuria Remains asymptomatic, no  treatment warranted    Gout flare She had new onset of tenderness in both feet 7/8, which she feels is characteristic of her gout flares.  Feet red and shiny, mostly in the midfoot.  This has improved somewhat, but the patient still has 8 out of 10 pain she claims. -Continue colchicine -Extend prednisone with taper -Resume allopurinol once flare has resolved v   Anemia of chronic disease Hemoglobin unchanged, no clinical bleeding -Continue iron  Pressure injury right and left buttock, stage III, POA Pressure injury right and left heel, stage I, POA  Mood disorder -Continue fluoxetine   Failure to thrive Moderate protein calorie malnutrition As evidenced by moderate loss of subcutaneous muscle mass of back, chronic illness, poor p.o. intake. -Stop Megace -Continue mirtazapine       Disposition: Status is: Inpatient  Remains inpatient appropriate because:Unsafe d/c plan   Dispo: The patient is from: Home              Anticipated d/c is to: SNF              Anticipated d/c date is: When bed available today or tomorrow              Patient currently is medically stable to d/c.   The patient was admitted and diuresed, with cardiology.  All adjustments of her metoprolol, and diuretics are complete, and she is medically ready for discharge to SNF.  As a result of her severe exacerbation of her chronic disease, the patient is debilitated and unable to walk.  At baseline she is able to ambulate around her house, and up stairs  into her house without difficulty, toilet herself, and make transfers.  At present she is too weak to do any of those things, limited in part by resolving gout pain.  We believe she is an excellent candidate for rehabilitation to achieve her prior level of function        MDM: The below labs and imaging reports reviewed and summarized above.  Medication management as above.     DVT prophylaxis: SCDs Start: 02/28/20 1401 rivaroxaban (XARELTO)  tablet 2.5 mg  Code Status: DNR Family Communication:      Consultants:   Cardiology  Critical care  Procedures:   7/4 CT head at [facility]  7/4 CT abdomen and pelvis at [facility] acute abnormalities  7/5 echocardiogram EF 55%, severe RV dysfunction, dilation, elevated right-sided pressures estimated  7/7 right heart cath-cardiac index low normal, filling pressures optimized        Antimicrobials:    None  Culture data:   Blood culture 7/5 no growth  Urine culture 7/5 pansensitive E. Coli  Respiratory culture 7/5 normal respiratory flora          Subjective: Foot redness is better, but still with pain.  No new syncope, chest discomfort, shortness of breath, swelling.    Objective: Vitals:   03/07/20 0528 03/07/20 0948 03/07/20 1656 03/07/20 2038  BP: (!) 96/55 (!) 89/48 (!) 87/49 (!) 94/54  Pulse: (!) 57 (!) 58 72 77  Resp: _0 Temp: 97.6 F (36.4 C) (!) 97.4 F (36.3 C) (!) 97.5 F (36.4 C) (!) 97.5 F (36.4 C)  TempSrc: Axillary Oral Oral Oral  SpO2: 100% 97% 100% 100%  Weight:      Height:        Intake/Output Summary (Last 24 hours) at 03/07/2020 2147 Last data filed at 03/07/2020 1827 Gross per 24 hour  Intake 843 ml  Output 1250 ml  Net -407 ml   Filed Weights   03/05/20 0500 03/06/20 0550 03/06/20 2121  Weight: 76.2 kg 75 kg 70.8 kg    Examination: General appearance: Frail elderly female, lying in bed, no acute distress.  Interactive.     HEENT:    Skin: Feet appear normal.  No swelling or redness. Cardiac: RRR, soft gallop, no lower extremity edema Respiratory: Normal respiratory rate and rhythm, fine rales in the bases Abdomen:   MSK:  Neuro: Extraocular movements intact, moves upper extremities with generalized weakness but normal coordination.  Speech fluent. Psych: Attention normal, affect pleasant, judgment insight normal               Data Reviewed: I have personally  reviewed following labs and imaging studies:  CBC: Recent Labs  Lab 03/01/20 0321 03/01/20 0905 03/01/20 0906 03/01/20 0907 03/02/20 0246 03/03/20 0500 03/04/20 0431  WBC 10.7*  --   --   --  11.6* 13.9* 11.7*  HGB 10.4*   < > 10.5* 9.9* 10.3* 10.1* 10.2*  HCT 32.8*   < > 31.0* 29.0* 31.6* 31.4* 31.9*  MCV 100.9*  --   --   --  102.6* 102.6* 101.9*  PLT 226  --   --   --  221 245 275   < > = values in this interval not displayed.   Basic Metabolic Panel: Recent Labs  Lab 03/01/20 0321 03/01/20 0905 03/01/20 0907 03/02/20 0246 03/03/20 0500 03/04/20 0431 03/07/20 0435  NA 136   < > 142 136 135 139 140  K 3.3*   < > 3.5  4.1 3.5 3.9 4.0  CL 97*  --   --  102 99 100 97*  CO2 27  --   --  _0 GLUCOSE 98  --   --  94 141* 85 79  BUN 15  --   --  12 19 26* 32*  CREATININE 0.86  --   --  0.78 0.78 0.87 0.96  CALCIUM 8.3*  --   --  8.6* 8.9 8.9 9.3   < > = values in this interval not displayed.   GFR: Estimated Creatinine Clearance: 46.1 mL/min (by C-G formula based on SCr of 0.96 mg/dL). Liver Function Tests: No results for input(s): AST, ALT, ALKPHOS, BILITOT, PROT, ALBUMIN in the last 168 hours. No results for input(s): LIPASE, AMYLASE in the last 168 hours. No results for input(s): AMMONIA in the last 168 hours. Coagulation Profile: No results for input(s): INR, PROTIME in the last 168 hours. Cardiac Enzymes: No results for input(s): CKTOTAL, CKMB, CKMBINDEX, TROPONINI in the last 168 hours. BNP (last 3 results) No results for input(s): PROBNP in the last 8760 hours. HbA1C: No results for input(s): HGBA1C in the last 72 hours. CBG: No results for input(s): GLUCAP in the last 168 hours. Lipid Profile: No results for input(s): CHOL, HDL, LDLCALC, TRIG, CHOLHDL, LDLDIRECT in the last 72 hours. Thyroid Function Tests: No results for input(s): TSH, T4TOTAL, FREET4, T3FREE, THYROIDAB in the last 72 hours. Anemia Panel: No results for input(s): VITAMINB12,  FOLATE, FERRITIN, TIBC, IRON, RETICCTPCT in the last 72 hours. Urine analysis:    Component Value Date/Time   COLORURINE AMBER BIOCHEMICALS MAY BE AFFECTED BY COLOR (A) 10/09/2010 1125   APPEARANCEUR CLEAR 10/09/2010 1125   LABSPEC 1.028 10/09/2010 1125   PHURINE 6.5 10/09/2010 1125   HGBUR NEGATIVE 10/09/2010 1125   BILIRUBINUR NEGATIVE 10/09/2010 1125   KETONESUR NEGATIVE 10/09/2010 1125   PROTEINUR NEGATIVE 10/09/2010 1125   UROBILINOGEN 0.2 10/09/2010 1125   NITRITE NEGATIVE 10/09/2010 1125   LEUKOCYTESUR NEGATIVE 10/09/2010 1125   Sepsis Labs: _1 (procalcitonin:4,lacticacidven:4)  ) Recent Results (from the past 240 hour(s))  MRSA PCR Screening     Status: None   Collection Time: 02/28/20 12:49 PM   Specimen: Nasal Mucosa; Nasopharyngeal  Result Value Ref Range Status   MRSA by PCR NEGATIVE NEGATIVE Final    Comment:        The GeneXpert MRSA Assay (FDA approved for NASAL specimens only), is one component of a comprehensive MRSA colonization surveillance program. It is not intended to diagnose MRSA infection nor to guide or monitor treatment for MRSA infections. Performed at [facility], [place] [address], [place], [place] [address]   Culture, blood (routine x 2)     Status: None   Collection Time: 02/28/20  4:54 PM   Specimen: BLOOD  Result Value Ref Range Status   Specimen Description BLOOD RIGHT ANTECUBITAL  Final   Special Requests   Final    BOTTLES DRAWN AEROBIC AND ANAEROBIC Blood Culture adequate volume   Culture   Final    NO GROWTH 5 DAYS Performed at [facility], [place] [address] [place], [place] [address]    Report Status 03/04/2020 FINAL  Final  Culture, blood (routine x 2)     Status: None   Collection Time: 02/28/20  5:00 PM   Specimen: BLOOD RIGHT HAND  Result Value Ref Range Status   Specimen Description BLOOD RIGHT HAND  Final   Special Requests   Final  BOTTLES DRAWN AEROBIC ONLY Blood Culture results may not be  optimal due to an inadequate volume of blood received in culture bottles   Culture   Final    NO GROWTH 5 DAYS Performed at [facility], [place] [address], [place], [place] [address]    Report Status 03/04/2020 FINAL  Final  Urine culture     Status: Abnormal   Collection Time: 02/28/20  6:56 PM   Specimen: Urine, Random  Result Value Ref Range Status   Specimen Description URINE, RANDOM  Final   Special Requests   Final    NONE Performed at [facility], [place] [address] [place], [place] [address]    Culture >=100,000 COLONIES/mL ESCHERICHIA COLI (A)  Final   Report Status 03/02/2020 FINAL  Final   Organism ID, Bacteria ESCHERICHIA COLI (A)  Final      Susceptibility   Escherichia coli - MIC*    AMPICILLIN <=2 SENSITIVE Sensitive     CEFAZOLIN <=4 SENSITIVE Sensitive     CEFTRIAXONE <=0.25 SENSITIVE Sensitive     CIPROFLOXACIN <=0.25 SENSITIVE Sensitive     GENTAMICIN <=1 SENSITIVE Sensitive     IMIPENEM <=0.25 SENSITIVE Sensitive     NITROFURANTOIN <=16 SENSITIVE Sensitive     TRIMETH/SULFA <=20 SENSITIVE Sensitive     AMPICILLIN/SULBACTAM <=2 SENSITIVE Sensitive     PIP/TAZO <=4 SENSITIVE Sensitive     * >=100,000 COLONIES/mL ESCHERICHIA COLI  Culture, respiratory (tracheal aspirate)     Status: None   Collection Time: 02/28/20  6:56 PM   Specimen: Sputum; Respiratory  Result Value Ref Range Status   Specimen Description SPU  Final   Special Requests NONE  Final   Gram Stain   Final    FEW WBC PRESENT, PREDOMINANTLY PMN FEW GRAM POSITIVE COCCI RARE GRAM VARIABLE ROD    Culture   Final    RARE Consistent with normal respiratory flora. Performed at [facility], [place] [address] [place], [place] [address]    Report Status 03/02/2020 FINAL  Final  SARS Coronavirus 2 by RT PCR (hospital order, performed in [facility] hospital lab) Nasopharyngeal Nasopharyngeal Swab     Status: None   Collection Time: 03/06/20  4:53 PM   Specimen: Nasopharyngeal Swab    Result Value Ref Range Status   SARS Coronavirus 2 NEGATIVE NEGATIVE Final    Comment: (NOTE) SARS-CoV-2 target nucleic acids are NOT DETECTED.  The SARS-CoV-2 RNA is generally detectable in upper and lower respiratory specimens during the acute phase of infection. The lowest concentration of SARS-CoV-2 viral copies this assay can detect is 250 copies / mL. A negative result does not preclude SARS-CoV-2 infection and should not be used as the sole basis for treatment or other patient management decisions.  A negative result may occur with improper specimen collection / handling, submission of specimen other than nasopharyngeal swab, presence of viral mutation(s) within the areas targeted by this assay, and inadequate number of viral copies (<250 copies / mL). A negative result must be combined with clinical observations, patient history, and epidemiological information.  Fact Sheet for Patients:   StrictlyIdeas.no  Fact Sheet for Healthcare Providers: BankingDealers.co.za  This test is not yet approved or  cleared by the [place] FDA and has been authorized for detection and/or diagnosis of SARS-CoV-2 by FDA under an Emergency Use Authorization (EUA).  This EUA will remain in effect (meaning this test can be used) for the duration of the COVID-19 declaration under Section  564(b)(1) of the Act, 21 U.S.C. section 360bbb-3(b)(1), unless the authorization is terminated or revoked sooner.  Performed at [facility], [place] [address] [place], [place] [address]          Radiology Studies: No results found.      Scheduled Meds: . aspirin EC  81 mg Oral Daily  . Chlorhexidine Gluconate Cloth  6 each Topical Q0600  . colchicine  0.6 mg Oral Daily  . feeding supplement  1 Container Oral BID BM  . feeding supplement (ENSURE ENLIVE)  237 mL Oral Q24H  . ferrous sulfate  325 mg Oral Daily  . FLUoxetine  40 mg Oral Daily   . folic acid  1 mg Oral Daily  . mouth rinse  15 mL Mouth Rinse BID  . metoprolol succinate  25 mg Oral Daily  . midodrine  7.5 mg Oral TID WC  . mirtazapine  7.5 mg Oral QHS  . multivitamin with minerals  1 tablet Oral Daily  . Nintedanib  1 capsule Oral BID  . potassium chloride  20 mEq Oral BID  . [START ON 03/08/2020] predniSONE  50 mg Oral Q breakfast   Followed by  . [START ON 03/09/2020] predniSONE  40 mg Oral Q breakfast   Followed by  . [START ON 03/10/2020] predniSONE  30 mg Oral Q breakfast   Followed by  . [START ON 03/11/2020] predniSONE  20 mg Oral Q breakfast   Followed by  . [START ON 03/12/2020] predniSONE  10 mg Oral Q breakfast  . rivaroxaban  2.5 mg Oral BID  . sodium chloride flush  3 mL Intravenous Q12H  . sulfaSALAzine  500 mg Oral BID  . torsemide  80 mg Oral Daily   Continuous Infusions: . sodium chloride 10 mL/hr at 03/01/20 2050  . sodium chloride 250 mL (03/01/20 0540)     LOS: 8 days    Time spent: 25 minutes    Edwin Dada, MD [facility] 03/07/2020, 9:47 PM     Please page though [place] or Epic secure chat:  For [employer], Adult nurse

## 2020-03-07 NOTE — Progress Notes (Signed)
PT Cancellation Note  Patient Details Name: Shelley [name] MRN: [id number] DOB: 1940-11-14   Cancelled Treatment:    Reason Eval/Treat Not Completed: Other (comment).  Pt was with nursing earlier, and upon return later was tired, reporting she is "exercising her legs on her own."  Will reattempt at another time.   Ramond Dial 03/07/2020, 3:56 PM   Mee Hives, PT MS Acute Rehab Dept. Number: [place] and [place]

## 2020-03-07 NOTE — TOC Progression Note (Addendum)
Transition of Care [facility]) - Progression Note    Patient Details  Name: MARKAYLA REICHART MRN: [id number] Date of Birth: [birth year]  [facility] CM/SW Contact  Sharlet Salina Mila Homer, LCSW Phone Number: 03/07/2020, 2:53 PM  Clinical Narrative:  CSW received a call from [place] with [employer] this morning (8:40 am) and CSW advised that patient's information was sent to the medical director for review. She added that the medical director is concerned about the patient's participation and does not think that she meets medical criteria for SNF. Tammy requested that PT see patient today as the medical director needs to see proof that she can participate. Talked with nurse and requested that PT see patient today.   CSW also informed that the ambulance request has been approved - [id number] As of 2:56 pm, awaiting PT note in Epic.     4:41 pm: CSW informed by Mee Hives, PT that her note is in. Call made to Tammy with [employer] and informed her. CSW will continue to follow and will await decision from medical director at [facility]..       Expected Discharge Plan: Skilled Nursing Facility Barriers to Discharge: Insurance Authorization, No SNF bed (Initiating SNF search 7/9)  Expected Discharge Plan and Services Expected Discharge Plan: [place] In-house Referral: Clinical Social Work                                           Social Determinants of Health (SDOH) Interventions    Readmission Risk Interventions No flowsheet data found.

## 2020-03-07 NOTE — Progress Notes (Signed)
Patient complaining of constant pain in her feet after activity. Patient only has vicodin ordered for pain. Patient also has hypotension. [place], MD paged to explain situation. MD gave verbal order for oxycodone 2.44m PO once. Order placed and followed. Will continue to monitor.

## 2020-03-08 DIAGNOSIS — I2510 Atherosclerotic heart disease of native coronary artery without angina pectoris: Secondary | ICD-10-CM | POA: Diagnosis not present

## 2020-03-08 DIAGNOSIS — I50810 Right heart failure, unspecified: Secondary | ICD-10-CM | POA: Diagnosis not present

## 2020-03-08 DIAGNOSIS — G4730 Sleep apnea, unspecified: Secondary | ICD-10-CM | POA: Diagnosis not present

## 2020-03-08 DIAGNOSIS — R062 Wheezing: Secondary | ICD-10-CM | POA: Diagnosis not present

## 2020-03-08 DIAGNOSIS — M2550 Pain in unspecified joint: Secondary | ICD-10-CM | POA: Diagnosis not present

## 2020-03-08 DIAGNOSIS — F339 Major depressive disorder, recurrent, unspecified: Secondary | ICD-10-CM | POA: Diagnosis not present

## 2020-03-08 DIAGNOSIS — I272 Pulmonary hypertension, unspecified: Secondary | ICD-10-CM | POA: Diagnosis not present

## 2020-03-08 DIAGNOSIS — J8410 Pulmonary fibrosis, unspecified: Secondary | ICD-10-CM | POA: Diagnosis not present

## 2020-03-08 DIAGNOSIS — R296 Repeated falls: Secondary | ICD-10-CM | POA: Diagnosis not present

## 2020-03-08 DIAGNOSIS — Z7401 Bed confinement status: Secondary | ICD-10-CM | POA: Diagnosis not present

## 2020-03-08 DIAGNOSIS — Z9989 Dependence on other enabling machines and devices: Secondary | ICD-10-CM | POA: Diagnosis not present

## 2020-03-08 DIAGNOSIS — G4733 Obstructive sleep apnea (adult) (pediatric): Secondary | ICD-10-CM | POA: Diagnosis not present

## 2020-03-08 DIAGNOSIS — Z9981 Dependence on supplemental oxygen: Secondary | ICD-10-CM | POA: Diagnosis not present

## 2020-03-08 DIAGNOSIS — R531 Weakness: Secondary | ICD-10-CM | POA: Diagnosis not present

## 2020-03-08 DIAGNOSIS — J9611 Chronic respiratory failure with hypoxia: Secondary | ICD-10-CM | POA: Diagnosis not present

## 2020-03-08 DIAGNOSIS — I5021 Acute systolic (congestive) heart failure: Secondary | ICD-10-CM | POA: Diagnosis not present

## 2020-03-08 DIAGNOSIS — I5032 Chronic diastolic (congestive) heart failure: Secondary | ICD-10-CM | POA: Diagnosis not present

## 2020-03-08 DIAGNOSIS — G459 Transient cerebral ischemic attack, unspecified: Secondary | ICD-10-CM | POA: Diagnosis not present

## 2020-03-08 DIAGNOSIS — J84112 Idiopathic pulmonary fibrosis: Secondary | ICD-10-CM | POA: Diagnosis not present

## 2020-03-08 DIAGNOSIS — J449 Chronic obstructive pulmonary disease, unspecified: Secondary | ICD-10-CM | POA: Diagnosis not present

## 2020-03-08 DIAGNOSIS — R2689 Other abnormalities of gait and mobility: Secondary | ICD-10-CM | POA: Diagnosis not present

## 2020-03-08 DIAGNOSIS — I959 Hypotension, unspecified: Secondary | ICD-10-CM | POA: Diagnosis not present

## 2020-03-08 DIAGNOSIS — R41841 Cognitive communication deficit: Secondary | ICD-10-CM | POA: Diagnosis not present

## 2020-03-08 DIAGNOSIS — M6281 Muscle weakness (generalized): Secondary | ICD-10-CM | POA: Diagnosis not present

## 2020-03-08 DIAGNOSIS — M109 Gout, unspecified: Secondary | ICD-10-CM | POA: Diagnosis not present

## 2020-03-08 DIAGNOSIS — R55 Syncope and collapse: Secondary | ICD-10-CM | POA: Diagnosis not present

## 2020-03-08 DIAGNOSIS — E440 Moderate protein-calorie malnutrition: Secondary | ICD-10-CM | POA: Diagnosis not present

## 2020-03-08 MED ORDER — MIDODRINE HCL 10 MG PO TABS: 10.0000 mg | ORAL_TABLET | Freq: Three times a day (TID) | ORAL | 0 refills | Status: AC

## 2020-03-08 MED ORDER — ADULT MULTIVITAMIN W/MINERALS CH: 1.0000 | ORAL_TABLET | Freq: Every day | ORAL | 0 refills | Status: AC

## 2020-03-08 MED ORDER — MIDODRINE HCL 5 MG PO TABS
10.0000 mg | ORAL_TABLET | Freq: Three times a day (TID) | ORAL | Status: DC
  Administered 2020-03-08: 10 mg via ORAL
  Filled 2020-03-08: qty 2

## 2020-03-08 MED ORDER — HYDROCODONE-ACETAMINOPHEN 10-325 MG PO TABS: 1.0000 | ORAL_TABLET | Freq: Four times a day (QID) | ORAL | 0 refills | Status: AC | PRN

## 2020-03-08 MED ORDER — PREDNISONE 20 MG PO TABS: ORAL_TABLET | ORAL | 0 refills | Status: DC

## 2020-03-08 NOTE — Progress Notes (Signed)
Report called to Theadora Rama, Therapist, sports at [facility].

## 2020-03-08 NOTE — TOC Transition Note (Signed)
Transition of Care [facility]) - CM/SW Discharge Note *03/08/20 - Discharged to [facility] SNF via non-emergency ambulance *Room 132 *Number for Report - [phone]   Patient Details  Name: Shelley [name] MRN: [id number] Date of Birth: 1940/10/10  Transition of Care [facility]) CM/SW Contact:  Sable Feil, LCSW Phone Number: 03/08/2020, 10:53 AM   Clinical Narrative:  Patient medically stable for discharge and insurance authorization received from [employer] this morning. Auth [id number], good for 7 days. Discharge clinicals transmitted to facility and nurse provided with information to call report. Daughter contacted and HIPPA compliant VM left regarding her mother's discharge today. [name] will be transported by non-emergency ambulance transport to facility.    Final next level of care: [facility] SNF) Barriers to Discharge: Barriers Resolved (Received insurance authorization)   Patient Goals and CMS Choice Patient states their goals for this hospitalization and ongoing recovery are:: Patient agreeable to ST rehab (Patient and daughter informed regarding Medicare.gov during initial visit.) CMS Medicare.gov Compare Post Acute Care list provided to:: Other (Comment Required) (List not provided as patient's SNF preference accepted her) Choice offered to / list presented to : Patient, Adult Children  Discharge Placement   Existing PASRR number confirmed : 03/03/20          Patient [facility] bed at: [facility] [facility] [place] Patient to be transferred to facility by: Ambulance Name of family member notified: Daughter Taisia Fantini - [phone] Patient and family notified of of transfer: 03/08/20  Discharge Plan and Services In-house Referral: Clinical Social Work                                 Social Determinants of Health (Long) Interventions  No SDOH interventions requested or needed at  discharge   Readmission Risk Interventions No flowsheet data found.

## 2020-03-08 NOTE — Progress Notes (Signed)
DISCHARGE NOTE [facility] to be discharged Skilled nursing facility per MD order. Patient verbalized understanding.  IV catheter discontinued intact. Site without signs and symptoms of complications. Dressing and pressure applied. Pt denies pain at the site currently. No complaints noted.  Discharge packet assembled. An After Visit Summary (AVS) was printed and given to the EMS personnel. Patient escorted via stretcher and discharged to [facility] via ambulance. Report called to accepting facility; all questions and concerns addressed.   Aneta Mins BSN, RN3

## 2020-03-08 NOTE — Discharge Summary (Signed)
Physician Discharge Summary  Shelley [name] IRS:[id number] DOB: 11-16-1940 DOA: 02/28/2020  PCP: Leeanne Rio, MD  Admit date: 02/28/2020 Discharge date: 03/08/2020  Admitted From: Home  Disposition:  SNF  Recommendations for Outpatient Follow-up:  1. Follow up with PCP in 1-2 weeks 2. Please obtain BMP/CBC in one week 3. Needs to follow up with advance Heart failure team in 1 week.  4. Monitor volume status and renal function.    Discharge Condition: Stable.  CODE STATUS: DNR Diet recommendation: Heart Health  Brief/Interim Summary: Shelley [name] is a 79 y.o. F with severe pHTN, IPF on Ofev and COPD/emphysema on home O2 5-6L, Crohn's disease, hx CVA no residual deficits, hx DVT, HTN, and CAD who presented from [facility] ER after a syncopal episode.    Patient had been sitting on the toilet at home, passed out.  Daughters called EMS.  Initially emergency services noted BP 56/27.  In the ER at [facility], she was still hypotensive, started on Levophed and transferred to [facility].   Syncope due to pulmonary hypertension and right ventricular failure Severe pulmonary hypertension Acute on chronic right heart failure Cardiology consulted.  Patient continued on pressors, started on diuretics and hemodynamics improved.    Underwent echo and then [place] on 7/7 that showed improved right sided pressures after diuresis.  Her diuretics and beta-blocker and midodrine were titrated by CHF team.  Her blood pressure appears to have stabilized.  She will follow up with cardiology as an outpatient for further adjustments.  Volume status stable. -Continue midodrine, torsemide, beta-blocker. -BP soft yesterday, lightheaded. Discussed with heart failure team, recommedation to increase midodrine to 10 mg TID> ok to discharge patient.  -Patient today asymptomatic, vitals stable.     Coronary artery disease, secondary prevention Demand ischemia She was noted to have some troponin leak.   Cardiology continued her aspirin and Xarelto. -Continue aspirin and Xarelto for CV disease   -Follow up with HF team outpatient  COPD with chronic hypoxic respiratory failure Idiopathic pulmonary fibrosis Asymptomatic -Continue Ofev  Peripheral vascular disease History of stroke Statin intolerant.  Cardiology will attempt to get her into clinic for [place].  She is on aspirin and Xarelto for CVA prevention as above.  Crohn's disease No active symptoms -Continue sulfasalazine and folate     Asymptomatic bacteriuria Remains asymptomatic, no treatment warranted    Gout flare She had new onset of tenderness in both feet 7/8, which she feels is characteristic of her gout flares.  Feet red and shiny, mostly in the midfoot.  This has improved somewhat, but the patient still has 8 out of 10 pain she claims. -Continue colchicine -Extend prednisone with taper.  -Resume allopurino.    Anemia of chronic disease Hemoglobin unchanged, no clinical bleeding -Continue iron  Pressure injury right and left buttock, stage III, POA Pressure injury right and left heel, stage I, POA  Mood disorder -Continue fluoxetine   Failure to thrive Moderate protein calorie malnutrition As evidenced by moderate loss of subcutaneous muscle mass of back, chronic illness, poor p.o. intake. -Stop Megace -Continue mirtazapine    Discharge Diagnoses:  Active Problems:   Pressure injury of skin   Hypotension    Discharge Instructions  Discharge Instructions    Diet - low sodium heart healthy   Complete by: As directed    Discharge wound care:   Complete by: As directed    Continue with local care   Increase activity slowly   Complete by: As directed  Allergies as of 03/08/2020      Reactions   Tape Other (See Comments)   SKIN HAS BECOME VERY THIN- TEARS EASILY!!   Crestor [rosuvastatin Calcium] Other (See Comments)   Muscle pain    Doxycycline Other (See Comments)    Possible photosensitivity   Mercaptopurine Other (See Comments)   Pancreatitis   Simvastatin Other (See Comments)   Severe leg aches   Penicillins Rash   Has patient had a PCN reaction causing immediate rash, facial/tongue/throat swelling, SOB or lightheadedness with hypotension: No Has patient had a PCN reaction causing severe rash involving mucus membranes or skin necrosis: No Has patient had a PCN reaction that required hospitalization: No Has patient had a PCN reaction occurring within the last 10 years: No If all of the above answers are "NO", then may proceed with Cephalosporin use.   Sulfa Antibiotics Rash      Medication List    STOP taking these medications   calcium citrate-vitamin D 315-200 MG-UNIT tablet Commonly known as: CITRACAL+D   megestrol 20 MG tablet Commonly known as: MEGACE   OSTEO BI-FLEX JOINT SHIELD PO   spironolactone 25 MG tablet Commonly known as: ALDACTONE     TAKE these medications   albuterol 108 (90 Base) MCG/ACT inhaler Commonly known as: ProAir HFA Inhale 2 puffs into the lungs every 4 (four) hours as needed for wheezing or shortness of breath.   allopurinol 100 MG tablet Commonly known as: ZYLOPRIM Take 100 mg by mouth every evening.   aspirin 81 MG tablet Take 81 mg by mouth daily.   colchicine 0.6 MG tablet Take 1 tablet (0.6 mg total) by mouth daily. What changed: when to take this   ferrous sulfate 325 (65 FE) MG tablet Take 325 mg by mouth daily.   Fish Oil 1200 MG Caps Take 1,200 mg by mouth 2 (two) times a week.   FLUoxetine 40 MG capsule Commonly known as: PROZAC Take 40 mg by mouth daily.   folic acid 1 MG tablet Commonly known as: FOLVITE TAKE 1 TABLET BY MOUTH EVERY DAY What changed: when to take this   HYDROcodone-acetaminophen 10-325 MG tablet Commonly known as: NORCO Take 1 tablet by mouth every 6 (six) hours as needed for up to 3 days for moderate pain.   metoprolol succinate 25 MG 24 hr tablet Commonly  known as: TOPROL-XL TAKE 1 TABLET BY MOUTH EVERY DAY What changed: when to take this   midodrine 10 MG tablet Commonly known as: PROAMATINE Take 1 tablet (10 mg total) by mouth 3 (three) times daily with meals.   mirtazapine 15 MG tablet Commonly known as: REMERON Take 7.5 mg by mouth at bedtime.   multivitamin with minerals Tabs tablet Take 1 tablet by mouth daily. Start taking on: March 09, 2020   nitroGLYCERIN 0.4 MG SL tablet Commonly known as: NITROSTAT Place 1 tablet (0.4 mg total) under the tongue every 5 (five) minutes x 3 doses as needed. What changed: reasons to take this   Ofev 100 MG Caps Generic drug: Nintedanib TAKE 1 CAPSULE TWICE A DAY What changed:   how much to take  when to take this   potassium chloride SA 20 MEQ tablet Commonly known as: KLOR-CON TAKE 1 TABLET BY MOUTH TWICE DAILY   predniSONE 20 MG tablet Commonly known as: DELTASONE Take 40 mg for one day, then 30 mg for one day then 20 mg for one day the 10 mg for one day, then stop.   sulfaSALAzine  500 MG tablet Commonly known as: Azulfidine Take 1 tablet (500 mg total) by mouth 2 (two) times daily.   torsemide 20 MG tablet Commonly known as: DEMADEX Take 4 tablets (80 mg total) by mouth daily.   Xarelto 2.5 MG Tabs tablet Generic drug: rivaroxaban TAKE 1 TABLET BY MOUTH TWICE DAILY What changed: how much to take            Discharge Care Instructions  (From admission, onward)         Start     Ordered   03/08/20 0000  Discharge wound care:       Comments: Continue with local care   03/08/20 0939          Follow-up Information    Larey [place]er, MD Follow up on 03/10/2020.   Specialty: Cardiology Why: 12:40 PM  Contact information: [address] [place] [address] [phone]              Allergies  Allergen Reactions  . Tape Other (See Comments)    SKIN HAS BECOME VERY THIN- TEARS EASILY!!  . Crestor [Rosuvastatin Calcium] Other  (See Comments)    Muscle pain   . Doxycycline Other (See Comments)    Possible photosensitivity  . Mercaptopurine Other (See Comments)    Pancreatitis  . Simvastatin Other (See Comments)    Severe leg aches  . Penicillins Rash    Has patient had a PCN reaction causing immediate rash, facial/tongue/throat swelling, SOB or lightheadedness with hypotension: No Has patient had a PCN reaction causing severe rash involving mucus membranes or skin necrosis: No Has patient had a PCN reaction that required hospitalization: No Has patient had a PCN reaction occurring within the last 10 years: No If all of the above answers are "NO", then may proceed with Cephalosporin use.   . Sulfa Antibiotics Rash    Consultations: HF team.   Procedures/Studies: CARDIAC CATHETERIZATION  Result Date: 03/01/2020 1. Optimized filling pressures. 2. Low cardiac index at 2.0 3. Moderate pulmonary arterial hypertension with PVR 9.1 WU. Can stop IV Lasix.  Will plan to start Tyvaso, will likely need to be as outpatient.   ECHOCARDIOGRAM LIMITED  Result Date: 02/28/2020    ECHOCARDIOGRAM LIMITED REPORT   Patient Name:   BRICELYN FREESTONE Date of Exam: 02/28/2020 Medical Rec #:  [id number]         Height:       65.0 in Accession #:    [id number]        Weight:       162.0 lb Date of Birth:  31-Jul-1941         BSA:          1.809 m Patient Age:    78 years          BP:           91/49 mmHg Patient Gender: F                 HR:           71 bpm. Exam Location:  Inpatient Procedure: Limited Echo, Cardiac Doppler and Color Doppler Indications:    Syncope  History:        Patient has prior history of Echocardiogram examinations, most                 recent 11/30/2019. CAD, COPD; Risk Factors:Hypertension,  Dyslipidemia, Former Smoker and Sleep Apnea. H/o DVT.  Sonographer:    Clayton Lefort RDCS (AE) Referring Phys: [place]  1. Left ventricular ejection fraction, by estimation, is 55%. There is  moderate left ventricular hypertrophy. Left ventricular diastolic parameters are indeterminate. There is the interventricular septum is flattened in systole and diastole, consistent with right ventricular pressure and volume overload.  2. Right ventricular systolic function is severely reduced. The right ventricular size is severely enlarged. Mildly increased right ventricular wall thickness. There is severely elevated pulmonary artery systolic pressure. The estimated right ventricular systolic pressure is 694.5 mmHg.  3. The mitral valve is abnormal. Trivial mitral valve regurgitation.  4. Tricuspid valve regurgitation is moderate to severe.  5. The aortic valve is tricuspid. Mild to moderate aortic valve sclerosis/calcification is present, without any evidence of aortic stenosis.  6. The pulmonic valve is mildly calcified.  7. The inferior vena cava is dilated in size with <50% respiratory variability, suggesting right atrial pressure of 15 mmHg. FINDINGS  Left Ventricle: Left ventricular ejection fraction, by estimation, is 55%. The left ventricular internal cavity size was normal in size. There is moderate left ventricular hypertrophy. The interventricular septum is flattened in systole and diastole, consistent with right ventricular pressure and volume overload. Right Ventricle: The right ventricular size is severely enlarged. Mildly increased right ventricular wall thickness. Right ventricular systolic function is severely reduced. There is severely elevated pulmonary artery systolic pressure. The tricuspid regurgitant velocity is 4.86 m/s, and with an assumed right atrial pressure of 15 mmHg, the estimated right ventricular systolic pressure is 038.8 mmHg. Mitral Valve: The mitral valve is abnormal. There is mild thickening of the mitral valve leaflet(s). Mild mitral annular calcification. Trivial mitral valve regurgitation. Tricuspid Valve: The tricuspid valve is grossly normal. Tricuspid valve regurgitation  is moderate to severe. Aortic Valve: The aortic valve is tricuspid. Mild to moderate aortic valve sclerosis/calcification is present, without any evidence of aortic stenosis. Moderate aortic valve annular calcification. Pulmonic Valve: The pulmonic valve is mildly calcified. The pulmonic valve was grossly normal. Pulmonic valve regurgitation is trivial. Venous: The inferior vena cava is dilated in size with less than 50% respiratory variability, suggesting right atrial pressure of 15 mmHg.  LEFT VENTRICLE PLAX 2D LVIDd:         3.20 cm LVIDs:         2.60 cm LV PW:         1.50 cm LV IVS:        1.50 cm LVOT diam:     1.80 cm LVOT Area:     2.54 cm  LV Volumes (MOD) LV vol d, MOD A2C: 54.3 ml LV vol d, MOD A4C: 55.7 ml LV vol s, MOD A2C: 19.5 ml LV vol s, MOD A4C: 23.3 ml LV SV MOD A2C:     34.8 ml LV SV MOD A4C:     55.7 ml LV SV MOD BP:      32.0 ml RIGHT VENTRICLE            IVC RV S prime:     6.85 cm/s  IVC diam: 2.40 cm TAPSE (M-mode): 1.1 cm LEFT ATRIUM         Index LA diam:    2.50 cm 1.38 cm/m  TRICUSPID VALVE TR Peak grad:   94.5 mmHg TR Vmax:        486.00 cm/s  SHUNTS Systemic Diam: 1.80 cm Rozann Lesches MD Electronically signed by Rozann Lesches MD  Signature Date/Time: 02/28/2020/5:43:42 PM    Final       Subjective: Feeling well, denies worsening. Dyspnea. She had lightheaded yesterday. None today.   Discharge Exam: Vitals:   03/07/20 2038 03/08/20 0853  BP: (!) 94/54 (!) 102/57  Pulse: 77 79  Resp: 15 18  Temp: (!) 97.5 F (36.4 C) 97.9 F (36.6 C)  SpO2: 100% 99%     General: Pt is alert, awake, not in acute distress Cardiovascular: RRR, S1/S2 +, no rubs, no gallops Respiratory: CTA bilaterally, no wheezing, no rhonchi Abdominal: Soft, NT, ND, bowel sounds + Extremities: no edema, no cyanosis    The results of significant diagnostics from this hospitalization (including imaging, microbiology, ancillary and laboratory) are listed below for reference.      Microbiology: Recent Results (from the past 240 hour(s))  MRSA PCR Screening     Status: None   Collection Time: 02/28/20 12:49 PM   Specimen: Nasal Mucosa; Nasopharyngeal  Result Value Ref Range Status   MRSA by PCR NEGATIVE NEGATIVE Final    Comment:        The GeneXpert MRSA Assay (FDA approved for NASAL specimens only), is one component of a comprehensive MRSA colonization surveillance program. It is not intended to diagnose MRSA infection nor to guide or monitor treatment for MRSA infections. Performed at [facility], [place] [address], [place], [place] [address]   Culture, blood (routine x 2)     Status: None   Collection Time: 02/28/20  4:54 PM   Specimen: BLOOD  Result Value Ref Range Status   Specimen Description BLOOD RIGHT ANTECUBITAL  Final   Special Requests   Final    BOTTLES DRAWN AEROBIC AND ANAEROBIC Blood Culture adequate volume   Culture   Final    NO GROWTH 5 DAYS Performed at [facility], [place] [address], [place], [place] [address]    Report Status 03/04/2020 FINAL  Final  Culture, blood (routine x 2)     Status: None   Collection Time: 02/28/20  5:00 PM   Specimen: BLOOD RIGHT HAND  Result Value Ref Range Status   Specimen Description BLOOD RIGHT HAND  Final   Special Requests   Final    BOTTLES DRAWN AEROBIC ONLY Blood Culture results may not be optimal due to an inadequate volume of blood received in culture bottles   Culture   Final    NO GROWTH 5 DAYS Performed at [facility], [place] [address], [place],  [address]    Report Status 03/04/2020 FINAL  Final  Urine culture     Status: Abnormal   Collection Time: 02/28/20  6:56 PM   Specimen: Urine, Random  Result Value Ref Range Status   Specimen Description URINE, RANDOM  Final   Special Requests   Final    NONE Performed at [facility], [place] [address] [place], [place] [address]    Culture >=100,000 COLONIES/mL ESCHERICHIA COLI (A)  Final   Report Status  03/02/2020 FINAL  Final   Organism ID, Bacteria ESCHERICHIA COLI (A)  Final      Susceptibility   Escherichia coli - MIC*    AMPICILLIN <=2 SENSITIVE Sensitive     CEFAZOLIN <=4 SENSITIVE Sensitive     CEFTRIAXONE <=0.25 SENSITIVE Sensitive     CIPROFLOXACIN <=0.25 SENSITIVE Sensitive     GENTAMICIN <=1 SENSITIVE Sensitive     IMIPENEM <=0.25 SENSITIVE Sensitive     NITROFURANTOIN <=16 SENSITIVE Sensitive  TRIMETH/SULFA <=20 SENSITIVE Sensitive     AMPICILLIN/SULBACTAM <=2 SENSITIVE Sensitive     PIP/TAZO <=4 SENSITIVE Sensitive     * >=100,000 COLONIES/mL ESCHERICHIA COLI  Culture, respiratory (tracheal aspirate)     Status: None   Collection Time: 02/28/20  6:56 PM   Specimen: Sputum; Respiratory  Result Value Ref Range Status   Specimen Description SPU  Final   Special Requests NONE  Final   Gram Stain   Final    FEW WBC PRESENT, PREDOMINANTLY PMN FEW GRAM POSITIVE COCCI RARE GRAM VARIABLE ROD    Culture   Final    RARE Consistent with normal respiratory flora. Performed at [facility], [place] [address], [place], [place] [address]    Report Status 03/02/2020 FINAL  Final  SARS Coronavirus 2 by RT PCR (hospital order, performed in [facility] hospital lab) Nasopharyngeal Nasopharyngeal Swab     Status: None   Collection Time: 03/06/20  4:53 PM   Specimen: Nasopharyngeal Swab  Result Value Ref Range Status   SARS Coronavirus 2 NEGATIVE NEGATIVE Final    Comment: (NOTE) SARS-CoV-2 target nucleic acids are NOT DETECTED.  The SARS-CoV-2 RNA is generally detectable in upper and lower respiratory specimens during the acute phase of infection. The lowest concentration of SARS-CoV-2 viral copies this assay can detect is 250 copies / mL. A negative result does not preclude SARS-CoV-2 infection and should not be used as the sole basis for treatment or other patient management decisions.  A negative result may occur with improper specimen collection / handling, submission  of specimen other than nasopharyngeal swab, presence of viral mutation(s) within the areas targeted by this assay, and inadequate number of viral copies (<250 copies / mL). A negative result must be combined with clinical observations, patient history, and epidemiological information.  Fact Sheet for Patients:   StrictlyIdeas.no  Fact Sheet for Healthcare Providers: BankingDealers.co.za  This test is not yet approved or  cleared by the [place] FDA and has been authorized for detection and/or diagnosis of SARS-CoV-2 by FDA under an Emergency Use Authorization (EUA).  This EUA will remain in effect (meaning this test can be used) for the duration of the COVID-19 declaration under Section 564(b)(1) of the Act, 21 U.S.C. section 360bbb-3(b)(1), unless the authorization is terminated or revoked sooner.  Performed at [facility], [place] [address], [place], [place] [address]      Labs: BNP (last 3 results) Recent Labs    02/28/20 1653  BNP 9,800.1*   Basic Metabolic Panel: Recent Labs  Lab 03/02/20 0246 03/03/20 0500 03/04/20 0431 03/07/20 0435  NA 136 135 139 140  K 4.1 3.5 3.9 4.0  CL 102 99 100 97*  CO2 _0 GLUCOSE 94 141* 85 79  BUN 12 19 26* 32*  CREATININE 0.78 0.78 0.87 0.96  CALCIUM 8.6* 8.9 8.9 9.3   Liver Function Tests: No results for input(s): AST, ALT, ALKPHOS, BILITOT, PROT, ALBUMIN in the last 168 hours. No results for input(s): LIPASE, AMYLASE in the last 168 hours. No results for input(s): AMMONIA in the last 168 hours. CBC: Recent Labs  Lab 03/02/20 0246 03/03/20 0500 03/04/20 0431  WBC 11.6* 13.9* 11.7*  HGB 10.3* 10.1* 10.2*  HCT 31.6* 31.4* 31.9*  MCV 102.6* 102.6* 101.9*  PLT 221 245 275   Cardiac Enzymes: No results for input(s): CKTOTAL, CKMB, CKMBINDEX, TROPONINI in the last 168 hours. BNP: Invalid input(s): POCBNP CBG: No results for input(s): GLUCAP  in the last  168 hours. D-Dimer No results for input(s): DDIMER in the last 72 hours. Hgb A1c No results for input(s): HGBA1C in the last 72 hours. Lipid Profile No results for input(s): CHOL, HDL, LDLCALC, TRIG, CHOLHDL, LDLDIRECT in the last 72 hours. Thyroid function studies No results for input(s): TSH, T4TOTAL, T3FREE, THYROIDAB in the last 72 hours.  Invalid input(s): FREET3 Anemia work up No results for input(s): VITAMINB12, FOLATE, FERRITIN, TIBC, IRON, RETICCTPCT in the last 72 hours. Urinalysis    Component Value Date/Time   COLORURINE AMBER BIOCHEMICALS MAY BE AFFECTED BY COLOR (A) 10/09/2010 1125   APPEARANCEUR CLEAR 10/09/2010 1125   LABSPEC 1.028 10/09/2010 1125   PHURINE 6.5 10/09/2010 1125   HGBUR NEGATIVE 10/09/2010 1125   BILIRUBINUR NEGATIVE 10/09/2010 1125   KETONESUR NEGATIVE 10/09/2010 1125   PROTEINUR NEGATIVE 10/09/2010 1125   UROBILINOGEN 0.2 10/09/2010 1125   NITRITE NEGATIVE 10/09/2010 1125   LEUKOCYTESUR NEGATIVE 10/09/2010 1125   Sepsis Labs Invalid input(s): PROCALCITONIN,  WBC,  LACTICIDVEN Microbiology Recent Results (from the past 240 hour(s))  MRSA PCR Screening     Status: None   Collection Time: 02/28/20 12:49 PM   Specimen: Nasal Mucosa; Nasopharyngeal  Result Value Ref Range Status   MRSA by PCR NEGATIVE NEGATIVE Final    Comment:        The GeneXpert MRSA Assay (FDA approved for NASAL specimens only), is one component of a comprehensive MRSA colonization surveillance program. It is not intended to diagnose MRSA infection nor to guide or monitor treatment for MRSA infections. Performed at [facility], [place] [address], [place], [place] [address]   Culture, blood (routine x 2)     Status: None   Collection Time: 02/28/20  4:54 PM   Specimen: BLOOD  Result Value Ref Range Status   Specimen Description BLOOD RIGHT ANTECUBITAL  Final   Special Requests   Final    BOTTLES DRAWN AEROBIC AND ANAEROBIC Blood Culture adequate volume    Culture   Final    NO GROWTH 5 DAYS Performed at [facility], [place] [address], [place], [place] [address]    Report Status 03/04/2020 FINAL  Final  Culture, blood (routine x 2)     Status: None   Collection Time: 02/28/20  5:00 PM   Specimen: BLOOD RIGHT HAND  Result Value Ref Range Status   Specimen Description BLOOD RIGHT HAND  Final   Special Requests   Final    BOTTLES DRAWN AEROBIC ONLY Blood Culture results may not be optimal due to an inadequate volume of blood received in culture bottles   Culture   Final    NO GROWTH 5 DAYS Performed at [facility], [place] [address] [place],  [address]    Report Status 03/04/2020 FINAL  Final  Urine culture     Status: Abnormal   Collection Time: 02/28/20  6:56 PM   Specimen: Urine, Random  Result Value Ref Range Status   Specimen Description URINE, RANDOM  Final   Special Requests   Final    NONE Performed at [facility], [place] [address], [place],  [address]    Culture >=100,000 COLONIES/mL ESCHERICHIA COLI (A)  Final   Report Status 03/02/2020 FINAL  Final   Organism ID, Bacteria ESCHERICHIA COLI (A)  Final      Susceptibility   Escherichia coli - MIC*    AMPICILLIN <=2 SENSITIVE Sensitive     CEFAZOLIN <=4 SENSITIVE Sensitive  CEFTRIAXONE <=0.25 SENSITIVE Sensitive     CIPROFLOXACIN <=0.25 SENSITIVE Sensitive     GENTAMICIN <=1 SENSITIVE Sensitive     IMIPENEM <=0.25 SENSITIVE Sensitive     NITROFURANTOIN <=16 SENSITIVE Sensitive     TRIMETH/SULFA <=20 SENSITIVE Sensitive     AMPICILLIN/SULBACTAM <=2 SENSITIVE Sensitive     PIP/TAZO <=4 SENSITIVE Sensitive     * >=100,000 COLONIES/mL ESCHERICHIA COLI  Culture, respiratory (tracheal aspirate)     Status: None   Collection Time: 02/28/20  6:56 PM   Specimen: Sputum; Respiratory  Result Value Ref Range Status   Specimen Description SPU  Final   Special Requests NONE  Final   Gram Stain   Final    FEW WBC PRESENT, PREDOMINANTLY PMN FEW GRAM  POSITIVE COCCI RARE GRAM VARIABLE ROD    Culture   Final    RARE Consistent with normal respiratory flora. Performed at [facility], [place] [address], [place], [place] [address]    Report Status 03/02/2020 FINAL  Final  SARS Coronavirus 2 by RT PCR (hospital order, performed in [facility] hospital lab) Nasopharyngeal Nasopharyngeal Swab     Status: None   Collection Time: 03/06/20  4:53 PM   Specimen: Nasopharyngeal Swab  Result Value Ref Range Status   SARS Coronavirus 2 NEGATIVE NEGATIVE Final    Comment: (NOTE) SARS-CoV-2 target nucleic acids are NOT DETECTED.  The SARS-CoV-2 RNA is generally detectable in upper and lower respiratory specimens during the acute phase of infection. The lowest concentration of SARS-CoV-2 viral copies this assay can detect is 250 copies / mL. A negative result does not preclude SARS-CoV-2 infection and should not be used as the sole basis for treatment or other patient management decisions.  A negative result may occur with improper specimen collection / handling, submission of specimen other than nasopharyngeal swab, presence of viral mutation(s) within the areas targeted by this assay, and inadequate number of viral copies (<250 copies / mL). A negative result must be combined with clinical observations, patient history, and epidemiological information.  Fact Sheet for Patients:   StrictlyIdeas.no  Fact Sheet for Healthcare Providers: BankingDealers.co.za  This test is not yet approved or  cleared by the [place] FDA and has been authorized for detection and/or diagnosis of SARS-CoV-2 by FDA under an Emergency Use Authorization (EUA).  This EUA will remain in effect (meaning this test can be used) for the duration of the COVID-19 declaration under Section 564(b)(1) of the Act, 21 U.S.C. section 360bbb-3(b)(1), unless the authorization is terminated or revoked sooner.  Performed at  [facility], [place] [address], [place], [place] [address]      Time coordinating discharge: 40 minutes  SIGNED:   Elmarie Shiley, MD  [facility]

## 2020-03-09 DIAGNOSIS — J449 Chronic obstructive pulmonary disease, unspecified: Secondary | ICD-10-CM | POA: Diagnosis not present

## 2020-03-09 DIAGNOSIS — I5021 Acute systolic (congestive) heart failure: Secondary | ICD-10-CM | POA: Diagnosis not present

## 2020-03-09 DIAGNOSIS — J8410 Pulmonary fibrosis, unspecified: Secondary | ICD-10-CM | POA: Diagnosis not present

## 2020-03-09 DIAGNOSIS — R55 Syncope and collapse: Secondary | ICD-10-CM | POA: Diagnosis not present

## 2020-03-10 ENCOUNTER — Encounter (HOSPITAL_COMMUNITY): Payer: PPO | Admitting: Cardiology

## 2020-03-24 ENCOUNTER — Other Ambulatory Visit: Payer: Self-pay | Admitting: Adult Health

## 2020-04-02 DIAGNOSIS — J449 Chronic obstructive pulmonary disease, unspecified: Secondary | ICD-10-CM | POA: Diagnosis not present

## 2020-04-02 DIAGNOSIS — J8410 Pulmonary fibrosis, unspecified: Secondary | ICD-10-CM | POA: Diagnosis not present

## 2020-04-02 DIAGNOSIS — I5021 Acute systolic (congestive) heart failure: Secondary | ICD-10-CM | POA: Diagnosis not present

## 2020-04-05 DIAGNOSIS — I509 Heart failure, unspecified: Secondary | ICD-10-CM | POA: Diagnosis not present

## 2020-04-05 DIAGNOSIS — J449 Chronic obstructive pulmonary disease, unspecified: Secondary | ICD-10-CM | POA: Diagnosis not present

## 2020-04-06 ENCOUNTER — Other Ambulatory Visit (HOSPITAL_COMMUNITY): Payer: Self-pay | Admitting: Cardiology

## 2020-04-07 NOTE — Telephone Encounter (Signed)
Patient enrollment for Shelley [name] has been processed through [place]. Patient will need to obtain Tyvaso through [place] [employer] program. Patient forms from Assist were sent to patient's daughter last week and have not been returned yet.   [name], PharmD, BCPS, BCCP, CPP Heart Failure Clinic Pharmacist [phone]

## 2020-04-10 DIAGNOSIS — M5136 Other intervertebral disc degeneration, lumbar region: Secondary | ICD-10-CM | POA: Diagnosis not present

## 2020-04-10 DIAGNOSIS — M109 Gout, unspecified: Secondary | ICD-10-CM | POA: Diagnosis not present

## 2020-04-10 DIAGNOSIS — R54 Age-related physical debility: Secondary | ICD-10-CM | POA: Diagnosis not present

## 2020-04-10 DIAGNOSIS — F329 Major depressive disorder, single episode, unspecified: Secondary | ICD-10-CM | POA: Diagnosis not present

## 2020-04-10 DIAGNOSIS — I959 Hypotension, unspecified: Secondary | ICD-10-CM | POA: Diagnosis not present

## 2020-04-10 DIAGNOSIS — Z09 Encounter for follow-up examination after completed treatment for conditions other than malignant neoplasm: Secondary | ICD-10-CM | POA: Diagnosis not present

## 2020-04-10 DIAGNOSIS — E440 Moderate protein-calorie malnutrition: Secondary | ICD-10-CM | POA: Diagnosis not present

## 2020-04-10 DIAGNOSIS — Z741 Need for assistance with personal care: Secondary | ICD-10-CM | POA: Diagnosis not present

## 2020-04-10 DIAGNOSIS — Z9981 Dependence on supplemental oxygen: Secondary | ICD-10-CM | POA: Diagnosis not present

## 2020-04-10 DIAGNOSIS — J84112 Idiopathic pulmonary fibrosis: Secondary | ICD-10-CM | POA: Diagnosis not present

## 2020-04-10 DIAGNOSIS — L602 Onychogryphosis: Secondary | ICD-10-CM | POA: Diagnosis not present

## 2020-04-10 DIAGNOSIS — J9611 Chronic respiratory failure with hypoxia: Secondary | ICD-10-CM | POA: Diagnosis not present

## 2020-04-17 NOTE — Telephone Encounter (Signed)
Sent in provider portion of Manufacturer's Assistance application to [place] for Tyvaso.    Application pending, will continue to follow.  [name], PharmD, BCPS, BCCP, CPP Heart Failure Clinic Pharmacist [phone]

## 2020-04-18 DIAGNOSIS — I5032 Chronic diastolic (congestive) heart failure: Secondary | ICD-10-CM | POA: Diagnosis not present

## 2020-04-18 DIAGNOSIS — R2689 Other abnormalities of gait and mobility: Secondary | ICD-10-CM | POA: Diagnosis not present

## 2020-04-18 DIAGNOSIS — R062 Wheezing: Secondary | ICD-10-CM | POA: Diagnosis not present

## 2020-04-27 NOTE — Telephone Encounter (Signed)
Received message from [place] that patient has been denied for Tyvaso assistance as she has been unreachable. Both patient's and daughter's VM are full so I was unable to communicate this message. If patient is able to be reached at a later date after she has been discharged from SNF, will reapply for Tyvaso.   [name], PharmD, BCPS, BCCP, CPP Heart Failure Clinic Pharmacist [phone]

## 2020-05-06 DIAGNOSIS — J449 Chronic obstructive pulmonary disease, unspecified: Secondary | ICD-10-CM | POA: Diagnosis not present

## 2020-05-06 DIAGNOSIS — I509 Heart failure, unspecified: Secondary | ICD-10-CM | POA: Diagnosis not present

## 2020-05-08 DIAGNOSIS — L89892 Pressure ulcer of other site, stage 2: Secondary | ICD-10-CM | POA: Diagnosis not present

## 2020-05-08 DIAGNOSIS — M79672 Pain in left foot: Secondary | ICD-10-CM | POA: Diagnosis not present

## 2020-05-10 ENCOUNTER — Other Ambulatory Visit (HOSPITAL_COMMUNITY): Payer: Self-pay | Admitting: Cardiology

## 2020-05-19 DIAGNOSIS — R2689 Other abnormalities of gait and mobility: Secondary | ICD-10-CM | POA: Diagnosis not present

## 2020-05-19 DIAGNOSIS — R062 Wheezing: Secondary | ICD-10-CM | POA: Diagnosis not present

## 2020-05-19 DIAGNOSIS — I5032 Chronic diastolic (congestive) heart failure: Secondary | ICD-10-CM | POA: Diagnosis not present

## 2020-05-29 DIAGNOSIS — M79672 Pain in left foot: Secondary | ICD-10-CM | POA: Diagnosis not present

## 2020-05-29 DIAGNOSIS — L89892 Pressure ulcer of other site, stage 2: Secondary | ICD-10-CM | POA: Diagnosis not present

## 2020-06-05 DIAGNOSIS — I509 Heart failure, unspecified: Secondary | ICD-10-CM | POA: Diagnosis not present

## 2020-06-05 DIAGNOSIS — J449 Chronic obstructive pulmonary disease, unspecified: Secondary | ICD-10-CM | POA: Diagnosis not present

## 2020-06-13 ENCOUNTER — Telehealth: Payer: Self-pay | Admitting: Pulmonary Disease

## 2020-06-13 NOTE — Telephone Encounter (Signed)
Prescription refill was received from [facility] requesting to continue or discontinue Ofev.  [facility] prescription was signed to continue by Dr Vaughan Browner. Dr Vaughan Browner requested follow with Patient. Patient stated she was unable to continue Ofev, because she was no longer receiving assistance. Patient stated she has been off Ofev for 4 weeks. Patient is scheduled a follow up OV with Dr Vaughan Browner 06/30/20, at 0915.  Will route message to Pharmacy team to follow up with Ofev assistance

## 2020-06-14 NOTE — Telephone Encounter (Signed)
Patient had PAF grant that was closed due to inactivity. Applied for patient a new PF grant. Patient was APPROVED for [employer] for copay assistance. Coverage dates are 05/15/20 to 05/14/21.  Pharmacy Card [phone] [id number]  Called [facility] and provided new grant information. Called patient and advised.  Nothing further is needed.

## 2020-06-18 DIAGNOSIS — R2689 Other abnormalities of gait and mobility: Secondary | ICD-10-CM | POA: Diagnosis not present

## 2020-06-18 DIAGNOSIS — I5032 Chronic diastolic (congestive) heart failure: Secondary | ICD-10-CM | POA: Diagnosis not present

## 2020-06-18 DIAGNOSIS — R062 Wheezing: Secondary | ICD-10-CM | POA: Diagnosis not present

## 2020-06-20 DIAGNOSIS — R2689 Other abnormalities of gait and mobility: Secondary | ICD-10-CM | POA: Diagnosis not present

## 2020-06-20 DIAGNOSIS — I5032 Chronic diastolic (congestive) heart failure: Secondary | ICD-10-CM | POA: Diagnosis not present

## 2020-06-20 DIAGNOSIS — R062 Wheezing: Secondary | ICD-10-CM | POA: Diagnosis not present

## 2020-06-21 DIAGNOSIS — R062 Wheezing: Secondary | ICD-10-CM | POA: Diagnosis not present

## 2020-06-21 DIAGNOSIS — I5032 Chronic diastolic (congestive) heart failure: Secondary | ICD-10-CM | POA: Diagnosis not present

## 2020-06-21 DIAGNOSIS — R2689 Other abnormalities of gait and mobility: Secondary | ICD-10-CM | POA: Diagnosis not present

## 2020-06-30 ENCOUNTER — Encounter: Payer: Self-pay | Admitting: Pulmonary Disease

## 2020-06-30 ENCOUNTER — Ambulatory Visit (INDEPENDENT_AMBULATORY_CARE_PROVIDER_SITE_OTHER): Payer: PPO | Admitting: Pulmonary Disease

## 2020-06-30 ENCOUNTER — Other Ambulatory Visit: Payer: Self-pay

## 2020-06-30 VITALS — BP 116/66 | HR 78 | Temp 97.2°F | Ht 65.0 in

## 2020-06-30 DIAGNOSIS — Z23 Encounter for immunization: Secondary | ICD-10-CM

## 2020-06-30 DIAGNOSIS — J84112 Idiopathic pulmonary fibrosis: Secondary | ICD-10-CM | POA: Diagnosis not present

## 2020-06-30 DIAGNOSIS — Z5181 Encounter for therapeutic drug level monitoring: Secondary | ICD-10-CM

## 2020-06-30 DIAGNOSIS — Z9989 Dependence on other enabling machines and devices: Secondary | ICD-10-CM

## 2020-06-30 DIAGNOSIS — G4733 Obstructive sleep apnea (adult) (pediatric): Secondary | ICD-10-CM

## 2020-06-30 DIAGNOSIS — J849 Interstitial pulmonary disease, unspecified: Secondary | ICD-10-CM

## 2020-06-30 NOTE — Addendum Note (Signed)
Addended by: Elton Sin on: 06/30/2020 09:50 AM   Modules accepted: Orders

## 2020-06-30 NOTE — Progress Notes (Signed)
Shelley [name]    [id number]    [birth year]  Primary Care Physician:Rucker, Nicole Kindred, MD  Referring Physician: Leeanne Rio, MD [address],  [place] [address]  Chief complaint: Follow-up for COPD, IPF, OSA, pulmonary hypertension  HPI: 79 year old with multiple medical issues including coronary artery disease, GERD, Crohn's disease on sulfasalazine, prior DVT, melanoma History followed by Dr. [place] Bells for COPD, IPF.  She also has group 3 pulmonary hypertension followed by Dr. Aundra Dubin, right heart cath in 2019.  Crohn's disease is treated with sulfasalazine and is stable.  Maintained on Ofev for the past 2 years.  Initially she had some GI symptoms on therapy but since has stabilized with no issues. Significant dyspnea on history stable exertion and poor exercise capacity that is unchanged  Pets: Cats, dogs.  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  No mold, hot tub, Jacuzzi.  No down pillows or comforter Smoking history: 40-pack-year smoker.  Quit in March 2019 Travel history: No significant travel history Relevant family history: Granddaughter had cystic fibrosis  Interim history: Hospitalized in July 2001 with syncope due to pulmonary hypertension, right ventricular failure.  She was started on diuretics, underwent right heart catheterization.  Managed by CHF team She is followed up with Dr. Aundra Dubin with plans to start Tyvaso  She had a break of 4 weeks with Ofev due to issues with patient assistance from foundation.  She has received alternate foundation and has resumed Ofev.  Outpatient Encounter Medications as of 06/30/2020  Medication Sig   albuterol (VENTOLIN HFA) 108 (90 Base) MCG/ACT inhaler inhale TWO puffs into THE lungs EVERY 4 HOURS AS NEEDED FOR wheezing OR SHORTNESS OF BREATH   allopurinol (ZYLOPRIM) 100 MG tablet Take  100 mg by mouth every evening.   aspirin 81 MG tablet Take 81 mg by mouth daily.     colchicine 0.6 MG tablet Take 1 tablet (0.6 mg total) by mouth daily. Needs appt   ferrous sulfate 325 (65 FE) MG tablet Take 325 mg by mouth daily.   FLUoxetine (PROZAC) 40 MG capsule Take 40 mg by mouth daily.   metoprolol succinate (TOPROL-XL) 25 MG 24 hr tablet TAKE 1 TABLET BY MOUTH EVERY DAY (Patient taking differently: Take 25 mg by mouth in the morning. )   midodrine (PROAMATINE) 10 MG tablet Take 1 tablet (10 mg total) by mouth 3 (three) times daily with meals.   mirtazapine (REMERON) 15 MG tablet Take 7.5 mg by mouth at bedtime.   Multiple Vitamin (MULTIVITAMIN WITH MINERALS) TABS tablet Take 1 tablet by mouth daily.   nitroGLYCERIN (NITROSTAT) 0.4 MG SL tablet Place 1 tablet (0.4 mg total) under the tongue every 5 (five) minutes x 3 doses as needed. (Patient taking differently: Place 0.4 mg under the tongue every 5 (five) minutes x 3 doses as needed for chest pain. )   OFEV 100 MG CAPS TAKE 1 CAPSULE TWICE A DAY (Patient taking differently: Take 100 mg by mouth in the morning and at bedtime. )   potassium chloride SA (KLOR-CON) 20 MEQ tablet TAKE 1 TABLET BY MOUTH TWICE DAILY (Patient taking differently: Take 20 mEq by mouth 2 (two) times daily. )   sulfaSALAzine (AZULFIDINE) 500 MG tablet Take 1 tablet (500 mg total) by mouth 2 (two) times daily.   XARELTO 2.5 MG TABS tablet TAKE  1 TABLET BY MOUTH TWICE DAILY   torsemide (DEMADEX) 20 MG tablet Take 4 tablets (80 mg total) by mouth daily.   [DISCONTINUED] folic acid (FOLVITE) 1 MG tablet TAKE 1 TABLET BY MOUTH EVERY DAY (Patient taking differently: Take 1 mg by mouth in the morning. )   [DISCONTINUED] Omega-3 Fatty Acids (FISH OIL) 1200 MG CAPS Take 1,200 mg by mouth 2 (two) times a week.    [DISCONTINUED] predniSONE (DELTASONE) 20 MG tablet Take 40 mg for one day, then 30 mg for one day then 20 mg for one day the 10 mg for one day, then  stop.   No facility-administered encounter medications on file as of 06/30/2020.   Physical Exam: Blood pressure 116/66, pulse 78, temperature (!) 97.2 F (36.2 C), temperature source Skin, height 5' 5" (1.651 m), SpO2 98 %. Gen:      No acute distress HEENT:  EOMI, sclera anicteric Neck:     No masses; no thyromegaly Lungs:    Bibasal crackles, CV:         Regular rate and rhythm; no murmurs Abd:      + bowel sounds; soft, non-tender; no palpable masses, no distension Ext: Trace edema; adequate peripheral perfusion Skin:      Warm and dry; no rash Neuro: alert and oriented x 3 Psych: normal mood and affect  Data Reviewed: Imaging: V/Q scan 11/05/2017-no PE High-res CT 11/30/2017-pulmonary fibrosis in UIP pattern High-res CT 10/11/2019-stable pulmonary fibrosis in UIP pattern, coronary atherosclerosis, enlarged pulmonary trunk.  PFTs: 10/29/2017 FVC 2.01 [71%], FEV1 1.28 [61%], F/F 63, TLC 4.19 [80%], DLCO 8.10 [31%] Moderate obstruction with severe diffusion impairment  07/27/2018 FVC 1.75 [62%], FEV1 1.36 [65%], F/F 78, TLC 3.78 [72%], DLCO 6.86 [26%] Mild restriction with severe diffusion impairment.  No obstruction  Labs: CTD serologies 11/12/2017-negative, hypersensitivity panel-negative  Cardiac: Right heart cath 10/30/2017 PA 54/16 (mean 28) PCWP 8,  Cardiac Output (Fick) 5.16, Cardiac Index (Fick) 2.55 PVR 3.9 WU  Echo 11/03/2018-LVEF 55 to 60%, severe pulmonary hypertension with moderately reduced RV systolic function.  Sleep: Home sleep test 03/16/2018 Moderate obstructive sleep apnea with AHI 27.8 and low O2 sat of 75%  Assessment:  IPF Continues on Ofev.  Stable on CT earlier this year Check CBC, comprehensive metabolic panel for monitoring Schedule pulmonary function test   COPD, Emphysema on CT scan Has a diagnosis of COPD with some obstruction but does not appear to be impressive with no curvature of the flow loops.  Suspect pulmonary fibrosis and pulmonary  hypertension is a major cause of her dyspnea more than COPD Continue albuterol as needed.  Pulmonary hypertension Group 3 secondary to pulmonary fibrosis, OSA Not on systemic vasodilators.  Continue Lasix Follow with Dr. Aundra Dubin  She is being evaluated for inhaled Tyvaso  OSA Download reviewed with good compliance.  Continue current therapy.  Health maintenance We will get flu vaccine today.  Does not want Covid vaccine due to personal preference.  Plan/Recommendations: Continue Ofev Continue CPAP.   Check comprehensive metabolic panel, CBC Follow-up PFTs, high-res CT  Marshell Garfinkel MD [facility] 06/30/2020, 9:31 AM  CC: Leeanne Rio, MD  Sign off

## 2020-06-30 NOTE — Patient Instructions (Signed)
We will get some labs today including comprehensive metabolic panel, CBC, N-terminal proBNP Schedule pulmonary function test Schedule high-res CT in 6 months Follow-up in clinic after CT

## 2020-06-30 NOTE — Addendum Note (Signed)
Addended by: Elton Sin on: 06/30/2020 02:48 PM   Modules accepted: Orders

## 2020-07-06 DIAGNOSIS — J449 Chronic obstructive pulmonary disease, unspecified: Secondary | ICD-10-CM | POA: Diagnosis not present

## 2020-07-06 DIAGNOSIS — I509 Heart failure, unspecified: Secondary | ICD-10-CM | POA: Diagnosis not present

## 2020-07-10 DIAGNOSIS — J9611 Chronic respiratory failure with hypoxia: Secondary | ICD-10-CM | POA: Diagnosis not present

## 2020-07-10 DIAGNOSIS — R601 Generalized edema: Secondary | ICD-10-CM | POA: Diagnosis not present

## 2020-07-10 DIAGNOSIS — R5383 Other fatigue: Secondary | ICD-10-CM | POA: Diagnosis not present

## 2020-07-10 DIAGNOSIS — J84112 Idiopathic pulmonary fibrosis: Secondary | ICD-10-CM | POA: Diagnosis not present

## 2020-07-10 DIAGNOSIS — R7302 Impaired glucose tolerance (oral): Secondary | ICD-10-CM | POA: Diagnosis not present

## 2020-07-19 DIAGNOSIS — R062 Wheezing: Secondary | ICD-10-CM | POA: Diagnosis not present

## 2020-07-19 DIAGNOSIS — R2689 Other abnormalities of gait and mobility: Secondary | ICD-10-CM | POA: Diagnosis not present

## 2020-07-19 DIAGNOSIS — I5032 Chronic diastolic (congestive) heart failure: Secondary | ICD-10-CM | POA: Diagnosis not present

## 2020-07-22 DIAGNOSIS — R2689 Other abnormalities of gait and mobility: Secondary | ICD-10-CM | POA: Diagnosis not present

## 2020-07-22 DIAGNOSIS — I5032 Chronic diastolic (congestive) heart failure: Secondary | ICD-10-CM | POA: Diagnosis not present

## 2020-07-22 DIAGNOSIS — R062 Wheezing: Secondary | ICD-10-CM | POA: Diagnosis not present

## 2020-07-23 DIAGNOSIS — R404 Transient alteration of awareness: Secondary | ICD-10-CM | POA: Diagnosis not present

## 2020-07-23 DIAGNOSIS — I499 Cardiac arrhythmia, unspecified: Secondary | ICD-10-CM | POA: Diagnosis not present

## 2020-07-26 DIAGNOSIS — 419620001 Death: Secondary | SNOMED CT | POA: Diagnosis not present

## 2020-07-26 DEATH — deceased

## 2020-08-01 ENCOUNTER — Telehealth: Payer: Self-pay | Admitting: Pulmonary Disease

## 2020-08-01 ENCOUNTER — Other Ambulatory Visit (HOSPITAL_COMMUNITY): Payer: PPO

## 2020-08-01 NOTE — Telephone Encounter (Signed)
Per chart, pt's PCP signed the death certificate and not our office. Lattie Haw would need to contact pt's PCP for further information.  PCP: Catalina Antigua  Phone: [phone]  Attempted to call Lattie Haw to provide her with this information, but her voicemail states that it does not accept incoming calls and she would be reaching out to follow up in the days to come.  Will await call back.

## 2020-08-04 ENCOUNTER — Ambulatory Visit: Payer: PPO | Admitting: Pulmonary Disease

## 2020-08-11 NOTE — Telephone Encounter (Signed)
ATC Lisa, unable to leave VM. As the original call was 10 days ago with no return call from [facility] the encounter will be closed.
